# Patient Record
Sex: Female | Born: 1940 | Race: White | Hispanic: No | Smoking: Former smoker
Health system: Southern US, Community
[De-identification: ages and names within clinical notes are randomized; demographics above are authoritative.]

## PROBLEM LIST (undated history)

## (undated) DIAGNOSIS — S82841A Displaced bimalleolar fracture of right lower leg, initial encounter for closed fracture: Secondary | ICD-10-CM

## (undated) DIAGNOSIS — K5792 Diverticulitis of intestine, part unspecified, without perforation or abscess without bleeding: Secondary | ICD-10-CM

## (undated) DIAGNOSIS — G459 Transient cerebral ischemic attack, unspecified: Secondary | ICD-10-CM

## (undated) DIAGNOSIS — A0472 Enterocolitis due to Clostridium difficile, not specified as recurrent: Secondary | ICD-10-CM

## (undated) DIAGNOSIS — M1990 Unspecified osteoarthritis, unspecified site: Secondary | ICD-10-CM

## (undated) DIAGNOSIS — Z86010 Personal history of colonic polyps: Secondary | ICD-10-CM

## (undated) DIAGNOSIS — C50919 Malignant neoplasm of unspecified site of unspecified female breast: Secondary | ICD-10-CM

## (undated) DIAGNOSIS — M8580 Other specified disorders of bone density and structure, unspecified site: Secondary | ICD-10-CM

## (undated) DIAGNOSIS — T7840XA Allergy, unspecified, initial encounter: Secondary | ICD-10-CM

## (undated) DIAGNOSIS — M19019 Primary osteoarthritis, unspecified shoulder: Secondary | ICD-10-CM

## (undated) DIAGNOSIS — K759 Inflammatory liver disease, unspecified: Secondary | ICD-10-CM

## (undated) DIAGNOSIS — F514 Sleep terrors [night terrors]: Secondary | ICD-10-CM

## (undated) DIAGNOSIS — I2510 Atherosclerotic heart disease of native coronary artery without angina pectoris: Secondary | ICD-10-CM

## (undated) DIAGNOSIS — J449 Chronic obstructive pulmonary disease, unspecified: Secondary | ICD-10-CM

## (undated) DIAGNOSIS — H8110 Benign paroxysmal vertigo, unspecified ear: Secondary | ICD-10-CM

## (undated) DIAGNOSIS — Z9981 Dependence on supplemental oxygen: Secondary | ICD-10-CM

## (undated) DIAGNOSIS — I8390 Asymptomatic varicose veins of unspecified lower extremity: Secondary | ICD-10-CM

## (undated) DIAGNOSIS — J438 Other emphysema: Secondary | ICD-10-CM

## (undated) DIAGNOSIS — I255 Ischemic cardiomyopathy: Secondary | ICD-10-CM

## (undated) DIAGNOSIS — I10 Essential (primary) hypertension: Secondary | ICD-10-CM

## (undated) DIAGNOSIS — R0902 Hypoxemia: Secondary | ICD-10-CM

## (undated) DIAGNOSIS — I639 Cerebral infarction, unspecified: Secondary | ICD-10-CM

## (undated) DIAGNOSIS — C50912 Malignant neoplasm of unspecified site of left female breast: Secondary | ICD-10-CM

## (undated) DIAGNOSIS — J45909 Unspecified asthma, uncomplicated: Secondary | ICD-10-CM

## (undated) DIAGNOSIS — K259 Gastric ulcer, unspecified as acute or chronic, without hemorrhage or perforation: Secondary | ICD-10-CM

## (undated) DIAGNOSIS — K219 Gastro-esophageal reflux disease without esophagitis: Secondary | ICD-10-CM

## (undated) DIAGNOSIS — E785 Hyperlipidemia, unspecified: Secondary | ICD-10-CM

## (undated) HISTORY — DX: Sleep terrors (night terrors): F51.4

## (undated) HISTORY — DX: Hypoxemia: R09.02

## (undated) HISTORY — DX: Chronic obstructive pulmonary disease, unspecified: J44.9

## (undated) HISTORY — DX: Unspecified osteoarthritis, unspecified site: M19.90

## (undated) HISTORY — DX: Benign paroxysmal vertigo, unspecified ear: H81.10

## (undated) HISTORY — DX: Gastric ulcer, unspecified as acute or chronic, without hemorrhage or perforation: K25.9

## (undated) HISTORY — DX: Malignant neoplasm of unspecified site of left female breast: C50.912

## (undated) HISTORY — DX: Enterocolitis due to Clostridium difficile, not specified as recurrent: A04.72

## (undated) HISTORY — DX: Cerebral infarction, unspecified: I63.9

## (undated) HISTORY — DX: Displaced bimalleolar fracture of right lower leg, initial encounter for closed fracture: S82.841A

## (undated) HISTORY — DX: Personal history of colonic polyps: Z86.010

## (undated) HISTORY — DX: Other specified disorders of bone density and structure, unspecified site: M85.80

## (undated) HISTORY — DX: Primary osteoarthritis, unspecified shoulder: M19.019

## (undated) HISTORY — DX: Diverticulitis of intestine, part unspecified, without perforation or abscess without bleeding: K57.92

## (undated) HISTORY — DX: Essential (primary) hypertension: I10

## (undated) HISTORY — DX: Atherosclerotic heart disease of native coronary artery without angina pectoris: I25.10

## (undated) HISTORY — DX: Malignant neoplasm of unspecified site of unspecified female breast: C50.919

## (undated) HISTORY — PX: ABDOMINAL HYSTERECTOMY: SHX81

## (undated) HISTORY — DX: Other emphysema: J43.8

## (undated) HISTORY — PX: NOSE SURGERY: SHX723

## (undated) HISTORY — PX: VARICOSE VEIN SURGERY: SHX832

## (undated) HISTORY — PX: OTHER SURGICAL HISTORY: SHX169

## (undated) HISTORY — DX: Hyperlipidemia, unspecified: E78.5

---
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
## 1973-10-20 HISTORY — PX: AUGMENTATION MAMMAPLASTY: SUR837

## 1979-06-21 HISTORY — PX: OTHER SURGICAL HISTORY: SHX169

## 1987-10-21 DIAGNOSIS — K759 Inflammatory liver disease, unspecified: Secondary | ICD-10-CM

## 1987-10-21 HISTORY — DX: Inflammatory liver disease, unspecified: K75.9

## 1988-10-20 HISTORY — PX: CORONARY ANGIOPLASTY: SHX604

## 1999-09-13 ENCOUNTER — Encounter: Payer: Self-pay | Admitting: Emergency Medicine

## 1999-09-14 ENCOUNTER — Encounter: Payer: Self-pay | Admitting: Cardiology

## 1999-09-14 ENCOUNTER — Inpatient Hospital Stay (HOSPITAL_COMMUNITY): Admission: EM | Admit: 1999-09-14 | Discharge: 1999-09-16 | Payer: Self-pay | Admitting: Emergency Medicine

## 1999-09-15 ENCOUNTER — Encounter: Payer: Self-pay | Admitting: Cardiology

## 2001-12-08 ENCOUNTER — Other Ambulatory Visit: Admission: RE | Admit: 2001-12-08 | Discharge: 2001-12-08 | Payer: Self-pay | Admitting: Obstetrics and Gynecology

## 2003-04-14 ENCOUNTER — Encounter: Admission: RE | Admit: 2003-04-14 | Discharge: 2003-04-14 | Payer: Self-pay | Admitting: Occupational Medicine

## 2003-04-14 ENCOUNTER — Encounter: Payer: Self-pay | Admitting: Occupational Medicine

## 2004-01-10 ENCOUNTER — Other Ambulatory Visit: Admission: RE | Admit: 2004-01-10 | Discharge: 2004-01-10 | Payer: Self-pay | Admitting: Obstetrics and Gynecology

## 2004-12-05 ENCOUNTER — Ambulatory Visit: Payer: Self-pay | Admitting: Internal Medicine

## 2005-01-09 ENCOUNTER — Encounter: Admission: RE | Admit: 2005-01-09 | Discharge: 2005-01-09 | Payer: Self-pay | Admitting: Obstetrics and Gynecology

## 2005-01-17 ENCOUNTER — Other Ambulatory Visit: Admission: RE | Admit: 2005-01-17 | Discharge: 2005-01-17 | Payer: Self-pay | Admitting: Obstetrics and Gynecology

## 2005-02-12 ENCOUNTER — Ambulatory Visit: Payer: Self-pay | Admitting: Cardiology

## 2005-02-12 ENCOUNTER — Inpatient Hospital Stay (HOSPITAL_COMMUNITY): Admission: EM | Admit: 2005-02-12 | Discharge: 2005-02-13 | Payer: Self-pay | Admitting: Emergency Medicine

## 2005-02-12 ENCOUNTER — Ambulatory Visit: Payer: Self-pay | Admitting: Internal Medicine

## 2005-02-17 ENCOUNTER — Ambulatory Visit: Payer: Self-pay

## 2005-02-26 ENCOUNTER — Ambulatory Visit: Payer: Self-pay | Admitting: Cardiology

## 2005-09-19 ENCOUNTER — Ambulatory Visit: Payer: Self-pay | Admitting: Internal Medicine

## 2005-10-20 DIAGNOSIS — Z860100 Personal history of colon polyps, unspecified: Secondary | ICD-10-CM

## 2005-10-20 HISTORY — DX: Personal history of colon polyps, unspecified: Z86.0100

## 2005-10-20 HISTORY — PX: ORIF ANKLE FRACTURE: SUR919

## 2005-10-20 HISTORY — DX: Personal history of colonic polyps: Z86.010

## 2006-01-02 ENCOUNTER — Inpatient Hospital Stay (HOSPITAL_COMMUNITY): Admission: EM | Admit: 2006-01-02 | Discharge: 2006-01-07 | Payer: Self-pay | Admitting: Emergency Medicine

## 2006-02-12 ENCOUNTER — Inpatient Hospital Stay (HOSPITAL_COMMUNITY): Admission: EM | Admit: 2006-02-12 | Discharge: 2006-02-16 | Payer: Self-pay | Admitting: Emergency Medicine

## 2006-03-13 ENCOUNTER — Ambulatory Visit (HOSPITAL_COMMUNITY): Admission: RE | Admit: 2006-03-13 | Discharge: 2006-03-13 | Payer: Self-pay | Admitting: Orthopedic Surgery

## 2006-04-03 ENCOUNTER — Ambulatory Visit: Payer: Self-pay | Admitting: Internal Medicine

## 2006-04-07 ENCOUNTER — Encounter: Admission: RE | Admit: 2006-04-07 | Discharge: 2006-04-07 | Payer: Self-pay | Admitting: Orthopedic Surgery

## 2006-05-27 ENCOUNTER — Ambulatory Visit: Payer: Self-pay | Admitting: Internal Medicine

## 2006-07-31 ENCOUNTER — Ambulatory Visit: Payer: Self-pay | Admitting: Internal Medicine

## 2006-08-04 ENCOUNTER — Ambulatory Visit: Payer: Self-pay | Admitting: Internal Medicine

## 2006-08-04 LAB — CONVERTED CEMR LAB
Basophils Absolute: 0 10*3/uL (ref 0.0–0.1)
Basophils Relative: 0.7 % (ref 0.0–1.0)
Eosinophil percent: 6 % — ABNORMAL HIGH (ref 0.0–5.0)
Glucose, Bld: 101 mg/dL — ABNORMAL HIGH (ref 70–99)
HCT: 43.1 % (ref 36.0–46.0)
Hemoglobin: 14.4 g/dL (ref 12.0–15.0)
LDL Cholesterol: 121 mg/dL — ABNORMAL HIGH (ref 0–99)
Monocytes Absolute: 0.4 10*3/uL (ref 0.2–0.7)
Neutro Abs: 2.9 10*3/uL (ref 1.4–7.7)
Neutrophils Relative %: 55 % (ref 43.0–77.0)
Platelets: 227 10*3/uL (ref 150–400)
RBC: 4.65 M/uL (ref 3.87–5.11)
Triglyceride fasting, serum: 89 mg/dL (ref 0–149)

## 2006-08-18 ENCOUNTER — Ambulatory Visit: Payer: Self-pay | Admitting: Gastroenterology

## 2006-09-23 ENCOUNTER — Encounter (INDEPENDENT_AMBULATORY_CARE_PROVIDER_SITE_OTHER): Payer: Self-pay | Admitting: Specialist

## 2006-09-23 ENCOUNTER — Ambulatory Visit: Payer: Self-pay | Admitting: Gastroenterology

## 2006-11-13 DIAGNOSIS — Z87891 Personal history of nicotine dependence: Secondary | ICD-10-CM | POA: Insufficient documentation

## 2006-11-13 DIAGNOSIS — S82899A Other fracture of unspecified lower leg, initial encounter for closed fracture: Secondary | ICD-10-CM | POA: Insufficient documentation

## 2006-11-13 DIAGNOSIS — M899 Disorder of bone, unspecified: Secondary | ICD-10-CM | POA: Insufficient documentation

## 2006-11-13 DIAGNOSIS — L089 Local infection of the skin and subcutaneous tissue, unspecified: Secondary | ICD-10-CM | POA: Insufficient documentation

## 2006-11-13 DIAGNOSIS — I2510 Atherosclerotic heart disease of native coronary artery without angina pectoris: Secondary | ICD-10-CM | POA: Insufficient documentation

## 2006-11-13 DIAGNOSIS — M949 Disorder of cartilage, unspecified: Secondary | ICD-10-CM

## 2006-11-13 DIAGNOSIS — I252 Old myocardial infarction: Secondary | ICD-10-CM | POA: Insufficient documentation

## 2006-12-16 ENCOUNTER — Encounter: Payer: Self-pay | Admitting: Internal Medicine

## 2007-02-23 ENCOUNTER — Ambulatory Visit: Payer: Self-pay | Admitting: Cardiology

## 2007-03-05 ENCOUNTER — Ambulatory Visit: Payer: Self-pay

## 2007-03-05 LAB — CONVERTED CEMR LAB
ALT: 27 units/L (ref 0–40)
AST: 24 units/L (ref 0–37)
Albumin: 3.7 g/dL (ref 3.5–5.2)
Alkaline Phosphatase: 84 units/L (ref 39–117)
CO2: 27 meq/L (ref 19–32)
Calcium: 8.9 mg/dL (ref 8.4–10.5)
GFR calc non Af Amer: 106 mL/min
LDL Cholesterol: 81 mg/dL (ref 0–99)
Potassium: 3.9 meq/L (ref 3.5–5.1)
Pro B Natriuretic peptide (BNP): 47 pg/mL (ref 0.0–100.0)
Sodium: 141 meq/L (ref 135–145)
TSH: 0.55 microintl units/mL (ref 0.35–5.50)
Total Protein: 6.1 g/dL (ref 6.0–8.3)
VLDL: 10 mg/dL (ref 0–40)

## 2007-06-08 ENCOUNTER — Telehealth (INDEPENDENT_AMBULATORY_CARE_PROVIDER_SITE_OTHER): Payer: Self-pay | Admitting: *Deleted

## 2007-06-11 ENCOUNTER — Ambulatory Visit: Payer: Self-pay | Admitting: Internal Medicine

## 2007-06-11 DIAGNOSIS — L0291 Cutaneous abscess, unspecified: Secondary | ICD-10-CM

## 2007-06-11 DIAGNOSIS — L0390 Cellulitis, unspecified: Secondary | ICD-10-CM

## 2007-06-11 DIAGNOSIS — B957 Other staphylococcus as the cause of diseases classified elsewhere: Secondary | ICD-10-CM | POA: Insufficient documentation

## 2007-06-12 ENCOUNTER — Inpatient Hospital Stay (HOSPITAL_COMMUNITY): Admission: RE | Admit: 2007-06-12 | Discharge: 2007-06-13 | Payer: Self-pay | Admitting: General Surgery

## 2007-07-07 ENCOUNTER — Encounter: Payer: Self-pay | Admitting: Internal Medicine

## 2007-07-07 ENCOUNTER — Ambulatory Visit: Payer: Self-pay | Admitting: Internal Medicine

## 2007-07-16 ENCOUNTER — Encounter (INDEPENDENT_AMBULATORY_CARE_PROVIDER_SITE_OTHER): Payer: Self-pay | Admitting: *Deleted

## 2007-10-11 ENCOUNTER — Ambulatory Visit: Payer: Self-pay | Admitting: Internal Medicine

## 2007-10-11 DIAGNOSIS — J449 Chronic obstructive pulmonary disease, unspecified: Secondary | ICD-10-CM

## 2007-10-12 ENCOUNTER — Telehealth (INDEPENDENT_AMBULATORY_CARE_PROVIDER_SITE_OTHER): Payer: Self-pay | Admitting: *Deleted

## 2007-12-03 ENCOUNTER — Ambulatory Visit: Payer: Self-pay | Admitting: Internal Medicine

## 2007-12-03 DIAGNOSIS — H60399 Other infective otitis externa, unspecified ear: Secondary | ICD-10-CM | POA: Insufficient documentation

## 2008-01-11 ENCOUNTER — Encounter: Admission: RE | Admit: 2008-01-11 | Discharge: 2008-01-11 | Payer: Self-pay | Admitting: Obstetrics & Gynecology

## 2008-01-24 ENCOUNTER — Telehealth (INDEPENDENT_AMBULATORY_CARE_PROVIDER_SITE_OTHER): Payer: Self-pay | Admitting: *Deleted

## 2008-01-31 ENCOUNTER — Telehealth (INDEPENDENT_AMBULATORY_CARE_PROVIDER_SITE_OTHER): Payer: Self-pay | Admitting: *Deleted

## 2008-02-04 ENCOUNTER — Ambulatory Visit: Payer: Self-pay | Admitting: Internal Medicine

## 2008-03-10 ENCOUNTER — Encounter: Payer: Self-pay | Admitting: Internal Medicine

## 2008-04-20 ENCOUNTER — Encounter: Payer: Self-pay | Admitting: Emergency Medicine

## 2008-04-21 ENCOUNTER — Inpatient Hospital Stay (HOSPITAL_COMMUNITY): Admission: EM | Admit: 2008-04-21 | Discharge: 2008-04-22 | Payer: Self-pay | Admitting: Neurology

## 2008-04-21 ENCOUNTER — Encounter (INDEPENDENT_AMBULATORY_CARE_PROVIDER_SITE_OTHER): Payer: Self-pay | Admitting: Neurology

## 2008-04-24 ENCOUNTER — Encounter (INDEPENDENT_AMBULATORY_CARE_PROVIDER_SITE_OTHER): Payer: Self-pay | Admitting: Neurology

## 2008-04-24 ENCOUNTER — Ambulatory Visit: Payer: Self-pay | Admitting: Cardiology

## 2008-04-24 ENCOUNTER — Ambulatory Visit (HOSPITAL_COMMUNITY): Admission: RE | Admit: 2008-04-24 | Discharge: 2008-04-24 | Payer: Self-pay | Admitting: Neurology

## 2008-06-05 ENCOUNTER — Ambulatory Visit: Payer: Self-pay

## 2008-06-07 ENCOUNTER — Ambulatory Visit: Payer: Self-pay | Admitting: Cardiovascular Disease

## 2008-06-07 ENCOUNTER — Ambulatory Visit (HOSPITAL_COMMUNITY): Admission: RE | Admit: 2008-06-07 | Discharge: 2008-06-07 | Payer: Self-pay | Admitting: Cardiovascular Disease

## 2008-06-08 ENCOUNTER — Ambulatory Visit (HOSPITAL_COMMUNITY): Admission: RE | Admit: 2008-06-08 | Discharge: 2008-06-08 | Payer: Self-pay | Admitting: Cardiovascular Disease

## 2008-06-13 ENCOUNTER — Ambulatory Visit (HOSPITAL_COMMUNITY): Admission: RE | Admit: 2008-06-13 | Discharge: 2008-06-13 | Payer: Self-pay | Admitting: Cardiovascular Disease

## 2008-06-13 ENCOUNTER — Ambulatory Visit: Payer: Self-pay | Admitting: Cardiovascular Disease

## 2008-06-14 ENCOUNTER — Ambulatory Visit: Payer: Self-pay | Admitting: Cardiology

## 2008-06-14 ENCOUNTER — Ambulatory Visit: Payer: Self-pay | Admitting: Cardiovascular Disease

## 2008-06-20 ENCOUNTER — Encounter: Payer: Self-pay | Admitting: Internal Medicine

## 2008-06-20 ENCOUNTER — Ambulatory Visit: Payer: Self-pay

## 2008-06-20 ENCOUNTER — Encounter: Payer: Self-pay | Admitting: Cardiovascular Disease

## 2008-08-11 ENCOUNTER — Telehealth: Payer: Self-pay | Admitting: Internal Medicine

## 2008-08-21 ENCOUNTER — Telehealth (INDEPENDENT_AMBULATORY_CARE_PROVIDER_SITE_OTHER): Payer: Self-pay | Admitting: *Deleted

## 2008-08-21 ENCOUNTER — Inpatient Hospital Stay (HOSPITAL_COMMUNITY): Admission: EM | Admit: 2008-08-21 | Discharge: 2008-08-25 | Payer: Self-pay | Admitting: Emergency Medicine

## 2008-08-21 ENCOUNTER — Ambulatory Visit: Payer: Self-pay | Admitting: Internal Medicine

## 2008-08-23 ENCOUNTER — Ambulatory Visit: Payer: Self-pay | Admitting: Internal Medicine

## 2008-08-23 ENCOUNTER — Encounter: Payer: Self-pay | Admitting: Internal Medicine

## 2008-08-24 ENCOUNTER — Encounter: Payer: Self-pay | Admitting: Internal Medicine

## 2008-08-25 ENCOUNTER — Encounter: Payer: Self-pay | Admitting: Internal Medicine

## 2008-09-01 ENCOUNTER — Telehealth (INDEPENDENT_AMBULATORY_CARE_PROVIDER_SITE_OTHER): Payer: Self-pay | Admitting: *Deleted

## 2008-09-01 ENCOUNTER — Ambulatory Visit: Payer: Self-pay | Admitting: Internal Medicine

## 2008-09-01 DIAGNOSIS — Z8719 Personal history of other diseases of the digestive system: Secondary | ICD-10-CM

## 2008-09-01 DIAGNOSIS — B379 Candidiasis, unspecified: Secondary | ICD-10-CM | POA: Insufficient documentation

## 2008-09-01 DIAGNOSIS — Z8679 Personal history of other diseases of the circulatory system: Secondary | ICD-10-CM | POA: Insufficient documentation

## 2008-09-01 DIAGNOSIS — M19019 Primary osteoarthritis, unspecified shoulder: Secondary | ICD-10-CM | POA: Insufficient documentation

## 2008-09-01 DIAGNOSIS — K259 Gastric ulcer, unspecified as acute or chronic, without hemorrhage or perforation: Secondary | ICD-10-CM | POA: Insufficient documentation

## 2008-09-06 ENCOUNTER — Encounter (INDEPENDENT_AMBULATORY_CARE_PROVIDER_SITE_OTHER): Payer: Self-pay | Admitting: *Deleted

## 2008-09-15 ENCOUNTER — Encounter (INDEPENDENT_AMBULATORY_CARE_PROVIDER_SITE_OTHER): Payer: Self-pay | Admitting: *Deleted

## 2008-09-15 DIAGNOSIS — Z860100 Personal history of colon polyps, unspecified: Secondary | ICD-10-CM | POA: Insufficient documentation

## 2008-09-15 DIAGNOSIS — K5730 Diverticulosis of large intestine without perforation or abscess without bleeding: Secondary | ICD-10-CM | POA: Insufficient documentation

## 2008-09-15 DIAGNOSIS — K251 Acute gastric ulcer with perforation: Secondary | ICD-10-CM | POA: Insufficient documentation

## 2008-09-19 ENCOUNTER — Ambulatory Visit: Payer: Self-pay | Admitting: Internal Medicine

## 2008-09-21 ENCOUNTER — Ambulatory Visit: Payer: Self-pay | Admitting: Internal Medicine

## 2008-09-21 DIAGNOSIS — J441 Chronic obstructive pulmonary disease with (acute) exacerbation: Secondary | ICD-10-CM | POA: Insufficient documentation

## 2008-09-22 ENCOUNTER — Ambulatory Visit: Payer: Self-pay | Admitting: Gastroenterology

## 2008-10-17 ENCOUNTER — Telehealth: Payer: Self-pay | Admitting: Gastroenterology

## 2008-10-20 HISTORY — PX: CORONARY ANGIOPLASTY WITH STENT PLACEMENT: SHX49

## 2008-10-31 ENCOUNTER — Telehealth: Payer: Self-pay | Admitting: Gastroenterology

## 2008-11-01 ENCOUNTER — Ambulatory Visit: Payer: Self-pay | Admitting: Gastroenterology

## 2008-11-01 DIAGNOSIS — A09 Infectious gastroenteritis and colitis, unspecified: Secondary | ICD-10-CM | POA: Insufficient documentation

## 2008-11-01 DIAGNOSIS — R197 Diarrhea, unspecified: Secondary | ICD-10-CM

## 2008-11-01 DIAGNOSIS — R1084 Generalized abdominal pain: Secondary | ICD-10-CM | POA: Insufficient documentation

## 2008-11-01 LAB — CONVERTED CEMR LAB
BUN: 36 mg/dL — ABNORMAL HIGH (ref 6–23)
Basophils Absolute: 0 10*3/uL (ref 0.0–0.1)
Basophils Relative: 0.6 % (ref 0.0–3.0)
CO2: 30 meq/L (ref 19–32)
Creatinine, Ser: 0.9 mg/dL (ref 0.4–1.2)
GFR calc non Af Amer: 66 mL/min
Glucose, Bld: 102 mg/dL — ABNORMAL HIGH (ref 70–99)
HCT: 42.1 % (ref 36.0–46.0)
Monocytes Relative: 7.9 % (ref 3.0–12.0)
Neutro Abs: 5.2 10*3/uL (ref 1.4–7.7)
Neutrophils Relative %: 67.4 % (ref 43.0–77.0)
Potassium: 5 meq/L (ref 3.5–5.1)
Sodium: 142 meq/L (ref 135–145)

## 2008-11-10 ENCOUNTER — Encounter: Payer: Self-pay | Admitting: Physician Assistant

## 2008-11-29 ENCOUNTER — Encounter (INDEPENDENT_AMBULATORY_CARE_PROVIDER_SITE_OTHER): Payer: Self-pay | Admitting: *Deleted

## 2009-04-24 ENCOUNTER — Telehealth: Payer: Self-pay | Admitting: Cardiology

## 2009-04-24 ENCOUNTER — Inpatient Hospital Stay (HOSPITAL_COMMUNITY): Admission: EM | Admit: 2009-04-24 | Discharge: 2009-04-28 | Payer: Self-pay | Admitting: Emergency Medicine

## 2009-04-24 ENCOUNTER — Ambulatory Visit: Payer: Self-pay | Admitting: Cardiovascular Disease

## 2009-04-25 ENCOUNTER — Encounter: Payer: Self-pay | Admitting: Cardiovascular Disease

## 2009-04-26 ENCOUNTER — Encounter: Payer: Self-pay | Admitting: Cardiology

## 2009-04-26 ENCOUNTER — Ambulatory Visit: Payer: Self-pay | Admitting: Cardiovascular Disease

## 2009-04-26 ENCOUNTER — Ambulatory Visit: Payer: Self-pay | Admitting: Surgery

## 2009-04-27 ENCOUNTER — Other Ambulatory Visit: Payer: Self-pay | Admitting: Cardiovascular Disease

## 2009-04-27 ENCOUNTER — Encounter: Payer: Self-pay | Admitting: Cardiology

## 2009-04-28 ENCOUNTER — Encounter: Payer: Self-pay | Admitting: Cardiology

## 2009-04-28 ENCOUNTER — Other Ambulatory Visit: Payer: Self-pay | Admitting: Cardiovascular Disease

## 2009-04-30 ENCOUNTER — Telehealth: Payer: Self-pay | Admitting: Cardiology

## 2009-05-03 ENCOUNTER — Encounter: Payer: Self-pay | Admitting: Cardiology

## 2009-05-03 ENCOUNTER — Encounter (HOSPITAL_COMMUNITY): Admission: RE | Admit: 2009-05-03 | Discharge: 2009-08-01 | Payer: Self-pay | Admitting: Cardiology

## 2009-05-07 ENCOUNTER — Telehealth (INDEPENDENT_AMBULATORY_CARE_PROVIDER_SITE_OTHER): Payer: Self-pay | Admitting: *Deleted

## 2009-05-07 ENCOUNTER — Ambulatory Visit: Payer: Self-pay | Admitting: Cardiology

## 2009-05-07 DIAGNOSIS — E785 Hyperlipidemia, unspecified: Secondary | ICD-10-CM

## 2009-05-08 ENCOUNTER — Encounter: Admission: RE | Admit: 2009-05-08 | Discharge: 2009-05-08 | Payer: Self-pay | Admitting: Internal Medicine

## 2009-05-09 ENCOUNTER — Encounter: Payer: Self-pay | Admitting: Cardiology

## 2009-05-09 ENCOUNTER — Telehealth: Payer: Self-pay | Admitting: Cardiology

## 2009-05-14 ENCOUNTER — Telehealth (INDEPENDENT_AMBULATORY_CARE_PROVIDER_SITE_OTHER): Payer: Self-pay | Admitting: *Deleted

## 2009-05-17 ENCOUNTER — Telehealth: Payer: Self-pay | Admitting: Cardiology

## 2009-05-21 ENCOUNTER — Telehealth (INDEPENDENT_AMBULATORY_CARE_PROVIDER_SITE_OTHER): Payer: Self-pay | Admitting: *Deleted

## 2009-05-23 ENCOUNTER — Encounter: Payer: Self-pay | Admitting: Internal Medicine

## 2009-06-07 ENCOUNTER — Encounter: Payer: Self-pay | Admitting: Cardiology

## 2009-06-18 ENCOUNTER — Telehealth (INDEPENDENT_AMBULATORY_CARE_PROVIDER_SITE_OTHER): Payer: Self-pay | Admitting: *Deleted

## 2009-06-28 ENCOUNTER — Telehealth: Payer: Self-pay | Admitting: Cardiology

## 2009-07-24 ENCOUNTER — Ambulatory Visit: Payer: Self-pay | Admitting: Cardiology

## 2009-07-24 DIAGNOSIS — I6350 Cerebral infarction due to unspecified occlusion or stenosis of unspecified cerebral artery: Secondary | ICD-10-CM | POA: Insufficient documentation

## 2009-07-25 ENCOUNTER — Ambulatory Visit: Payer: Self-pay | Admitting: Pulmonary Disease

## 2009-07-25 ENCOUNTER — Encounter: Payer: Self-pay | Admitting: Pulmonary Disease

## 2009-07-25 DIAGNOSIS — R0902 Hypoxemia: Secondary | ICD-10-CM | POA: Insufficient documentation

## 2009-08-08 ENCOUNTER — Encounter (HOSPITAL_COMMUNITY): Admission: RE | Admit: 2009-08-08 | Discharge: 2009-10-17 | Payer: Self-pay | Admitting: Cardiology

## 2009-08-10 ENCOUNTER — Telehealth (INDEPENDENT_AMBULATORY_CARE_PROVIDER_SITE_OTHER): Payer: Self-pay | Admitting: *Deleted

## 2009-08-21 ENCOUNTER — Encounter: Payer: Self-pay | Admitting: Pulmonary Disease

## 2009-08-27 ENCOUNTER — Telehealth (INDEPENDENT_AMBULATORY_CARE_PROVIDER_SITE_OTHER): Payer: Self-pay | Admitting: *Deleted

## 2009-08-27 ENCOUNTER — Ambulatory Visit: Payer: Self-pay | Admitting: Pulmonary Disease

## 2009-08-27 LAB — CONVERTED CEMR LAB
Albumin: 3.7 g/dL (ref 3.5–5.2)
Alkaline Phosphatase: 80 units/L (ref 39–117)
BUN: 16 mg/dL (ref 6–23)
Basophils Relative: 0.5 % (ref 0.0–3.0)
Calcium: 9.7 mg/dL (ref 8.4–10.5)
Chloride: 104 meq/L (ref 96–112)
Creatinine, Ser: 1 mg/dL (ref 0.4–1.2)
Eosinophils Absolute: 0.5 10*3/uL (ref 0.0–0.7)
Eosinophils Relative: 5.7 % — ABNORMAL HIGH (ref 0.0–5.0)
GFR calc non Af Amer: 58.51 mL/min (ref >60)
Hemoglobin: 13.8 g/dL (ref 12.0–15.0)
Lymphocytes Relative: 19.9 % (ref 12.0–46.0)
Lymphs Abs: 1.6 10*3/uL (ref 0.7–4.0)
MCV: 93.6 fL (ref 78.0–100.0)
Monocytes Absolute: 0.8 10*3/uL (ref 0.1–1.0)
Neutro Abs: 5.2 10*3/uL (ref 1.4–7.7)
Neutrophils Relative %: 64.3 % (ref 43.0–77.0)
Potassium: 4.3 meq/L (ref 3.5–5.1)
RBC: 4.29 M/uL (ref 3.87–5.11)
RDW: 11.6 % (ref 11.5–14.6)
Sodium: 142 meq/L (ref 135–145)
Total Bilirubin: 0.8 mg/dL (ref 0.3–1.2)

## 2009-08-29 ENCOUNTER — Telehealth (INDEPENDENT_AMBULATORY_CARE_PROVIDER_SITE_OTHER): Payer: Self-pay | Admitting: *Deleted

## 2009-08-30 ENCOUNTER — Encounter: Payer: Self-pay | Admitting: Pulmonary Disease

## 2009-09-05 ENCOUNTER — Encounter: Payer: Self-pay | Admitting: Cardiology

## 2009-09-20 ENCOUNTER — Encounter: Payer: Self-pay | Admitting: Internal Medicine

## 2009-09-27 ENCOUNTER — Telehealth: Payer: Self-pay | Admitting: Pulmonary Disease

## 2009-10-03 ENCOUNTER — Telehealth (INDEPENDENT_AMBULATORY_CARE_PROVIDER_SITE_OTHER): Payer: Self-pay | Admitting: *Deleted

## 2009-10-10 ENCOUNTER — Telehealth: Payer: Self-pay | Admitting: Pulmonary Disease

## 2009-10-24 ENCOUNTER — Ambulatory Visit: Payer: Self-pay | Admitting: Internal Medicine

## 2009-11-04 ENCOUNTER — Inpatient Hospital Stay (HOSPITAL_COMMUNITY): Admission: EM | Admit: 2009-11-04 | Discharge: 2009-11-08 | Payer: Self-pay | Admitting: Emergency Medicine

## 2009-11-05 ENCOUNTER — Encounter (INDEPENDENT_AMBULATORY_CARE_PROVIDER_SITE_OTHER): Payer: Self-pay | Admitting: *Deleted

## 2009-11-07 ENCOUNTER — Telehealth: Payer: Self-pay | Admitting: Gastroenterology

## 2009-11-09 ENCOUNTER — Ambulatory Visit: Payer: Self-pay | Admitting: Pulmonary Disease

## 2009-11-15 ENCOUNTER — Telehealth: Payer: Self-pay | Admitting: Pulmonary Disease

## 2009-11-15 ENCOUNTER — Ambulatory Visit: Payer: Self-pay | Admitting: Gastroenterology

## 2009-11-15 DIAGNOSIS — K5732 Diverticulitis of large intestine without perforation or abscess without bleeding: Secondary | ICD-10-CM

## 2009-11-26 ENCOUNTER — Encounter: Payer: Self-pay | Admitting: Pulmonary Disease

## 2009-11-28 ENCOUNTER — Encounter (INDEPENDENT_AMBULATORY_CARE_PROVIDER_SITE_OTHER): Payer: Self-pay | Admitting: *Deleted

## 2009-11-28 ENCOUNTER — Ambulatory Visit: Payer: Self-pay | Admitting: Cardiology

## 2009-11-28 ENCOUNTER — Encounter: Payer: Self-pay | Admitting: Internal Medicine

## 2009-11-28 DIAGNOSIS — R0602 Shortness of breath: Secondary | ICD-10-CM

## 2009-11-29 ENCOUNTER — Telehealth: Payer: Self-pay | Admitting: Cardiology

## 2009-11-29 ENCOUNTER — Ambulatory Visit: Payer: Self-pay | Admitting: Cardiology

## 2009-11-29 DIAGNOSIS — R079 Chest pain, unspecified: Secondary | ICD-10-CM

## 2009-11-30 LAB — CONVERTED CEMR LAB
BUN: 25 mg/dL — ABNORMAL HIGH (ref 6–23)
Basophils Absolute: 0 10*3/uL (ref 0.0–0.1)
Basophils Relative: 0 % (ref 0.0–3.0)
Glucose, Bld: 99 mg/dL (ref 70–99)
Hemoglobin: 13.8 g/dL (ref 12.0–15.0)
Monocytes Relative: 4.8 % (ref 3.0–12.0)
Neutro Abs: 5 10*3/uL (ref 1.4–7.7)
Neutrophils Relative %: 67.3 % (ref 43.0–77.0)
RBC: 4.45 M/uL (ref 3.87–5.11)
RDW: 12.2 % (ref 11.5–14.6)

## 2009-12-04 ENCOUNTER — Ambulatory Visit: Admission: RE | Admit: 2009-12-04 | Payer: Self-pay | Source: Home / Self Care | Admitting: Cardiology

## 2009-12-04 ENCOUNTER — Ambulatory Visit: Payer: Self-pay | Admitting: Cardiology

## 2009-12-12 ENCOUNTER — Ambulatory Visit: Payer: Self-pay | Admitting: Pulmonary Disease

## 2009-12-12 ENCOUNTER — Telehealth: Payer: Self-pay | Admitting: Pulmonary Disease

## 2009-12-13 ENCOUNTER — Telehealth: Payer: Self-pay | Admitting: Cardiology

## 2009-12-14 ENCOUNTER — Ambulatory Visit: Payer: Self-pay | Admitting: Gastroenterology

## 2009-12-27 ENCOUNTER — Ambulatory Visit: Payer: Self-pay | Admitting: Pulmonary Disease

## 2009-12-27 ENCOUNTER — Telehealth: Payer: Self-pay | Admitting: Pulmonary Disease

## 2009-12-29 ENCOUNTER — Telehealth: Payer: Self-pay | Admitting: Pulmonary Disease

## 2010-01-03 ENCOUNTER — Telehealth (INDEPENDENT_AMBULATORY_CARE_PROVIDER_SITE_OTHER): Payer: Self-pay | Admitting: *Deleted

## 2010-01-04 ENCOUNTER — Telehealth (INDEPENDENT_AMBULATORY_CARE_PROVIDER_SITE_OTHER): Payer: Self-pay | Admitting: *Deleted

## 2010-01-04 ENCOUNTER — Encounter: Payer: Self-pay | Admitting: Internal Medicine

## 2010-01-15 ENCOUNTER — Ambulatory Visit: Payer: Self-pay | Admitting: Cardiology

## 2010-01-15 DIAGNOSIS — I2510 Atherosclerotic heart disease of native coronary artery without angina pectoris: Secondary | ICD-10-CM

## 2010-01-16 ENCOUNTER — Ambulatory Visit: Payer: Self-pay | Admitting: Internal Medicine

## 2010-01-16 ENCOUNTER — Telehealth: Payer: Self-pay | Admitting: Cardiology

## 2010-01-16 ENCOUNTER — Ambulatory Visit: Payer: Self-pay | Admitting: Pulmonary Disease

## 2010-01-17 LAB — CONVERTED CEMR LAB: Vit D, 25-Hydroxy: 45 ng/mL (ref 30–89)

## 2010-01-21 ENCOUNTER — Telehealth (INDEPENDENT_AMBULATORY_CARE_PROVIDER_SITE_OTHER): Payer: Self-pay | Admitting: *Deleted

## 2010-01-28 ENCOUNTER — Telehealth: Payer: Self-pay | Admitting: Pulmonary Disease

## 2010-02-05 ENCOUNTER — Telehealth: Payer: Self-pay | Admitting: Pulmonary Disease

## 2010-03-25 ENCOUNTER — Ambulatory Visit: Payer: Self-pay | Admitting: Pulmonary Disease

## 2010-03-25 DIAGNOSIS — J209 Acute bronchitis, unspecified: Secondary | ICD-10-CM

## 2010-03-25 DIAGNOSIS — J438 Other emphysema: Secondary | ICD-10-CM | POA: Insufficient documentation

## 2010-04-01 ENCOUNTER — Telehealth (INDEPENDENT_AMBULATORY_CARE_PROVIDER_SITE_OTHER): Payer: Self-pay | Admitting: *Deleted

## 2010-04-03 ENCOUNTER — Ambulatory Visit: Payer: Self-pay | Admitting: Pulmonary Disease

## 2010-05-14 ENCOUNTER — Ambulatory Visit: Payer: Self-pay | Admitting: Cardiology

## 2010-05-16 ENCOUNTER — Telehealth: Payer: Self-pay | Admitting: Gastroenterology

## 2010-05-21 ENCOUNTER — Observation Stay (HOSPITAL_COMMUNITY): Admission: EM | Admit: 2010-05-21 | Discharge: 2010-05-23 | Payer: Self-pay | Admitting: Emergency Medicine

## 2010-05-21 ENCOUNTER — Ambulatory Visit: Payer: Self-pay | Admitting: Surgery

## 2010-05-21 ENCOUNTER — Encounter (INDEPENDENT_AMBULATORY_CARE_PROVIDER_SITE_OTHER): Payer: Self-pay | Admitting: Internal Medicine

## 2010-05-21 ENCOUNTER — Ambulatory Visit: Payer: Self-pay | Admitting: Cardiovascular Disease

## 2010-05-22 ENCOUNTER — Encounter (INDEPENDENT_AMBULATORY_CARE_PROVIDER_SITE_OTHER): Payer: Self-pay | Admitting: Internal Medicine

## 2010-05-23 ENCOUNTER — Encounter (INDEPENDENT_AMBULATORY_CARE_PROVIDER_SITE_OTHER): Payer: Self-pay | Admitting: *Deleted

## 2010-05-24 ENCOUNTER — Telehealth (INDEPENDENT_AMBULATORY_CARE_PROVIDER_SITE_OTHER): Payer: Self-pay | Admitting: *Deleted

## 2010-05-24 DIAGNOSIS — H8110 Benign paroxysmal vertigo, unspecified ear: Secondary | ICD-10-CM

## 2010-05-31 ENCOUNTER — Telehealth: Payer: Self-pay | Admitting: Cardiology

## 2010-06-04 ENCOUNTER — Telehealth: Payer: Self-pay | Admitting: Cardiology

## 2010-06-11 ENCOUNTER — Encounter: Admission: RE | Admit: 2010-06-11 | Discharge: 2010-06-11 | Payer: Self-pay | Admitting: Internal Medicine

## 2010-06-26 ENCOUNTER — Encounter: Payer: Self-pay | Admitting: Internal Medicine

## 2010-07-26 ENCOUNTER — Telehealth (INDEPENDENT_AMBULATORY_CARE_PROVIDER_SITE_OTHER): Payer: Self-pay | Admitting: *Deleted

## 2010-07-30 ENCOUNTER — Telehealth: Payer: Self-pay | Admitting: Pulmonary Disease

## 2010-08-01 ENCOUNTER — Encounter: Payer: Self-pay | Admitting: Internal Medicine

## 2010-08-16 ENCOUNTER — Encounter: Payer: Self-pay | Admitting: Internal Medicine

## 2010-08-23 ENCOUNTER — Telehealth: Payer: Self-pay | Admitting: Cardiology

## 2010-08-30 ENCOUNTER — Ambulatory Visit: Payer: Self-pay | Admitting: Pulmonary Disease

## 2010-09-16 ENCOUNTER — Telehealth: Payer: Self-pay | Admitting: Gastroenterology

## 2010-09-17 ENCOUNTER — Ambulatory Visit: Payer: Self-pay | Admitting: Gastroenterology

## 2010-09-17 ENCOUNTER — Ambulatory Visit: Payer: Self-pay | Admitting: Cardiology

## 2010-09-17 DIAGNOSIS — R1032 Left lower quadrant pain: Secondary | ICD-10-CM

## 2010-09-17 LAB — CONVERTED CEMR LAB
ALT: 20 units/L (ref 0–35)
AST: 19 units/L (ref 0–37)
Albumin: 3.7 g/dL (ref 3.5–5.2)
Basophils Relative: 0.4 % (ref 0.0–3.0)
Bilirubin, Direct: 0.2 mg/dL (ref 0.0–0.3)
CO2: 25 meq/L (ref 19–32)
CRP, High Sensitivity: 151.38 — ABNORMAL HIGH (ref 0.00–5.00)
Calcium: 9.1 mg/dL (ref 8.4–10.5)
Creatinine, Ser: 0.8 mg/dL (ref 0.4–1.2)
GFR calc non Af Amer: 81.3 mL/min (ref >60)
Glucose, Bld: 96 mg/dL (ref 70–99)
Hemoglobin, Urine: NEGATIVE
Hemoglobin: 13.7 g/dL (ref 12.0–15.0)
Iron: 27 ug/dL — ABNORMAL LOW (ref 42–145)
Leukocytes, UA: NEGATIVE
MCHC: 34.4 g/dL (ref 30.0–36.0)
MCV: 92.8 fL (ref 78.0–100.0)
Neutro Abs: 9.8 10*3/uL — ABNORMAL HIGH (ref 1.4–7.7)
Neutrophils Relative %: 79.9 % — ABNORMAL HIGH (ref 43.0–77.0)
Nitrite: NEGATIVE
Platelets: 208 10*3/uL (ref 150.0–400.0)
Potassium: 4.2 meq/L (ref 3.5–5.1)
RBC: 4.3 M/uL (ref 3.87–5.11)
RDW: 12.7 % (ref 11.5–14.6)
Sed Rate: 41 mm/hr — ABNORMAL HIGH (ref 0–22)
Specific Gravity, Urine: 1.025 (ref 1.000–1.030)
Total Bilirubin: 0.6 mg/dL (ref 0.3–1.2)
Total Protein: 6.3 g/dL (ref 6.0–8.3)
Transferrin: 213.7 mg/dL (ref 212.0–360.0)
Urine Glucose: NEGATIVE mg/dL
Urobilinogen, UA: 0.2 (ref 0.0–1.0)
Vitamin B-12: 949 pg/mL — ABNORMAL HIGH (ref 211–911)
WBC: 12.3 10*3/uL — ABNORMAL HIGH (ref 4.5–10.5)
pH: 5.5 (ref 5.0–8.0)

## 2010-09-18 ENCOUNTER — Encounter: Payer: Self-pay | Admitting: Gastroenterology

## 2010-09-20 ENCOUNTER — Telehealth (INDEPENDENT_AMBULATORY_CARE_PROVIDER_SITE_OTHER): Payer: Self-pay | Admitting: *Deleted

## 2010-09-24 ENCOUNTER — Ambulatory Visit: Payer: Self-pay | Admitting: Cardiology

## 2010-09-24 ENCOUNTER — Encounter: Payer: Self-pay | Admitting: Cardiology

## 2010-09-24 DIAGNOSIS — I2589 Other forms of chronic ischemic heart disease: Secondary | ICD-10-CM | POA: Insufficient documentation

## 2010-10-06 ENCOUNTER — Emergency Department (HOSPITAL_COMMUNITY)
Admission: EM | Admit: 2010-10-06 | Discharge: 2010-10-06 | Payer: Self-pay | Source: Home / Self Care | Admitting: Emergency Medicine

## 2010-10-20 HISTORY — PX: TOTAL SHOULDER ARTHROPLASTY: SHX126

## 2010-10-24 ENCOUNTER — Ambulatory Visit (HOSPITAL_COMMUNITY)
Admission: RE | Admit: 2010-10-24 | Discharge: 2010-10-24 | Payer: Self-pay | Source: Home / Self Care | Attending: Orthopedic Surgery | Admitting: Orthopedic Surgery

## 2010-10-24 ENCOUNTER — Telehealth (INDEPENDENT_AMBULATORY_CARE_PROVIDER_SITE_OTHER): Payer: Self-pay | Admitting: *Deleted

## 2010-10-24 ENCOUNTER — Encounter: Payer: Self-pay | Admitting: Internal Medicine

## 2010-10-24 LAB — URINALYSIS, ROUTINE W REFLEX MICROSCOPIC
Bilirubin Urine: NEGATIVE
Hemoglobin, Urine: NEGATIVE
Ketones, ur: NEGATIVE mg/dL
Nitrite: NEGATIVE
Protein, ur: NEGATIVE mg/dL
Specific Gravity, Urine: 1.023 (ref 1.005–1.030)
Urine Glucose, Fasting: NEGATIVE mg/dL
Urobilinogen, UA: 0.2 mg/dL (ref 0.0–1.0)
pH: 6 (ref 5.0–8.0)

## 2010-10-24 LAB — COMPREHENSIVE METABOLIC PANEL
ALT: 29 U/L (ref 0–35)
AST: 26 U/L (ref 0–37)
Albumin: 3.5 g/dL (ref 3.5–5.2)
Alkaline Phosphatase: 128 U/L — ABNORMAL HIGH (ref 39–117)
BUN: 18 mg/dL (ref 6–23)
CO2: 27 mEq/L (ref 19–32)
Calcium: 9.3 mg/dL (ref 8.4–10.5)
Chloride: 110 mEq/L (ref 96–112)
Creatinine, Ser: 0.76 mg/dL (ref 0.4–1.2)
GFR calc Af Amer: >60 mL/min (ref >60)
GFR calc non Af Amer: >60 mL/min (ref >60)
Glucose, Bld: 166 mg/dL — ABNORMAL HIGH (ref 70–99)
Potassium: 4.1 mEq/L (ref 3.5–5.1)
Sodium: 144 mEq/L (ref 135–145)
Total Bilirubin: 0.3 mg/dL (ref 0.3–1.2)
Total Protein: 5.9 g/dL — ABNORMAL LOW (ref 6.0–8.3)

## 2010-10-24 LAB — CBC
HCT: 41.6 % (ref 36.0–46.0)
Hemoglobin: 13.1 g/dL (ref 12.0–15.0)
MCH: 29.4 pg (ref 26.0–34.0)
MCHC: 31.5 g/dL (ref 30.0–36.0)
MCV: 93.3 fL (ref 78.0–100.0)
Platelets: 149 10*3/uL — ABNORMAL LOW (ref 150–400)
RBC: 4.46 MIL/uL (ref 3.87–5.11)
RDW: 13.4 % (ref 11.5–15.5)
WBC: 6.9 10*3/uL (ref 4.0–10.5)

## 2010-10-24 LAB — APTT: aPTT: 31 seconds (ref 24–37)

## 2010-10-24 LAB — PROTIME-INR
INR: 0.99 (ref 0.00–1.49)
Prothrombin Time: 13.3 seconds (ref 11.6–15.2)

## 2010-10-25 ENCOUNTER — Telehealth (INDEPENDENT_AMBULATORY_CARE_PROVIDER_SITE_OTHER): Payer: Self-pay | Admitting: *Deleted

## 2010-10-25 ENCOUNTER — Telehealth: Payer: Self-pay | Admitting: Pulmonary Disease

## 2010-10-28 ENCOUNTER — Encounter: Payer: Self-pay | Admitting: Pulmonary Disease

## 2010-10-30 ENCOUNTER — Ambulatory Visit: Admit: 2010-10-30 | Payer: Self-pay | Admitting: Internal Medicine

## 2010-10-31 ENCOUNTER — Ambulatory Visit: Admit: 2010-10-31 | Payer: Self-pay | Admitting: Internal Medicine

## 2010-11-10 ENCOUNTER — Encounter: Payer: Self-pay | Admitting: Internal Medicine

## 2010-11-10 ENCOUNTER — Encounter: Payer: Self-pay | Admitting: Orthopedic Surgery

## 2010-11-11 ENCOUNTER — Telehealth (INDEPENDENT_AMBULATORY_CARE_PROVIDER_SITE_OTHER): Payer: Self-pay | Admitting: *Deleted

## 2010-11-19 NOTE — Progress Notes (Signed)
Summary: Shoulder injection  ---- Converted from flag ---- ---- 12/08/2009 9:03 AM, Lenoria Farrier, MD, [facility] wrote: I'll need to call her when I get back next week.  She is less than one year since DES so there is risk so I'm hopeful we can find an alternative Rx. BB  ---- 12/05/2009 12:43 PM, Sherri Rad, RN, BSN wrote: Any decision made since cath on 2/15 about Ms. Schomburg holding plavix for 5 days for shoulder injection? Thanks, Heather. ------------------------------  Phone Note Outgoing Call   Call placed by: Sherri Rad, RN, BSN,  December 13, 2009 10:07 AM Call placed to: Patient Summary of Call: (late entry) Dr. Juanda Chance attempted to call the pt last night about 6:15pm to discuss the request from Dr. Amanda Pea to hold plavix prior to shoulder injection. We will call the pt back when Dr. Juanda Chance returns to the office. Sherri Rad, RN, BSN  December 13, 2009 10:09 AM   Dr Juanda Chance spoke with the pt today. He explained to her that Plavix should not be held for a least 1 year from her intervention. The pt will discuss other alternatives with Dr. Amanda Pea for her shoulder. Per Dr. Juanda Chance, the idea holding plavix for a shoulder injection may be entertained at one year out from her procedure. The pt is agreeable.  Initial call taken by: Sherri Rad, RN, BSN,  December 13, 2009 5:57 PM

## 2010-11-19 NOTE — Assessment & Plan Note (Signed)
Summary: rov for pft results///kp   Copy to:  Dr. Charlies Constable Primary Provider/Referring Provider:  [name], MD  CC:  Followup.  Pt states that her breathing is "somewhat better".  She denies any new complaints today.Marland Kitchen  History of Present Illness: 68/F, ex smoker, realtor with Gold stg III COPD FEV1 1.0 L (48%), on O2 since 10/10 for FU. She smoked for 40 years and quit in 1999. She has been on advair, and this helps.  She used symbicort before, but felt that advair worked better. She has been on multiple rescue inhalers, and these help some. Uses husband's nebuliser with xopenex nebs . She had a cardiac stent in 7/10, on plavix.Echo 7/10 >> septal & apical akinesis, EF 45-50% but subsequent cath, stent,  EF was lower. rpt cath 2/11 (Dr Juanda Chance) RVSP 38 Desaturates to 90% on ambulation 2/11  August 27, 2009  Acute visit for 'bronchitis >> improved with avelox & low dose steroids.  November 09, 2009 3:51 PM  Accompanied by daughter Gerri Spore ER RN) who wishes to optimise her 'quality of life'.. Adm [facility] for diverticulitis, now abdominal pain resolved. Episode of chest pain in the hospital without EKG changes & neg enzymes. Recently completed cardiac rehab without much benefit. Nocturnal O2 helps.  Pro- air expensive  January 16, 2010 12:23 PM  Discussed PFTs, Prednisone helped , gained 5-6 lbs, concerned about bone loss. Discussed exercise regimen. Compliant with O2 & helps. Cannot walk with groceries in hand.     Current Medications (verified): 1)  Spiriva Handihaler 18 Mcg Caps (Tiotropium Bromide Monohydrate) .... Two Puffs Once Daily 2)  Advair Diskus 250-50 Mcg/dose  Misc (Fluticasone-Salmeterol) .Marland Kitchen.. 1 Puff Q12 Hours 3)  Proair Hfa 108 (90 Base) Mcg/act Aers (Albuterol Sulfate) .... Inhale Two Puffs Every 4 Hours As Needed 4)  Metoprolol Succinate 25 Mg Xr24h-Tab (Metoprolol Succinate) .Marland Kitchen.. 1 By Mouth Once Daily 5)  Aspirin 325 Mg Tabs (Aspirin) .... Once Daily 6)  Plavix 75  Mg Tabs (Clopidogrel Bisulfate) .... Take 1 Tablet By Mouth Once A Day 7)  Simvastatin 40 Mg  Tabs (Simvastatin) .Marland Kitchen.. 1 By Mouth At Bedtime 8)  Fish Oil 1200 Mg Caps (Omega-3 Fatty Acids) .... Once Daily 9)  Tramadol Hcl 50 Mg Tabs (Tramadol Hcl) .... One Tab At Bedtime 10)  Alendronate Sodium 70 Mg  Tabs (Alendronate Sodium) .Marland Kitchen.. 1 By Mouth Qwk 11)  Calcium With Vitamin D, K, Magnesium and Zinc .... Take 1 Tablet By Mouth  Two Times A Day 12)  Vitamin D3 2000 Unit Caps (Cholecalciferol) .... Take 1 Tablet By Mouth Two Times A Day 13)  Multivitamins  Tabs (Multiple Vitamin) .... Take 1/2  Tablet By Mouth Two Times A Day 14)  Doqlace 100 Mg .... Take 1 Tablet By Mouth Once A Day 15)  Study Drug-For Stoke .... Take 1 Tablet By Mouth Once Daily 16)  Nitroglycerin 0.4 Mg Subl (Nitroglycerin) .... As Needed 17)  L-Lysine Hcl 500 Mg Tabs (Lysine Hcl) .... Once Daily or Every Other Day 18)  Odorless Garlic 1250 Mg Tabs (Garlic) .... Once Daily 19)  Allrest .... Otc 1 Tab Once Daily 20)  Oxygen .... 2 Liters At Bedtime 21)  Celebrex 200 Mg Caps (Celecoxib) .Marland Kitchen.. 1 By Mouth Every Other Day 22)  Phillips Colon Health  Caps (Probiotic Product) .Marland Kitchen.. 1 By Mouth Daily----Hold 23)  Non-Aspirin 325 Mg Tabs (Acetaminophen) .... 2 By Mouth At Bedtime 24)  Prednisone 20 Mg Tabs (Prednisone) .... 1/2 By Mouth  Daily 25)  Urinary Tract Health 365-50 Mg Caps (Cranberry-Olive Leaf) .Marland Kitchen.. 1 Once Daily  Allergies (verified): 1)  ! Sulfa 2)  ! 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 -stopped in 1999 Alcohol Use - yes-occasion Daily Caffeine Use Illicit Drug Use - no Patient does not get regular exercise.  retired Veterinary surgeon  Review of Systems       The patient complains of weight gain and dyspnea on exertion.  The patient denies anorexia, fever, weight loss, vision loss, decreased hearing, hoarseness, chest pain, syncope, peripheral edema, prolonged cough, headaches, hemoptysis, abdominal pain, melena, hematochezia, severe indigestion/heartburn, hematuria, muscle weakness, suspicious skin lesions, transient blindness, difficulty walking, depression, unusual weight change, and abnormal bleeding.    Vital Signs:  Patient profile:   70 year old female Weight:      174.13 pounds O2 Sat:      96 % on Room air Temp:     98.0 degrees F oral Pulse rate:   70 / minute BP sitting:   122 / 70  (left arm)  Vitals Entered By: Vernie Murders (January 16, 2010 12:10 PM)  O2 Flow:  Room air  Physical Exam  Additional Exam:  Gen. Pleasant, well-nourished, in no distress ENT - no lesions, no post nasal drip Neck: No JVD, no thyromegaly, no carotid bruits Lungs: no use of accessory muscles, no dullness to percussion, clear without rales, faint rt rhonchi  Cardiovascular: Rhythm regular, heart sounds  normal, no murmurs or gallops, no peripheral edema Musculoskeletal: No deformities, no cyanosis or clubbing      Impression & Recommendations:  Problem # 1:  CHRONIC OBSTRUCTIVE PULMONARY DISEASE, ACUTE EXACERBATION (ICD-491.21)  -resolved. dc steroids Does not want to start pulmonary rehab but wil start exercises on her own - discussed different kinds of exercises & gave her broad guidelines.  Orders: Est. Patient Level III  (29528)  Problem # 2:  HYPOXEMIA (ICD-799.02)  ct O2 during sleep & as needed daytime.   Orders: Est. Patient Level III (41324)  Medications Added to Medication List This Visit: 1)  Doqlace 100 Mg  .... Take 1 tablet by mouth once a day 2)  Urinary Tract Health 365-50 Mg Caps (Cranberry-olive leaf) .Marland Kitchen.. 1 once daily  Patient Instructions: 1)  Copy sent to:Hopper 2)  Please schedule a follow-up appointment in 4 months. 3)  Good luck with your exercise program

## 2010-11-19 NOTE — Letter (Signed)
Summary: Results Follow up Letter  [place] at [facility]  [address] [place], [place] [address]   Phone: [phone]  Fax: [phone]    11/05/2009 MRN: [id number]  [name] [address] [place], [place]  [address]  Dear [name],  The following are the results of your recent test(s):  Test         Result    Pap Smear:        Normal _____  Not Normal _____ Comments: ______________________________________________________ Cholesterol: LDL(Bad cholesterol):         Your goal is less than:         HDL (Good cholesterol):       Your goal is more than: Comments:  ______________________________________________________ Mammogram:        Normal _____  Not Normal _____ Comments:  ___________________________________________________________________ Hemoccult:        Normal _____  Not normal _______ Comments:    _____________________________________________________________________ Other Tests: Please see attached Bone Density done on 10/24/2009    We routinely do not discuss normal results over the telephone.  If you desire a copy of the results, or you have any questions about this information we can discuss them at your next office visit.   Sincerely,

## 2010-11-19 NOTE — Medication Information (Signed)
Summary: [facility]  [facility]   Imported By: Lester [place] 11/28/2009 10:21:08  _____________________________________________________________________  External Attachment:    Type:   Image     Comment:   External Document

## 2010-11-19 NOTE — Progress Notes (Signed)
Summary: ASA  ---- Converted from flag ---- ---- 06/02/2010 5:09 PM, Lenoria Farrier, MD, [facility] wrote: Herbert Seta, Yes, take 81 mg ASA. BB ------------------------------  Phone Note Outgoing Call   Call placed by: Sherri Rad, RN, BSN,  June 04, 2010 9:32 AM Call placed to: Patient Summary of Call: The pt. is aware she may resume ASA 81mg  once daily. She will keep her f/u appt. in December with Dr. Juanda Chance. Initial call taken by: Sherri Rad, RN, BSN,  June 04, 2010 9:33 AM

## 2010-11-19 NOTE — Progress Notes (Signed)
Summary: QUESTION ABOUT TAKING BABY ASPIRIN  Phone Note Call from Patient Call back at Work Phone [phone]   Caller: Patient Summary of Call: PT WANT DR.BRODIE TO REVIEW HER RECORDS AND PT WANT TO KNOW IF SHE NEED TO START BACK ON HER BABY ASPIRIN AND THE PT WANT TO KNOW IF SHE NEED TO BE SEEN BY DR.BRODIE Initial call taken by: Judie Grieve,  May 31, 2010 1:23 PM  Follow-up for Phone Call        PER PT WAS HOSPITALIZED AT [place] BEGINNING OF AUGUST  WANTS DR BRODIE TO REVIEW RECORDS AND TO INFORM PT IF MAY GO BACK TO BABY ASA IS CURRENTLY TAKING 325 MG . ALSO APPT MADE FOR 09/24/10 AT 4:15 DOES SHE NEED TO BE SEEN AT AN EARLIER DATE.?  Follow-up by: Scherrie Bateman, LPN,  May 31, 2010 1:49 PM

## 2010-11-19 NOTE — Progress Notes (Signed)
Summary: ?Surgical clearance  Phone Note From Other Clinic   Caller: Medical Records Call For: [facility] Summary of Call: Dr. Vassie Loll received fax requesting medical records on pt for surgery clearance. Per RA [facility] [phone] to advise pt last seen 06/11 and need to schedule OV if pre-op needed. Bambi advised pt is having a tummy tuck and gel implants to replace silicone implants from the 1970's. I faxed our last OV note for Dr. Etter Sjogren review and their office will be in contact with [place] if they need additional information. Will forward this phone note to RA for review. Initial call taken by: Zackery Barefoot CMA,  July 30, 2010 10:41 AM

## 2010-11-19 NOTE — Discharge Summary (Signed)
[name], [name] NO.:  [id number]      MEDICAL RECORD NO.:  [id number]          PATIENT TYPE:  OBV      LOCATION:  1417                         FACILITY:  [facility]      PHYSICIAN:  Richarda Overlie, MD       DATE OF BIRTH:  09-03-1941      DATE OF ADMISSION:  05/21/2010   DATE OF DISCHARGE:  05/23/2010                                  DISCHARGE SUMMARY      PRIMARY CARE PHYSICIAN:  Janace Hoard, M.D.      NEUROLOGIST:  Pramod P. Pearlean Brownie, M.D.      GI SPECIALIST:  Hedwig Morton. Juanda Chance, M.D.      ENT:  Dorna Leitz, M.D., F.A.C.S.      DISCHARGE DIAGNOSES:   1. Benign positional vertigo.   2. Nonsustained ventricular tachycardia.   3. History of systolic heart failure currently stable.   4. Hypertension.   5. Coronary artery disease.   6. Chronic obstructive pulmonary disease.   7. Asthma.   8. History of cerebrovascular accident.   9. History of coronary artery disease and myocardial infarction in the       past.      PROCEDURES:  MRI of the brain shows no acute abnormality, small   progression of posterior circulation small vessel infarct, ICA   tortuosity and cavernous segment atherosclerosis with saccular out-   bulging of the cavernous right ICA favor to represent an atherosclerotic   serial lesion rather than genuine cavernous segment.      SUBJECTIVE:  This is a 70 year old female with a history of above   medical problems who presents to the ED with a chief complaint of   dizziness described as room spinning associated with some nausea without   any specific precipitating factors.  The patient was evaluated for   possible TIA/CVA.  She did not have any gross focal neurologic deficits   in the ED.      HOSPITAL COURSE:  Dizziness.  The patient was started on meclizine with   symptomatic improvement.  She was placed on telemetry.  Cardiac enzymes   were cycled and were found to be negative.  She did have one episode of   nonsustained ventricular  tachycardia, but her dizziness spell did not   coincide with this episode.  The patient had an MRI/MRA to rule out   posterior circulation insufficiency.  There was some progression of the   posterior circulation small vessel infarcts since 2009.  The patient   also was found to be mildly hypotensive with systolic blood pressure   close to 100 systolic as the patient does take metoprolol.  The   patient's metoprolol was held initially, but restarted after the episode   of nonsustained VT, but there were no episodes of sinus bradycardia or   pauses on her telemetry.  The patient had physical/occupational therapy   evaluation and she might benefit from outpatient vestibular rehab.  I   have advised the patient not to drive until seen by ENT specialist.  I   did call Dr. Lacretia Leigh office at [phone], requested an appointment   in 1-2 weeks, provided with the patient's home address and home phone   number and they said that they will call her with an appointment.  The   patient will be set up for outpatient physical therapy.      DISCHARGE MEDICATIONS:   1. Aspirin 325 p.o. daily.   2. Meclizine 12.5 q.6 h.   3. All Clear over-the-counter every morning.   4. Advair 250/50 one puff twice daily.   5. Celebrex 200 p.o. daily.   6. Cranberry 1 tablet p.o. every other day.   7. Calcium with vitamin D 1 tablet p.o. twice daily.   8. Colace 100 mg p.o. every morning.   9. Fish oil 1200 mg 1 capsule p.o. daily.   10.Fosamax 70 mg 1 tablet p.o. weekly.   11.Lysine 500 mg p.o. every other day.   12.Milk of mag 2 tablespoons p.o. daily as needed.   13.Metoprolol XL 25 p.o. daily.   14.Multivitamin 1/2 tablet p.o. twice daily.   15.Nitroglycerin sublingual q.5 minutes p.r.n.   16.Odorless garlic 1250 mg 1 capsule p.o. every other day.   17.Albuterol 2 puffs inhaled q.4 fur p.r.n.   18.Pioglitazone or placebo 45 mg 1 tablet p.o. daily.   19.Spiriva 18 mcg 1 capsule inhaled daily.   20.Tramadol  50 mg 1 tablet p.o. every morning.   21.Hydrocodone 5/500 one-half tablet p.o. daily at bedtime.   22.Vitamin D3 2000 units 1 tablet p.o. twice daily.   23.Zocor 40 mg p.o. daily.      DICTATION ENDS HERE               Richarda Overlie, MD            NA/MEDQ  D:  05/23/2010  T:  05/23/2010  Job:  [id number]      cc:   Dorna Leitz, M.D., F.A.C.S.   Fax: [phone]      Peyton Najjar, MD      Electronically Signed by Richarda Overlie MD on 06/14/2010 01:34:41 PM

## 2010-11-19 NOTE — Progress Notes (Signed)
Summary: sob,referral  Phone Note Call from Patient   Caller: Patient Reason for Call: Referral Summary of Call: dr. [name] [phone] pt wants to go to a pulmonary doctor within the [facility] [place] [facility]. she said that her and dr. hopper have talked about this before. she is having a hard time breathing.pt is going out of town 9.4 with her children and wants to have help before then due to her having trouble breathing.pt is also short of breath needs to be seen by dr. hopper as well.he doesnt have an opening until friday. last ov was in 10.12.07.she is willing to have the apt on friday at 11:15. she says that she does have adviar but is still short of breath.  Initial call taken by: Charolette Child,  June 08, 2007 11:05 AM  Follow-up for Phone Call        spoke with pt says completely give out climbing stairs now using inhalers more often last seen 2007 says has appt on fri said will keep that aware will need ov before referral Follow-up by: Kandice Hams,  June 08, 2007 2:41 PM

## 2010-11-19 NOTE — Progress Notes (Signed)
Summary: rx refill forms  Phone Note Call from Patient   Caller: Patient Call For: Yuriko Portales Summary of Call: pt wants 90 days supply of pro air HFA faxed to [facility]. says that it's ok to cancel rx (for same) w/ [facility]. pt has dropped off forms and i have given forms to lori. pt# [phone] Initial call taken by: Tivis Ringer, CNA,  November 15, 2009 12:17 PM  Follow-up for Phone Call        Form filled out for refill on Proair HFA. Pt was on proventil for cost reasons, but wanted to switch back to proair becaus eit works better and cost is the same. Per 09/27/09 note ok pr RA to have either proventil or proair. I have placed paperwork for RA to sign. Pt aware.Carron Curie CMA  November 15, 2009 2:16 PM

## 2010-11-19 NOTE — Assessment & Plan Note (Signed)
Summary: acute sick visit for copd exacerbation.   Copy to:  Dr. Charlies Constable Primary Provider/Referring Provider:  [name], MD  CC:  Pt is here for a sick visit.  RA's pt.  Pt states symptoms started approx 3 days ago.  Pt c/o wheezing, increased sob with exertion, coughing up yellow sputum, tightness in chest, nasal congestion, yellow nasal drainage.  Pt denied fever, and sore throat or swelling in lower extremities.  Pt states she had some Xopenex Nebs at home that she is taking approx 3 to 4 times a day since having increased sob.  Pt also took some Doxycycline 100mg  bid for 4 days that she had leftover at home.  Pt is currently on 20mg  of Prednisone since Friday June 3.  .  History of Present Illness: the pt is a 70y/o female with know moderate to severe emphysema and cardiac disease who come in today for an acute sick visit.  She gives a 3 day h/o cough with purulent mucus, chest tightness, nasal congestion with discolored discharge, and worsening doe above her usual baseline.  She is taking her nebs 4 times a day, has self medicated with leftover doxy and on hand prednisone.  She denies any fever, chills, sweats.  Current Medications (verified): 1)  Spiriva Handihaler 18 Mcg Caps (Tiotropium Bromide Monohydrate) .... Two Puffs Once Daily 2)  Advair Diskus 250-50 Mcg/dose  Misc (Fluticasone-Salmeterol) .Marland Kitchen.. 1 Puff Q12 Hours 3)  Proair Hfa 108 (90 Base) Mcg/act Aers (Albuterol Sulfate) .... Inhale Two Puffs Every 4 Hours As Needed 4)  Metoprolol Succinate 25 Mg Xr24h-Tab (Metoprolol Succinate) .Marland Kitchen.. 1 By Mouth Once Daily 5)  Aspirin 325 Mg Tabs (Aspirin) .... Once Daily 6)  Plavix 75 Mg Tabs (Clopidogrel Bisulfate) .... Take 1 Tablet By Mouth Once A Day 7)  Simvastatin 40 Mg  Tabs (Simvastatin) .Marland Kitchen.. 1 By Mouth At Bedtime 8)  Fish Oil 1200 Mg Caps (Omega-3 Fatty Acids) .... Once Daily 9)  Tramadol Hcl 50 Mg Tabs (Tramadol Hcl) .... One Tab Each Morning 10)  Alendronate Sodium 70 Mg   Tabs (Alendronate Sodium) .Marland Kitchen.. 1 By Mouth Qwk 11)  Calcium With Vitamin D, K, Magnesium and Zinc .... Take 1 Tablet By Mouth  Two Times A Day 12)  Vitamin D3 2000 Unit Caps (Cholecalciferol) .... Take 1 Tablet By Mouth Two Times A Day 13)  Multivitamins  Tabs (Multiple Vitamin) .... Take 1/2  Tablet By Mouth Two Times A Day 14)  Doqlace 100 Mg .... Take 1 Tablet By Mouth Once A Day 15)  Study Drug-For Stoke .... Take 1 Tablet By Mouth Once Daily 16)  Nitroglycerin 0.4 Mg Subl (Nitroglycerin) .... As Needed 17)  L-Lysine Hcl 500 Mg Tabs (Lysine Hcl) .... Once Daily or Every Other Day 18)  Odorless Garlic 1250 Mg Tabs (Garlic) .... Once Daily 19)  Allrest .... Otc 1 Tab Once Daily 20)  Oxygen .... 2 Liters At Bedtime 21)  Celebrex 200 Mg Caps (Celecoxib) .Marland Kitchen.. 1 By Mouth Every Other Day 22)  Phillips Colon Health  Caps (Probiotic Product) .Marland Kitchen.. 1 By Mouth Daily----Hold 23)  Non-Aspirin 325 Mg Tabs (Acetaminophen) .... 2 By Mouth At Bedtime 24)  Prednisone 20 Mg Tabs (Prednisone) .... Take 1 Tablet By Mouth Once A Day 25)  Urinary Tract Health 365-50 Mg Caps (Cranberry-Olive Leaf) .Marland Kitchen.. 1 Once Daily 26)  Promethazine-Codeine 6.25-10 Mg/75ml Syrp (Promethazine-Codeine) .... 2.5 To 5ml By Mouth At Bedtime As Needed 27)  Pain Med  (Unsure of The Name  or Dosage) .... Take 1 Tab By Mouth At Bedtime 28)  Xopenex 1.25 Mg/71ml Nebu (Levalbuterol Hcl) .... Use 1 Vial in Nebulizer Three To Four Times Daily As Needed  Allergies (verified): 1)  ! Sulfa 2)  ! Codeine  Review of Systems       The patient complains of shortness of breath with activity, productive cough, chest pain, nasal congestion/difficulty breathing through nose, and change in color of mucus.  The patient denies shortness of breath at rest, non-productive cough, coughing up blood, irregular heartbeats, acid heartburn, indigestion, loss of appetite, weight change, abdominal pain, difficulty swallowing, sore throat, tooth/dental problems,  headaches, sneezing, itching, ear ache, anxiety, depression, hand/feet swelling, joint stiffness or pain, rash, and fever.    Vital Signs:  Patient profile:   70 year old female Height:      65 inches Weight:      168 pounds BMI:     28.06 O2 Sat:      94 % on Room air Temp:     97.5 degrees F oral Pulse rate:   95 / minute BP sitting:   146 / 80  (left arm) Cuff size:   regular  Vitals Entered By: Arman Filter LPN (March 25, 1609 3:32 PM)  O2 Flow:  Room air CC: Pt is here for a sick visit.  RA's pt.  Pt states symptoms started approx 3 days ago.  Pt c/o wheezing, increased sob with exertion, coughing up yellow sputum, tightness in chest, nasal congestion, yellow nasal drainage.  Pt denied fever, sore throat or swelling in lower extremities.  Pt states she had some Xopenex Nebs at home that she is taking approx 3 to 4 times a day since having increased sob.  Pt also took some Doxycycline 100mg  bid for 4 days that she had leftover at home.  Pt is currently on 20mg  of Prednisone since Friday June 3.   Comments Medications reviewed with patient Arman Filter LPN  March 26, 9603 3:32 PM    Physical Exam  General:  ow female in nad no increased wob. Nose:  no obvious discharge or purulence. Mouth:  clear Lungs:  decreased bs throughout, but no wheezing or rhonchi Heart:  rrr Extremities:  mild pedal edema, no cyanosis Neurologic:  alert and oriented, moves all 4.   Impression & Recommendations:  Problem # 1:  CHRONIC OBSTRUCTIVE PULMONARY DISEASE, ACUTE EXACERBATION (ICD-491.21) the pt gives a history that is most c/w copd exacerbation.  Cannot exclude the possibility of concomitant sinusitis.  She will need a course of abx, as well as a prednisone taper.  I have cautioned the pt about self medicating, and especially if the meds are expired.  She will need f/u reasonably soon with Dr. Vassie Loll to check on her progress.  Medications Added to Medication List This Visit: 1)  Tramadol  Hcl 50 Mg Tabs (Tramadol hcl) .... One tab each morning 2)  Prednisone 20 Mg Tabs (Prednisone) .... Take 1 tablet by mouth once a day 3)  Pain Med (unsure of The Name or Dosage)  .... Take 1 tab by mouth at bedtime 4)  Xopenex 1.25 Mg/32ml Nebu (Levalbuterol hcl) .... Use 1 vial in nebulizer three to four times daily as needed 5)  Cefdinir 300 Mg Caps (Cefdinir) .... 2 by mouth each am for 7 days. 6)  Prednisone 10 Mg Tabs (Prednisone) .... Take 4 each day for 2 days, then 3 each day for 2 days, then 2 each day  for 2 days, then 1 each day for 2 days, then stop 7)  Promethazine-codeine 6.25-10 Mg/15ml Syrp (Promethazine-codeine) .... 2.5 by mouth every 4-6 hours as needed  Other Orders: Est. Patient Level IV (14782)  Patient Instructions: 1)  will treat with omnicef as directed 2)  prednisone taper as directed 3)  followup with Dr Vassie Loll in one week.   Prescriptions: PROMETHAZINE-CODEINE 6.25-10 MG/5ML  SYRP (PROMETHAZINE-CODEINE) 2.5 by mouth every 4-6 hours as needed  #4 ounces x 0   Entered and Authorized by:   Barbaraann Share MD   Signed by:   Barbaraann Share MD on 03/25/2010   Method used:   Print then Give to Patient   RxID:   [id number] PREDNISONE 10 MG  TABS (PREDNISONE) take 4 each day for 2 days, then 3 each day for 2 days, then 2 each day for 2 days, then 1 each day for 2 days, then stop  #20 x 0   Entered and Authorized by:   Barbaraann Share MD   Signed by:   Barbaraann Share MD on 03/25/2010   Method used:   Print then Give to Patient   RxID:   [id number] CEFDINIR 300 MG CAPS (CEFDINIR) 2 by mouth each am for 7 days.  #14 x 0   Entered and Authorized by:   Barbaraann Share MD   Signed by:   Barbaraann Share MD on 03/25/2010   Method used:   Print then Give to Patient   RxID:   [id number]

## 2010-11-19 NOTE — Progress Notes (Signed)
  Phone Note Other Incoming   Request: Send information Summary of Call: Records request received from [facility], request forwarded to [employer].

## 2010-11-19 NOTE — Consult Note (Signed)
Summary: [facility]   Imported By: Lanelle Bal 12/12/2009 09:02:01  _____________________________________________________________________  External Attachment:    Type:   Image     Comment:   External Document

## 2010-11-19 NOTE — Progress Notes (Signed)
Summary: samples   Phone Note Call from Patient Call back at Home Phone [phone]   Caller: Patient Call For: young Summary of Call: Wants samples of advair diskus 250-53mcg, proair hfa 108, and spiriva handihaler . Initial call taken by: Darletta Moll,  July 26, 2010 10:30 AM  Follow-up for Phone Call        Pt was last seen by RA 6.15.11 and told to f/u with TP in 3 months.  No pending appts.  Sample of advair 250, spiriva, and ventolin left at front but pt needs to make f/u appt.    ATC pt x 2 -- each time call was ended at 5-6sec.  WCB  Spiriva: Lot # [id number] Exp Date 05/2011 Ventolin: Lot # [id number] Exp Date 07/2011 Advair 250: Lot # [id number] Exp Date 08/2011 Follow-up by: Gweneth Dimitri RN,  July 26, 2010 10:41 AM  Additional Follow-up for Phone Call Additional follow up Details #1::        ATC above number again -- phone rang but after a few rings call ended. WCB   Crystal Jones RN  July 26, 2010 11:44 AM  ATC above number again -- phone rang but after several rings call ended.  WCB  Crystal Jones RN  July 26, 2010 4:05 PM     Additional Follow-up for Phone Call Additional follow up Details #2::    Called, spoke with pt.  She was informed samples at front to pick up.  OV scheduled with TP for 11.7.11 at 4:15pm -- pt aware.   Follow-up by: Gweneth Dimitri RN,  July 29, 2010 5:23 PM

## 2010-11-19 NOTE — Progress Notes (Signed)
Summary: Florastor  Phone Note Call from Patient   Caller: Patient Summary of Call: Pt calling re florastor.  States a months of florastor is over $40.  Pt states she can not affort this.  wants to talk to nurse re another med that she can take that would be less expensive. Initial call taken by: Ashok Cordia RN,  January 04, 2010 1:43 PM  Follow-up for Phone Call        LM for pt to call.  Lupita Leash Theodosia Bahena RN  January 04, 2010 1:44 PM  Pt instructed to take phillips colon health as per last OV note.   Follow-up by: Ashok Cordia RN,  January 04, 2010 2:26 PM

## 2010-11-19 NOTE — Assessment & Plan Note (Signed)
Summary: f70m   Visit Type:  6 months follow up Referring Provider:  Dr. Charlies Constable Primary Provider:  [name], MD  CC:  Pt. would like to como off plavi.  History of Present Illness: The patient is 70 years old and return for management of CAD. She recently developed increasing symptoms of shortness of breath with exertion and were concerned this might be an anginal equivalent. She underwent catheterization and was found to have nonobstructive coronary disease. Her recent stay in the circulatory was widely patent. Her ejection fraction was 45% and a pulmonary wedge pressure was normal. We thought her symptoms were not likely related to her heart.  She initially had an anterior MI in 1992 treated with TPA followed by PTCA. In 2010 in July she had a drug eluding stent the certain what sorry for unstable angina.  She has had a previous stroke treated with TPA in 2009.  she also has severe obstructive pulmonary disease with an FEV1 of less than 1 L.  She has been doing well recently with no CP and less SOB.  She would like to get off Plavix because of expense and bruising.  Current Medications (verified): 1)  Spiriva Handihaler 18 Mcg Caps (Tiotropium Bromide Monohydrate) .... Two Puffs Once Daily 2)  Advair Diskus 250-50 Mcg/dose  Misc (Fluticasone-Salmeterol) .Marland Kitchen.. 1 Puff Q12 Hours 3)  Proair Hfa 108 (90 Base) Mcg/act Aers (Albuterol Sulfate) .... Inhale Two Puffs Every 4 Hours As Needed 4)  Metoprolol Succinate 25 Mg Xr24h-Tab (Metoprolol Succinate) .Marland Kitchen.. 1 By Mouth Once Daily 5)  Aspirin 325 Mg Tabs (Aspirin) .... Once Daily 6)  Plavix 75 Mg Tabs (Clopidogrel Bisulfate) .... Take 1 Tablet By Mouth Once A Day 7)  Simvastatin 40 Mg  Tabs (Simvastatin) .Marland Kitchen.. 1 By Mouth At Bedtime 8)  Fish Oil 1200 Mg Caps (Omega-3 Fatty Acids) .... Once Daily 9)  Tramadol Hcl 50 Mg Tabs (Tramadol Hcl) .... As Needed 10)  Alendronate Sodium 70 Mg  Tabs (Alendronate Sodium) .Marland Kitchen.. 1 By Mouth Qwk 11)   Calcium With Vitamin D, K, Magnesium and Zinc .... Take 1 Tablet By Mouth  Two Times A Day 12)  Vitamin D3 2000 Unit Caps (Cholecalciferol) .... Take 1 Tablet By Mouth Two Times A Day 13)  Multivitamins  Tabs (Multiple Vitamin) .... Take 1/2  Tablet By Mouth Two Times A Day 14)  Doqlace 100 Mg .... Take 1 Tablet By Mouth Once A Day 15)  Study Drug-For Stoke .... Take 1 Tablet By Mouth Once Daily 16)  Nitroglycerin 0.4 Mg Subl (Nitroglycerin) .... As Needed 17)  L-Lysine Hcl 500 Mg Tabs (Lysine Hcl) .... Once Daily or Every Other Day 18)  Odorless Garlic 1250 Mg Tabs (Garlic) .... Every Other Day 19)  Allclear .... Otc 1 Tab Once Daily 20)  Oxygen .... 2 Liters At Bedtime 21)  Celebrex 200 Mg Caps (Celecoxib) .Marland Kitchen.. 1 By Mouth Every Other Day 22)  Urinary Tract Health 365-50 Mg Caps (Cranberry-Olive Leaf) .... Every Other Day 23)  Vicodin 5-500 Mg Tabs (Hydrocodone-Acetaminophen) .... As Needed 24)  Prednisone 10 Mg Tabs (Prednisone) .... As Needed  Allergies: 1)  ! Sulfa 2)  ! Codeine  Past History:  Past Medical History: Reviewed history from 07/25/2009 and no changes required. 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   Vital Signs:  Patient profile:   70 year old female Height:      65 inches Weight:      165 pounds BMI:     27.56 Pulse rate:   86 / minute Pulse rhythm:   regular Resp:      18 per minute BP sitting:   128 / 80  (left arm) Cuff size:   large  Vitals Entered By: Burnett Kanaris, CNA (May 14, 2010 4:40 PM)  Physical Exam  Additional Exam:  Gen. Well-nourished, in no distress   Neck: No JVD, thyroid not enlarged, no carotid bruits Lungs: No tachypnea, clear without rales, rhonchi or wheezes Cardiovascular: Rhythm regular, PMI not displaced,  heart sounds  normal, no murmurs or gallops, no peripheral edema, pulses normal in all 4 extremities. Abdomen: BS normal, abdomen soft and non-tender without masses or organomegaly, no hepatosplenomegaly. MS: No deformities, no cyanosis or clubbing   Neuro:  No focal sns   Skin:  no lesions    Impression & Recommendations:  Problem # 1:  CAD, NATIVE VESSEL (ICD-414.01) She has had multiple PCI procedures as described above and patent stent and non-obstr CAD at recent cath.  I think we can stop plavix now that she is a year out from her stent because it is a second gen DES, put in the CFX and not in the setting of MI.  We can also reduce ASA to 81 mg. The following medications were removed from the medication list:    Plavix 75 Mg Tabs (Clopidogrel bisulfate) .Marland Kitchen... Take 1 tablet by mouth once a day Her updated medication list for this problem includes:    Metoprolol Succinate 25 Mg Xr24h-tab (Metoprolol succinate) .Marland Kitchen... 1 by mouth once daily    Aspirin 81 Mg Tbec (Aspirin) .Marland Kitchen... Take one tablet by mouth daily    Nitroglycerin 0.4 Mg Subl (Nitroglycerin) .Marland Kitchen... As needed  Orders: EKG w/ Interpretation (93000)  Problem # 2:  CHRONIC OBSTRUCTIVE PULMONARY DISEASE, ACUTE EXACERBATION (ICD-491.21) She has severe COPD followed by Dr Vassie Loll. The following medications were removed from the medication list:    Xopenex 1.25 Mg/77ml Nebu (Levalbuterol hcl) ..... Use 1 vial in nebulizer three to four times daily as needed Her updated medication list for this problem includes:    Spiriva Handihaler 18 Mcg Caps (Tiotropium bromide  monohydrate) .Marland Kitchen..Marland Kitchen Two puffs once daily    Advair Diskus 250-50 Mcg/dose Misc (Fluticasone-salmeterol) .Marland Kitchen... 1 puff q12 hours    Proair Hfa 108 (90 Base) Mcg/act Aers (Albuterol sulfate) ..... Inhale two puffs every 4 hours as needed    Prednisone 10 Mg Tabs (Prednisone) .Marland Kitchen... As needed  Problem # 3:  HYPERLIPIDEMIA-MIXED (ICD-272.4) Currently managed with simvistatin. Her updated medication list for this problem includes:    Simvastatin 40 Mg Tabs (Simvastatin) .Marland Kitchen... 1 by mouth at bedtime  Patient Instructions: 1)  Decrease Aspirin to 81mg  once daily. 2)  Stop Plavix. 3)  Your physician recommends that you schedule a follow-up appointment in: 4 months.

## 2010-11-19 NOTE — Progress Notes (Signed)
Summary: Inpt.  Phone Note Call from Patient Call back at [phone]   Caller: Patient Summary of Call: patient is inpt and would like to talk with a nurse.  Initial call taken by: Harlow Mares CMA Duncan Dull),  November 07, 2009 12:19 PM  Follow-up for Phone Call        Pt calling wanted Dr. Jarold Motto to know that she is an inpatient at [facility].  Admitted Saturday 11/04/09 with diverticulitis.  Being followed by a hospitalist,  Dr. Ashley Royalty.  Pt does not feel she is getting better like she should.  Pt requesting to make appt with Dr. Jarold Motto when she is discharged.  Pt instructed to call us back when discharged. Follow-up by: Ashok Cordia RN,  November 07, 2009 12:27 PM

## 2010-11-19 NOTE — Medication Information (Signed)
Summary: [facility] prescriptions  [facility] prescriptions   Imported By: Kassie Mends 12/24/2009 13:59:10  _____________________________________________________________________  External Attachment:    Type:   Image     Comment:   External Document

## 2010-11-19 NOTE — Assessment & Plan Note (Signed)
Summary: f27m   Visit Type:  4 months follow up Referring Solyana Nonaka:  na Primary Silvana Holecek:  [name], MD  CC:  cardiac clearance left shoulder surgery.  History of Present Illness: [name] is 70 years old and came in for a preoperative evaluation prior to shoulder surgery. She initially had an anterior MI in 1992 treated with TPA followed by PTCA. In 2010 in July she had a drug eluding stent to the circumflex artery for unstable angina.   She underwent catheterization last year for evaluation of SOB and was found to have nonobstructive coronary disease. Her recent stent in the circumflex artery was widely patent. Her ejection fraction was 45% and a pulmonary wedge pressure was normal. We thought her symptoms were not likely related to her heart.  We thought her symptoms were related to her severe chronic obstructive pulmonary disease.  She has had a previous stroke treated with TPA in 2009.  She also has severe obstructive pulmonary disease with an FEV1 of less than 1 L.  She has been doing well recently with no CP and less SOB.  She has been off Plavix since her last visit.  Current Medications (verified): 1)  Spiriva Handihaler 18 Mcg Caps (Tiotropium Bromide Monohydrate) .... Two Puffs Once Daily 2)  Advair Diskus 250-50 Mcg/dose  Misc (Fluticasone-Salmeterol) .Marland Kitchen.. 1 Puff Q12 Hours 3)  Proair Hfa 108 (90 Base) Mcg/act Aers (Albuterol Sulfate) .... Inhale Two Puffs Every 4 Hours As Needed 4)  Metoprolol Succinate 25 Mg Xr24h-Tab (Metoprolol Succinate) .Marland Kitchen.. 1 By Mouth Once Daily(Lost Medicine Has Not Taken in Two Days Will Get Replaced and Resume 5)  Aspirin 81 Mg Tbec (Aspirin) .... Take One Tablet By Mouth Daily 6)  Simvastatin 40 Mg  Tabs (Simvastatin) .Marland Kitchen.. 1 By Mouth At Bedtime 7)  Fish Oil 1200 Mg Caps (Omega-3 Fatty Acids) .... Once Daily 8)  Tramadol Hcl 50 Mg Tabs (Tramadol Hcl) .... Take 1 Tab By Mouth At Bedtime 9)  Alendronate Sodium 70 Mg  Tabs (Alendronate Sodium)  .Marland Kitchen.. 1 By Mouth Qwk 10)  Calcium-Vitamin D 500-200 Mg-Unit Tabs (Calcium-Vitamin D) .... Take 1 Tablet By Mouth Two Times A Day 11)  Vitamin D3 2000 Unit Caps (Cholecalciferol) .... Take 1 Tablet By Mouth Two Times A Day 12)  Multivitamins  Tabs (Multiple Vitamin) .... Take 1/2  Tablet By Mouth Two Times A Day 13)  Doqlace 100 Mg .... Take 1 Tablet By Mouth Once A Day 14)  Study Drug-For Stoke .... Take 1 Tablet By Mouth Once Daily 15)  Nitroglycerin 0.4 Mg Subl (Nitroglycerin) .... As Needed 16)  L-Lysine Hcl 500 Mg Tabs (Lysine Hcl) .... Once Daily or Every Other Day 17)  Odorless Garlic 1250 Mg Tabs (Garlic) .... Take 1 Tablet By Mouth Once A Day 18)  Allclear .... Otc 1 Tab Once Daily 19)  Oxygen .... 2 Liters At Bedtime 20)  Celebrex 200 Mg Caps (Celecoxib) .... Take 1 Tablet By Mouth Two Times A Day 21)  Urinary Tract Health 365-50 Mg Caps (Cranberry-Olive Leaf) .... Take 1 Tablet By Mouth Once A Day 22)  Prednisone 10 Mg Tabs (Prednisone) .... As Needed 23)  Zolpidem Tartrate 5 Mg Tabs (Zolpidem Tartrate) .... Take One Tab By Mouth At Bedtime As Needed For Sleep. 24)  Ciprofloxacin Hcl 500 Mg Tabs (Ciprofloxacin Hcl) .... Take One By Mouth Two Times A Day For 10 Days 25)  Metronidazole 500 Mg Tabs (Metronidazole) .... Take One By Mouth Two Times A Day For  10 Days 26)  Dexilant 60 Mg Cpdr (Dexlansoprazole) .... Take One By Mouth Once Daily 27)  Robaxin 500 Mg Tabs (Methocarbamol) .... Take 1 Tablet By Mouth Two Times A Day 28)  Oxycodone Dose? .... One Tablet Every 6 Hours  Allergies: 1)  ! Sulfa 2)  ! Codeine  Past History:  Past Medical History: Reviewed history from 09/17/2010 and no changes required. 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 Family History of Breast Cancer: Sister x 2 No FH of Colon Cancer: Family History of Pancreatic Cancer: 2nd cousin Family History of Colon Polyps: Sister Family History of Diabetes: Paternal Grandmother, Maternal Uncle Family History of Heart Disease: Mother, Father, Maternal Grandfather, Paternal Grandfather Family History of Uterine Cancer: Paternal Grandmother  Social History: Reviewed history from 09/17/2010 and no changes required. Retired--Realtor  Patient is a former smoker. -stopped in 1999 Alcohol Use - yes-occasion Daily Caffeine Use Illicit Drug Use - no Patient does not get regular exercise.   Review of Systems       ROS is negative except as outlined in HPI.   Vital Signs:  Patient profile:   70 year old female Height:      65 inches Weight:      167.50 pounds BMI:     27.97 Pulse rate:   84 / minute Pulse rhythm:   regular Resp:     18 per minute BP sitting:   114 / 70  (right arm) Cuff size:   large  Vitals Entered By: Vikki Ports (September 24, 2010 4:44 PM)  Physical Exam  Additional Exam:  Gen. Well-nourished, in no distress Head: Normocephalic    Eyes: PERRLA/EOM intact; conjunctiva and lids normal Neck: No JVD, thyroid not enlarged, no carotid bruits Lungs: No tachypnea, clear w/o rales, rhonchi or wheezes CV: Rhythm regular,  PMI not displaced,  sounds  nl, no murmurs or gallops, no edema, pulses nl UE and LE Abd: BS nl, abd soft and non-tender w/o masses or hepatosplenomegaly MS: No deformities Neuro: no focal sns Skin: no lesions Psych: nl affect    Impression & Recommendations:  Problem # 1:  PREOPERATIVE EXAMINATION (ICD-V72.84) She is scheduled to have a total shoulder replacement on the left side by Dr. supple in January. I think her cardiac condition is stable and I think her cardiac risk for surgery with general anesthesia and only be slightly increased. I think her cardiac risk can be minimized if she could be kept on aspirin through the surgery. If Dr. supple thinks the bleeding risk is too high it could be stopped. She has also had some increased risk because of her severe chronic obstructive pulmonary disease. I don't A. she needs any other preoperative testing since she had a recent  catheterization procedure done.  Problem # 2:  CAD, NATIVE VESSEL (ICD-414.01) She has CAD with multiple PCI procedures as described in history of present illness. She has had no chest pain and this problem appears stable. Her updated medication list for this problem includes:    Metoprolol Succinate 25 Mg Xr24h-tab (Metoprolol succinate) .Marland Kitchen... 1 by mouth once daily(lost medicine has not taken in two days will get replaced and resume    Aspirin 81 Mg Tbec (Aspirin) .Marland Kitchen... Take one tablet by mouth daily    Nitroglycerin 0.4 Mg Subl (Nitroglycerin) .Marland Kitchen... As needed  Orders: EKG w/ Interpretation (93000)  Problem # 3:  CARDIOMYOPATHY, ISCHEMIC (ICD-414.8) She has an ejection fraction of 45%. She is euvolemic today. I think his problem is stable. Her updated medication list for this problem includes:    Metoprolol Succinate 25 Mg Xr24h-tab (Metoprolol succinate) .Marland Kitchen... 1 by mouth once daily(lost medicine has not taken in two days will get replaced and resume    Aspirin 81 Mg Tbec (Aspirin) .Marland Kitchen... Take one tablet by mouth  daily    Nitroglycerin 0.4 Mg Subl (Nitroglycerin) .Marland Kitchen... As needed  Problem # 4:  EMPHYSEMA (ICD-492.8) She has severe COPD. This is her main limiting problem. This is stable.  Patient Instructions: 1)  Your physician recommends that you continue on your current medications as directed. Please refer to the Current Medication list given to you today. 2)  Your physician wants you to follow-up in: 6 months with Dr. Clifton James.  You will receive a reminder letter in the mail two months in advance. If you don't receive a letter, please call our office to schedule the follow-up appointment.

## 2010-11-19 NOTE — Letter (Signed)
Summary: [facility]   Imported By: Marylou Mccoy 01/07/2010 16:53:22  _____________________________________________________________________  External Attachment:    Type:   Image     Comment:   External Document

## 2010-11-19 NOTE — Progress Notes (Signed)
Summary: lab orders  Phone Note Call from Patient   Caller: Patient Call For: Savana Spina Summary of Call: pt have questions about having more lab work done . she had some done with dr Juanda Chance last week need to make sure same labs are not being repeated Initial call taken by: Rickard Patience,  December 12, 2009 10:38 AM  Follow-up for Phone Call        pt wanted to make sure the lab that RA ordered for her to have beore appt todya was not the same thing that Dr. Juanda Chance just tested for. I advised RA ordered an alpha 1 test and this is not the same as what Dr. Juanda Chance ordered so I advised the pt to have lab drawn today. Carron Curie CMA  December 12, 2009 10:42 AM

## 2010-11-19 NOTE — Cardiovascular Report (Signed)
Summary: Pre Cath Orders  Pre Cath Orders   Imported By: Roderic Ovens 12/05/2009 16:44:39  _____________________________________________________________________  External Attachment:    Type:   Image     Comment:   External Document

## 2010-11-19 NOTE — Progress Notes (Signed)
Summary: Rumbling in her stomach  Phone Note Call from Patient Call back at Work Phone [phone]   Call For: Dr Jarold Motto Summary of Call: Having "rumbling" in her stomach and has medicines is just unsure which to take for it.  Initial call taken by: Leanor Kail [facility],  May 16, 2010 10:34 AM  Follow-up for Phone Call        Pt feels that she is starting to have a flare up of diverticulitis.  She has flagyl and cipro left over from her last flare up in Jan. [facility] wanted to make sure that these were both antioboitics.  She has started on these.  Pt insurcted to call back if she dose not improve.  Follow-up by: Ashok Cordia RN,  May 16, 2010 12:14 PM

## 2010-11-19 NOTE — Miscellaneous (Signed)
Summary: BONE DENSITY  Clinical Lists Changes  Orders: Added new Test order of T-Bone Densitometry (77080) - Signed Added new Test order of T-Lumbar Vertebral Assessment (77082) - Signed 

## 2010-11-19 NOTE — Progress Notes (Signed)
Summary: rx  Phone Note Call from Patient Call back at Home Phone [phone]   Caller: Patient Call For: Reganne Messerschmidt Reason for Call: Talk to Nurse Summary of Call: Needs Advair 250/50 called in to [facility] 90 day supply w/refills. Also needs a sample to hold her until rx gets here. Initial call taken by: Eugene Gavia,  December 27, 2009 4:58 PM  Follow-up for Phone Call        rx sent. pt given sample. Carron Curie CMA  December 27, 2009 5:04 PM     Prescriptions: ADVAIR DISKUS 250-50 MCG/DOSE  MISC (FLUTICASONE-SALMETEROL) 1 puff q12 hours  #3 x 3   Entered by:   Carron Curie CMA   Authorized by:   Comer Locket. Vassie Loll MD   Signed by:   Carron Curie CMA on 12/27/2009   Method used:   Faxed to ...       [facility]* (mail-order)             ,          Ph: [phone]       Fax: [phone]   RxID:   [id number] ADVAIR DISKUS 250-50 MCG/DOSE  MISC (FLUTICASONE-SALMETEROL) 1 puff q12 hours  #3 x 3   Entered by:   Carron Curie CMA   Authorized by:   Comer Locket. Vassie Loll MD   Signed by:   Carron Curie CMA on 12/27/2009   Method used:   Electronically to        [facility]* (mail-order)             ,          Ph: [phone]       Fax: [phone]   RxID:   [id number]

## 2010-11-19 NOTE — Progress Notes (Signed)
Summary: bronchitis   [id number]  Phone Note Call from Patient   Caller: Patient Call For: alva Summary of Call: pt have bronchitis would like something called to pharmacy Initial call taken by: Rickard Patience,  January 21, 2010 4:36 PM  Follow-up for Phone Call        called and spoke with pt.  pt states she recently saw RA on 01-16-2010.  pt states she now c/o non-productive cough, sore and scratchy throat. Pt concerned this will turn into bronchitis.   pt denied tightness in chest and fever.  pt states RA had given her a sample of Avelox from previously and she had 1 tablet at home left and took this today.  Pt would like rx for this.  Also, states she had rx for Hydromet but this has been d/c'd and therefore would like a different rx for cough syrup.  (insurance won't cover hycodan, hydrocodone or Tussigon)  please advise.  thank you. Aundra Millet Reynolds LPN  January 21, 1609 4:55 PM   Additional Follow-up for Phone Call Additional follow up Details #1::        ok to take avelox  - only if sputum turns yellow or green OK for hydromet 2.5 - 5ml by mouth two times a day as needed cough Additional Follow-up by: Comer Locket. Vassie Loll MD,  January 21, 2010 6:02 PM    Additional Follow-up for Phone Call Additional follow up Details #2::    called, spoke with pt.  Pt informed of above recs per RA and stated her insurance would not cover hydromet or other meds listed above.  Requesting hydormet to be changed.  Aware avelox rx sent to [facility].  Pt informed RA not in office until this evening-she is ok with this.  Will forward to RA - pls advise on what you would like hydormet changed to.  Thanks! Gweneth Dimitri RN  January 22, 2010 9:17 AM   I thought she had said hydromet was covered. Try promethazine -codeine 2.5 - 5ml by mouth at bedtime as needed  Follow-up by: Comer Locket. Vassie Loll MD,  January 22, 2010 12:38 PM  Additional Follow-up for Phone Call Additional follow up Details #3:: Details for Additional  Follow-up Action Taken: [facility]. Arman Filter LPN  January 23, 9603 2:29 PM   called and spoke with pt.  pt aware cough syrup sent to pharmacy.  Megan Reynolds LPN  January 23, 5408 4:41 PM   New/Updated Medications: AVELOX 400 MG TABS (MOXIFLOXACIN HCL) once daily PROMETHAZINE-CODEINE 6.25-10 MG/5ML SYRP (PROMETHAZINE-CODEINE) 2.5 to 5ml by mouth at bedtime as needed Prescriptions: PROMETHAZINE-CODEINE 6.25-10 MG/5ML SYRP (PROMETHAZINE-CODEINE) 2.5 to 5ml by mouth at bedtime as needed  #100 x 0   Entered by:   Arman Filter LPN   Authorized by:   Comer Locket. Vassie Loll MD   Signed by:   Arman Filter LPN on 81/19/1478   Method used:   Telephoned to ...       [facility] [phone]* (retail)       [address] [place], [place]  [address]       Ph: [phone]       Fax: [phone]   RxID:   [id number] AVELOX 400 MG TABS (MOXIFLOXACIN HCL) once daily  #50 x 0   Entered and Authorized by:   Comer Locket. Vassie Loll MD   Signed by:  [name] MD on 01/21/2010   Method used:   Electronically to        [id number]* (retail)       [address] [place], [place]  [address]       Ph: [phone]       Fax: [phone]   RxID:   [id number]

## 2010-11-19 NOTE — Progress Notes (Signed)
Summary: shoulder injection  Phone Note Call from Patient Call back at Home Phone [phone]   Reason for Call: Talk to Nurse Summary of Call: Dr Amanda Pea says she needs injection for shoulder pain, must be off of plavix for 5 days Initial call taken by: Migdalia Dk,  November 29, 2009 1:19 PM  Follow-up for Phone Call        talked with patient--pt having severe shoulder pain--Dr Gramig recommended shoulder injection that would require holding Plavix for 5 days--pt scheduled for JV cath 12-04-09--pt will discuss having injection with Dr Juanda Chance at time of cath 12-04-09-I will forward to Dr Juanda Chance for review

## 2010-11-19 NOTE — Consult Note (Signed)
Summary: [facility]  [facility]   Imported By: Lanelle Bal 07/09/2010 10:13:12  _____________________________________________________________________  External Attachment:    Type:   Image     Comment:   External Document

## 2010-11-19 NOTE — Progress Notes (Signed)
Summary: Reclast Concerns  Phone Note Call from Patient Call back at Home Phone [phone]   Caller: Patient Summary of Call: Message left on Felicia's VM: Patient would like to consider Reclast and would like to know what steps she needs to go through to see if she is approved for this with her insurance. Patient states her friend has Medicare and they convered it for her so she would like to see if they will cover it for her.   I called patient and informed her that I will start the process for her and I will call her when I hear back from the verification department./[facility]  January 03, 2010 4:50 PM   Follow-up for Phone Call        I recieved report from Reclast and the patient did Not qualify, Dr.Hopper re-reviewed BMD and agreed patient doesnt qualify but it was worth a try.  I contacted the patient and she is aware and ok'd. Patient then requested that she have a Vit D level checked, schedule for [place] tomorrow. Follow-up by: Shonna Chock,  January 08, 2010 10:37 AM

## 2010-11-19 NOTE — Medication Information (Signed)
Summary: Confirmation Receipt & Denial for Reclast Reimbursement/[employer]  Confirmation Receipt & Denial for Reclast Reimbursement/[employer]   Imported By: Lanelle Bal 01/11/2010 14:14:54  _____________________________________________________________________  External Attachment:    Type:   Image     Comment:   External Document

## 2010-11-19 NOTE — Assessment & Plan Note (Signed)
Summary: S/P CATH   Referring Provider:  Dr. Charlies Constable Primary Provider:  [name], MD   History of Present Illness: The patient is 70 years old and return for management of CAD. She recently developed increasing symptoms of shortness of breath with exertion and were concerned this might be an anginal equivalent. She underwent catheterization and was found to have nonobstructive coronary disease. Her recent stay in the circulatory was widely patent. Her ejection fraction was 45% and a pulmonary wedge pressure was normal. We thought her symptoms were not likely related to her heart.  She initially had an anterior MI in 1992 treated with TPA followed by PTCA. In 2010 in July she had a drug eluding stent the certain what sorry for unstable angina.  She has had a previous stroke treated with TPA in 2009.  she also has severe obstructive pulmonary disease with an FEV1 of less than 1 L.  Current Medications (verified): 1)  Spiriva Handihaler 18 Mcg Caps (Tiotropium Bromide Monohydrate) .... Two Puffs Once Daily 2)  Advair Diskus 250-50 Mcg/dose  Misc (Fluticasone-Salmeterol) .Marland Kitchen.. 1 Puff Q12 Hours 3)  Proair Hfa 108 (90 Base) Mcg/act Aers (Albuterol Sulfate) .... Inhale Two Puffs Every 4 Hours As Needed 4)  Metoprolol Succinate 25 Mg Xr24h-Tab (Metoprolol Succinate) .Marland Kitchen.. 1 By Mouth Once Daily 5)  Aspirin 325 Mg Tabs (Aspirin) .... Once Daily 6)  Plavix 75 Mg Tabs (Clopidogrel Bisulfate) .... Take 1 Tablet By Mouth Once A Day 7)  Simvastatin 40 Mg  Tabs (Simvastatin) .Marland Kitchen.. 1 By Mouth At Bedtime 8)  Fish Oil 1200 Mg Caps (Omega-3 Fatty Acids) .... Once Daily 9)  Tramadol Hcl 50 Mg Tabs (Tramadol Hcl) .... One Tab At Bedtime 10)  Alendronate Sodium 70 Mg  Tabs (Alendronate Sodium) .Marland Kitchen.. 1 By Mouth Qwk 11)  Calcium With Vitamin D, K, Magnesium and Zinc .... Take 1 Tablet By Mouth  Two Times A Day 12)  Vitamin D3 2000 Unit Caps (Cholecalciferol) .... Take 1 Tablet By Mouth Two Times A Day 13)   Multivitamins  Tabs (Multiple Vitamin) .... Take 1/2  Tablet By Mouth Two Times A Day 14)  B-100 Complex  Tabs (Vitamins-Lipotropics) .... Take 1/2  Tablet By Mouth Once A Day 15)  Colace 100 Mg Caps (Docusate Sodium) .... Take 1 Tablet By Mouth Once A Day 16)  Study Drug-For Stoke .... Take 1 Tablet By Mouth Once Daily 17)  Nitroglycerin 0.4 Mg Subl (Nitroglycerin) .... As Needed 18)  L-Lysine Hcl 500 Mg Tabs (Lysine Hcl) .... Once Daily or Every Other Day 19)  Odorless Garlic 1250 Mg Tabs (Garlic) .... Once Daily 20)  Allrest .... Otc 1 Tab Once Daily 21)  Oxygen .... 2 Liters At Bedtime 22)  Celebrex 200 Mg Caps (Celecoxib) .Marland Kitchen.. 1 By Mouth Every Other Day 23)  Phillips Colon Health  Caps (Probiotic Product) .Marland Kitchen.. 1 By Mouth Daily----Hold 24)  Non-Aspirin 325 Mg Tabs (Acetaminophen) .... 2 By Mouth At Bedtime 25)  Prednisone 20 Mg Tabs (Prednisone) .... 1/2 By Mouth Daily  Allergies (verified): 1)  ! Sulfa 2)  ! Codeine  Past History:  Past Medical History: Reviewed history from 07/25/2009 and no changes required. Right bimallleolar ankle fracture--12/2005 Right lateral ankle wound infection, probable methicillin sensitive staph aureua-- 01/2006 Coronary artery disease S/P MI 1990, S/P PTCA LAD 1999, S/P DES CFX 04/2009 Hyperlipidemia Osteopenia Tobacco abuse, hx of (D/Ced 10/20/1997) COPD      - Spirometry 07/25/09 FEV1 0.85(41%), FVC 2.09(72%), FEV1% 41 Hypoxemia      -  Pulse ox 86% room air 07/25/09      - 2 liters with exertion and sleep  Cerebrovascular accident, hx of ,S/P TPA 04/20/2008, Dr Pearlean Brownie Diverticulitis, hx of, 11/09 Tubular Adenoma- high grade dysplasia 2007  Review of Systems       ROS is negative except as outlined in HPI.   Vital Signs:  Patient profile:   70 year old female Height:      65 inches Weight:      171 pounds BMI:     28.56 Pulse rate:   73 / minute Resp:     16 per minute BP sitting:   114 / 69  (left arm)  Vitals Entered By: Marrion Coy, CNA (January 15, 2010 4:11 PM)  Physical Exam  Additional Exam:  Gen. Well-nourished, in no distress   Neck: No JVD, thyroid not enlarged, no carotid bruits Lungs: No tachypnea, clear without rales, rhonchi or wheezes Cardiovascular: Rhythm regular, PMI not displaced,  heart sounds  decreased, no murmurs or gallops, no peripheral edema, pulses normal in all 4 extremities. Abdomen: BS normal, abdomen soft and non-tender without masses or organomegaly, no hepatosplenomegaly. MS: No deformities, no cyanosis or clubbing   Neuro:  No focal sns   Skin:  no lesions    Impression & Recommendations:  Problem # 1:  CAD, NATIVE VESSEL (ICD-414.01) She had nonobstructive disease at recent catheterization with an ejection fraction of 45%. Her last PCI was a drug-eluting stent to the circumflex with starting July of 2010. She is stable we will continue her current therapy. Her updated medication list for this problem includes:    Metoprolol Succinate 25 Mg Xr24h-tab (Metoprolol succinate) .Marland Kitchen... 1 by mouth once daily    Aspirin 325 Mg Tabs (Aspirin) ..... Once daily    Plavix 75 Mg Tabs (Clopidogrel bisulfate) .Marland Kitchen... Take 1 tablet by mouth once a day    Nitroglycerin 0.4 Mg Subl (Nitroglycerin) .Marland Kitchen... As needed  Problem # 2:  HYPERLIPIDEMIA-MIXED (ICD-272.4) This is currently being managed with simvastatin. Her updated medication list for this problem includes:    Simvastatin 40 Mg Tabs (Simvastatin) .Marland Kitchen... 1 by mouth at bedtime  Problem # 3:  CHRONIC OBSTRUCTIVE PULMONARY DISEASE, ACUTE EXACERBATION (ICD-491.21)  Her updated medication list for this problem includes:    Spiriva Handihaler 18 Mcg Caps (Tiotropium bromide monohydrate) .Marland Kitchen..Marland Kitchen Two puffs once daily    Advair Diskus 250-50 Mcg/dose Misc (Fluticasone-salmeterol) .Marland Kitchen... 1 puff q12 hours    Proair Hfa 108 (90 Base) Mcg/act Aers (Albuterol sulfate) ..... Inhale two puffs every 4 hours as needed    Prednisone 20 Mg Tabs (Prednisone) .Marland Kitchen...  1/2 by mouth daily  Problem # 4:  COPD (ICD-496) She has severe COPD and this is her main limiting symptom. This is managed by Dr. Vassie Loll Her updated medication list for this problem includes:    Spiriva Handihaler 18 Mcg Caps (Tiotropium bromide monohydrate) .Marland Kitchen..Marland Kitchen Two puffs once daily    Advair Diskus 250-50 Mcg/dose Misc (Fluticasone-salmeterol) .Marland Kitchen... 1 puff q12 hours    Proair Hfa 108 (90 Base) Mcg/act Aers (Albuterol sulfate) ..... Inhale two puffs every 4 hours as needed  Patient Instructions: 1)  Your physician recommends that you continue on your current medications as directed. Please refer to the Current Medication list given to you today. 2)  Your physician wants you to follow-up in: 6 months.  You will receive a reminder letter in the mail two months in advance. If you don't receive a letter, please call  our office to schedule the follow-up appointment. Prescriptions: PLAVIX 75 MG TABS (CLOPIDOGREL BISULFATE) Take 1 tablet by mouth once a day  #8 x 0   Entered by:   Sherri Rad, RN, BSN   Authorized by:   Lenoria Farrier, MD, [facility]   Signed by:   Lenoria Farrier, MD, [facility] on 01/15/2010   Method used:   Samples Given   RxID:   [id number]

## 2010-11-19 NOTE — Assessment & Plan Note (Signed)
Summary: abdominal pain/sheri   History of Present Illness Visit Type: Follow-up Visit Primary GI MD: Jamie Bison MD FACP FAGA Primary Provider: [name], MD Requesting Provider: na Chief Complaint: Lower abd pain, constipation, and bloating History of Present Illness:   Extremely complex 70 year old Caucasian female who has a large record,multiple medical problems, labs, consultation report, all reviewed today.  She has a history of recurrent diverticulitis since January he now presents with a one-week history of lower abdominal discomfort with worsening constipation. She apparently was seen in an emergency room in [name] and was told she had a UTI, has been treated with Keflex q.i.d. for the last week. She continues to have constant lower abdominal pain and constipation. It is of note that the patient has been using large amounts of ibuprofen and oxycodone for left shoulder pain. Apparently she has been scheduled for shoulder replacement by orthopedics. She denies rectal bleeding, melena, upper gastrointestinal, or hepatobiliary complaints.  She had colonoscopy in December 2007 and had removal of a dysplastic sigmoid colon polyp. She did not return for followup as requested.  The patient has multiple cardiovascular and pulmonary problems. Her Coronary Artery Disease is associated with previous MI in 1990, she also has asthmatic bronchitis and COPD, and is chronically on a aspirin therapy and multiple inhalants.Review of her records shows previous NSAID-induced gastric ulcer several years ago. Currently she denies dyspepsia, upper abdominal pain, or hepatobiliary complaints.   GI Review of Systems    Reports abdominal pain and  bloating.     Location of  Abdominal pain: lower abdomen.    Denies acid reflux, belching, chest pain, dysphagia with liquids, dysphagia with solids, heartburn, loss of appetite, nausea, vomiting, vomiting blood, weight loss, and  weight gain.     Reports diverticulosis.     Denies anal fissure, black tarry stools, change in bowel habit, constipation, diarrhea, fecal incontinence, heme positive stool, hemorrhoids, irritable bowel syndrome, jaundice, light color stool, liver problems, rectal bleeding, and  rectal pain.    Current Medications (verified): 1)  Spiriva Handihaler 18 Mcg Caps (Tiotropium Bromide Monohydrate) .... Two Puffs Once Daily 2)  Advair Diskus 250-50 Mcg/dose  Misc (Fluticasone-Salmeterol) .Marland Kitchen.. 1 Puff Q12 Hours 3)  Proair Hfa 108 (90 Base) Mcg/act Aers (Albuterol Sulfate) .... Inhale Two Puffs Every 4 Hours As Needed 4)  Metoprolol Succinate 25 Mg Xr24h-Tab (Metoprolol Succinate) .Marland Kitchen.. 1 By Mouth Once Daily(Lost Medicine Has Not Taken in Two Days Will Get Replaced and Resume 5)  Aspirin 81 Mg Tbec (Aspirin) .... Take One Tablet By Mouth Daily 6)  Simvastatin 40 Mg  Tabs (Simvastatin) .Marland Kitchen.. 1 By Mouth At Bedtime 7)  Fish Oil 1200 Mg Caps (Omega-3 Fatty Acids) .... Once Daily 8)  Tramadol Hcl 50 Mg Tabs (Tramadol Hcl) .... Take 1 Tab By Mouth At Bedtime 9)  Alendronate Sodium 70 Mg  Tabs (Alendronate Sodium) .Marland Kitchen.. 1 By Mouth Qwk 10)  Calcium-Vitamin D 500-200 Mg-Unit Tabs (Calcium-Vitamin D) .... Take 1 Tablet By Mouth Two Times A Day 11)  Vitamin D3 2000 Unit Caps (Cholecalciferol) .... Take 1 Tablet By Mouth Two Times A Day 12)  Multivitamins  Tabs (Multiple Vitamin) .... Take 1/2  Tablet By Mouth Two Times A Day 13)  Doqlace 100 Mg .... Take 1 Tablet By Mouth Once A Day 14)  Study Drug-For Stoke .... Take 1 Tablet By Mouth Once Daily 15)  Nitroglycerin 0.4 Mg Subl (Nitroglycerin) .... As Needed 16)  L-Lysine Hcl 500 Mg Tabs (Lysine Hcl) .Marland KitchenMarland KitchenMarland Kitchen  Once Daily or Every Other Day 17)  Odorless Garlic 1250 Mg Tabs (Garlic) .... Take 1 Tablet By Mouth Once A Day 18)  Allclear .... Otc 1 Tab Once Daily 19)  Oxygen .... 2 Liters At Bedtime 20)  Celebrex 200 Mg Caps (Celecoxib) .... Take 1 Tablet By Mouth Once A Day 21)  Urinary  Tract Health 365-50 Mg Caps (Cranberry-Olive Leaf) .... Take 1 Tablet By Mouth Once A Day 22)  Vicodin 5-500 Mg Tabs (Hydrocodone-Acetaminophen) .... Take 1/2 Tab By Mouth At Bedtime 23)  Prednisone 10 Mg Tabs (Prednisone) .... As Needed 24)  Zolpidem Tartrate 5 Mg Tabs (Zolpidem Tartrate) .... Take One Tab By Mouth At Bedtime As Needed For Sleep. 25)  Keflex(Unknown Dosage) .... One By Mouth Every Six Hours  Allergies (verified): 1)  ! Sulfa 2)  ! Codeine  Past History:  Past medical, surgical, family and social histories (including risk factors) reviewed for relevance to current acute and chronic problems.  Past Medical History: Right bimallleolar ankle fracture--12/2005 Right lateral ankle wound infection, probable methicillin sensitive staph aureua-- 01/2006 Coronary artery disease S/P MI 1990, S/P PTCA LAD 1999, S/P DES CFX 04/2009 Hyperlipidemia Osteopenia Tobacco abuse, hx of (D/Ced 10/20/1997) COPD      - Spirometry 07/25/09 FEV1 0.85(41%), FVC 2.09(72%), FEV1% 41 Hypoxemia      - Pulse ox 86% room air 07/25/09      - 2 liters with exertion and sleep  Cerebrovascular accident, hx of ,S/P TPA 04/20/2008, Dr Pearlean Brownie Diverticulitis, hx of, 11/09 Tubular Adenoma- high grade dysplasia 2007 BENIGN POSITIONAL VERTIGO (ICD-386.11) EMPHYSEMA (ICD-492.8) ACUTE GASTR ULCER W/PERF W/O MENTION OBSTRUCTION (ICD-531.10) ARTHRITIS, LEFT SHOULDER (ICD-716.91) GASTRIC ULCER (ICD-531.90) MYOCARDIAL INFARCTION, HX OF (ICD-412) Hypertension  Past Surgical History: Reviewed history from 09/22/2008 and no changes required. ORIF--Right ankle--01/2006 Hysterectomy--TAH/BSO Percutaneous transluminal coronary angioplasty Bilateral Breast Augmentation Endo 08/24/08 : duodenal polyp, Candida esophagitis,4 mm non bleeding antral ulcer, Dr Marina Goodell appendectomy vein stripping x 2 legs "[name]" (bladder)  Family History: Reviewed history from 09/22/2008 and no changes required. Family History  of Breast Cancer: Sister x 2 No FH of Colon Cancer: Family History of Pancreatic Cancer: 2nd cousin Family History of Colon Polyps: Sister Family History of Diabetes: Paternal Grandmother, Maternal Uncle Family History of Heart Disease: Mother, Father, Maternal Grandfather, Paternal Grandfather Family History of Uterine Cancer: Paternal Grandmother  Social History: Reviewed history from 08/27/2009 and no changes required. Retired--Realtor  Patient is a former smoker. -stopped in 1999 Alcohol Use - yes-occasion Daily Caffeine Use Illicit Drug Use - no Patient does not get regular exercise.   Review of Systems General:  Complains of fatigue, weakness, and malaise. CV:  Complains of dyspnea on exertion. Resp:  Denies dyspnea at rest, sputum, wheezing, coughing up blood, and pleurisy. GI:  Complains of nausea, jaundice, and constipation; denies difficulty swallowing, pain on swallowing, indigestion/heartburn, vomiting, vomiting blood, abdominal pain, gas/bloating, diarrhea, change in bowel habits, bloody BM's, black BMs, and fecal incontinence. GU:  Denies urinary burning, blood in urine, nocturnal urination, urinary frequency, urinary incontinence, abnormal vaginal bleeding, amenorrhea, menorrhagia, vaginal discharge, pelvic pain, genital sores, painful intercourse, and decreased libido. MS:  Complains of joint swelling, joint stiffness, and joint deformity; denies joint pain / LOM, low back pain, muscle weakness, muscle cramps, muscle atrophy, leg pain at night, leg pain with exertion, and shoulder pain / LOM hand / wrist pain (CTS); Chronic left shoulder pain with planned surgery by orthopedics.. Neuro:  Denies weakness, paralysis, abnormal sensation, seizures, syncope,  tremors, vertigo, transient blindness, frequent falls, frequent headaches, difficulty walking, headache, sciatica, radiculopathy other:, restless legs, memory loss, and confusion. Endo:  Denies cold intolerance, heat  intolerance, polydipsia, polyphagia, polyuria, unusual weight change, and hirsutism. Heme:  Complains of bruising.  Vital Signs:  Patient profile:   70 year old female Height:      65 inches Weight:      171 pounds BMI:     28.56 BSA:     1.85 Temp:     98.2 degrees F oral Pulse rate:   96 / minute Pulse rhythm:   regular BP sitting:   136 / 84  (left arm) Cuff size:   regular  Vitals Entered By: Ok Anis CMA (September 17, 2010 9:00 AM)  Physical Exam  General:  Well developed, well nourished, no acute distress.obese.  obese.   Head:  Normocephalic and atraumatic. Eyes:  PERRLA, no icterus.exam deferred to patient's ophthalmologist.  exam deferred to patient's ophthalmologist.   Neck:  Supple; no masses or thyromegaly. Lungs:  Clear throughout to auscultation.decreased BS on L, decreased BS on R, and rhonchi bilateral.  decreased BS on L and decreased BS on R.   Heart:  Regular rate and rhythm; no murmurs, rubs,  or bruits. Abdomen:  Soft, nontender and nondistended. No masses, hepatosplenomegaly or hernias noted. Normal bowel sounds.Tenderness noted lower abdominal areas without rebound tenderness. Bowel sounds are normal. Rectal:  Normal exam.Digital exam shows hard almost impacted stool in the rectal vault which is guaiac-negative. I cannot appreciate any abscesses or focal areas of tenderness. Msk:  Symmetrical with no gross deformities. Normal posture.decreased ROM and arthritic changes.   Pulses:  Normal pulses noted. Extremities:  No clubbing, cyanosis, edema or deformities noted. Neurologic:  Alert and  oriented x4;  grossly normal neurologically. Axillary Nodes:  No significant axillary adenopathy. Psych:  Alert and cooperative. Normal mood and affect.anxious.     Impression & Recommendations:  Problem # 1:  ABDOMINAL PAIN, LEFT LOWER QUADRANT (ICD-789.04) Assessment Deteriorated Probable recurrent diverticulitis which has not responded to Keflex therapy. Labs and  CT scan and repeat urine culture have been ordered. I have changed her to Cipro 500 mg twice a day, Metronidazole 500 mg twice a day for 10 days. We also have prescribed Nucynta 100 mg every 8 hours as needed for pain. I have asked the patient to give her self a fleets enema per impaction. She apparently is already on stools ulcers. Her constipation has been exacerbated by her use of oxycodone for her left shoulder pain. She also may have NSAID-induced damage to her GI tract. Review of her previous colonoscopy shows a dysplastic polyp which Will require followup colonoscopy. She is to see me back in 2 weeks' time pending her CT scan results. Because of her NSAID use and history of peptic ulcer disease, I have also placed her on Dexilant 60 mg a day. TLB-CBC Platelet - w/Differential (85025-CBCD) TLB-BMP (Basic Metabolic Panel-BMET) (80048-METABOL) TLB-Hepatic/Liver Function Pnl (80076-HEPATIC) TLB-TSH (Thyroid Stimulating Hormone) (84443-TSH) TLB-B12, Serum-Total ONLY (14782-N56) TLB-Ferritin (82728-FER) TLB-Folic Acid (Folate) (82746-FOL) TLB-IBC Pnl (Iron/FE;Transferrin) (83550-IBC) TLB-CRP-High Sensitivity (C-Reactive Protein) (86140-FCRP) TLB-Sedimentation Rate (ESR) (85652-ESR) T-Culture, Urine (21308-65784) TLB-Udip w/ Micro (81001-URINE) CT Abdomen/Pelvis with Contrast (CT Abd/Pelvis w/con)  Problem # 2:  EMPHYSEMA (ICD-492.8) Assessment: Unchanged Continue multiple inhalers and other medications per Dr. [name] and pulmonary medicine.  Problem # 3:  CAD, NATIVE VESSEL (ICD-414.01) Assessment: Unchanged She appears stable from a cardiovascular standpoint and is followed closely by cardiology.  Problem #  4:  CVA (ICD-434.91) Assessment: Unchanged continue aspirin 81 mg a day with PPI coverage.  Problem # 5:  PERSONAL HX COLONIC POLYPS (ICD-V12.72) Assessment: Comment Only  Patient Instructions: 1)  Copy sent to : [place], MD and [name] in pulmonary medicine and Dr. Charlies Constable and cardiology. 2)  Your prescription(s) have been sent to you pharmacy.  3)  Please go to the basement today for your labs.  4)  Your CT Scan is today at 3pm, please follow the seperate instructions.  5)  The medication list was reviewed and reconciled.  All changed / newly prescribed medications were explained.  A complete medication list was provided to the patient / caregiver. 6)  Please schedule a follow-up appointment in 2 weeks.  7)  Fleets Enema to be administered by patient at home 8)  Colonoscopy followup in next several months. Prescriptions: METRONIDAZOLE 500 MG TABS (METRONIDAZOLE) take one by mouth two times a day for 10 days  #20 x 0   Entered by:   Harlow Mares CMA (AAMA)   Authorized by:   Mardella Layman MD [name]   Signed by:   Mardella Layman MD [name] on 09/17/2010   Method used:   Electronically to        [facility] [id number] (retail)       [address] [place], [place]  [address]       Ph: [phone]       Fax: [phone]   RxID:   [id number] CIPROFLOXACIN HCL 500 MG TABS (CIPROFLOXACIN HCL) take one by mouth two times a day for 10 days  #20 x 0   Entered by:   Harlow Mares CMA (AAMA)   Authorized by:   Mardella Layman MD [name]   Signed by:   Mardella Layman MD [name] on 09/17/2010   Method used:   Electronically to        [phone]* (retail)       [address] [place], [place]  [address]       Ph: [phone]       Fax: [phone]   RxID:   [id number] NUCYNTA 100 MG TABS (TAPENTADOL HCL) take one by mouth two times a day  #60 x 0   Entered by:   Harlow Mares CMA (AAMA)   Authorized by:   Mardella Layman MD [name]   Signed by:   Harlow Mares CMA (AAMA) on 09/17/2010   Method used:   Print then Give to Patient   RxID:   [id number]

## 2010-11-19 NOTE — Letter (Signed)
Summary: Screening/Insulin Resistance Intervention After Stroke Trial (IR  Screening/Insulin Resistance Intervention After Stroke Trial (IRIS)   Imported By: Lanelle Bal 09/03/2010 09:59:01  _____________________________________________________________________  External Attachment:    Type:   Image     Comment:   External Document

## 2010-11-19 NOTE — Progress Notes (Signed)
Summary: medication needed  Phone Note Call from Patient Call back at Home Phone [phone] Call back at [phone]   Caller: Patient Call For: Dr. Vassie Loll Reason for Call: Talk to Nurse Summary of Call: Patients states she is out of town and left her meds home. She would like a friend to pick up a sample of ProAir, Spiriva and Advair 250/50, this needs to be picked up today per pt because her friend is leaving in the Morning at 7am to bring it to her,   She would like like to know if we could deliver theese samples to Dr. [name] of[facility] so her friend does not have to go all the way to [place]. Initial call taken by: Denna Haggard, CMA,  February 05, 2010 1:02 PM  Follow-up for Phone Call        Patient called back and stated she needs meds delivered to Dr. Frederik Pear of[facility] B4 5:00pm today. Denna Haggard, CMA  February 05, 2010 2:14 PM  Follow-up by: Denna Haggard, CMA,  February 05, 2010 2:14 PM  Additional Follow-up for Phone Call Additional follow up Details #1::        Spoke with pt, she states she does nto want meds delivered to Dr. Caryl Never office, she wants [place] to call Dr. Caryl Never office to request samples because this is closer for her friend to get to. I advised that pt would need to call Dr. Caryl Never office to request this. She states they will not give her any because she has not seen him in a while. I said me calling would not change that. I advised I could leave her samples from our office. She states that would be fine, she will her her friend come by and pick them up. Carron Curie CMA  February 05, 2010 3:08 PM

## 2010-11-19 NOTE — Progress Notes (Signed)
Summary: abx  Phone Note Call from Patient Call back at Home Phone [phone]   Caller: Patient Call For: clance Summary of Call: pt received rx for CEFDINIR 300mg  when she saw kc. pt says she is "better" but wants to have another rx of this to take until she sees dr Vassie Loll wed so she doesn't get worse. [facility]. please call pt before this is called in.  Initial call taken by: Tivis Ringer, CNA,  April 01, 2010 12:21 PM  Follow-up for Phone Call        Pt finished Cefdinir yesterday and staes she has had some inprovement, but is not completely better. She still has productive cough with yellow phlegm, head congestion. Sob has improved some as well as wheezing, but she does not feel 1005. She has an appt with RA on Wed at 11am, but is requesting some more Cefdinir to last until appt with RA, because she fears she will get worse again by Wed. Please advise. Carron Curie CMA  April 01, 2010 12:49 PM   Additional Follow-up for Phone Call Additional follow up Details #1::        She is not going to feel 100% this fast.  let her know the abx keeps working in her system for longer than just the 7days.  I would prefer she continue with current plan and see Dr. Vassie Loll on wed since she is better from my visit. Additional Follow-up by: Barbaraann Share MD,  April 01, 2010 5:51 PM    Additional Follow-up for Phone Call Additional follow up Details #2::    called and spoke with pt.  pt aware of KC's response and recs.  pt verbalized understanding and denied any questions.  Arman Filter LPN  April 01, 2010 5:56 PM

## 2010-11-19 NOTE — Progress Notes (Signed)
Summary: diet requested  Phone Note Call from Patient Call back at Home Phone [phone]   Caller: Patient Reason for Call: Talk to Nurse Summary of Call: Patient did not receive copy of diet plan she requested at appt.  Can you email to [email or web address] or fax to [phone] --will need cover sheet.  Initial call taken by: Burnard Leigh,  January 16, 2010 1:26 PM  Follow-up for Phone Call        I called the pt. She would like to know what particular diet plan she can follow in order to lose weight. I explained to the pt that I would discuss with Dr. Juanda Chance what particular diet he thinks might be good for her and I will try to mail her some information. The pt is agreeable. Follow-up by: Sherri Rad, RN, BSN,  January 16, 2010 4:46 PM  Additional Follow-up for Phone Call Additional follow up Details #1::        I disussed this with Dr. Juanda Chance. He recommends the [employer] Diet or [employer]. I will call the pt with this information next week.  Additional Follow-up by: Sherri Rad, RN, BSN,  January 18, 2010 6:27 PM    Additional Follow-up for Phone Call Additional follow up Details #2::    I called and left a message on the pt's identified voice mail regarding both these diets and instructed her that there are books available or the internet.  Follow-up by: Sherri Rad, RN, BSN,  January 25, 2010 3:28 PM

## 2010-11-19 NOTE — Letter (Signed)
Summary: CMN for Oxygen/[facility]  CMN for Oxygen/[facility]   Imported By: Sherian Rein 09/04/2010 10:44:35  _____________________________________________________________________  External Attachment:    Type:   Image     Comment:   External Document

## 2010-11-19 NOTE — Miscellaneous (Signed)
Summary: Flu/[employer]  Flu/[employer]   Imported By: Lanelle Bal 08/28/2010 08:38:46  _____________________________________________________________________  External Attachment:    Type:   Image     Comment:   External Document  Appended Document: surgical clearance/mhh    Clinical Lists Changes  Orders: Added new Service order of Pneumococcal Vaccine (16109) - Signed Added new Service order of Admin 1st Vaccine (60454) - Signed Observations: Added new observation of PNEUMOVAXLOT: 1137AA (08/30/2010 17:10) Added new observation of PNEUMOVAXEXP: 02/13/2012 (08/30/2010 17:10) Added new observation of PNEUMOVAXBY: Michel Bickers CMA (08/30/2010 17:10) Added new observation of PNEUMOVAXRTE: IM (08/30/2010 17:10) Added new observation of PNEUMOVAXDOS: 0.5 ml (08/30/2010 17:10) Added new observation of PNEUMOVAXMFR: Merck (08/30/2010 17:10) Added new observation of PNEUMOVAXSIT: left deltoid (08/30/2010 17:10) Added new observation of PNEUMOVAX: Pneumovax (Medicare) (08/30/2010 17:10)       Immunizations Administered:  Pneumonia Vaccine:    Vaccine Type: Pneumovax (Medicare)    Site: left deltoid    Mfr: Merck    Dose: 0.5 ml    Route: IM    Given by: Michel Bickers CMA    Exp. Date: 02/13/2012    Lot #: [id number]

## 2010-11-19 NOTE — Miscellaneous (Signed)
Summary: Orders Update pft charges  Clinical Lists Changes  Orders: Added new Service order of Carbon Monoxide diffusing w/capacity (94720) - Signed Added new Service order of Lung Volumes (94240) - Signed Added new Service order of Spirometry (Pre & Post) (94060) - Signed 

## 2010-11-19 NOTE — Progress Notes (Signed)
Summary: question on meds   Phone Note Call from Patient Call back at Home Phone [phone]   Caller: Patient Reason for Call: Talk to Nurse Summary of Call: pt question question on meds. Initial call taken by: Roe Coombs,  August 23, 2010 1:32 PM  Follow-up for Phone Call        [facility]. Sherri Rad, RN, BSN  August 23, 2010 4:33 PM   I spoke with the pt. She was prescribed the generic of ambien back in 2008 by Dr. Juanda Chance. She is requesting a refill. I will send this in to [facility] for her.  I cannot send this in electronically. I will have to try to call monday or have another MD sign for this since Dr. Juanda Chance will be out of the office all week. Follow-up by: Sherri Rad, RN, BSN,  August 23, 2010 6:35 PM  Additional Follow-up for Phone Call Additional follow up Details #1::        I called and spoke with the pharmacist at [facility] and called in the pt's RX. Additional Follow-up by: Sherri Rad, RN, BSN,  August 26, 2010 2:50 PM    New/Updated Medications: ZOLPIDEM TARTRATE 5 MG TABS (ZOLPIDEM TARTRATE) take one tab by mouth at bedtime as needed for sleep. Prescriptions: ZOLPIDEM TARTRATE 5 MG TABS (ZOLPIDEM TARTRATE) take one tab by mouth at bedtime as needed for sleep.  #30 x 0   Entered by:   Sherri Rad, RN, BSN   Authorized by:   Lenoria Farrier, MD, [facility]   Signed by:   Sherri Rad, RN, BSN on 08/26/2010   Method used:   Telephoned to ...       [facility] (mail-order)             , [place]         Ph: [phone]       Fax: [phone]   RxID:   [id number]

## 2010-11-19 NOTE — Assessment & Plan Note (Signed)
Summary: Recurrent diverticulitis/dfs   History of Present Illness Visit Type: Follow-up Visit Primary GI MD: Sheryn Bison MD FACP FAGA Primary Provider: [name], MD Chief Complaint: F/u for diverticulitis flare up. Post hospital for diverticulitis. Pt states since last Friday she has had water stools. Pt states yesterday she started having more formed stools but still mushy. Pt denies any abd pain since the hospital. Pt is still on ATBs.  History of Present Illness:   70 year old Caucasian female hospitalized from January 16 to January 20 and [facility] for acute diverticulitis confirmed by CT scan. She is now on Cipro and metronidazole 500 mg twice a day with rather marked symptomatic improvement. She has some loose stools but this is improving and her abdominal pain is almost completely resolved.  Her past medical history is remarkable for COPD, emphysema, atherosclerosis and previous coronary artery stenting in July with subsequent Plavix and aspirin use. She also has a history of a mild CVA and chronic diastolic dysfunction followed by Dr. Charlies Constable. She currently denies cardiovascular problems but does have dyspnea on exertion. Last colonoscopy exam several years ago and she did have extensive diverticulosis and recurrent colon polyps. Family history is noncontributory. She denies fever, chills, nausea vomiting, upper gastrointestinal or hepatobiliary complaints at this time. She is back to her regular diet and denies any specific food intolerances.   GI Review of Systems      Denies acid reflux, belching, bloating, chest pain, dysphagia with liquids, dysphagia with solids, heartburn, loss of appetite, nausea, vomiting, vomiting blood, weight loss, and  weight gain.      Reports diarrhea and  diverticulosis.     Denies anal fissure, black tarry stools, change in bowel habit, constipation, fecal incontinence, heme positive stool, hemorrhoids, irritable bowel syndrome,  jaundice, light color stool, liver problems, rectal bleeding, and  rectal pain.    Current Medications (verified): 1)  Spiriva Handihaler 18 Mcg Caps (Tiotropium Bromide Monohydrate) .... One Puff Once Daily 2)  Advair Diskus 250-50 Mcg/dose  Misc (Fluticasone-Salmeterol) .Marland Kitchen.. 1 Puff Q12 Hours 3)  Proventil Hfa 108 (90 Base) Mcg/act Aers (Albuterol Sulfate) .... Inhale 2 Puffs Every Four Hours As Needed 4)  Xopenex 0.63 Mg/49ml Nebu (Levalbuterol Hcl) .... As Needed 5)  Metoprolol Succinate 25 Mg Xr24h-Tab (Metoprolol Succinate) .Marland Kitchen.. 1 By Mouth Once Daily 6)  Aspirin 325 Mg Tabs (Aspirin) .... Once Daily 7)  Plavix 75 Mg Tabs (Clopidogrel Bisulfate) .... Take 1 Tablet By Mouth Once A Day 8)  Simvastatin 40 Mg  Tabs (Simvastatin) .Marland Kitchen.. 1 By Mouth At Bedtime 9)  Fish Oil 1200 Mg Caps (Omega-3 Fatty Acids) .... Once Daily 10)  Tramadol Hcl 50 Mg Tabs (Tramadol Hcl) .Marland Kitchen.. 1 Q 6-8 Hrs As Needed 11)  Alendronate Sodium 70 Mg  Tabs (Alendronate Sodium) .Marland Kitchen.. 1 By Mouth Qwk 12)  Calcium With Vitamin D, K, Magnesium and Zinc .... Take 1 Tablet By Mouth  Two Times A Day 13)  Vitamin D3 1000 Unit Caps (Cholecalciferol) .Marland Kitchen.. 1 Tab Two Times A Day 14)  Multivitamins  Tabs (Multiple Vitamin) .... Take 1 Tablet By Mouth Once A Day 15)  B-100 Complex  Tabs (Vitamins-Lipotropics) .... Take 1 Tablet By Mouth Once A Day 16)  Colace 100 Mg Caps (Docusate Sodium) .... Take 1 Tablet By Mouth Once A Day 17)  Study Drug-For Stoke .... Take 1 Tablet By Mouth Once Daily 18)  Nitroglycerin 0.4 Mg Subl (Nitroglycerin) .... As Needed 19)  L-Lysine Hcl 500 Mg Tabs (  Lysine Hcl) .... Once Daily or Every Other Day 20)  Odorless Garlic 1250 Mg Tabs (Garlic) .... Once Daily 21)  Allrest .... Otc 1 Tab Once Daily 22)  Cipro 500 Mg Tabs (Ciprofloxacin Hcl) .... Take 1 Tablet By Mouth Two Times A Day 23)  Metronidazole 500 Mg Tabs (Metronidazole) .... Take 1 Tablet By Mouth Every 8 Hours  Allergies (verified): 1)  ! Sulfa 2)  !  Codeine  Past History:  Past medical, surgical, family and social histories (including risk factors) reviewed for relevance to current acute and chronic problems.  Past Medical History: Reviewed history from 07/25/2009 and no changes required. Right bimallleolar ankle fracture--12/2005 Right lateral ankle wound infection, probable methicillin sensitive staph aureua-- 01/2006 Coronary artery disease S/P MI 1990, S/P PTCA LAD 1999, S/P DES CFX 04/2009 Hyperlipidemia Osteopenia Tobacco abuse, hx of (D/Ced 10/20/1997) COPD      - Spirometry 07/25/09 FEV1 0.85(41%), FVC 2.09(72%), FEV1% 41 Hypoxemia      - Pulse ox 86% room air 07/25/09      - 2 liters with exertion and sleep  Cerebrovascular accident, hx of ,S/P TPA 04/20/2008, Dr Pearlean Brownie Diverticulitis, hx of, 11/09 Tubular Adenoma- high grade dysplasia 2007  Past Surgical History: Reviewed history from 09/22/2008 and no changes required. ORIF--Right ankle--01/2006 Hysterectomy--TAH/BSO Percutaneous transluminal coronary angioplasty Bilateral Breast Augmentation Endo 08/24/08 : duodenal polyp, Candida esophagitis,4 mm non bleeding antral ulcer, Dr Marina Goodell appendectomy vein stripping x 2 legs "martial megeti" (bladder)  Family History: Reviewed history from 09/22/2008 and no changes required. Family History of Breast Cancer: Sister x 2 No FH of Colon Cancer: Family History of Pancreatic Cancer: 2nd cousin Family History of Colon Polyps: Sister Family History of Diabetes: Paternal Grandmother, Maternal Uncle Family History of Heart Disease: Mother, Father, Maternal Grandfather, Paternal Grandfather Family History of Uterine Cancer: Paternal Grandmother  Social History: Reviewed history from 08/27/2009 and no changes required. Patient is a former smoker. -stopped in 1999 Alcohol Use - yes-occasion Daily Caffeine Use Illicit Drug Use - no Patient does not get regular exercise.  retired Veterinary surgeon  Review of Systems       The patient  complains of fatigue, headaches-new, and shortness of breath.  The patient denies allergy/sinus, anemia, anxiety-new, arthritis/joint pain, back pain, blood in urine, breast changes/lumps, change in vision, confusion, cough, coughing up blood, depression-new, fainting, fever, hearing problems, heart murmur, heart rhythm changes, itching, menstrual pain, muscle pains/cramps, night sweats, nosebleeds, pregnancy symptoms, skin rash, sleeping problems, sore throat, swelling of feet/legs, swollen lymph glands, thirst - excessive , urination - excessive , urination changes/pain, urine leakage, vision changes, and voice change.    Vital Signs:  Patient profile:   70 year old female Height:      64 inches Weight:      166.38 pounds BMI:     28.66 Pulse rate:   80 / minute Pulse rhythm:   regular BP sitting:   110 / 68  (left arm) Cuff size:   regular  Vitals Entered By: Christie Nottingham CMA Duncan Dull) (November 15, 2009 11:30 AM)  Physical Exam  General:  Well developed, well nourished, no acute distress.healthy appearing.   Head:  Normocephalic and atraumatic. Eyes:  PERRLA, no icterus.exam deferred to patient's ophthalmologist.   Lungs:  Clear throughout to auscultation.decreased BS on L and decreased BS on R.   Heart:  Regular rate and rhythm; no murmurs, rubs,  or bruits. Abdomen:  Soft, nontender and nondistended. No masses, hepatosplenomegaly or hernias noted. Normal bowel sounds.There  no masses or tenderness in the lower abdomen at this time. Bowel sounds are normal. I cannot appreciate an abdominal bruit. Extremities:  No clubbing, cyanosis, edema or deformities noted. Neurologic:  Alert and  oriented x4;  grossly normal neurologically. Inguinal Nodes:  No significant inguinal adenopathy. Psych:  Alert and cooperative. Normal mood and affect.   Impression & Recommendations:  Problem # 1:  DIVERTICULITIS-COLON (ICD-562.11) Assessment Improved She is to finish her Cipro and metronidazole and  I have added Florstar daily to her regime. I will see her back in 2 weeks' time and discuss colonoscopy followup. I see no need for repeat laboratory testing at this time. I have given her a prescription for metronidazole and Cipro to keep on hand in the future should she have problems and medical care is unavailable. This was given to her at her request.  Problem # 2:  PERSONAL HX COLONIC POLYPS (ICD-V12.72) Assessment: Unchanged Colonoscopy followup once diverticulitis has completely resolved. She is to advance her diet to high-fiber foods as tolerated  Problem # 3:  ABDOMINAL PAIN-GENERALIZED (ICD-789.07) Assessment: Improved  Problem # 4:  CORONARY ARTERY DISEASE (ICD-414.00) Assessment: Improved Continue Plavix, aspirin, and pulmonary inhalers as per Dr. [name] and Dr. Charlies Constable in cardiology.  Patient Instructions: 1)  Please continue current medications.  2)  Daily Florstar probiotic therapy 3)  Please schedule a follow-up appointment in 2 weeks.  4)  Diet should be high in fiber ( fruits, vegetables, whole grains) but low in residue. Drink at least eight (8) glasses of water a day.  5)  Diverticular Disease brochure given.  6)  Copy sent to : Dr. [name] and Dr. Charlies Constable 7)  The medication list was reviewed and reconciled.  All changed / newly prescribed medications were explained.  A complete medication list was provided to the patient / caregiver.  Appended Document: Recurrent diverticulitis/dfs    Clinical Lists Changes  Medications: Added new medication of METRONIDAZOLE 500 MG  TABS (METRONIDAZOLE) Take 1 twice a day times 10 days - Signed Added new medication of CIPROFLOXACIN HCL 500 MG  TABS (CIPROFLOXACIN HCL) Take 1 twice a day times 10 days - Signed Added new medication of FLORASTOR 250 MG CAPS (SACCHAROMYCES BOULARDII) 1 by mouth qd Rx of METRONIDAZOLE 500 MG  TABS (METRONIDAZOLE) Take 1 twice a day times 10 days;  #20 x 0;  Signed;  Entered by: Ashok Cordia RN;  Authorized by: Mardella Layman MD [facility];  Method used: Print then Give to Patient Rx of CIPROFLOXACIN HCL 500 MG  TABS (CIPROFLOXACIN HCL) Take 1 twice a day times 10 days;  #20 x 0;  Signed;  Entered by: Ashok Cordia RN;  Authorized by: Mardella Layman MD [facility];  Method used: Print then Give to Patient    Prescriptions: CIPROFLOXACIN HCL 500 MG  TABS (CIPROFLOXACIN HCL) Take 1 twice a day times 10 days  #20 x 0   Entered by:   Ashok Cordia RN   Authorized by:   Mardella Layman MD [facility]   Signed by:   Ashok Cordia RN on 11/15/2009   Method used:   Print then Give to Patient   RxID:   [id number] METRONIDAZOLE 500 MG  TABS (METRONIDAZOLE) Take 1 twice a day times 10 days  #20 x 0   Entered by:   Ashok Cordia RN   Authorized by:   Mardella Layman MD [facility]   Signed by:   Ashok Cordia RN on 11/15/2009  Method used:   Print then Give to Patient   RxID:   [id number]

## 2010-11-19 NOTE — Progress Notes (Signed)
Summary: questions  Phone Note Outgoing Call   Call placed by: Comer Locket. Vassie Loll MD,  December 29, 2009 9:22 AM Details for Reason: informed about PFTS Summary of Call: Pt informed. Pt states she has never had a breathing test done before and would like to know what it was compared to. Please advise. jwr  Let her know she had a spirometry test during a visit with Dr Craige Cotta  on 07/25/09 Initial call taken by: Comer Locket. Vassie Loll MD,  December 29, 2009 9:22 AM  Follow-up for Phone Call        [facility] x 1 Zackery Barefoot [facility]  December 31, 2009 9:15 AM   pt returned call to [place].  Please return call to [phone]. Follow-up by: Eugene Gavia,  December 31, 2009 9:47 AM  Additional Follow-up for Phone Call Additional follow up Details #1::        Pt informed. Pt has a follow up appointment scheduled at the end of this month, however, wants to know what can she do in the meantime about "choking" and not being able to breathe when she is getting in and out of the car. Or if she needs to get a second opinion.... Pt states she is taking all inhaled medications as directed. Please advise. Zackery Barefoot CMA  December 31, 2009 9:54 AM     Additional Follow-up for Phone Call Additional follow up Details #2::    Happy to refer her for a second opinion to MD of choice. Would she prefer to go to [facility] or [facility]? Follow-up by: [name] MD,  January 01, 2010 6:21 PM  Additional Follow-up for Phone Call Additional follow up Details #3:: Details for Additional Follow-up Action Taken: Pt informed of above and stated "okay thank you so much for calling me" and hung up. Zackery Barefoot CMA  January 07, 2010 12:06 PM   Does not desire second opinion now. Will jojn gym. Discussed adverse effects of prednsione. Additional Follow-up by: Comer Locket. Vassie Loll MD,  January 11, 2010 3:09 PM

## 2010-11-19 NOTE — Progress Notes (Signed)
Summary: Ok for avelox x 5 or doxy x 10 - FYI  Phone Note Call from Patient   Caller: Patient Call For: Ayaana Biondo Summary of Call: NEED SOMETHING FOR BRONCHITIS PHARMACY [facility] [place]  [phone] Initial call taken by: Rickard Patience,  January 28, 2010 10:13 AM  Follow-up for Phone Call        Avelox was called in on 01/22/10 to [facility] # 50 x 0 by RA.  Pt states this was too expensive so she did not pick up the avelox but she did pick up cough med.  Requesting avelox to be changed to something cheaper ( should #50 have been sent?).  Informed pt RA out of office today but will talk with doc of the day - will forward to MW - pls advise.  Thanks!  Pt states she is not in [place]-requesting abx to be called into [facility] in [place]. Follow-up by: Gweneth Dimitri RN,  January 28, 2010 11:39 AM  Additional Follow-up for Phone Call Additional follow up Details #1::        either avelox x 5 days or doxy x 10 (one two times a day) Additional Follow-up by: Nyoka Cowden MD,  January 28, 2010 1:18 PM    Additional Follow-up for Phone Call Additional follow up Details #2::    called, spoke with pt.  Pt informed of above recs per MW.  Per pt, she would like to try doxy bc avelox was over $7 a piece for 10 tablets.  Doxy Rx called into [facility] in [place] to [place], Pharmacist.  Pt aware ex has been called in.  Will forward message to RA as FYI.  Gweneth Dimitri RN  January 28, 2010 2:31 PM   FYI  --#50 tabs of avelox was an error - should have been 5 Doxy ok  Follow-up by: Comer Locket. Vassie Loll MD,  January 30, 2010 9:17 PM  New/Updated Medications: DOXYCYCLINE HYCLATE 100 MG CAPS (DOXYCYCLINE HYCLATE) take 1 by mouth two times a day x 10 days Prescriptions: DOXYCYCLINE HYCLATE 100 MG CAPS (DOXYCYCLINE HYCLATE) take 1 by mouth two times a day x 10 days  #20 x 0   Entered by:   [name] MD   Authorized by:   Nyoka Cowden MD   Signed by:   [name] MD on 01/30/2010   Method used:   Telephoned to ...      [facility] [phone]* (retail)       [address] [place], [place]  [address]       Ph: [phone]       Fax: [phone]   RxID:   [id number]

## 2010-11-19 NOTE — Assessment & Plan Note (Signed)
Summary: copd/switching to RA from S[facility]/jwr   Visit Type:  Follow-up Copy to:  Dr. Charlies Constable Primary Provider/Referring Provider:  [name], MD  CC:  Transfer pt from Dr. Craige Cotta to Dr. Vassie Loll.  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 She smoked for 40 years and quit in 1999. She has been on advair, and this helps.  She used symbicort before, but felt that advair worked better. She has been on multiple rescue inhalers, and these help some. Uses husband's nebuliser with xopenex nebs . She had a cardiac stent in 7/10, but did not notice significant improvement in her breathing after this. Echo 7/10 >> septal & apical akinesis, EF 45-50% but subsequent cath, stent,  EF was lower.  August 27, 2009  Acute visit for 'bronchitis, started as a sore throat' -took 3 tabs of avelox that Dr [name] had given her a year ago , needs 'hydromet' cough syrup - this is the only one that works!  She denies animal exposures, or recent sick exposures.Yellow phlegm has subsided >> improved with avelox & low dose steroids.  November 09, 2009 3:51 PM  Accompanied by daughter Gerri Spore ER RN) who wishes to optimise her 'quality of life'.. Adm [facility] for diverticulitis, now abdominal pain resolved. Episode of chest pain in the hospital without EKG changes & neg enzymes. Recently completed cardiac rehab without much benefit. Will not use portable O2 at work >> but did not desaturate on exertion today. Nocturnal O2 helps.  Pro- air expensive    Current Medications (verified): 1)  Spiriva Handihaler 18 Mcg Caps (Tiotropium Bromide Monohydrate) .... One Puff Once Daily 2)  Advair Diskus 250-50 Mcg/dose  Misc (Fluticasone-Salmeterol) .Marland Kitchen.. 1 Puff Q12 Hours 3)  Proventil Hfa 108 (90 Base) Mcg/act Aers (Albuterol Sulfate) .... Inhale 2 Puffs Every Four Hours As Needed 4)  Xopenex 0.63 Mg/8ml Nebu (Levalbuterol Hcl) .... As Needed 5)  Metoprolol  Succinate 25 Mg Xr24h-Tab (Metoprolol Succinate) .Marland Kitchen.. 1 By Mouth Once Daily 6)  Aspirin 325 Mg Tabs (Aspirin) .... Once Daily 7)  Plavix 75 Mg Tabs (Clopidogrel Bisulfate) .... Take 1 Tablet By Mouth Once A Day 8)  Simvastatin 40 Mg  Tabs (Simvastatin) .Marland Kitchen.. 1 By Mouth At Bedtime 9)  Fish Oil 1200 Mg Caps (Omega-3 Fatty Acids) .... Once Daily 10)  Tramadol Hcl 50 Mg Tabs (Tramadol Hcl) .Marland Kitchen.. 1 Q 6-8 Hrs As Needed 11)  Zolpidem Tartrate 5 Mg Tabs (Zolpidem Tartrate) .... One Tab By Mouth At Bedtime As Needed Sleep 12)  Alendronate Sodium 70 Mg  Tabs (Alendronate Sodium) .Marland Kitchen.. 1 By Mouth Qwk 13)  Calcium With Vitamin D, K, Magnesium and Zinc .... Take 1 Tablet By Mouth  Two Times A Day 14)  Vitamin D3 1000 Unit Caps (Cholecalciferol) .Marland Kitchen.. 1 Tab Two Times A Day 15)  Multivitamins  Tabs (Multiple Vitamin) .... Take 1 Tablet By Mouth Once A Day 16)  B-100 Complex  Tabs (Vitamins-Lipotropics) .... Take 1 Tablet By Mouth Once A Day 17)  Dual Action Colon Cleanse and Body Purifier .... Take 1 Tab Two Times A Day 18)  Study Drug-For Stoke .... Take 1 Tablet By Mouth Once Daily 19)  Nitroglycerin 0.4 Mg Subl (Nitroglycerin) .... As Needed 20)  L-Lysine Hcl 500 Mg Tabs (Lysine Hcl) .... Once Daily or Every Other Day 21)  Odorless Garlic 1250 Mg Tabs (Garlic) .... Once Daily 22)  Allrest .... Otc 1 Tab  Once Daily  Allergies (verified): 1)  ! Sulfa 2)  ! 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 -stopped in 1999 Alcohol Use - yes-occasion Daily Caffeine Use Illicit Drug Use - no Patient does not get regular exercise.  retired Veterinary surgeon  Review of Systems       The patient complains of dyspnea on exertion.  The patient denies anorexia, fever, weight loss, weight gain, vision loss, decreased hearing, hoarseness, chest pain, syncope, peripheral edema, prolonged cough, headaches, hemoptysis, abdominal pain, melena, hematochezia, severe indigestion/heartburn, hematuria, muscle weakness, suspicious skin lesions, difficulty walking, depression, unusual weight change, and abnormal bleeding.    Vital Signs:  Patient profile:   70 year old female Height:      64 inches Weight:      168 pounds O2 Sat:      91 % on Room air Temp:     97.4 degrees F oral Pulse rate:   112 / minute BP sitting:   110 / 72  (left arm) Cuff size:   regular  Vitals Entered By: Zackery Barefoot CMA (November 09, 2009 3:40 PM)  O2 Flow:  Room air  Serial Vital Signs/Assessments:  Comments: 4:35 PM Ambulatory Pulse Oximetry  Resting; HR_95____    02 Sat__94___  Lap1 (185 feet)   HR_103____   02 Sat__95___ Lap2 (185 feet)   HR_103____   02 Sat__93___    Lap3 (185 feet)   HR__103___   02 Sat__91___  _x__Test Completed without Difficulty ___Test Stopped due to:  By: Michel Bickers CMA   CC: Transfer pt from Dr. Craige Cotta to Dr. Vassie Loll Comments Medications reviewed with patient Verified pt's contact number Zackery Barefoot CMA  November 09, 2009 3:41 PM    Physical Exam  Additional Exam:  Gen. Pleasant, well-nourished, in no distress ENT - no lesions, no post nasal drip Neck: No JVD, no thyromegaly, no carotid bruits Lungs: no use of accessory muscles, no dullness to percussion, clear without rales, faint rt rhonchi  Cardiovascular: Rhythm  regular, heart sounds  normal, no murmurs or gallops, no peripheral edema Musculoskeletal: No deformities, no cyanosis or clubbing      Impression & Recommendations:  Problem # 1:  COPD (ICD-496) Unclear why dyspne ais worse - no reason for deterioration of lung function Likely related to other systemic problems. Have asked her to get appt with Dr Juanda Chance given recent episode of chest pain ct advair/ spiriva/ albuterol MDI - ok to lay off nebs Doubt pulm rehab will have added benefit.  Problem # 2:  HYPOXEMIA (ICD-799.02)  OK to not use portable O2. ct nocturnal O2  Orders: Est. Patient Level IV (16109)  Medications Added to Medication List This Visit: 1)  Proventil Hfa 108 (90 Base) Mcg/act Aers (Albuterol sulfate) .... Inhale 2 puffs every four hours as needed 2)  Colace 100 Mg Caps (  Docusate sodium) .... Take 1 tablet by mouth once a day 3)  Apap 325 Mg Tabs (Acetaminophen) .... Take 1 tablet by mouth every 4 hours as needed 4)  Actos 45 Mg Tabs (Pioglitazone hcl) .... Take 1 tablet by mouth once a day 5)  Cipro 500 Mg Tabs (Ciprofloxacin hcl) .... Take 1 tablet by mouth two times a day 6)  Metronidazole 500 Mg Tabs (Metronidazole) .... Take 1 tablet by mouth every 8 hours 7)  Ondansetron Hcl 4 Mg Tabs (Ondansetron hcl) .... Take 1 tablet by mouth every 6 hours as needed 8)  Miralax Powd (Polyethylene glycol 3350) .Marland KitchenMarland KitchenMarland Kitchen 17 grams daily  Other Orders: T- * Misc. Laboratory test [phone])  Patient Instructions: 1)  Copy sent to:Dr Brodie 2)  Please schedule a follow-up appointment in 3 months. 3)  Ambulatory satn 4)  Breathing test next visit 5)  Stay on advair & spiriva 6)  use rescue inhaler three times a day as needed  7)  Use nebs (albuterol) only as needed  Prescriptions: PROVENTIL HFA 108 (90 BASE) MCG/ACT AERS (ALBUTEROL SULFATE) Inhale 2 puffs every four hours as needed  #1 x 5   Entered and Authorized by:   [name] MD   Signed by:   [name] MD on  11/09/2009   Method used:   Electronically to        [id number] (retail)       [address] [place], [place]  [address]       Ph: [phone]       Fax: [phone]   RxID:   [id number]

## 2010-11-19 NOTE — Assessment & Plan Note (Signed)
Summary: 4 MONTH   Referring Provider:  Dr. Charlies Constable Primary Provider:  [name], MD  CC:  SOB with minimal exertion. The pt has seen Dr. Vassie Loll in pulmonary and was told her lungs are ok. She was hospitalized recently with a flare- up of diverticulitis. She denies edema. She has minimal dizziness. She had an episode of CP in the hospital and was put in ICU for 24 hours until she ruled out for an MI. Since d/c she has had one episode of CP that required two NTG with relief..  History of Present Illness: The patient is 70 years old and return for followup management of CAD. In 1992 she had an anterior MI treated with TPA and subsequent PTCA of the LAD. In 2009 she had a stroke treated with TPA. In July of 2002 was admitted with unstable angina and had stenting the circumflex artery. Her ejection fraction at that time was 35-40%. She was recently admitted on November 04, 2009 with abdominal pain felt to be diverticulitis. While in the hospital she had chest pain and was observed in the intensive care unit for one night.  She recently saw Dr. Vassie Loll for management of her COPD. She has been complaining of increased shortness of breath with rather minimal exertion. She also has had some occasional chest pain and she took 2 nitroglycerin on one occasion which was quite unusual for her.    Her past history is significant for hyperlipidemia and COPD.  her last permanent function tests were in October and she had a FEV1 of 0.85 which was 41% of predicted. She has been using oxygen at night.  Current Medications (verified): 1)  Spiriva Handihaler 18 Mcg Caps (Tiotropium Bromide Monohydrate) .... Two Puffs Once Daily 2)  Advair Diskus 250-50 Mcg/dose  Misc (Fluticasone-Salmeterol) .Marland Kitchen.. 1 Puff Q12 Hours 3)  Proair Hfa 108 (90 Base) Mcg/act Aers (Albuterol Sulfate) .... Inhale Two Puffs Every 4 Hours As Needed 4)  Metoprolol Succinate 25 Mg Xr24h-Tab (Metoprolol Succinate) .Marland Kitchen.. 1 By Mouth Once Daily 5)   Aspirin 325 Mg Tabs (Aspirin) .... Once Daily 6)  Plavix 75 Mg Tabs (Clopidogrel Bisulfate) .... Take 1 Tablet By Mouth Once A Day 7)  Simvastatin 40 Mg  Tabs (Simvastatin) .Marland Kitchen.. 1 By Mouth At Bedtime 8)  Fish Oil 1200 Mg Caps (Omega-3 Fatty Acids) .... Once Daily 9)  Tramadol Hcl 50 Mg Tabs (Tramadol Hcl) .... One Tab At Bedtime 10)  Alendronate Sodium 70 Mg  Tabs (Alendronate Sodium) .Marland Kitchen.. 1 By Mouth Qwk 11)  Calcium With Vitamin D, K, Magnesium and Zinc .... Take 1 Tablet By Mouth  Two Times A Day 12)  Vitamin D3 1000 Unit Caps (Cholecalciferol) .Marland Kitchen.. 1 Tab Two Times A Day 13)  Multivitamins  Tabs (Multiple Vitamin) .... Take 1/2  Tablet By Mouth Two Times A Day 14)  B-100 Complex  Tabs (Vitamins-Lipotropics) .... Take 1/2  Tablet By Mouth Once A Day 15)  Colace 100 Mg Caps (Docusate Sodium) .... Take 1 Tablet By Mouth Once A Day 16)  Study Drug-For Stoke .... Take 1 Tablet By Mouth Once Daily 17)  Nitroglycerin 0.4 Mg Subl (Nitroglycerin) .... As Needed 18)  L-Lysine Hcl 500 Mg Tabs (Lysine Hcl) .... Once Daily or Every Other Day 19)  Odorless Garlic 1250 Mg Tabs (Garlic) .... Once Daily 20)  Allrest .... Otc 1 Tab Once Daily 21)  Florastor 250 Mg Caps (Saccharomyces Boulardii) .Marland Kitchen.. 1 By Mouth Qd  Allergies (verified): 1)  ! Sulfa  2)  ! Codeine  Past History:  Past Medical History: Reviewed history from 07/25/2009 and no changes required. Right bimallleolar ankle fracture--12/2005 Right lateral ankle wound infection, probable methicillin sensitive staph aureua-- 01/2006 Coronary artery disease S/P MI 1990, S/P PTCA LAD 1999, S/P DES CFX 04/2009 Hyperlipidemia Osteopenia Tobacco abuse, hx of (D/Ced 10/20/1997) COPD      - Spirometry 07/25/09 FEV1 0.85(41%), FVC 2.09(72%), FEV1% 41 Hypoxemia      - Pulse ox 86% room air 07/25/09      - 2 liters with exertion and sleep  Cerebrovascular accident, hx of ,S/P TPA 04/20/2008, Dr Pearlean Brownie Diverticulitis, hx of, 11/09 Tubular Adenoma- high  grade dysplasia 2007  Review of Systems       ROS is negative except as outlined in HPI.   Vital Signs:  Patient profile:   70 year old female Height:      65 inches Weight:      164 pounds Pulse rate:   78 / minute Pulse rhythm:   regular BP sitting:   100 / 68  (left arm)  Vitals Entered By: Sherri Rad, RN, BSN (November 28, 2009 2:21 PM)  Physical Exam  Additional Exam:  Gen. Well-nourished, in no distress   Neck: No JVD, thyroid not enlarged, no carotid bruits Lungs: No tachypnea, clear without rales, rhonchi or wheezes,generalized decreased breath sounds Cardiovascular: Rhythm regular, PMI not displaced,  heart sounds  normal, no murmurs or gallops, no peripheral edema, pulses normal in all 4 extremities. Abdomen: BS normal, abdomen soft and non-tender without masses or organomegaly, no hepatosplenomegaly. MS: No deformities, no cyanosis or clubbing   Neuro:  No focal sns   Skin:  no lesions    Impression & Recommendations:  Problem # 1:  DYSPNEA (ICD-786.05)  She has had increased dyspnea with exertion over the past number of weeks. Her pulmonary physician thinks this is out of proportion to her pulmonary disease and is concerned about ischemia. She also has had an episode of chest pain requiring nitroglycerin and a few others that for which he did not take nitroglycerin.  I'm concerned her symptoms could be related to myocardial ischemia and we will arrange for her to have evaluation. I discussed both nuclear testing in a cardiac catheterization and we decided catheterization be the best approach.   Her updated medication list for this problem includes:    Metoprolol Succinate 25 Mg Xr24h-tab (Metoprolol succinate) .Marland Kitchen... 1 by mouth once daily    Aspirin 325 Mg Tabs (Aspirin) ..... Once daily  Orders: EKG w/ Interpretation (93000) Cardiac Catheterization (Cardiac Cath)  Problem # 2:  CORONARY ARTERY DISEASE (ICD-414.00)  She has documented coronary disease as  described above. Her last intervention was in July of 2010 with a drug-eluting stent to the circumflex artery. We plan further evaluation as outlined above. Her updated medication list for this problem includes:    Metoprolol Succinate 25 Mg Xr24h-tab (Metoprolol succinate) .Marland Kitchen... 1 by mouth once daily    Aspirin 325 Mg Tabs (Aspirin) ..... Once daily    Plavix 75 Mg Tabs (Clopidogrel bisulfate) .Marland Kitchen... Take 1 tablet by mouth once a day    Nitroglycerin 0.4 Mg Subl (Nitroglycerin) .Marland Kitchen... As needed  Orders: EKG w/ Interpretation (93000) Cardiac Catheterization (Cardiac Cath)  Problem # 3:  HYPERLIPIDEMIA-MIXED (ICD-272.4) This is controlled on current medications. Her updated medication list for this problem includes:    Simvastatin 40 Mg Tabs (Simvastatin) .Marland Kitchen... 1 by mouth at bedtime  Problem # 4:  COPD (ICD-496) She has significant COPD with reduced FEV1 and she requires O2 at night. We will plan to about her pulmonary pressures at catheterization. This is still an alternative explanation for her symptoms. The following medications were removed from the medication list:    Xopenex 0.63 Mg/65ml Nebu (Levalbuterol hcl) .Marland Kitchen... As needed Her updated medication list for this problem includes:    Spiriva Handihaler 18 Mcg Caps (Tiotropium bromide monohydrate) .Marland Kitchen..Marland Kitchen Two puffs once daily    Advair Diskus 250-50 Mcg/dose Misc (Fluticasone-salmeterol) .Marland Kitchen... 1 puff q12 hours    Proair Hfa 108 (90 Base) Mcg/act Aers (Albuterol sulfate) ..... Inhale two puffs every 4 hours as needed  Patient Instructions: 1)  Your physician recommends that you schedule a follow-up appointment in: 2- 3 weeks. 2)  Your physician recommends that you have lab work on 11/29/09: bmet/cbc/pt/ptt 3)  Your physician has requested that you have a cardiac catheterization.  Cardiac catheterization is used to diagnose and/or treat various heart conditions. Doctors may recommend this procedure for a number of different reasons. The most  common reason is to evaluate chest pain. Chest pain can be a symptom of coronary artery disease (CAD), and cardiac catheterization can show whether plaque is narrowing or blocking your heart's arteries. This procedure is also used to evaluate the valves, as well as measure the blood flow and oxygen levels in different parts of your heart.  For further information please visit [email or web address]  Please follow instruction sheet, as given.

## 2010-11-19 NOTE — Assessment & Plan Note (Signed)
Summary: acute follow-up//jrc   Visit Type:  Follow-up Copy to:  Dr. Charlies Constable Primary Provider/Referring Provider:  [name], MD  CC:  Pt here for sick follow up. Pt states finished antibiotic and prednisone. Pt c/o productive cough with pale yellow mucus.  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 She smoked for 40 years and quit in 1999.  Advair worked better than symbicort. Desaturates to 90% on ambulation 2/11  August 27, 2009  Acute visit for 'bronchitis >> improved with avelox & low dose steroids.  November 09, 2009 3:51 PM  Accompanied by daughter Gerri Spore ER RN) who wishes to optimise her 'quality of life'.. Adm [facility] for diverticulitis, now abdominal pain resolved. Episode of chest pain in the hospital without EKG changes & neg enzymes. Recently completed cardiac rehab without much benefit. Nocturnal O2 helps.  Pro- air expensive  January 16, 2010 12:23 PM  Discussed PFTs, Prednisone helped , gained 5-6 lbs, concerned about bone loss. Discussed exercise regimen. Compliant with O2 & helps  April 03, 2010 11:29 AM  SIck visit 6/11 chest cold - cefdinir helped ! (avelox too expensive) Completed course of steroids. Enquiring about pneumovax - she took hers when <65 (about 7 yrs ago) c/o minimal yellow phlegm  Current Medications (verified): 1)  Spiriva Handihaler 18 Mcg Caps (Tiotropium Bromide Monohydrate) .... Two Puffs Once Daily 2)  Advair Diskus 250-50 Mcg/dose  Misc (Fluticasone-Salmeterol) .Marland Kitchen.. 1 Puff Q12 Hours 3)  Proair Hfa 108 (90 Base) Mcg/act Aers (Albuterol Sulfate) .... Inhale Two Puffs Every 4 Hours As Needed 4)  Metoprolol Succinate 25 Mg Xr24h-Tab (Metoprolol Succinate) .Marland Kitchen.. 1 By Mouth Once Daily 5)  Aspirin 325 Mg Tabs (Aspirin) .... Once Daily 6)  Plavix 75 Mg Tabs (Clopidogrel Bisulfate) .... Take 1 Tablet By Mouth Once A Day 7)  Simvastatin 40 Mg  Tabs (Simvastatin) .Marland Kitchen.. 1 By  Mouth At Bedtime 8)  Fish Oil 1200 Mg Caps (Omega-3 Fatty Acids) .... Once Daily 9)  Tramadol Hcl 50 Mg Tabs (Tramadol Hcl) .... One Tab Each Morning 10)  Alendronate Sodium 70 Mg  Tabs (Alendronate Sodium) .Marland Kitchen.. 1 By Mouth Qwk 11)  Calcium With Vitamin D, K, Magnesium and Zinc .... Take 1 Tablet By Mouth  Two Times A Day 12)  Vitamin D3 2000 Unit Caps (Cholecalciferol) .... Take 1 Tablet By Mouth Two Times A Day 13)  Multivitamins  Tabs (Multiple Vitamin) .... Take 1/2  Tablet By Mouth Two Times A Day 14)  Doqlace 100 Mg .... Take 1 Tablet By Mouth Once A Day 15)  Study Drug-For Stoke .... Take 1 Tablet By Mouth Once Daily 16)  Nitroglycerin 0.4 Mg Subl (Nitroglycerin) .... As Needed 17)  L-Lysine Hcl 500 Mg Tabs (Lysine Hcl) .... Once Daily or Every Other Day 18)  Odorless Garlic 1250 Mg Tabs (Garlic) .... Once Daily 19)  Allrest .... Otc 1 Tab Once Daily 20)  Oxygen .... 2 Liters At Bedtime 21)  Celebrex 200 Mg Caps (Celecoxib) .Marland Kitchen.. 1 By Mouth Every Other Day 22)  Phillips Colon Health  Caps (Probiotic Product) .Marland Kitchen.. 1 By Mouth Daily----Hold 23)  Non-Aspirin 325 Mg Tabs (Acetaminophen) .... 2 By Mouth At Bedtime As Needed 24)  Urinary Tract Health 365-50 Mg Caps (Cranberry-Olive Leaf) .Marland Kitchen.. 1 Once Daily 25)  Promethazine-Codeine 6.25-10 Mg/50ml Syrp (Promethazine-Codeine) .... 2.5 To 5ml By Mouth At Bedtime As Needed 26)  Vicodin 5-500 Mg  Tabs (Hydrocodone-Acetaminophen) .... As Needed 27)  Xopenex 1.25 Mg/56ml Nebu (Levalbuterol Hcl) .... Use 1 Vial in Nebulizer Three To Four Times Daily As Needed 28)  Promethazine-Codeine 6.25-10 Mg/65ml  Syrp (Promethazine-Codeine) .... 2.5 By Mouth Every 4-6 Hours As Needed  Allergies (verified): 1)  ! Sulfa 2)  ! Codeine  Past History:  Past Medical History: Last updated: 07/25/2009 Right bimallleolar ankle fracture--12/2005 Right lateral ankle wound infection, probable methicillin sensitive staph aureua-- 01/2006 Coronary artery disease S/P MI 1990,  S/P PTCA LAD 1999, S/P DES CFX 04/2009 Hyperlipidemia Osteopenia Tobacco abuse, hx of (D/Ced 10/20/1997) COPD      - Spirometry 07/25/09 FEV1 0.85(41%), FVC 2.09(72%), FEV1% 41 Hypoxemia      - Pulse ox 86% room air 07/25/09      - 2 liters with exertion and sleep  Cerebrovascular accident, hx of ,S/P TPA 04/20/2008, Dr Pearlean Brownie Diverticulitis, hx of, 11/09 Tubular Adenoma- high grade dysplasia 2007  Social History: Last updated: 08/27/2009 Patient is a former smoker. -stopped in 1999 Alcohol Use - yes-occasion Daily Caffeine Use Illicit Drug Use - no Patient does not get regular exercise.  retired Veterinary surgeon  Review of Systems       The patient complains of dyspnea on exertion.  The patient denies anorexia, fever, weight loss, weight gain, vision loss, decreased hearing, hoarseness, chest pain, syncope, peripheral edema, prolonged cough, headaches, hemoptysis, abdominal pain, melena, hematochezia, severe indigestion/heartburn, hematuria, muscle weakness, suspicious skin lesions, difficulty walking, depression, unusual weight change, and abnormal bleeding.    Vital Signs:  Patient profile:   70 year old female Height:      65 inches Weight:      165 pounds BMI:     27.56 O2 Sat:      97 % on Room air Temp:     97.5 degrees F oral Pulse rate:   83 / minute BP sitting:   112 / 60  (right arm) Cuff size:   regular  Vitals Entered By: Zackery Barefoot CMA (April 03, 2010 11:16 AM)  O2 Flow:  Room air CC: Pt here for sick follow up. Pt states finished antibiotic and prednisone. Pt c/o productive cough with pale yellow mucus Comments Medications reviewed with patient Verified contact number and pharmacy with patient Zackery Barefoot CMA  April 03, 2010 11:16 AM    Physical Exam  Additional Exam:  Gen. Pleasant, well-nourished, in no distress ENT - no lesions, no post nasal drip Neck: No JVD, no thyromegaly, no carotid bruits Lungs: no use of accessory muscles, no dullness to  percussion, clear without rales, no rhonchi  Cardiovascular: Rhythm regular, heart sounds  normal, no murmurs or gallops, no peripheral edema Musculoskeletal: No deformities, no cyanosis or clubbing      Impression & Recommendations:  Problem # 1:  ACUTE BRONCHITIS (ICD-466.0)  -resolving More abx only if phlegm changes color or  increases in quantity.  No more steroids required since bspasm resolved. we discussed plan for future exacerbations use mucinex for now - OK to use codeine syrupp once non productive for relief. The following medications were removed from the medication list:    Cefdinir 300 Mg Caps (Cefdinir) .Marland Kitchen... 2 by mouth each am for 7 days. Her updated medication list for this problem includes:    Spiriva Handihaler 18 Mcg Caps (Tiotropium bromide monohydrate) .Marland Kitchen..Marland Kitchen Two puffs once daily    Advair Diskus 250-50 Mcg/dose Misc (Fluticasone-salmeterol) .Marland Kitchen... 1 puff q12 hours    Proair Hfa 108 (90 Base) Mcg/act Aers (  Albuterol sulfate) ..... Inhale two puffs every 4 hours as needed    Promethazine-codeine 6.25-10 Mg/39ml Syrp (Promethazine-codeine) .Marland Kitchen... 2.5 to 5ml by mouth at bedtime as needed    Xopenex 1.25 Mg/72ml Nebu (Levalbuterol hcl) ..... Use 1 vial in nebulizer three to four times daily as needed    Promethazine-codeine 6.25-10 Mg/9ml Syrp (Promethazine-codeine) .Marland Kitchen... 2.5 by mouth every 4-6 hours as needed  Orders: Est. Patient Level III (04540)  Problem # 2:  EMPHYSEMA (ICD-492.8)  ct current meds Can redose pneumovax in 1 month -once fully recovered or on next visit.  Orders: Est. Patient Level III (98119)  Medications Added to Medication List This Visit: 1)  Non-aspirin 325 Mg Tabs (Acetaminophen) .... 2 by mouth at bedtime as needed 2)  Vicodin 5-500 Mg Tabs (Hydrocodone-acetaminophen) .... As needed  Patient Instructions: 1)  Please schedule a follow-up appointment in 3 months with TP. 2)  We discussed rescue medications 3)  We discussed use of cough  syrup & plan for future exacerbations. 4)  Call us for pneumonia shot whenyou have recovered

## 2010-11-19 NOTE — Letter (Signed)
Summary: Cardiac Catheterization Instructions- [facility]  [facility], Main Office  [address]   [place], [place] [address]   Phone: [phone]  Fax: [phone]     11/28/2009 MRN: [id number]  [name] [address] [place], [place]  [address]  Dear Ms. Fukuda,   You are scheduled for a Cardiac Catheterization on Tuesday 12/04/09 with Dr.Brodie  Please arrive to the 1st floor of the [facility] at [facility] at 6:30 am (your procedure is scheduled to start @ 7:30am) on the day of your procedure. Please do not arrive before 6:30 a.m. Call the Heart and Vascular Center at [phone] if you are unable to make your appointmnet. The Code to get into the parking garage under the building is 0900. Take the elevators to the 1st floor. You must have someone to drive you home. Someone must be with you for the first 24 hours after you arrive home. Please wear clothes that are easy to get on and off and wear slip-on shoes. Do not eat or drink after midnight except water with your medications that morning. Bring all your medications and current insurance cards with you.  ___ DO NOT take these medications before your procedure: ________________________________________________________________  _x__ Make sure you take your aspirin.  _x_ You may take ALL of your medications with water that morning. ________________________________________________________________________________________________________________________________  ___ DO NOT take ANY medications before your procedure.  ___ Pre-med instructions:  ________________________________________________________________________________________________________________________________  The usual length of stay after your procedure is 2 to 3 hours. This can vary.  If you have any questions, please call the office at the number listed above.   Sherri Rad, RN, BSN

## 2010-11-19 NOTE — Assessment & Plan Note (Signed)
Summary: rov//mbw   Visit Type:  Follow-up Copy to:  Dr. Charlies Constable Primary Provider/Referring Provider:  [name], MD  CC:  Pt here for follow up. Pt states had heart cath 12-05-09 and cleared by Dr. Juanda Chance. Pt states breathing is "awful". Pt c/o increased S.O.B x 3 months. Pt concerned she may have lung cancer . Pt states she is having to use ProAir more frequently.  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 She smoked for 40 years and quit in 1999. She has been on advair, and this helps.  She used symbicort before, but felt that advair worked better. She has been on multiple rescue inhalers, and these help some. Uses husband's nebuliser with xopenex nebs . She had a cardiac stent in 7/10, but did not notice significant improvement in her breathing after this. Echo 7/10 >> septal & apical akinesis, EF 45-50% but subsequent cath, stent,  EF was lower. rpt cath 2/11 (Dr Juanda Chance) RVSP 38  August 27, 2009  Acute visit for 'bronchitis >> improved with avelox & low dose steroids.  November 09, 2009 3:51 PM  Accompanied by daughter Gerri Spore ER RN) who wishes to optimise her 'quality of life'.. Adm [facility] for diverticulitis, now abdominal pain resolved. Episode of chest pain in the hospital without EKG changes & neg enzymes. Recently completed cardiac rehab without much benefit. Will not use portable O2 at work >> but did not desaturate on exertion today. Nocturnal O2 helps.  Pro- air expensive  December 12, 2009 4:22 PM   had heart cath 12-05-09 and cleared by Dr. Juanda Chance. Pt states breathing is "awful". Pt c/o increased S.O.B x 3 months. She is concerned she may have lung cancer . Pt states she is having to use ProAir more frequently. Did not desaturate on exertion. CXR wnl    Current Medications (verified): 1)  Spiriva Handihaler 18 Mcg Caps (Tiotropium Bromide Monohydrate) .... Two Puffs Once Daily 2)  Advair Diskus  250-50 Mcg/dose  Misc (Fluticasone-Salmeterol) .Marland Kitchen.. 1 Puff Q12 Hours 3)  Proair Hfa 108 (90 Base) Mcg/act Aers (Albuterol Sulfate) .... Inhale Two Puffs Every 4 Hours As Needed 4)  Metoprolol Succinate 25 Mg Xr24h-Tab (Metoprolol Succinate) .Marland Kitchen.. 1 By Mouth Once Daily 5)  Aspirin 325 Mg Tabs (Aspirin) .... Once Daily 6)  Plavix 75 Mg Tabs (Clopidogrel Bisulfate) .... Take 1 Tablet By Mouth Once A Day 7)  Simvastatin 40 Mg  Tabs (Simvastatin) .Marland Kitchen.. 1 By Mouth At Bedtime 8)  Fish Oil 1200 Mg Caps (Omega-3 Fatty Acids) .... Once Daily 9)  Tramadol Hcl 50 Mg Tabs (Tramadol Hcl) .... One Tab At Bedtime 10)  Alendronate Sodium 70 Mg  Tabs (Alendronate Sodium) .Marland Kitchen.. 1 By Mouth Qwk 11)  Calcium With Vitamin D, K, Magnesium and Zinc .... Take 1 Tablet By Mouth  Two Times A Day 12)  Vitamin D3 2000 Unit Caps (Cholecalciferol) .... Take 1 Tablet By Mouth Two Times A Day 13)  Multivitamins  Tabs (Multiple Vitamin) .... Take 1/2  Tablet By Mouth Two Times A Day 14)  B-100 Complex  Tabs (Vitamins-Lipotropics) .... Take 1/2  Tablet By Mouth Once A Day 15)  Colace 100 Mg Caps (Docusate Sodium) .... Take 1 Tablet By Mouth Once A Day 16)  Study Drug-For Stoke .... Take 1 Tablet By Mouth Once Daily 17)  Nitroglycerin 0.4 Mg Subl (Nitroglycerin) .... As Needed 18)  L-Lysine Hcl 500 Mg Tabs (Lysine  Hcl) .... Once Daily or Every Other Day 19)  Odorless Garlic 1250 Mg Tabs (Garlic) .... Once Daily 20)  Allrest .... Otc 1 Tab Once Daily 21)  Florastor 250 Mg Caps (Saccharomyces Boulardii) .Marland Kitchen.. 1 By Mouth Qd 22)  Vicodin .... Take 1 Tab By Mouth At Bedtime 23)  Oxygen .... 2 Liters At Bedtime 24)  Celebrex 200 Mg Caps (Celecoxib) .... Take 1 Tablet By Mouth Once A Day  Allergies (verified): 1)  ! Sulfa 2)  ! Codeine  Past History:  Past Medical History: Last updated: 07/25/2009 Right bimallleolar ankle fracture--12/2005 Right lateral ankle wound infection, probable methicillin sensitive staph aureua--  01/2006 Coronary artery disease S/P MI 1990, S/P PTCA LAD 1999, S/P DES CFX 04/2009 Hyperlipidemia Osteopenia Tobacco abuse, hx of (D/Ced 10/20/1997) COPD      - Spirometry 07/25/09 FEV1 0.85(41%), FVC 2.09(72%), FEV1% 41 Hypoxemia      - Pulse ox 86% room air 07/25/09      - 2 liters with exertion and sleep  Cerebrovascular accident, hx of ,S/P TPA 04/20/2008, Dr Pearlean Brownie Diverticulitis, hx of, 11/09 Tubular Adenoma- high grade dysplasia 2007  Social History: Last updated: 08/27/2009 Patient is a former smoker. -stopped in 1999 Alcohol Use - yes-occasion Daily Caffeine Use Illicit Drug Use - no Patient does not get regular exercise.  retired Veterinary surgeon  Review of Systems       The patient complains of dyspnea on exertion.  The patient denies anorexia, fever, weight loss, weight gain, vision loss, decreased hearing, hoarseness, chest pain, syncope, peripheral edema, prolonged cough, headaches, hemoptysis, abdominal pain, melena, hematochezia, severe indigestion/heartburn, hematuria, muscle weakness, difficulty walking, depression, unusual weight change, and abnormal bleeding.    Vital Signs:  Patient profile:   70 year old female Height:      65 inches Weight:      169 pounds O2 Sat:      96 % on Room air Temp:     97.5 degrees F oral Pulse rate:   71 / minute BP sitting:   110 / 60  (right arm) Cuff size:   regular  Vitals Entered By: Zackery Barefoot CMA (December 12, 2009 4:01 PM)  O2 Flow:  Room air  Serial Vital Signs/Assessments:  Comments: Ambulatory Pulse Oximetry  Resting; HR__76___    02 Sat_93%RA____  Lap1 (185 feet)   HR__87___   02 Sat__94%RA___ Lap2 (185 feet)   HR__90___   02 Sat__91%RA___    Lap3 (185 feet)   HR__93___   02 Sat__91%RA___  _x__Test Completed without Difficulty ___Test Stopped due to:   Zackery Barefoot CMA  December 12, 2009 5:24 PM  By: Zackery Barefoot CMA   CC: Pt here for follow up. Pt states had heart cath 12-05-09 and cleared by  Dr. Juanda Chance. Pt states breathing is "awful". Pt c/o increased S.O.B x 3 months. Pt concerned she may have lung cancer . Pt states she is having to use ProAir more frequently Comments Medications reviewed with patient Verified contact number and pharmacy with patient Zackery Barefoot CMA  December 12, 2009 4:02 PM    Physical Exam  Additional Exam:  Gen. Pleasant, well-nourished, in no distress ENT - no lesions, no post nasal drip Neck: No JVD, no thyromegaly, no carotid bruits Lungs: no use of accessory muscles, no dullness to percussion, clear without rales, faint rt rhonchi  Cardiovascular: Rhythm regular, heart sounds  normal, no murmurs or gallops, no peripheral edema Musculoskeletal: No deformities, no cyanosis or clubbing  CXR  Procedure date:  12/12/2009  Findings:      Comparison: 04/24/2009   Findings: Heart size is normal.  There is calcification of the thoracic aorta.  The lungs are clear.  No effusions.  No bony abnormalities.   IMPRESSION: No active disease.  Impression & Recommendations:  Problem # 1:  DYSPNEA (ICD-786.05) -combination of cardiopulmonary disease & deconditioning. No risk factors for PE . Orders: Pulmonary Referral (Pulmonary) T-2 View CXR (71020TC) Est. Patient Level IV (91478) Prescription Created Electronically [phone])  Problem # 2:  CHRONIC OBSTRUCTIVE PULMONARY DISEASE, ACUTE EXACERBATION (ICD-491.21) Short course of prednisone Spiriva in am Nebuliser - albuterol x 1 in am take rescue inhaler as needed  OK to stay off O2 daytime. Orders: Pulmonary Referral (Pulmonary) T-2 View CXR (71020TC) Est. Patient Level IV (13086) Prescription Created Electronically [id number])  Medications Added to Medication List This Visit: 1)  Vitamin D3 2000 Unit Caps (Cholecalciferol) .... Take 1 tablet by mouth two times a day 2)  Vicodin  .... Take 1 tab by mouth at bedtime 3)  Oxygen  .... 2 liters at bedtime 4)  Celebrex 200 Mg Caps (Celecoxib)  .... Take 1 tablet by mouth once a day 5)  Prednisone 10 Mg Tabs (Prednisone) .... 2 tabs once daily x  1week then 1 tab once daily x 1 week, then stop  Patient Instructions: 1)  Copy sent to: Dr Juanda Chance 2)  Please schedule a follow-up appointment in 2 weeks. 3)  Short course of prednisone 4)  Spiriva in am 5)  Nebuliser - albuterol x 1 in am 6)  take rescue inhaler as needed  7)  You have been asked to make an appointment for a Pulmonary Function Test (Breathing Test) prior to or at the time of your next visit. Use medications as usual unless otherwise instructed. 8)  A chest x-ray has been recommended.  Your imaging study may require preauthorization.  Prescriptions: PREDNISONE 10 MG TABS (PREDNISONE) 2 tabs once daily x  1week then 1 tab once daily x 1 week, then stop  #21 x 0   Entered and Authorized by:   Comer Locket. Vassie Loll MD   Signed by:   [name] MD on 12/12/2009   Method used:   Electronically to        [id number] (retail)       [address] [place], [place]  [address]       Ph: [phone]       Fax: [phone]   RxID:   [id number]    Orders Added: 1)  Pulmonary Referral [Pulmonary] 2)  T-2 View CXR [71020TC] 3)  Est. Patient Level IV [33295] 4)  Prescription Created Electronically [phone]      Appended Document: rov//mbw let her know PFTs reviewed >> FEV1 48%  - unchanged compared to prior  Appended Document: rov//mbw Pt informed. Pt states she has never had a breathing test done before and would like to know what it was compared to. Please advise. jwr

## 2010-11-19 NOTE — Progress Notes (Signed)
Summary: Referral Request  Phone Note Call from Patient Call back at Home Phone [phone]   Caller: Patient Summary of Call: Message left on Triage VM: patient was in the hospital and was told she needs to see an Ear doctor. Patient seen Dr.Jacobs in the past but he is no longer in pratice. Patient would like to see Dr.Shoemaker, tried to schedule herself but was told to contact her primary   Shonna Chock CMA  May 24, 2010 9:34 AM   Follow-up for Phone Call        BPV , see hosptal records Follow-up by: [name] MD,  May 24, 2010 10:39 AM  Additional Follow-up for Phone Call Additional follow up Details #1::        Faxed information to [facility]. Additional Follow-up by: Harold Barban,  May 24, 2010 11:06 AM  New Problems: BENIGN POSITIONAL VERTIGO (ICD-386.11)   New Problems: BENIGN POSITIONAL VERTIGO (ICD-386.11)

## 2010-11-19 NOTE — Progress Notes (Signed)
Summary: Triage  Phone Note Call from Patient Call back at Home Phone [phone]   Caller: Patient Call For: Dr Jarold Motto Reason for Call: Talk to Nurse Details for Reason: Triage Summary of Call: Pt thinks she is having a diverticulitis flare. States severe abd pain x7 days, no fever, no blood in stool, no diarrhea. Initial call taken by: Dwan Bolt,  September 16, 2010 12:45 PM  Follow-up for Phone Call        Patient c/o cramping abdominal pain for the last several days and wants an appointment with Dr Jarold Motto.  Patient will come in tomorrow at 8:45 Follow-up by: Darcey Nora RN, CGRN,  September 16, 2010 1:38 PM

## 2010-11-19 NOTE — Assessment & Plan Note (Signed)
Summary: 1 MO F/U..AM.   History of Present Illness Visit Type: Follow-up Visit Primary GI MD: Sheryn Bison MD FACP FAGA Primary Provider: [name], MD Chief Complaint: follow up from divertic flare, pt states she feels a rumbling-"like something may be beginning".  Pt states she has some constipation.  She has a bowel movement daily or every other day.  Stools are hard.  She is using a stool softener.  Pt states she is having a hard time getting back to normal after flare up and still does not have any energy. History of Present Illness:   She is much improved but continues to complain of chronic fatigue. Since she was last seen she has had followup coronary artery catheterization and has a stent in place and is on Plavix and aspirin. She continues with mild abdominal gas, bloating and bowel irregularity without rectal bleeding. Review of her extensive records today again was accomplished and shows that she had a colonoscopy with diverticulosis and removal of a adenomatous polyp in December 2007. She denies rectal bleeding at this time. Previous endoscopy was unremarkable. She is on multiple medications that are listed and reviewed her record today.   GI Review of Systems    Reports abdominal pain.      Denies acid reflux, belching, bloating, chest pain, dysphagia with liquids, dysphagia with solids, heartburn, loss of appetite, nausea, vomiting, vomiting blood, weight loss, and  weight gain.      Reports constipation.     Denies anal fissure, black tarry stools, change in bowel habit, diarrhea, diverticulosis, fecal incontinence, heme positive stool, hemorrhoids, irritable bowel syndrome, jaundice, light color stool, liver problems, rectal bleeding, and  rectal pain.    Current Medications (verified): 1)  Spiriva Handihaler 18 Mcg Caps (Tiotropium Bromide Monohydrate) .... Two Puffs Once Daily 2)  Advair Diskus 250-50 Mcg/dose  Misc (Fluticasone-Salmeterol) .Marland Kitchen.. 1 Puff Q12 Hours 3)   Proair Hfa 108 (90 Base) Mcg/act Aers (Albuterol Sulfate) .... Inhale Two Puffs Every 4 Hours As Needed 4)  Metoprolol Succinate 25 Mg Xr24h-Tab (Metoprolol Succinate) .Marland Kitchen.. 1 By Mouth Once Daily 5)  Aspirin 325 Mg Tabs (Aspirin) .... Once Daily 6)  Plavix 75 Mg Tabs (Clopidogrel Bisulfate) .... Take 1 Tablet By Mouth Once A Day 7)  Simvastatin 40 Mg  Tabs (Simvastatin) .Marland Kitchen.. 1 By Mouth At Bedtime 8)  Fish Oil 1200 Mg Caps (Omega-3 Fatty Acids) .... Once Daily 9)  Tramadol Hcl 50 Mg Tabs (Tramadol Hcl) .... One Tab At Bedtime 10)  Alendronate Sodium 70 Mg  Tabs (Alendronate Sodium) .Marland Kitchen.. 1 By Mouth Qwk 11)  Calcium With Vitamin D, K, Magnesium and Zinc .... Take 1 Tablet By Mouth  Two Times A Day 12)  Vitamin D3 2000 Unit Caps (Cholecalciferol) .... Take 1 Tablet By Mouth Two Times A Day 13)  Multivitamins  Tabs (Multiple Vitamin) .... Take 1/2  Tablet By Mouth Two Times A Day 14)  B-100 Complex  Tabs (Vitamins-Lipotropics) .... Take 1/2  Tablet By Mouth Once A Day 15)  Colace 100 Mg Caps (Docusate Sodium) .... Take 1 Tablet By Mouth Once A Day 16)  Study Drug-For Stoke .... Take 1 Tablet By Mouth Once Daily 17)  Nitroglycerin 0.4 Mg Subl (Nitroglycerin) .... As Needed 18)  L-Lysine Hcl 500 Mg Tabs (Lysine Hcl) .... Once Daily or Every Other Day 19)  Odorless Garlic 1250 Mg Tabs (Garlic) .... Once Daily 20)  Allrest .... Otc 1 Tab Once Daily 21)  Florastor 250 Mg Caps (Saccharomyces  Boulardii) .Marland Kitchen.. 1 By Mouth Qd 22)  Hydrocodone-Acetaminophen 5-500 Mg Tabs (Hydrocodone-Acetaminophen) .... Take 1 Tab By Mouth At Bedtime 23)  Oxygen .... 2 Liters At Bedtime 24)  Celebrex 200 Mg Caps (Celecoxib) .... Take 1 Tablet By Mouth Once A Day 25)  Prednisone 10 Mg Tabs (Prednisone) .... 2 Tabs Once Daily X  1week Then 1 Tab Once Daily X 1 Week, Then Stop 26)  Voltaren 1 % Gel (Diclofenac Sodium) .... Apply To Shoulder Daily  Allergies (verified): 1)  ! Sulfa 2)  ! Codeine  Past History:  Past  medical, surgical, family and social histories (including risk factors) reviewed for relevance to current acute and chronic problems.  Past Medical History: Reviewed history from 07/25/2009 and no changes required. Right bimallleolar ankle fracture--12/2005 Right lateral ankle wound infection, probable methicillin sensitive staph aureua-- 01/2006 Coronary artery disease S/P MI 1990, S/P PTCA LAD 1999, S/P DES CFX 04/2009 Hyperlipidemia Osteopenia Tobacco abuse, hx of (D/Ced 10/20/1997) COPD      - Spirometry 07/25/09 FEV1 0.85(41%), FVC 2.09(72%), FEV1% 41 Hypoxemia      - Pulse ox 86% room air 07/25/09      - 2 liters with exertion and sleep  Cerebrovascular accident, hx of ,S/P TPA 04/20/2008, Dr Pearlean Brownie Diverticulitis, hx of, 11/09 Tubular Adenoma- high grade dysplasia 2007  Past Surgical History: Reviewed history from 09/22/2008 and no changes required. ORIF--Right ankle--01/2006 Hysterectomy--TAH/BSO Percutaneous transluminal coronary angioplasty Bilateral Breast Augmentation Endo 08/24/08 : duodenal polyp, Candida esophagitis,4 mm non bleeding antral ulcer, Dr Marina Goodell appendectomy vein stripping x 2 legs "martial megeti" (bladder)  Family History: Reviewed history from 09/22/2008 and no changes required. Family History of Breast Cancer: Sister x 2 No FH of Colon Cancer: Family History of Pancreatic Cancer: 2nd cousin Family History of Colon Polyps: Sister Family History of Diabetes: Paternal Grandmother, Maternal Uncle Family History of Heart Disease: Mother, Father, Maternal Grandfather, Paternal Grandfather Family History of Uterine Cancer: Paternal Grandmother  Social History: Reviewed history from 08/27/2009 and no changes required. Patient is a former smoker. -stopped in 1999 Alcohol Use - yes-occasion Daily Caffeine Use Illicit Drug Use - no Patient does not get regular exercise.  retired Veterinary surgeon  Review of Systems       The patient complains of arthritis/joint  pain.  The patient denies allergy/sinus, anemia, anxiety-new, back pain, blood in urine, breast changes/lumps, confusion, cough, coughing up blood, depression-new, fainting, fatigue, fever, headaches-new, hearing problems, heart murmur, heart rhythm changes, itching, menstrual pain, muscle pains/cramps, night sweats, nosebleeds, pregnancy symptoms, shortness of breath, skin rash, sleeping problems, sore throat, swelling of feet/legs, swollen lymph glands, thirst - excessive, urination - excessive, urination changes/pain, urine leakage, vision changes, and voice change.    Vital Signs:  Patient profile:   70 year old female Height:      65 inches Weight:      166 pounds BMI:     27.72 Pulse rate:   76 / minute Pulse rhythm:   regular BP sitting:   100 / 60  (right arm) Cuff size:   regular  Vitals Entered By: Francee Piccolo CMA Duncan Dull) (December 14, 2009 11:54 AM)  Physical Exam  General:  Well developed, well nourished, no acute distress.healthy appearing.   Head:  Normocephalic and atraumatic. Eyes:  PERRLA, no icterus.exam deferred to patient's ophthalmologist.   Abdomen:  Soft, nontender and nondistended. No masses, hepatosplenomegaly or hernias noted. Normal bowel sounds. Psych:  Alert and cooperative. Normal mood and affect.   Impression & Recommendations:  Problem # 1:  DIVERTICULITIS-COLON (ICD-562.11) Assessment Improved Add [facility] preparation twice a day with high fiber diet liberal p.o. fluids.  Problem # 2:  PERSONAL HX COLONIC POLYPS (ICD-V12.72) Assessment: Unchanged Followup colonoscopy this summer with patient all Plavix for 5 days. This will need to be approved by cardiology.  Problem # 3:  ABDOMINAL PAIN-GENERALIZED (ICD-789.07) Assessment: Improved  Problem # 4:  CHRONIC OBSTRUCTIVE PULMONARY DISEASE, ACUTE EXACERBATION (ICD-491.21) Assessment: Improved pulmonary evaluation per Dr. Neomia Dear in process.  Patient Instructions: 1)  Copy sent to :  Dr. [name] and Dr.Avaa in pulmonary medicine and Dr. Charlies Constable in cardiology. 2)  Please continue current medications.  3)  Diet should be high in fiber ( fruits, vegetables, whole grains) but low in residue. Drink at least eight (8) glasses of water a day.  4)  [facility] preparation twice a day 5)  Colonoscopy this summer off Plavix for 5 days. 6)  The medication list was reviewed and reconciled.  All changed / newly prescribed medications were explained.  A complete medication list was provided to the patient / caregiver.

## 2010-11-21 NOTE — Progress Notes (Signed)
Summary: samples left upfront for pick up  Phone Note Call from Patient Call back at Home Phone [phone]   Caller: Patient Summary of Call: Patient phoned she needs enough samples of the PROAIR, SPRIVIA, AND ADVIR 250to last until her 11/26/2010 appointment. patient can be reached at [phone] please call to advise patient if we have these samples for pick up.  Initial call taken by: Vedia Coffer,  November 11, 2010 1:18 PM  Follow-up for Phone Call        Called and spoke with pt and informed her that we do not have any proair in or any other type of rescue inhaler in but we did leave her other samples she requested up front for her to pick up. Pt verbalized understanding Carver Fila  November 11, 2010 4:28 PM

## 2010-11-21 NOTE — Progress Notes (Signed)
Summary: Revonda Standard with Anesthesia-[id number]  Phone Note From Other Clinic   Caller: Allison/with [facility] Call For: dr Vassie Loll Summary of Call: Pt is scheduled for shoulder replacement on monday note in chart said that he wanted to see her prior to surgery but the patient stated that this note was regarding a different surgery for Feb or [place]. Revonda Standard states that by her exam the patient was doing well and patient stated that she was doing well. Revonda Standard can be reached [phone] and would like a return call to advise if the patient needs to be seen prior to this surgery.  Initial call taken by: Vedia Coffer,  October 24, 2010 4:44 PM  Follow-up for Phone Call        Dr. Vassie Loll, did you need to see this pt prior to her shoulder surgery? Last note from Nov looks like you said it was okay for surgery.  Just wanted to make sure, pls advise, thanks! Follow-up by: Vernie Murders,  October 24, 2010 5:34 PM  Additional Follow-up for Phone Call Additional follow up Details #1::        Last OV from 08/30/10 faxed to [facility] @ [phone]. Also spoke with Revonda Standard and advised her that we received a fax from [facility] and per RA to fax a copy of above. I advised RA did not advise pt needed to come in. Dr. Vassie Loll please advise otherwise. Zackery Barefoot CMA  October 25, 2010 10:46 AM     Additional Follow-up for Phone Call Additional follow up Details #2::    If general anesthesia, she will need OV with first available MD - since last visit was > 2 months ago. If local or regional anesthesia, Ok to proceed. the request did not make this clear. Follow-up by: [name] MD,  October 25, 2010 1:36 PM  Additional Follow-up for Phone Call Additional follow up Details #3:: Details for Additional Follow-up Action Taken: LMTCBx1 with allison to ask the above. Carron Curie CMA  October 25, 2010 2:00 PM  Spoke with Revonda Standard and made aware of the above recs per RA.  She states that pt  needs general, so she will let the pt know that she needs to call for appt.  Nothing further needed. Additional Follow-up by: Vernie Murders,  October 25, 2010 3:07 PM

## 2010-11-21 NOTE — Letter (Signed)
Summary: Surgical clearance/[facility]  Surgical clearance/[facility]   Imported By: Lester Pastos 11/01/2010 07:46:52  _____________________________________________________________________  External Attachment:    Type:   Image     Comment:   External Document

## 2010-11-21 NOTE — Assessment & Plan Note (Signed)
Summary: surgical clearance/mhh   Visit Type:  Follow-up Copy to:  Dr. Charlies Constable Primary Provider/Referring Provider:  [name], MD  CC:  Surgical Clearance.  History of Present Illness: 69/F, ex smoker, realtor with Gold stg III COPD FEV1 1.0 L (48%), on O2 since 10/10 for FU. She smoked for 40 years and quit in 1999.  Advair worked better than symbicort. Desaturates to 90% on ambulation 2/11  August 27, 2009  -Acute visit for 'bronchitis >> improved with avelox & low dose steroids.  November 09, 2009 3:51 PM  -Adm [facility] for diverticulitis  January 16, 2010 12:23 PM   Discussed PFTs, Prednisone helped , gained 5-6 lbs, concerned about bone loss. Discussed exercise regimen. Compliant with O2 & helps  April 03, 2010 11:29 AM  SIck visit 6/11 chest cold - cefdinir helped ! (avelox too expensive) Completed course of steroids.  c/o minimal yellow phlegm  August 30, 2010 4:10 PM  - Pre-op clearance Planning 'tummy tuck under [place] by Dr Odis Luster early next yr after she loses 25 lbs. No flares in last 4 months, compliant with meds, use O2 at night, resistant to joining pulm rehab but is enthusiastic about joining an exercise regimen Enquiring about pneumovax - she took hers when <65 (about 7 yrs ago)   Current Medications (verified): 1)  Spiriva Handihaler 18 Mcg Caps (Tiotropium Bromide Monohydrate) .... Two Puffs Once Daily 2)  Advair Diskus 250-50 Mcg/dose  Misc (Fluticasone-Salmeterol) .Marland Kitchen.. 1 Puff Q12 Hours 3)  Proair Hfa 108 (90 Base) Mcg/act Aers (Albuterol Sulfate) .... Inhale Two Puffs Every 4 Hours As Needed 4)  Metoprolol Succinate 25 Mg Xr24h-Tab (Metoprolol Succinate) .Marland Kitchen.. 1 By Mouth Once Daily 5)  Aspirin 81 Mg Tbec (Aspirin) .... Take One Tablet By Mouth Daily 6)  Simvastatin 40 Mg  Tabs (Simvastatin) .Marland Kitchen.. 1 By Mouth At Bedtime 7)  Fish Oil 1200 Mg Caps (Omega-3 Fatty Acids) .... Once Daily 8)  Tramadol Hcl 50 Mg Tabs (Tramadol Hcl) .... Take 1 Tab By Mouth At  Bedtime 9)  Alendronate Sodium 70 Mg  Tabs (Alendronate Sodium) .Marland Kitchen.. 1 By Mouth Qwk 10)  Calcium-Vitamin D 500-200 Mg-Unit Tabs (Calcium-Vitamin D) .... Take 1 Tablet By Mouth Two Times A Day 11)  Vitamin D3 2000 Unit Caps (Cholecalciferol) .... Take 1 Tablet By Mouth Two Times A Day 12)  Multivitamins  Tabs (Multiple Vitamin) .... Take 1/2  Tablet By Mouth Two Times A Day 13)  Doqlace 100 Mg .... Take 1 Tablet By Mouth Once A Day 14)  Study Drug-For Stoke .... Take 1 Tablet By Mouth Once Daily 15)  Nitroglycerin 0.4 Mg Subl (Nitroglycerin) .... As Needed 16)  L-Lysine Hcl 500 Mg Tabs (Lysine Hcl) .... Once Daily or Every Other Day 17)  Odorless Garlic 1250 Mg Tabs (Garlic) .... Take 1 Tablet By Mouth Once A Day 18)  Allclear .... Otc 1 Tab Once Daily 19)  Oxygen .... 2 Liters At Bedtime 20)  Celebrex 200 Mg Caps (Celecoxib) .... Take 1 Tablet By Mouth Once A Day 21)  Urinary Tract Health 365-50 Mg Caps (Cranberry-Olive Leaf) .... Take 1 Tablet By Mouth Once A Day 22)  Vicodin 5-500 Mg Tabs (Hydrocodone-Acetaminophen) .... Take 1/2 Tab By Mouth At Bedtime 23)  Prednisone 10 Mg Tabs (Prednisone) .... As Needed 24)  Zolpidem Tartrate 5 Mg Tabs (Zolpidem Tartrate) .... Take One Tab By Mouth At Bedtime As Needed For Sleep.  Allergies (verified): 1)  ! Sulfa 2)  ! Codeine  Past History:  Past Medical History: Last updated: 07/25/2009 Right bimallleolar ankle fracture--12/2005 Right lateral ankle wound infection, probable methicillin sensitive staph aureua-- 01/2006 Coronary artery disease S/P MI 1990, S/P PTCA LAD 1999, S/P DES CFX 04/2009 Hyperlipidemia Osteopenia Tobacco abuse, hx of (D/Ced 10/20/1997) COPD      - Spirometry 07/25/09 FEV1 0.85(41%), FVC 2.09(72%), FEV1% 41 Hypoxemia      - Pulse ox 86% room air 07/25/09      - 2 liters with exertion and sleep  Cerebrovascular accident, hx of ,S/P TPA 04/20/2008, Dr Pearlean Brownie Diverticulitis, hx of, 11/09 Tubular Adenoma- high grade  dysplasia 2007  Social History: Last updated: 08/27/2009 Patient is a former smoker. -stopped in 1999 Alcohol Use - yes-occasion Daily Caffeine Use Illicit Drug Use - no Patient does not get regular exercise.  retired Veterinary surgeon  Review of Systems       The patient complains of dyspnea on exertion.  The patient denies anorexia, fever, weight loss, weight gain, vision loss, decreased hearing, hoarseness, chest pain, syncope, peripheral edema, prolonged cough, headaches, hemoptysis, abdominal pain, melena, hematochezia, severe indigestion/heartburn, hematuria, muscle weakness, suspicious skin lesions, difficulty walking, depression, unusual weight change, abnormal bleeding, enlarged lymph nodes, and angioedema.    Vital Signs:  Patient profile:   70 year old female Height:      65 inches Weight:      174 pounds O2 Sat:      93 % on Room air Temp:     97.9 degrees F oral Pulse rate:   85 / minute BP sitting:   118 / 70  (right arm) Cuff size:   large  Vitals Entered By: Zackery Barefoot CMA (August 30, 2010 4:04 PM)  O2 Flow:  Room air  Physical Exam  Additional Exam:  wt 174 August 30, 2010  Gen. Pleasant, well-nourished, in no distress ENT - no lesions, no post nasal drip Neck: No JVD, no thyromegaly, no carotid bruits Lungs: no use of accessory muscles, no dullness to percussion, clear without rales, no rhonchi  Cardiovascular: Rhythm regular, heart sounds  normal, no murmurs or gallops, no peripheral edema Musculoskeletal: No deformities, no cyanosis or clubbing      Impression & Recommendations:  Problem # 1:  COPD (ICD-496) pre-op clearance At mild - moderate risk for post op pulmonary comlications ct advair two times a day & spiriva once daily  use O2 during sleep Incentive spirometry & DVT prophylaxis post-op Pneumovax See me week prior to surgery   Medications Added to Medication List This Visit: 1)  Tramadol Hcl 50 Mg Tabs (Tramadol hcl) .... Take 1 tab  by mouth at bedtime 2)  Calcium-vitamin D 500-200 Mg-unit Tabs (Calcium-vitamin d) .... Take 1 tablet by mouth two times a day 3)  Odorless Garlic 1250 Mg Tabs (Garlic) .... Take 1 tablet by mouth once a day 4)  Celebrex 200 Mg Caps (Celecoxib) .... Take 1 tablet by mouth once a day 5)  Urinary Tract Health 365-50 Mg Caps (Cranberry-olive leaf) .... Take 1 tablet by mouth once a day 6)  Vicodin 5-500 Mg Tabs (Hydrocodone-acetaminophen) .... Take 1/2 tab by mouth at bedtime  Other Orders: Est. Patient Level IV (02725)  Patient Instructions: 1)  Copy sent to: Dr Etter Sjogren 2)  Please schedule a follow-up appointment in 4 months. 3)  OK to undergo surgery 4)  use incentive spirometer right after surgery 5)  Try to see me a few days prior to surgery 6)  Pneumovax today  Immunization History:  Influenza Immunization History:    Influenza:  historical (08/16/2010)   Appended Document: surgical clearance/mhh    Clinical Lists Changes  Orders: Added new Service order of Pneumococcal Vaccine (29562) - Signed Added new Service order of Admin 1st Vaccine (13086) - Signed Observations: Added new observation of PNEUMOVAXLOT: 1137AA (08/30/2010 17:10) Added new observation of PNEUMOVAXEXP: 02/13/2012 (08/30/2010 17:10) Added new observation of PNEUMOVAXBY: Michel Bickers CMA (08/30/2010 17:10) Added new observation of PNEUMOVAXRTE: IM (08/30/2010 17:10) Added new observation of PNEUMOVAXDOS: 0.5 ml (08/30/2010 17:10) Added new observation of PNEUMOVAXMFR: Merck (08/30/2010 17:10) Added new observation of PNEUMOVAXSIT: left deltoid (08/30/2010 17:10) Added new observation of PNEUMOVAX: Pneumovax (Medicare) (08/30/2010 17:10)       Immunizations Administered:  Pneumonia Vaccine:    Vaccine Type: Pneumovax (Medicare)    Site: left deltoid    Mfr: Merck    Dose: 0.5 ml    Route: IM    Given by: Michel Bickers CMA    Exp. Date: 02/13/2012    Lot #: [id number]  Appended Document:  surgical clearance/mhh pl fax this note to [facility], Imelda Pillow  [phone]  Appended Document: surgical clearance/mhh faxed

## 2010-11-21 NOTE — Progress Notes (Signed)
Summary: speak to nurse or Anntoinette Haefele TODAY  before closing  Phone Note From Other Clinic   Caller: traci w/ dr supple Call For: [name] Summary of Call: requests to speak to nurse or dr Vassie Loll asap today since pt's shoulder surgery has been "put on hold". this is causing a lot of problems. pt in pain. caller wants to make sure that this was cancelled per dr Matti Killingsworth's advise. call traci's cell # [phone]  Initial call taken by: Tivis Ringer, CNA,  October 25, 2010 5:10 PM  Follow-up for Phone Call        I spoke with Traci, [place] for Dr. Rennis Chris and she wanted to verify that the pt needed an OV before surgical clearance could be given. I advised per phone note that if pt was having general anesthesia then she would need an ov for clearance. Traci said it is going to be general anesthesia. She is aware of pt appt to see MW next week. Carron Curie CMA  October 25, 2010 5:21 PM

## 2010-11-21 NOTE — Progress Notes (Signed)
Summary: appointment  Phone Note Call from Patient   Caller: Patient Call For: Dr. Vassie Loll Summary of Call: Patient called she is very upset her surgery was canceled for Monday because of Dr. Vassie Loll she states that she doesnt care where she has to go to see him. She needs her surgery back on the books because she is in horrible pain. She does not want to see the doctor that she is scheduled with on Wednesday she wants to see Dr. Vassie Loll. She stated that now she will have to redo all of her labs and everything. She can be reached at [phone]. She is very upset and wants a call back today. Initial call taken by: Vedia Coffer,  October 25, 2010 5:04 PM  Follow-up for Phone Call        Spoke with pt and she is refusing to see MR and wished to resched.  I sched her with MW for 10/31/10 at 11:45 am and advised needs to bring all meds with her in hand to appt. Pt states "you already have all my meds"- I advised that she needs to bring all of her meds so that we can verify what she is taking.  She verbalized understanding. Follow-up by: Vernie Murders,  October 25, 2010 5:19 PM

## 2010-11-26 ENCOUNTER — Ambulatory Visit: Payer: Self-pay | Admitting: Pulmonary Disease

## 2010-12-02 ENCOUNTER — Encounter: Payer: Self-pay | Admitting: Cardiovascular Disease

## 2010-12-02 ENCOUNTER — Encounter: Payer: Self-pay | Admitting: Pulmonary Disease

## 2010-12-02 ENCOUNTER — Encounter (HOSPITAL_COMMUNITY)
Admission: RE | Admit: 2010-12-02 | Discharge: 2010-12-02 | Disposition: A | Discharge status: Home / Self Care | Payer: Medicare Other | Source: Ambulatory Visit | Attending: Orthopedic Surgery | Admitting: Orthopedic Surgery

## 2010-12-02 ENCOUNTER — Ambulatory Visit (INDEPENDENT_AMBULATORY_CARE_PROVIDER_SITE_OTHER): Payer: Medicare Other | Admitting: Pulmonary Disease

## 2010-12-02 ENCOUNTER — Encounter (INDEPENDENT_AMBULATORY_CARE_PROVIDER_SITE_OTHER): Payer: Medicare Other | Admitting: Cardiovascular Disease

## 2010-12-02 DIAGNOSIS — J449 Chronic obstructive pulmonary disease, unspecified: Secondary | ICD-10-CM

## 2010-12-02 DIAGNOSIS — Z01818 Encounter for other preprocedural examination: Secondary | ICD-10-CM

## 2010-12-02 DIAGNOSIS — I10 Essential (primary) hypertension: Secondary | ICD-10-CM

## 2010-12-02 DIAGNOSIS — I2510 Atherosclerotic heart disease of native coronary artery without angina pectoris: Secondary | ICD-10-CM

## 2010-12-02 DIAGNOSIS — I2589 Other forms of chronic ischemic heart disease: Secondary | ICD-10-CM

## 2010-12-02 DIAGNOSIS — Z01812 Encounter for preprocedural laboratory examination: Secondary | ICD-10-CM | POA: Insufficient documentation

## 2010-12-02 LAB — CBC
MCH: 31.2 pg (ref 26.0–34.0)
MCHC: 34 g/dL (ref 30.0–36.0)
Platelets: 188 10*3/uL (ref 150–400)
RBC: 4.49 MIL/uL (ref 3.87–5.11)

## 2010-12-02 LAB — URINALYSIS, ROUTINE W REFLEX MICROSCOPIC
Hgb urine dipstick: NEGATIVE
Protein, ur: NEGATIVE mg/dL
Urine Glucose, Fasting: NEGATIVE mg/dL

## 2010-12-02 LAB — COMPREHENSIVE METABOLIC PANEL
CO2: 28 mEq/L (ref 19–32)
Calcium: 9.1 mg/dL (ref 8.4–10.5)
Creatinine, Ser: 0.81 mg/dL (ref 0.4–1.2)
GFR calc non Af Amer: >60 mL/min (ref >60)
Glucose, Bld: 123 mg/dL — ABNORMAL HIGH (ref 70–99)

## 2010-12-02 LAB — URINE MICROSCOPIC-ADD ON

## 2010-12-02 LAB — PROTIME-INR: Prothrombin Time: 13 seconds (ref 11.6–15.2)

## 2010-12-05 ENCOUNTER — Inpatient Hospital Stay (HOSPITAL_COMMUNITY)
Admission: RE | Admit: 2010-12-05 | Discharge: 2010-12-08 | DRG: 484 | Disposition: A | Discharge status: Home / Self Care | Payer: Medicare Other | Source: Ambulatory Visit | Attending: Orthopedic Surgery | Admitting: Orthopedic Surgery

## 2010-12-05 DIAGNOSIS — I2510 Atherosclerotic heart disease of native coronary artery without angina pectoris: Secondary | ICD-10-CM | POA: Diagnosis present

## 2010-12-05 DIAGNOSIS — J438 Other emphysema: Secondary | ICD-10-CM

## 2010-12-05 DIAGNOSIS — Z01812 Encounter for preprocedural laboratory examination: Secondary | ICD-10-CM

## 2010-12-05 DIAGNOSIS — I10 Essential (primary) hypertension: Secondary | ICD-10-CM | POA: Diagnosis present

## 2010-12-05 DIAGNOSIS — J95821 Acute postprocedural respiratory failure: Secondary | ICD-10-CM

## 2010-12-05 DIAGNOSIS — J449 Chronic obstructive pulmonary disease, unspecified: Secondary | ICD-10-CM | POA: Diagnosis present

## 2010-12-05 DIAGNOSIS — I252 Old myocardial infarction: Secondary | ICD-10-CM

## 2010-12-05 DIAGNOSIS — J4489 Other specified chronic obstructive pulmonary disease: Secondary | ICD-10-CM | POA: Diagnosis present

## 2010-12-05 DIAGNOSIS — Z8673 Personal history of transient ischemic attack (TIA), and cerebral infarction without residual deficits: Secondary | ICD-10-CM

## 2010-12-05 DIAGNOSIS — M19019 Primary osteoarthritis, unspecified shoulder: Principal | ICD-10-CM | POA: Diagnosis present

## 2010-12-05 HISTORY — PX: TOTAL SHOULDER REPLACEMENT: SUR1217

## 2010-12-06 DIAGNOSIS — R52 Pain, unspecified: Secondary | ICD-10-CM

## 2010-12-06 DIAGNOSIS — J449 Chronic obstructive pulmonary disease, unspecified: Secondary | ICD-10-CM

## 2010-12-11 NOTE — Assessment & Plan Note (Signed)
Summary: ROV   Copy to:  na Primary Provider/Referring Provider:  [name], MD  CC:  Needs refills on Advair and ProAir sent to [facility].  History of Present Illness: 69/F, ex smoker, realtor with Gold stg III COPD FEV1 1.0 L (48%), on O2 since 10/10 for FU. She smoked for 40 years and quit in 1999.  Advair worked better than symbicort. Desaturates to 90% on ambulation 2/11  August 27, 2009  -Acute visit for 'bronchitis >> improved with avelox & low dose steroids.  November 09, 2009  -[facility] [place] for diverticulitis  April 03, 2010  SIck visit 6/11 chest cold - cefdinir helped ! (avelox too expensive) Completed course of steroids.   December 02, 2010 4:49 PM  - Pre -op visit for lt shoulder surgery, hospitalisn & [facility] planned for rehab , compliant with meds, uses O2 at night, ER visit in dec '11 - received steroids x 4 ds No nocturnal wheezing, edema, chest pain, cough   Current Medications (verified): 1)  Spiriva Handihaler 18 Mcg Caps (Tiotropium Bromide Monohydrate) .... Two Puffs Once Daily 2)  Advair Diskus 250-50 Mcg/dose  Misc (Fluticasone-Salmeterol) .Marland Kitchen.. 1 Puff Q12 Hours 3)  Proair Hfa 108 (90 Base) Mcg/act Aers (Albuterol Sulfate) .... Inhale Two Puffs Every 4 Hours As Needed 4)  Metoprolol Succinate 25 Mg Xr24h-Tab (Metoprolol Succinate) .Marland Kitchen.. 1 By Mouth Once Daily 5)  Aspirin 81 Mg Tbec (Aspirin) .... Take One Tablet By Mouth Daily 6)  Lipitor 40 Mg Tabs (Atorvastatin Calcium) .... Take One Tablet By Mouth Daily 7)  Fish Oil 1200 Mg Caps (Omega-3 Fatty Acids) .... Once Daily- On Hold Prior To Surgery 8)  Tramadol Hcl 50 Mg Tabs (Tramadol Hcl) .... Take 1 Tablet By Mouth Two Times A Day 9)  Alendronate Sodium 70 Mg  Tabs (Alendronate Sodium) .Marland Kitchen.. 1 By Mouth Qwk 10)  Calcium-Vitamin D 500-200 Mg-Unit Tabs (Calcium-Vitamin D) .... Take 1 Tablet By Mouth Two Times A Day 11)  Vitamin D3 2000 Unit Caps (Cholecalciferol) .... Take 1 Tablet By Mouth Two Times A  Day 12)  Multivitamins  Tabs (Multiple Vitamin) .... Take 1/2  Tablet By Mouth Two Times A Day 13)  Stool Softener 100 Mg Caps (Docusate Sodium) .... As Needed 14)  Study Drug-For Stoke .... Take 1 Tablet By Mouth Once Daily 15)  Nitroglycerin 0.4 Mg Subl (Nitroglycerin) .... As Needed 16)  L-Lysine Hcl 500 Mg Tabs (Lysine Hcl) .... Once Daily or Every Other Day 17)  Odorless Garlic 1250 Mg Tabs (Garlic) .... Take 1 Tablet By Mouth Once A Day 18)  Allclear .... Otc 1 Tab Once Daily 19)  Oxygen .... 2 Liters At Bedtime 20)  Urinary Tract Health 365-50 Mg Caps (Cranberry-Olive Leaf) .... Take 1 Tablet By Mouth Once A Day 21)  Prednisone 10 Mg Tabs (Prednisone) .... As Needed 22)  Zolpidem Tartrate 5 Mg Tabs (Zolpidem Tartrate) .... Take One Tab By Mouth At Bedtime As Needed For Sleep. 23)  Dexilant 60 Mg Cpdr (Dexlansoprazole) .... Take One By Mouth Once Daily 24)  Celebrex 200 Mg Caps (Celecoxib) .... Take 1 Tablet By Mouth Once A Day 25)  Robaxin 500 Mg Tabs (Methocarbamol) .... Take 1 Tablet By Mouth Two Times A Day As Needed 26)  Oxycodone .... As Needed  Allergies (verified): 1)  ! Sulfa 2)  ! Codeine  Past History:  Past Medical History: Last updated: 12/02/2010 Right bimallleolar ankle fracture--12/2005 Right lateral ankle wound infection, probable methicillin sensitive staph aureua--  01/2006 Coronary artery disease S/P MI 1990, S/P DES CFX 04/2009 Osteopenia Tobacco abuse, hx of (D/Ced 10/20/1997) COPD      - Spirometry 07/25/09 FEV1 0.85(41%), FVC 2.09(72%), FEV1% 41 Hypoxemia      - Pulse ox 86% room air 07/25/09      - 2 liters with exertion and sleep  Cerebrovascular accident, hx of ,S/P TPA 04/20/2008, Dr Pearlean Brownie Diverticulitis, hx of, 11/09 Tubular Adenoma- high grade dysplasia 2007 BENIGN POSITIONAL VERTIGO (ICD-386.11) EMPHYSEMA (ICD-492.8) ACUTE GASTR ULCER W/PERF W/O MENTION OBSTRUCTION (ICD-531.10) ARTHRITIS, LEFT SHOULDER (ICD-716.91) GASTRIC ULCER  (ICD-531.90) MYOCARDIAL INFARCTION, HX OF (ICD-412) Hypertension  Social History: Last updated: 12/02/2010 [employer] Tate Patient is a former smoker. -stopped in 1999 Alcohol Use - yes-occasion Daily Caffeine Use-almost every day Illicit Drug Use - no Patient does not get regular exercise.   Review of Systems       The patient complains of dyspnea on exertion.  The patient denies anorexia, fever, weight loss, weight gain, vision loss, decreased hearing, hoarseness, chest pain, syncope, peripheral edema, prolonged cough, headaches, hemoptysis, abdominal pain, melena, hematochezia, severe indigestion/heartburn, hematuria, muscle weakness, suspicious skin lesions, difficulty walking, depression, unusual weight change, abnormal bleeding, enlarged lymph nodes, and angioedema.    Vital Signs:  Patient profile:   70 year old female Height:      65 inches Weight:      171.6 pounds BMI:     28.66 O2 Sat:      93 % on Room air Temp:     97.4 degrees F oral Pulse rate:   78 / minute BP sitting:   112 / 64  (left arm) Cuff size:   large  Vitals Entered By: Zackery Barefoot CMA (December 02, 2010 4:30 PM)  O2 Flow:  Room air   Physical Exam  Additional Exam:  wt 174 August 30, 2010 >> 172 December 02, 2010  Gen. Pleasant, well-nourished, in no distress ENT - no lesions, no post nasal drip Neck: No JVD, no thyromegaly, no carotid bruits Lungs: no use of accessory muscles, no dullness to percussion, clear without rales, no rhonchi  Cardiovascular: Rhythm regular, heart sounds  normal, no murmurs or gallops, no peripheral edema Musculoskeletal: No deformities, no cyanosis or clubbing      Impression & Recommendations:  Problem # 1:  COPD (ICD-496) Take advair & spiriva on day of surgery  Use albuterol nebs q 6h  during hospital stay Stress dose steroids on morning of surgery - 100 mg IV hydrocortisone (since she last received steroids in dec'11)  Problem # 2:  PREOPERATIVE  EXAMINATION (ICD-V72.84)  Cleared for shoulder surgery At mild- moderate risk for post op pulmonary complications  If she develops bronchospasm, pl call us  Orders: Est. Patient Level III (16109)  Medications Added to Medication List This Visit: 1)  Tramadol Hcl 50 Mg Tabs (Tramadol hcl) .... Take 1 tablet by mouth two times a day 2)  Stool Softener 100 Mg Caps (Docusate sodium) .... As needed 3)  Celebrex 200 Mg Caps (Celecoxib) .... Take 1 tablet by mouth once a day 4)  Robaxin 500 Mg Tabs (Methocarbamol) .... Take 1 tablet by mouth two times a day as needed 5)  Oxycodone  .... As needed  Patient Instructions: 1)  Copy sent to: D Supple 2)  Please schedule a follow-up appointment in 3 months. 3)  Cleared for shoulder surgery 4)  Take advair & spiriva on day of surgery  5)  Use albuterol nebs during hospital stay  6)  Shot of steroids on morning of surgery  Appended Document: ROV faxed to [phone] attn Georgeanna Lea

## 2010-12-11 NOTE — Letter (Signed)
Summary: Reclast Reminder-Patient Declined due to cost  Reclast Reminder-Patient Declined due to cost   Imported By: Maryln Gottron 12/02/2010 14:51:41  _____________________________________________________________________  External Attachment:    Type:   Image     Comment:   External Document

## 2010-12-11 NOTE — Assessment & Plan Note (Signed)
Summary: EC6/SUR/SHLDR SURG/BCBS/MEDICARE,MJ/PR CHECKOUT 09/24/10 FRMR ...   Visit Type:  Surgical clearance Referring Provider:  na Primary Provider:  [name], MD  CC:  Left shoulder pain.  History of Present Illness: 70 yo female with history of CAD s/p previous stenting procedures, CVA, ischemic cardiomyopathy, COPD who is here today for cardiac follow up. She has been followed in the past by Dr. Juanda Chance and was most recently seen in December 2011 for surgical risk assessment prior to planned  She initially had an anterior MI in 1990  treated with TPA followed by PTCA. In 2010 in July she had a drug eluding stent to the circumflex artery for unstable angina.   She underwent catheterization 2010 for evaluation of SOB and was found to have nonobstructive coronary disease. Her stent in the circumflex artery was widely patent. Her ejection fraction was 45% and a pulmonary wedge pressure was normal. Her symptoms were felt to be secondary to her COPD. She has had a previous stroke treated with TPA in 2009. She also has severe obstructive pulmonary disease with an FEV1 of less than 1 L.  No chest pain or change in SOB. No palpitations, near syncope, syncope. She is actively working without limitations. She is seeing Dr. Vassie Loll later today for pulmonary clearance. Overall doing well except for left shoulder pain.     Problems Prior to Update: 1)  Cardiomyopathy, Ischemic  (ICD-414.8) 2)  Preoperative Examination  (ICD-V72.84) 3)  Abdominal Pain, Left Lower Quadrant  (ICD-789.04) 4)  Benign Positional Vertigo  (ICD-386.11) 5)  Emphysema  (ICD-492.8) 6)  Acute Bronchitis  (ICD-466.0) 7)  Cad, Native Vessel  (ICD-414.01) 8)  Chest Pain  (ICD-786.50) 9)  Dyspnea  (ICD-786.05) 10)  Diverticulitis-colon  (ICD-562.11) 11)  Hypoxemia  (ICD-799.02) 12)  Cva  (ICD-434.91) 13)  Hyperlipidemia-mixed  (ICD-272.4) 14)  Ulcer-gastric  (ICD-531.9) 15)  Personal Hx Colonic Polyps  (ICD-V12.72) 16)   Diarrhea-presumed Infectious  (ICD-009.3) 17)  Diverticulosis-colon  (ICD-562.10) 18)  Abdominal Pain-generalized  (ICD-789.07) 19)  Abdominal Pain, Generalized, Chronic  (ICD-789.07) 20)  Infectious Diarrhea  (ICD-009.2) 21)  Chronic Obstructive Pulmonary Disease, Acute Exacerbation  (ICD-491.21) 22)  Acut Gastr Ulcer W/perf w/o Mention Obstruction  (ICD-531.10) 23)  Colonic Polyps, Hx of  (ICD-V12.72) 24)  Diverticulosis, Colon  (ICD-562.10) 25)  Arthritis, Left Shoulder  (ICD-716.91) 26)  Gastric Ulcer  (ICD-531.90) 27)  Candidiasis  (ICD-112.9) 28)  Diverticulitis, Hx of  (ICD-V12.79) 29)  Cerebrovascular Accident, Hx of  (ICD-V12.50) 30)  External Otitis  (ICD-380.10) 31)  COPD  (ICD-496) 32)  Infection, Staphylococcus Nec  (ICD-041.19) 33)  Cellulitis/abscess Nos  (ICD-682.9) 34)  Fracture, Ankle, Right  (ICD-824.8) 35)  Wound Infection  (ICD-686.9) 36)  Coronary Artery Disease  (ICD-414.00) 37)  Tobacco Abuse, Hx of  (ICD-V15.82) 38)  Osteopenia  (ICD-733.90) 39)  Myocardial Infarction, Hx of  (ICD-412)  Current Medications (verified): 1)  Spiriva Handihaler 18 Mcg Caps (Tiotropium Bromide Monohydrate) .... Two Puffs Once Daily 2)  Advair Diskus 250-50 Mcg/dose  Misc (Fluticasone-Salmeterol) .Marland Kitchen.. 1 Puff Q12 Hours 3)  Proair Hfa 108 (90 Base) Mcg/act Aers (Albuterol Sulfate) .... Inhale Two Puffs Every 4 Hours As Needed 4)  Metoprolol Succinate 25 Mg Xr24h-Tab (Metoprolol Succinate) .Marland Kitchen.. 1 By Mouth Once Daily(Lost Medicine Has Not Taken in Two Days Will Get Replaced and Resume 5)  Aspirin 81 Mg Tbec (Aspirin) .... Take One Tablet By Mouth Daily 6)  Simvastatin 40 Mg  Tabs (Simvastatin) .Marland Kitchen.. 1 By Mouth At Bedtime 7)  Fish Oil 1200 Mg Caps (Omega-3 Fatty Acids) .... Once Daily- On Hold Prior To Surgery 8)  Tramadol Hcl 50 Mg Tabs (Tramadol Hcl) .... Take 1 Tab By Mouth At Bedtime 9)  Alendronate Sodium 70 Mg  Tabs (Alendronate Sodium) .Marland Kitchen.. 1 By Mouth Qwk 10)  Calcium-Vitamin D  500-200 Mg-Unit Tabs (Calcium-Vitamin D) .... Take 1 Tablet By Mouth Two Times A Day 11)  Vitamin D3 2000 Unit Caps (Cholecalciferol) .... Take 1 Tablet By Mouth Two Times A Day 12)  Multivitamins  Tabs (Multiple Vitamin) .... Take 1/2  Tablet By Mouth Two Times A Day 13)  Doqlace 100 Mg .... Take 1 Tablet By Mouth Once A Day 14)  Study Drug-For Stoke .... Take 1 Tablet By Mouth Once Daily 15)  Nitroglycerin 0.4 Mg Subl (Nitroglycerin) .... As Needed 16)  L-Lysine Hcl 500 Mg Tabs (Lysine Hcl) .... Once Daily or Every Other Day 17)  Odorless Garlic 1250 Mg Tabs (Garlic) .... Take 1 Tablet By Mouth Once A Day 18)  Allclear .... Otc 1 Tab Once Daily 19)  Oxygen .... 2 Liters At Bedtime 20)  Urinary Tract Health 365-50 Mg Caps (Cranberry-Olive Leaf) .... Take 1 Tablet By Mouth Once A Day 21)  Prednisone 10 Mg Tabs (Prednisone) .... As Needed 22)  Zolpidem Tartrate 5 Mg Tabs (Zolpidem Tartrate) .... Take One Tab By Mouth At Bedtime As Needed For Sleep. 23)  Dexilant 60 Mg Cpdr (Dexlansoprazole) .... Take One By Mouth Once Daily  Allergies (verified): 1)  ! Sulfa 2)  ! Codeine  Past History:  Past Medical History: Right bimallleolar ankle fracture--12/2005 Right lateral ankle wound infection, probable methicillin sensitive staph aureua-- 01/2006 Coronary artery disease S/P MI 1990, S/P DES CFX 04/2009 Osteopenia Tobacco abuse, hx of (D/Ced 10/20/1997) COPD      - Spirometry 07/25/09 FEV1 0.85(41%), FVC 2.09(72%), FEV1% 41 Hypoxemia      - Pulse ox 86% room air 07/25/09      - 2 liters with exertion and sleep  Cerebrovascular accident, hx of ,S/P TPA 04/20/2008, Dr Pearlean Brownie Diverticulitis, hx of, 11/09 Tubular Adenoma- high grade dysplasia 2007 BENIGN POSITIONAL VERTIGO (ICD-386.11) EMPHYSEMA (ICD-492.8) ACUTE GASTR ULCER W/PERF W/O MENTION OBSTRUCTION (ICD-531.10) ARTHRITIS, LEFT SHOULDER (ICD-716.91) GASTRIC ULCER (ICD-531.90) MYOCARDIAL INFARCTION, HX OF (ICD-412) Hypertension  Past  Surgical History: Reviewed history from 09/22/2008 and no changes required. ORIF--Right ankle--01/2006 Hysterectomy--TAH/BSO Percutaneous transluminal coronary angioplasty Bilateral Breast Augmentation Endo 08/24/08 : duodenal polyp, Candida esophagitis,4 mm non bleeding antral ulcer, Dr Marina Goodell appendectomy vein stripping x 2 legs "martial megeti" (bladder)  Family History: Reviewed history from 09/22/2008 and no changes required. Family History of Breast Cancer: Sister x 2 No FH of Colon Cancer: Family History of Pancreatic Cancer: 2nd cousin Family History of Colon Polyps: Sister Family History of Diabetes: Paternal Grandmother, Maternal Uncle Family History of Heart Disease: Mother, Father, Maternal Grandfather, Paternal Grandfather Family History of Uterine Cancer: Paternal Grandmother  Social History: Reviewed history from 09/17/2010 and no changes required. 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 -stopped in 1999 Alcohol Use - yes-occasion Daily Caffeine Use-almost every day Illicit Drug Use - no Patient does not get regular exercise.   Review of Systems       The patient complains of shortness of breath and joint pain.  The patient denies fatigue, malaise, fever, weight gain/loss, vision loss, decreased hearing, hoarseness, chest pain, palpitations, prolonged cough, wheezing, sleep apnea, coughing up blood, abdominal pain, blood in stool, nausea, vomiting, diarrhea, heartburn, incontinence, blood in  urine, muscle weakness, leg swelling, rash, skin lesions, headache, fainting, dizziness, depression, anxiety, enlarged lymph nodes, easy bruising or bleeding, and environmental allergies.    Vital Signs:  Patient profile:   70 year old female Height:      65 inches Weight:      169 pounds Pulse rate:   75 / minute Resp:     14 per minute BP sitting:   132 / 74  (left arm)  Vitals Entered By: Ellender Hose RN (December 02, 2010 11:42 AM)  Physical Exam  General:   General: Well developed, well nourished, NAD HEENT: OP clear, mucus membranes moist SKIN: warm, dry Neuro: No focal deficits Musculoskeletal: Muscle strength 5/5 all ext Psychiatric: Mood and affect normal Neck: No JVD, no carotid bruits, no thyromegaly, no lymphadenopathy. Lungs:Clear bilaterally, no wheezes, rhonci, crackles CV: RRR no murmurs, gallops rubs Abdomen: soft, NT, ND, BS present Extremities: No edema, pulses 2+.    Impression & Recommendations:  Problem # 1:  CAD, NATIVE VESSEL (ICD-414.01) Stable. Will change to Lipitor from Zocor per pt request.   Her updated medication list for this problem includes:    Metoprolol Succinate 25 Mg Xr24h-tab (Metoprolol succinate) .Marland Kitchen... 1 by mouth once daily(lost medicine has not taken in two days will get replaced and resume    Aspirin 81 Mg Tbec (Aspirin) .Marland Kitchen... Take one tablet by mouth daily    Nitroglycerin 0.4 Mg Subl (Nitroglycerin) .Marland Kitchen... As needed  Problem # 2:  CARDIOMYOPATHY, ISCHEMIC (ICD-414.8) Stable. Volume status ok.   Her updated medication list for this problem includes:    Metoprolol Succinate 25 Mg Xr24h-tab (Metoprolol succinate) .Marland Kitchen... 1 by mouth once daily(lost medicine has not taken in two days will get replaced and resume    Aspirin 81 Mg Tbec (Aspirin) .Marland Kitchen... Take one tablet by mouth daily    Nitroglycerin 0.4 Mg Subl (Nitroglycerin) .Marland Kitchen... As needed  Problem # 3:  PREOPERATIVE EXAMINATION (ICD-V72.84) No active chest pain to suggest angina. Her SOB is at baseline. She had revascularization with placement of a drug eluting stent in the Circumflex artery in July 2010. No further ischemic testing or cardiac workup prior to planned shoulder surgery later this week.   Problem # 4:  HYPERTENSION, BENIGN (ICD-401.1) Stable. No changes.   Her updated medication list for this problem includes:    Metoprolol Succinate 25 Mg Xr24h-tab (Metoprolol succinate) .Marland Kitchen... 1 by mouth once daily(lost medicine has not taken in two  days will get replaced and resume    Aspirin 81 Mg Tbec (Aspirin) .Marland Kitchen... Take one tablet by mouth daily  Patient Instructions: 1)  Your physician recommends that you schedule a follow-up appointment in: 6 months 2)  Your physician has recommended you make the following change in your medication: STOP ZOCOR and START LIPITOR 40mg  by mouth daily.

## 2010-12-23 NOTE — Op Note (Signed)
NAME:  [name], [name] NO.:  [id number]  MEDICAL RECORD NO.:  [id number]           PATIENT TYPE:  I  LOCATION:  5041                         FACILITY:  [facility]  PHYSICIAN:  Vania Rea. Uilani Sanville, M.D.  DATE OF BIRTH:  1940/11/21  DATE OF PROCEDURE:  12/05/2010 DATE OF DISCHARGE:                              OPERATIVE REPORT   PREOPERATIVE DIAGNOSIS:  End-stage left shoulder osteoarthrosis.  POSTOPERATIVE DIAGNOSIS:  End-stage left shoulder osteoarthrosis.  PROCEDURE:  Left total shoulder arthroplasty utilizing a DePuy global size 12 Press-Fit stem with a 44 x 15 head and a cemented pegged 44 glenoid.  SURGEON:  Vania Rea. Rece Zechman, MD  ASSISTANT:  Lucita Lora. Shuford PA-C  ANESTHESIA:  General endotracheal as well as an interscalene block.  ESTIMATED BLOOD LOSS:  300 mL.  DRAINS:  None.  HISTORY:  [name] is a 70 year old female who has had chronic and progressive increasing left shoulder pain with functional limitations secondary to end-stage arthrosis.  She is brought to the operating room at this time for planned left total shoulder arthroplasty.  Preoperatively, we counseled [name] on treatment options as well as risks versus benefits thereof.  Possible surgical complications were reviewed including potential for bleeding, infection, neurovascular injury, persistent pain, loss of motion, failure of the implant, anesthetic complication and possible need for additional surgery.  She understands and accepts and agrees with our planned procedure.  PROCEDURE IN DETAIL:  After undergoing routine preop evaluation, the patient received prophylactic antibiotics and an interscalene block was established in the holding area by the Anesthesia Department.  Placed supine on the operating table, underwent smooth induction of general endotracheal anesthesia.  Placed into beach-chair position and appropriately padded and protected.  The left shoulder girdle region  was sterilely prepped and draped in standard fashion.  Time-out was called. An anterior approach of the left shoulder was made through a 10-cm incision along the deltopectoral interval.  Skin flaps were developed and electrocautery was used for hemostasis.  The cephalic vein was identified and was retracted laterally with the deltoid and pectoralis was retracted medially in the upper centimeter of the pec major tendon was tenotomized.  Self-retaining retractors were placed and adhesions in the subacromial/subdeltoid bursal area were divided and Brau retractor was placed.  Conjoined tendon was retracted medially.  The bicipital groove was then unroofed.  Biceps tendon was tenotomized proximally and then, we divided the subscapularis away from the lesser tuberosity leaving a 1.5-cm cuff tissue for later repair of the subscap.  The free margin of the subscap was intact with a series of #2 FiberWire sutures. Rotator interval was then split to the base of the coracoid.  At this point, the humeral head was then delivered through the wound and therotator cuff was carefully protected superiorly and posteriorly.  Using an extramedullary guide, we then marked the proposed humeral head osteotomy and this was then performed with an oscillating saw maintaining approximately 30 degrees of retroversion and certainly reproducing her native retroversion.  We then removed a large osteophyte that had developed on the anterior and inferior margins of the humeral head and neck region.  Hand reaming was then performed with the humeral canal up to size 12 with excellent fit.  We then used a cookie cutter osteotome and then broached up to size 12 with excellent fit and purchase.  Trial was then removed.  Metal cap placed in the cut surface of the proximal humerus and then, we exposed the glenoid utilizing a combination of Fukuda pitchfork and snake tongue retractors.  We performed a circumferential labral  resection and capsular release and also mobilized the subscapularis anteriorly.  This allowed excellent peripheral view of the entire glenoid.  It was sized with a 44 showing best coverage and fit.  A central guidepin was placed.  The 44 reamer was used to create a smooth subchondral bone surface.  Several cystic areas were identified.  The central drill hole was then placed and then using the proper guide, the peripheral peg holes were all drilled. Trial 44 showed excellent fit.  At this point, the glenoid was irrigated.  Cement was mixed and cement was then introduced into the peripheral peg holes and the final glenoid was then impacted its position and held until the cement hardened showing excellent fit.  At this point, we then turned our attention to the proximal humerus where the canal was irrigated, size 12 stem was initially inserted part way. Bone graft was then placed then proximally at the metaphyseal portion of the stem and the stem was then terminally implanted at approximately 30 degrees of retroversion obtaining excellent bony fit and purchase.  We then performed trial reductions and the 44 x 15 showed the best soft tissue balance with approximately 50% posterior translation of the humeral head across the glenoid.  At this point, the final size 44 x 15 head was impacted after the [facility] taper was meticulously cleaned and dried.  Final reduction was performed.  The soft tissue balance and mobility much to our satisfaction.  At this point, the subscapularis was then repaired back to the cuff of tissue on the lesser tuberosity utilizing the #2 FiberWires.  Once this was completed, we easily achieved 30 degrees of external rotation.  The rotator interval was then closed anterosuperiorly and biceps tendon tenodesed into the bicipital groove region.  Overall construct was much to our satisfaction.  The wound was copiously irrigated.  The hemostasis was obtained.  The deltopectoral  interval was then reapproximated with a series of 0 Vicryl sutures.  A tag of #2 FiberWire was placed at the midpoint.  A 2-0 Vicryl was used for the subcu layer and intracuticular 3-0 Monocryl for the skin followed by Steri-Strips.  Dry dressing was placed over the left shoulder, left arm was placed in sling immobilizer.  The patient was then awakened, extubated and taken to the recovery room in stable condition.     Vania Rea. Cyra Spader, M.D.     KMS/MEDQ  D:  12/05/2010  T:  12/06/2010  Job:  [id number]  Electronically Signed by Francena Hanly M.D. on 12/23/2010 05:03:19 PM

## 2010-12-30 LAB — DIFFERENTIAL
Basophils Absolute: 0 10*3/uL (ref 0.0–0.1)
Basophils Relative: 1 % (ref 0–1)
Eosinophils Absolute: 0.4 10*3/uL (ref 0.0–0.7)
Eosinophils Relative: 5 % (ref 0–5)
Lymphocytes Relative: 21 % (ref 12–46)
Lymphs Abs: 1.6 10*3/uL (ref 0.7–4.0)
Monocytes Absolute: 0.7 10*3/uL (ref 0.1–1.0)
Monocytes Relative: 9 % (ref 3–12)
Neutro Abs: 5.1 10*3/uL (ref 1.7–7.7)
Neutrophils Relative %: 65 % (ref 43–77)

## 2010-12-30 LAB — BASIC METABOLIC PANEL
Calcium: 9.2 mg/dL (ref 8.4–10.5)
Chloride: 111 mEq/L (ref 96–112)
Creatinine, Ser: 0.85 mg/dL (ref 0.4–1.2)
GFR calc Af Amer: >60 mL/min (ref >60)

## 2010-12-30 LAB — CBC
MCV: 93.7 fL (ref 78.0–100.0)
Platelets: 217 10*3/uL (ref 150–400)
RBC: 4.27 MIL/uL (ref 3.87–5.11)
WBC: 7.8 10*3/uL (ref 4.0–10.5)

## 2010-12-30 LAB — POCT CARDIAC MARKERS
CKMB, poc: 2.6 ng/mL (ref 1.0–8.0)
Troponin i, poc: <0.05 ng/mL (ref 0.00–0.09)

## 2010-12-30 LAB — APTT: aPTT: 35 seconds (ref 24–37)

## 2011-01-03 LAB — DIFFERENTIAL
Basophils Relative: 0 % (ref 0–1)
Eosinophils Absolute: 0.3 10*3/uL (ref 0.0–0.7)
Eosinophils Absolute: 0.3 10*3/uL (ref 0.0–0.7)
Eosinophils Relative: 3 % (ref 0–5)
Eosinophils Relative: 4 % (ref 0–5)
Lymphs Abs: 2.2 10*3/uL (ref 0.7–4.0)
Lymphs Abs: 2.3 10*3/uL (ref 0.7–4.0)
Monocytes Absolute: 0.7 10*3/uL (ref 0.1–1.0)
Monocytes Relative: 10 % (ref 3–12)
Monocytes Relative: 8 % (ref 3–12)

## 2011-01-03 LAB — MAGNESIUM: Magnesium: 2.4 mg/dL (ref 1.5–2.5)

## 2011-01-03 LAB — CARDIAC PANEL(CRET KIN+CKTOT+MB+TROPI)
CK, MB: 1.5 ng/mL (ref 0.3–4.0)
CK, MB: 1.7 ng/mL (ref 0.3–4.0)
Relative Index: INVALID (ref 0.0–2.5)
Total CK: 47 U/L (ref 7–177)
Total CK: 59 U/L (ref 7–177)
Troponin I: 0.01 ng/mL (ref 0.00–0.06)
Troponin I: <0.01 ng/mL (ref 0.00–0.06)

## 2011-01-03 LAB — CBC
HCT: 42.7 % (ref 36.0–46.0)
Hemoglobin: 13.8 g/dL (ref 12.0–15.0)
Hemoglobin: 14.8 g/dL (ref 12.0–15.0)
MCHC: 34.3 g/dL (ref 30.0–36.0)
MCHC: 34.6 g/dL (ref 30.0–36.0)
RBC: 4.58 MIL/uL (ref 3.87–5.11)
WBC: 8.7 10*3/uL (ref 4.0–10.5)

## 2011-01-03 LAB — POCT CARDIAC MARKERS
CKMB, poc: 1.2 ng/mL (ref 1.0–8.0)
Troponin i, poc: <0.05 ng/mL (ref 0.00–0.09)

## 2011-01-03 LAB — COMPREHENSIVE METABOLIC PANEL
ALT: 19 U/L (ref 0–35)
ALT: 23 U/L (ref 0–35)
AST: 18 U/L (ref 0–37)
AST: 22 U/L (ref 0–37)
Alkaline Phosphatase: 96 U/L (ref 39–117)
CO2: 25 mEq/L (ref 19–32)
CO2: 27 mEq/L (ref 19–32)
Calcium: 9.3 mg/dL (ref 8.4–10.5)
Calcium: 9.6 mg/dL (ref 8.4–10.5)
GFR calc Af Amer: >60 mL/min (ref >60)
GFR calc Af Amer: >60 mL/min (ref >60)
GFR calc non Af Amer: >60 mL/min (ref >60)
GFR calc non Af Amer: >60 mL/min (ref >60)
Potassium: 4 mEq/L (ref 3.5–5.1)
Sodium: 141 mEq/L (ref 135–145)
Sodium: 142 mEq/L (ref 135–145)
Total Protein: 5.7 g/dL — ABNORMAL LOW (ref 6.0–8.3)

## 2011-01-03 LAB — POCT I-STAT, CHEM 8
Calcium, Ion: 1.23 mmol/L (ref 1.12–1.32)
Chloride: 108 mEq/L (ref 96–112)
Creatinine, Ser: 0.9 mg/dL (ref 0.4–1.2)
Glucose, Bld: 122 mg/dL — ABNORMAL HIGH (ref 70–99)
Potassium: 3.9 mEq/L (ref 3.5–5.1)

## 2011-01-03 LAB — PROTIME-INR: Prothrombin Time: 13.3 seconds (ref 11.6–15.2)

## 2011-01-03 LAB — URINE MICROSCOPIC-ADD ON

## 2011-01-03 LAB — URINALYSIS, ROUTINE W REFLEX MICROSCOPIC
Nitrite: NEGATIVE
Specific Gravity, Urine: 1.024 (ref 1.005–1.030)
Urobilinogen, UA: 0.2 mg/dL (ref 0.0–1.0)

## 2011-01-03 LAB — LIPID PANEL
Triglycerides: 77 mg/dL (ref <150)
VLDL: 15 mg/dL (ref 0–40)

## 2011-01-05 LAB — BASIC METABOLIC PANEL
CO2: 26 mEq/L (ref 19–32)
Calcium: 8.4 mg/dL (ref 8.4–10.5)
GFR calc Af Amer: >60 mL/min (ref >60)
GFR calc non Af Amer: 57 mL/min — ABNORMAL LOW (ref >60)
Sodium: 143 mEq/L (ref 135–145)

## 2011-01-05 LAB — DIFFERENTIAL
Basophils Absolute: 0 10*3/uL (ref 0.0–0.1)
Basophils Relative: 0 % (ref 0–1)
Eosinophils Absolute: 0.3 10*3/uL (ref 0.0–0.7)
Eosinophils Relative: 2 % (ref 0–5)
Eosinophils Relative: 6 % — ABNORMAL HIGH (ref 0–5)
Lymphocytes Relative: 17 % (ref 12–46)
Lymphs Abs: 1.8 10*3/uL (ref 0.7–4.0)
Lymphs Abs: 2 10*3/uL (ref 0.7–4.0)
Neutro Abs: 4.2 10*3/uL (ref 1.7–7.7)
Neutro Abs: 4.2 10*3/uL (ref 1.7–7.7)
Neutrophils Relative %: 59 % (ref 43–77)
Neutrophils Relative %: 65 % (ref 43–77)

## 2011-01-05 LAB — CARDIAC PANEL(CRET KIN+CKTOT+MB+TROPI)
CK, MB: 1.6 ng/mL (ref 0.3–4.0)
Relative Index: INVALID (ref 0.0–2.5)
Relative Index: INVALID (ref 0.0–2.5)
Relative Index: INVALID (ref 0.0–2.5)
Total CK: 63 U/L (ref 7–177)
Total CK: 67 U/L (ref 7–177)
Total CK: 69 U/L (ref 7–177)
Troponin I: <0.01 ng/mL (ref 0.00–0.06)

## 2011-01-05 LAB — POCT I-STAT, CHEM 8
BUN: 21 mg/dL (ref 6–23)
Chloride: 104 mEq/L (ref 96–112)
Creatinine, Ser: 0.9 mg/dL (ref 0.4–1.2)
Sodium: 139 mEq/L (ref 135–145)
TCO2: 28 mmol/L (ref 0–100)

## 2011-01-05 LAB — CBC
HCT: 43.3 % (ref 36.0–46.0)
Hemoglobin: 14.4 g/dL (ref 12.0–15.0)
MCHC: 33.7 g/dL (ref 30.0–36.0)
MCHC: 33.8 g/dL (ref 30.0–36.0)
MCV: 93.1 fL (ref 78.0–100.0)
MCV: 93.4 fL (ref 78.0–100.0)
Platelets: 151 10*3/uL (ref 150–400)
Platelets: 185 10*3/uL (ref 150–400)
RDW: 12.9 % (ref 11.5–15.5)
RDW: 13 % (ref 11.5–15.5)
WBC: 11.9 10*3/uL — ABNORMAL HIGH (ref 4.0–10.5)

## 2011-01-08 LAB — POCT I-STAT 3, VENOUS BLOOD GAS (G3P V)
Bicarbonate: 24.8 mEq/L — ABNORMAL HIGH (ref 20.0–24.0)
TCO2: 26 mmol/L (ref 0–100)
pCO2, Ven: 46.9 mmHg (ref 45.0–50.0)
pH, Ven: 7.33 — ABNORMAL HIGH (ref 7.250–7.300)

## 2011-01-08 LAB — POCT I-STAT 3, ART BLOOD GAS (G3+)
Acid-base deficit: 1 mmol/L (ref 0.0–2.0)
Bicarbonate: 23.7 mEq/L (ref 20.0–24.0)
O2 Saturation: 95 %
pO2, Arterial: 73 mmHg — ABNORMAL LOW (ref 80.0–100.0)

## 2011-01-15 ENCOUNTER — Encounter: Payer: Self-pay | Admitting: Pulmonary Disease

## 2011-01-20 ENCOUNTER — Ambulatory Visit (INDEPENDENT_AMBULATORY_CARE_PROVIDER_SITE_OTHER): Payer: Medicare Other | Admitting: Pulmonary Disease

## 2011-01-20 ENCOUNTER — Encounter: Payer: Self-pay | Admitting: Pulmonary Disease

## 2011-01-20 DIAGNOSIS — J449 Chronic obstructive pulmonary disease, unspecified: Secondary | ICD-10-CM

## 2011-01-20 MED ORDER — OMEPRAZOLE 20 MG PO CPDR: 20.0000 mg | DELAYED_RELEASE_CAPSULE | Freq: Every day | ORAL | Status: DC

## 2011-01-20 NOTE — Progress Notes (Signed)
  Subjective:    Patient ID: [name], female    DOB: 1941/06/12, 70 y.o.   MRN: [id number]  HPI 69/F, ex smoker, realtor with Gold stg III COPD FEV1 1.0 L (48%), on O2 since 10/10 for FU.  She smoked for 40 years and quit in 1999. Advair worked better than symbicort.  Desaturates to 90% on ambulation 2/11   August 27, 2009 -Acute visit for 'bronchitis >> improved with avelox & low dose steroids.  November 09, 2009 -Adm [place] for diverticulitis   April 03, 2010  SIck visit 6/11 chest cold - cefdinir helped ! (avelox too expensive) Completed course of steroids.   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       Review of SystemsNo nocturnal wheezing, edema, chest pain, cough Pt denies any significant  nasal congestion or excess secretions, fever, chills, sweats, unintended wt loss, pleuritic or exertional cp, orthopnea pnd or leg swelling.  Pt also denies any obvious fluctuation in symptoms with weather or environmental change or other alleviating or aggravating factors.    Pt denies any increase in rescue therapy over baseline, denies waking up needing it or having early am exacerbations or coughing/wheezing/ or dyspnea      Objective:   Physical Exam Gen. Pleasant, well-nourished, in no distress ENT - no lesions, no post nasal drip Neck: No JVD, no thyromegaly, no carotid bruits Lungs: no use of accessory muscles, no dullness to percussion, decreased without rales or rhonchi  Cardiovascular: Rhythm regular, heart sounds  normal, no murmurs or gallops, no peripheral edema Musculoskeletal: No  deformities, no cyanosis or clubbing          Assessment & Plan:

## 2011-01-20 NOTE — Discharge Summary (Signed)
  NAMEKEYLEN, UZELAC              ACCOUNT NO.:  [id number]  MEDICAL RECORD NO.:  [id number]           PATIENT TYPE:  O  LOCATION:  SDS                          FACILITY:  [facility]  PHYSICIAN:  Vania Rea. Keats Kingry, M.D.  DATE OF BIRTH:  07-19-1941  DATE OF ADMISSION:  12/02/2010 DATE OF DISCHARGE:  12/02/2010                        DISCHARGE SUMMARY - REFERRING   ADDENDUM: Ms. Pacey had laryngitis.  Ms. Deremer was kept through the weekend till today's date 12/08/2010 where she remained medically and orthopedically stable.  Her pain was under much better control.  She was having some constipation problems and this was treated with mag citrate.  Neurovascularly, she is intact.  She was afebrile and vitals signs were stable.  At this time, she was stable for discharge to [facility] for skilled care.  Please see previous orders, nothing has been changed.     Tracy A. Shuford, P.A.-C.   ______________________________ Vania Rea. Carly Applegate, M.D.    TAS/MEDQ  D:  12/08/2010  T:  12/08/2010  Job:  [id number]  Electronically Signed by Ralene Bathe P.A.-C. on 01/06/2011 08:41:33 AM Electronically Signed by Francena Hanly M.D. on 01/20/2011 12:50:31 PM

## 2011-01-20 NOTE — Patient Instructions (Addendum)
Ask research program if they have any studies ongoing for Gold stg 3 COPD Reflux medication sent to pharmacy

## 2011-01-20 NOTE — Discharge Summary (Signed)
[name], [name]              ACCOUNT NO.:  [id number]  MEDICAL RECORD NO.:  [id number]           PATIENT TYPE:  O  LOCATION:  SDS                          FACILITY:  [facility]  PHYSICIAN:  Vania Rea. Lenzie Montesano, M.D.  DATE OF BIRTH:  07/24/1941  DATE OF ADMISSION:  12/02/2010 DATE OF DISCHARGE:  12/08/2010                        DISCHARGE SUMMARY - REFERRING   ADMISSION DIAGNOSES: 1. End-stage osteoarthrosis, left shoulder. 2. Chronic obstructive pulmonary disease. 3. Coronary artery disease. 4. History of prior methicillin-resistant Staph infections.  DISCHARGE DIAGNOSES: 1. End-stage osteoarthrosis, left shoulder. 2. Chronic obstructive pulmonary disease. 3. Coronary artery disease. 4. History of prior methicillin-resistant Staph infections. 5. Status post left total shoulder arthroplasty.  OPERATIONS:  Left total shoulder arthroplasty.  SURGEON:  Vania Rea. Maraya Gwilliam, MD.  Threasa HeadsLucita Lora. Shuford, P.A.-C. under general anesthetic with interscalene block.  BRIEF HISTORY:  [name] is a 70 year old female who has been followed by [place] outpatient for ongoing complaints of left shoulder pain due to her end-stage osteoarthrosis.  She has failed outpatient conservative measures and wished to proceed with total shoulder arthroplasty as indicated.  Postoperatively, she has limited help and so would need to be transferred to skilled care.  This was arranged with [facility] prior to her admission to the hospital.  HOSPITAL COURSE:  The patient was admitted and underwent above-named procedure and tolerated this well.  All appropriate IV antibiotics and analgesics were utilized.  Vancomycin was used due to her history of MRSA even though she did have a negative Staph swab for MRSA preoperatively.  Postoperatively, the patient had a quite difficult time with pain control in the initial first 24 hours and actual PCA was ordered.  We did call Pulmonology to see and monitor her  because of her advanced pulmonary disease.  They ordered her bronchodilators and nebulizer treatments.  The first day, she had difficult time controlling pain while not become an apneic.  Postoperative morning 1, she was afebrile.  Her vital signs were all stable.  She complained of severe pain.  Her pain medicines were being modified as I speak to ensure better pain control.  Our plan is for hospitalization through Sunday with discharge to [facility] an outpatient facility.  This is being dictated in order to get her orders and better served.  An addendum have to be performed on Sunday.  Laboratory data section is on the chart available for review.  Preop labs were within normal limits.  See for the details.  CONDITION ON DISCHARGE:  Stable.  DISCHARGE MEDS AND PLANS:  The patient being discharged to [facility].  In regards to her left shoulder, she may shower on postoperative day #5.  Dry dressing changes daily as needed until then.  Occupational therapy for range of motion exercises 30 degrees of external rotation, 60 degrees of abduction, 9 degrees of port elevation passively only.  She may allow arm to dangle for pendulums and she may move her elbow, wrist and hand.  She should follow up in our office in 2 weeks.  Call for in time.  She is on a regular diet.  She is on the following medications: 1. Oxycodone CR 20 mg b.i.d. 2. OxyIR 5-10 mg every 3 hours p.r.n. breakthrough pain. 3. Valium 5-10 mg p.o. q. 6 p.r.n. pain and spasm. 4. Celebrex 200 mg daily. 5. Claritin 10 mg daily. 6. Aspirin 81 mg daily. 7. Vitamin D 1000 units b.i.d. 8. Crestor 20 mg daily. 9. Os-Cal calcium supplement along with vitamin D b.i.d. 500 mg. 10.Multivitamin a day. 11.Toprol-XL 25 mg at bedtime. 12.Albuterol 2.5 nebulize situation every 3-4 hours p.r.n. 13.Ventolin inhaler 1-2 puffs q.i.d. p.r.n. 14.At discharge, we will resume her Spiriva and Advair.  Spiriva is 1      cap inhaled every morning and Advair is 1 puff twice daily.  Again condition on discharge stable, improved.     Tracy A. Shuford, P.A.-C.   ______________________________ Vania Rea. Ahmoni Edge, M.D.    TAS/MEDQ  D:  12/06/2010  T:  12/06/2010  Job:  [id number]  Electronically Signed by Ralene Bathe P.A.-C. on 01/06/2011 08:41:16 AM Electronically Signed by Francena Hanly M.D. on 01/20/2011 12:50:29 PM

## 2011-01-21 NOTE — Assessment & Plan Note (Signed)
Ct advair/ spiriva We will involve her in research studies if any for gold stg 3 copd Discussed roflimulast to decrease frequency of exacerbations - will continue discussion depending on course this year

## 2011-01-26 LAB — CARDIAC PANEL(CRET KIN+CKTOT+MB+TROPI)
CK, MB: 2.6 ng/mL (ref 0.3–4.0)
CK, MB: 2.9 ng/mL (ref 0.3–4.0)
Relative Index: INVALID (ref 0.0–2.5)
Relative Index: INVALID (ref 0.0–2.5)
Relative Index: INVALID (ref 0.0–2.5)
Troponin I: 0.05 ng/mL (ref 0.00–0.06)
Troponin I: 0.07 ng/mL — ABNORMAL HIGH (ref 0.00–0.06)

## 2011-01-26 LAB — CBC
HCT: 35.8 % — ABNORMAL LOW (ref 36.0–46.0)
HCT: 40.3 % (ref 36.0–46.0)
Hemoglobin: 12.4 g/dL (ref 12.0–15.0)
MCV: 93.2 fL (ref 78.0–100.0)
Platelets: 186 10*3/uL (ref 150–400)
RDW: 12.7 % (ref 11.5–15.5)
RDW: 12.9 % (ref 11.5–15.5)

## 2011-01-26 LAB — LIPID PANEL
Triglycerides: 64 mg/dL (ref <150)
VLDL: 13 mg/dL (ref 0–40)

## 2011-01-26 LAB — BASIC METABOLIC PANEL
BUN: 18 mg/dL (ref 6–23)
Chloride: 103 mEq/L (ref 96–112)
Chloride: 104 mEq/L (ref 96–112)
GFR calc non Af Amer: 59 mL/min — ABNORMAL LOW (ref >60)
GFR calc non Af Amer: >60 mL/min (ref >60)
Glucose, Bld: 112 mg/dL — ABNORMAL HIGH (ref 70–99)
Glucose, Bld: 128 mg/dL — ABNORMAL HIGH (ref 70–99)
Potassium: 3.9 mEq/L (ref 3.5–5.1)
Potassium: 4.4 mEq/L (ref 3.5–5.1)
Sodium: 136 mEq/L (ref 135–145)

## 2011-01-26 LAB — DIFFERENTIAL
Basophils Absolute: 0 10*3/uL (ref 0.0–0.1)
Basophils Relative: 0 % (ref 0–1)
Eosinophils Absolute: 0.4 10*3/uL (ref 0.0–0.7)
Monocytes Relative: 7 % (ref 3–12)
Neutro Abs: 5.3 10*3/uL (ref 1.7–7.7)
Neutrophils Relative %: 70 % (ref 43–77)

## 2011-01-26 LAB — COMPREHENSIVE METABOLIC PANEL
BUN: 14 mg/dL (ref 6–23)
CO2: 26 mEq/L (ref 19–32)
Chloride: 109 mEq/L (ref 96–112)
Creatinine, Ser: 0.97 mg/dL (ref 0.4–1.2)
GFR calc non Af Amer: 57 mL/min — ABNORMAL LOW (ref >60)
Total Bilirubin: 0.6 mg/dL (ref 0.3–1.2)

## 2011-01-26 LAB — MAGNESIUM: Magnesium: 2.2 mg/dL (ref 1.5–2.5)

## 2011-01-26 LAB — PROTIME-INR
INR: 1 (ref 0.00–1.49)
Prothrombin Time: 13.5 seconds (ref 11.6–15.2)

## 2011-02-06 ENCOUNTER — Telehealth: Payer: Self-pay | Admitting: Pulmonary Disease

## 2011-02-06 NOTE — Telephone Encounter (Signed)
ATC pt to let her know we dont have any smaples at this time. Unable to reach pt at given number and no way to leave a voicemail.

## 2011-02-06 NOTE — Telephone Encounter (Signed)
atc x 1. msg says the [employer] customer is unavailable at this time. Will try to callback in the morning.

## 2011-02-07 NOTE — Telephone Encounter (Signed)
ATC x3 and [employer] message states customer is unavailable at this time and unable to leave a message. Will sign off on note and await return call.

## 2011-03-04 NOTE — H&P (Signed)
[name], [name] NO.:  [id number]   MEDICAL RECORD NO.:  [id number]          PATIENT TYPE:  EMS   LOCATION:  ED                           FACILITY:  [facility]   PHYSICIAN:  Veverly Fells. Excell Seltzer, MD  DATE OF BIRTH:  15-Dec-1940   DATE OF ADMISSION:  04/24/2009  DATE OF DISCHARGE:                              HISTORY & PHYSICAL   PRIMARY CARDIOLOGIST:  Everardo Beals. Juanda Chance, MD, [facility].   PRIMARY CARE PHYSICIAN:  Peyton Najjar, MD.   REASON FOR ADMISSION:  Chest pain.   HISTORY OF PRESENT ILLNESS:  A 70 year old Caucasian female with known  history of CAD, PTCA to the LAD in 1990 x3 in February, June and  December.  At that time also had an MI in 23 and a CVA in 2009, who  recently had a trip to the [place] and began to have  substernal chest pain throbbing on the plane ride to [place].  The patient took multiple nitroglycerin which did not help with the  pain.  Upon arrival to the [place] she spent a few days in  bed and then finally saw a physician there who prescribed a muscle  relaxant and anti-Inflammatory stating that the pain that she is  experiencing he felt was inflammatory.  She did not fill any  prescriptions that he gave her as she had some Flexeril and some  Tramadol with her which she took.  The pain was relived by use of these  medications but when medications wore off had recurrence of pain and  chest discomfort.  She also complained of some shortness of breath, some  lightheadedness, some nausea and some diaphoresis with the pain.  The  patient also used the rescue inhaler which did help some with her  breathing status.  The patient states that the pain was very similar to  the pain she had when she had shingles pain and not similar to the pain  she had related to her coronary artery disease with her pain radiating  to the back and neck at that time.  The patient is currently pain free  in the ER.  After she arrived home  from [place] she called  our office and was not able to get through to see Dr. Juanda Chance and  therefore came here as her daughter is an Charity fundraiser in the emergency room.   REVIEW OF SYSTEMS:  Positive for dull, throbbing pain midsternal with  associated shortness of breath, diaphoresis, chest pain and fatigue.   PAST MEDICAL HISTORY:  1. CVA in 2009 status post TPA.  2. Coronary artery disease.  Myocardial infarction 1990.  PTCA to the      LAD in February, June and December of 1999 per Dr. Juanda Chance.  3. Asthmatic bronchitis.  4. History of gastric ulcer.  5. History of GERD.   PAST SURGICAL HISTORY:  1. Hysterectomy.  2. Bilateral breast implants.  3. Oophorectomy.  4. Appendectomy.  5. ORIF of the right ankle.  6. I and D of abscess to the buttocks.   FAMILY HISTORY:  Diabetes, CAD.  SOCIAL HISTORY:  She is divorced.  She does not smoke.  She works as a  Veterinary surgeon for [employer].   MEDICATIONS:  1. Plavix 75 mg daily.  2. Tramadol 50 mg every 8 hours p.r.n.  3. Toprol XL 25 mg daily.  4. Simvastatin 20 mg daily.  5. Alendronate 70 mg weekly.  6. Xanax 0.25 mg every 8 hours p.r.n.  7. Multivitamin daily.  8. Fish oil daily.  9. Protonix 40 mg daily.   ALLERGIES:  SULFA, CODEINE.   CURRENT LABORATORIES:  1. Troponin 0.06, CK at 111, MB 2.5.  Sodium 142, potassium 3.7,      chloride 109, CO2 was 26, glucose 128, BUN 14, creatinine 0.97.      Bilirubin 0.6, alkaline phosphatase 99.  SGOT 32, SGPT 33.  Total      protein 5.8.  Calcium 8.7.  D-dimer is 0.43.  PT 13.5, INR 1.0.      Hemoglobin 13.5, hematocrit 40.3.  White blood cells 7.6, platelets      186.  2. EKG revealing normal sinus rhythm with nonspecific ST-T wave      changes.  No significant change from previous EKG.  3. Chest x-ray revealing no acute disease.   PHYSICAL EXAMINATION:  Blood pressure 113/36, heart rate 84,  respirations 16.  HEENT:  Head is normocephalic, atraumatic.  Eyes:  PERRLA.   Mucous  membranes pink and moist.  Trachea is midline.  NECK:  Supple without JVD or carotid bruits appreciated.  CARDIOVASCULAR:  Regular rate and rhythm without murmurs, rubs or  gallops.  Breast implants are noted.  There is no reproducible pain on  palpation to the chest or with inspiration.  LUNGS:  Clear to auscultation.  ABDOMEN:  Soft, nontender with 2+ bowel sounds.  EXTREMITIES:  Without clubbing, cyanosis or edema.  Dorsalis pedis 1+  bilaterally.  SKIN:  Warm and dry.  NEURO:  Intact.   IMPRESSION:  1. Atypical chest pain, constant, relieved with muscle relaxants and      Tramadol and returns when medications wear off.  The patient's pain      is atypical for cardiac etiology.  2. Chronic shortness of breath with history of asthma.  3. History of coronary artery disease status post PTCA of the LAD x3      in 1990.  4. History of cerebrovascular accident embolic in 2009 status post      TPA.   PLAN:  This is a 70 year old Caucasian female with known history of  coronary artery disease, myocardial infarction, percutaneous coronary  angioplasty to the left anterior descending x3 and cerebrovascular  accident who has had a recent trip the [place] with  substernal chest pain, described as throbbing ache.  The patient states  that the pain has been constant for 11 days which is relieved with  muscle relaxants and Tramadol.  The pain is also relieved by sitting  upright.  Cardiac markers are negative x1.  Electrocardiogram is  nonspecific.  The patient has suggestive symptoms of pericarditis but  electrocardiogram is non-suggestive.  Therefore, we will admit the  patient for observation overnight.  Rule out myocardial infarction with  serial markers.  Do a two-dimensional echocardiogram to evaluate for  pericardial effusion.      Bettey Mare. Lyman Bishop, NP      Veverly Fells. Excell Seltzer, MD  Electronically Signed    KML/MEDQ  D:  04/24/2009  T:  04/24/2009  Job:   [id number]   cc:  Peyton Najjar, MD

## 2011-03-04 NOTE — Cardiovascular Report (Signed)
NAMEARABELA, [name]              ACCOUNT NO.:  [id number]   MEDICAL RECORD NO.:  [id number]          PATIENT TYPE:  AMB   LOCATION:  ENDO                         FACILITY:  [facility]   PHYSICIAN:  Peter C. [name], MD, FACCDATE OF BIRTH:  04-04-1941   DATE OF PROCEDURE:  DATE OF DISCHARGE:                            CARDIAC CATHETERIZATION   Ms. [name] is a 70 year old patient who was recently hospitalized from  April 21, 2008 to April 22, 2008, for a TIA.  She subsequently had a  followup echocardiogram transthoracically on April 24, 2008.  Dr. Myrtis Ser  read this study.  There was anterior apical akinesis with a probable  apical clot.  The patient had been discharged from the hospital on  aspirin and Plavix.  TEE was scheduled by Dr. Pearlean Brownie.   The patient was sedated with 5 mg of Versed and 50 mcg of fentanyl.   Using digital technique, an OmniPlane probe was advanced in distal  esophagus without incident.   Transgastric imaging showed an EF of 45% with distal septal and apical  akinesis.  The septum and apex were not as well visualized as on  transthoracic imaging due to a far-field nature of the imaging.  The  patient definitely had a left ventricular apical band.  There was a  possibility of mural apical thrombus, but this was difficult to tell.  The transthoracic images actually visualized the area better.  The  mitral valve was mildly thickened with mild-to-moderate MR.  Aortic  valve was trileaflet and sclerotic.  There was trivial AR.  There was no  atrial septal defect.  There was mild left atrial enlargement.  Right-  sided cardiac chambers were normal.  There was no left atrial appendage  clot.  There was no PFO or ASD.  Bubble study was negative for right-to-  left shunt.  There was no significant aortic debris.   FINAL IMPRESSION:  1. Poorly visualized apex by transesophageal echocardiogram.  I did      review the patient's transthoracic echo which is suspicious for      mural  apical clot.  The patient will be started on Coumadin 5 mg a      day.  She will be seen in our Coumadin Clinic today and have a      followup INR on Monday.  She already has a followup appointment      with Dr. Juanda Chance.  We will tentatively schedule a cardiac MRI to      definitively diagnose clot in the apex.  Clinically, this would      appear to be reasonable source of embolus given her wall motion      abnormality and recent transient ischemic attack.  We will stop her      Plavix and put her on a baby aspirin given her coronary artery      disease.  2. Ejection fraction 45%.  Regional wall motion abnormality, known      coronary artery disease.  3. Mild-to-moderate mitral regurgitation.  4. Negative bubble study.  5. No patent foramen ovale.  6. No left atrial appendage  clot.  7. No aortic debris.   Considerable time was spent with the patient reviewing her past history,  reviewing her transthoracic echocardiogram, performing her TEE, and  arranging follow-up in the Coumadin Clinic and with Dr. Juanda Chance and for  cardiac MRI.      [name]. [name], MD, [facility]  Electronically Signed     PCN/MEDQ  D:  06/07/2008  T:  06/07/2008  Job:  [id number]   cc:   Pramod P. Pearlean Brownie, MD

## 2011-03-04 NOTE — Discharge Summary (Signed)
[name], [name] NO.:  [id number]   MEDICAL RECORD NO.:  [id number]          PATIENT TYPE:  INP   LOCATION:  2029                         FACILITY:  [facility]   PHYSICIAN:  Jesse Sans. Wall, MD, FACCDATE OF BIRTH:  [birth year]   DATE OF ADMISSION:  04/26/2009  DATE OF DISCHARGE:  04/28/2009                               DISCHARGE SUMMARY   PRIMARY CARDIOLOGIST:  Everardo Beals. Juanda Chance, MD, [facility]   DISCHARGE DIAGNOSES:  1. Coronary artery disease progression.      a.     Status post drug-eluting stenting of mid circumflex artery       stenosis.      b.     Ejection fraction 35-40%; multiple wall motion       abnormalities.  2. Relative hypotension.  3. Exertional dyspnea.      a.     Documented desaturation.   SECONDARY DIAGNOSES:  1. Coronary artery disease.      a.     Status post myocardial function in 1990.      b.     Status post multiple percutaneous interventions of left       anterior descending in 1999.  2. Status post stroke in 2009.      a.     Treated with tPA.  3. Dyslipidemia.  4. Gastroesophageal reflux disease.   REASON FOR ADMISSION:  [name] is a 70 year old female, with known  coronary artery disease, who presented with chest pain, felt to be  atypical for ischemia.  She also presented with chronic shortness of  breath, and was admitted for further evaluation and rule out myocardial  infarction.   HOSPITAL COURSE:  Serial cardiac markers were drawn and notable for  nondiagnostic troponins with a peak elevation of 0.07, with normal MBs.  A D-dimer was also drawn, and normal.  Initial concern was for possible  pericarditis, but 2-D echocardiography was negative for evidence of an  effusion.  Venous duplex imaging of both legs was also done, and also  negative for evidence of DVT.   Given the patient's persistent complaint of chest pain, however, Dr.  Riley Kill suggested proceeding with a cardiac catheterization.  This was  thus performed  later that same day, by Dr. Clifton James, and which yielded  severe single-vessel CAD with a 99% stenosis in the mid circumflex  artery.  Residual anatomy was nonobstructive.  LV function was  moderately depressed (EF of 35-40%), with  anteroapical/apical/inferoapical hypokinesis.  Dr. Clifton James proceeded  with successful percutaneous intervention, placing a Xience drug-eluting  stent to the mid circumflex artery, with no noted complications.  The  patient is to remain on concomitant aspirin/Plavix for at least 1 year.   Postop course notable for development of exertional dyspnea.  Ambulatory  sats were obtained, dropping as low as 88%.  Of note, the patient does  have history of asthma and was treated with nebulizer.   The patient had also been started on lisinopril 2.5 mg.  Given the  development of her relative hypotension, however, with systolics in the  90-95 range, we stopped her lisinopril  in the morning of discharge, and  advised that she not resume this.  We also held her Toprol that same  day, and I suggested that she resume it the day following discharge.  Her blood pressure at the time of discharge had improved to 111/63.  Her  pulse was in the 90s.   Dr. Daleen Squibb also recommended that the patient be referred to one of our  pulmonologist, and recommended Dr. Cyril Mourning.  Arrangements will be  made through our office, following discharge.   DISPOSITION:  At the time of discharge:  Stable.   DISCHARGE LABORATORY DATA:  None.   OUTSTANDING LABORATORY DATA:  Normal total CPKs with a peak troponin I  of 0.07.  Total cholesterol 113, triglyceride 64, HDL 43, and LDL 57.  Electrolytes and renal function remained stable.  CBC normal on  admission.  D-dimer is 0.43.   DISCHARGE MEDICATIONS:  1. Plavix 150 mg daily (x1 week), then 75 mg daily.  2. Coated aspirin 325 mg daily.  3. Simvastatin 20 mg nightly.  4. Toprol-XL 25 mg daily.  5. Nitrostat 0.4 mg p.r.n.  6. Nebulizer, as  previously directed.   INSTRUCTIONS:  1. The patient is to follow up with Dr. Charlies Constable in 2 weeks.      Arrangements to be made through our office.  2. Recommendations for the patient to be evaluated by Pulmonology.  At      the time of discharge, she expressed the desire to confirm with Dr.      Juanda Chance as to a particular pulmonologist.  3. The patient is advised to contact our office in the event of any      swelling/bleeding of her groin incision site.   Discharge encounter, including physician time, greater than 30 minutes.      Gene Serpe, PA-C      Thomas C. Daleen Squibb, MD, [facility]  Electronically Signed    GS/MEDQ  D:  04/28/2009  T:  04/29/2009  Job:  [id number]   cc:   Peyton Najjar, MD

## 2011-03-04 NOTE — Cardiovascular Report (Signed)
NAMEMAYSON, STERBENZ NO.:  [id number]   MEDICAL RECORD NO.:  [id number]          PATIENT TYPE:  INP   LOCATION:  2502                         FACILITY:  [facility]   PHYSICIAN:  [name], MDDATE OF BIRTH:  [birth year]   DATE OF PROCEDURE:  04/26/2009  DATE OF DISCHARGE:                            CARDIAC CATHETERIZATION   PRIMARY CARDIOLOGIST:  Everardo Beals. Juanda Chance, MD, [facility]   PROCEDURES PERFORMED:  1. Left heart catheterization.  2. Selective coronary angiography.  3. Left ventricular angiogram.  4. Percutaneous coronary intervention with placement of a XIENCE drug-      eluting stent in the mid circumflex coronary artery.   OPERATOR:  [name], MD   INDICATION:  Chest pain and segmental left ventricular dysfunction.   PROCEDURE IN DETAIL:  The patient was brought to the Main  Catheterization Laboratory at [facility] for planned diagnostic  left heart catheterization.  The right groin was prepped and draped in  the sterile fashion.  Lidocaine 1% was used for local anesthesia.  A 5-  French sheath was inserted into the right femoral artery without  difficulty.  Standard diagnostic catheters were used to perform  selective coronary angiography.  A pigtail catheter was used to perform  a left ventricular angiogram.   At this point of the case, we elected to proceed intervention of the mid  circumflex coronary artery.  The patient had a 99% stenosis in the mid  circumflex artery.  The patient was given a bolus of Angiomax and a drip  was started.  The 5-French sheath was upsized to a 6-French sheath.  Once the ACT was greater than 200, a Cougar intracoronary wire was  passed down the circumflex artery into the first obtuse marginal branch.  A 2.5 x 12-mm balloon was used for predilatation.  A 2.5 x 15-mm XIENCE  drug-eluting stent was then deployed in the mid circumflex artery.  A  2.75 x 12-mm noncompliant balloon was inflated to 18  atmospheres inside  the stent.  This gave [place] a diameter of 2.84 mm.  There was TIMI-III flow  both before and after the intervention.  There was an excellent  angiographic result.  The stenosis was 99% prior to the intervention of  0% following the intervention.  The patient tolerated the procedure well  and was taken to the holding area in stable condition.   HEMODYNAMIC FINDINGS:  Central aortic pressure 110/63.  Left ventricular  pressure 140/12, left ventricular end-diastolic pressure 21.   ANGIOGRAPHIC FINDINGS:  1. The left main coronary artery had no evidence of obstructive      disease.  2. The left anterior descending is a large vessel that courses to the      apex and gives off a moderate-sized diagonal branch.  There was      mild nonobstructive plaque in the midportion of this vessel.  There      were no severely obstructive lesions noted in this vessel.  The      diagonal branch is a moderate-sized vessel that has a 30% stenosis.  3. The circumflex artery is  a moderate-to-large size vessel that gives      off a moderate-sized obtuse marginal branch.  There was a 99%      stenosis in the midportion of the circumflex just prior to the take      off of the obtuse marginal branch.  There was no other obstructive      disease noted in this system.  4. The right coronary artery is a large dominant vessel that has      serial 30% lesions throughout the proximal and midportion.  There      was some calcification noted in the proximal vessel.  5. Left ventricular angiogram was performed in the RAO projection that      shows anteroapical, apical, and inferoapical hypokinesis.  Ejection      fraction is estimated at 35-40%.  There was no significant gradient      noted across the aortic valve with catheter pullback.   IMPRESSION:  1. Single-vessel coronary artery disease.  2. Segmental left ventricular dysfunction.  3. Successful percutaneous coronary intervention with placement  of a      XIENCE drug-eluting stent in the mid circumflex coronary artery.   RECOMMENDATIONS:  The patient should be continued on aspirin and Plavix  for at least 1 year.  I will continue her beta-blocker and statin as  currently written.  I will discontinue her Protonix because of the  potential interaction with Plavix.      [name], MD  Electronically Signed     CM/MEDQ  D:  04/26/2009  T:  04/27/2009  Job:  [id number]   cc:   Everardo Beals. Juanda Chance, MD, [facility]

## 2011-03-04 NOTE — Discharge Summary (Signed)
[name], [name] NO.:  [id number]   MEDICAL RECORD NO.:  [id number]          PATIENT TYPE:  INP   LOCATION:  2029                         FACILITY:  [facility]   PHYSICIAN:  Jesse Sans. Wall, MD, FACCDATE OF BIRTH:  03/29/1941   DATE OF ADMISSION:  04/26/2009  DATE OF DISCHARGE:  04/28/2009                               DISCHARGE SUMMARY   ADDENDUM   INSTRUCTIONS:  The patient has been advised to not resume her Toprol  until the following morning.      Gene Serpe, PA-C      Thomas C. Daleen Squibb, MD, [facility]  Electronically Signed    GS/MEDQ  D:  04/28/2009  T:  04/29/2009  Job:  [id number]

## 2011-03-04 NOTE — Op Note (Signed)
[name], [name]              ACCOUNT NO.:  [id number]   MEDICAL RECORD NO.:  [id number]          PATIENT TYPE:  AMB   LOCATION:  DAY                          FACILITY:  [facility]   PHYSICIAN:  Sharlet Salina T. Hoxworth, M.D.DATE OF BIRTH:  1941/07/14   DATE OF PROCEDURE:  06/11/2007  DATE OF DISCHARGE:                               OPERATIVE REPORT   PRE AND POSTOPERATIVE DIAGNOSIS:  Abscess left buttock.   SURGICAL PROCEDURES:  Incision and drainage and debridement, skin and  subcutaneous tissue, abscess left buttock.   SURGEON:  Lorne Skeens. Hoxworth, M.D.   ANESTHESIA:  General   BRIEF HISTORY:  Ms. Kisling is a 70 year old female with a history of  MRSA infection, apparently following orthopedic procedure.  She now  presents with a several-day history of increasing redness and pain and  now drainage to the left buttock.  Examination has revealed about a 10  cm area of erythema with a central ulcerated draining skin lesion 2 cm  in diameter in the center of this consistent with deep subcutaneous  abscess, possibly MRSA.  We recommended proceeding with excision  incision and drainage and debridement in the operating room under  general anesthesia.  The nature of the procedure, indications, risks of  bleeding, infection were discussed understood.  She is now brought to  the operating room for this procedure.   DESCRIPTION OF OPERATION:  The patient brought to the operating room and  placed in supine position on the table and general anesthesia was  induced.  She is carefully positioned in lithotomy position.  The  buttocks sterilely prepped and draped.  There was a 2 cm area of skin  necrosis and central drainage and this was excised back to healthy skin  and necrotic skin and subcu sharply debrided back to healthy tissue.  This communicated a little deeper to another pocket total of about 2 cm  deep into the subcu.  This loculation was all broken up all unhealthy  tissue excised.   Hemostasis obtained with cautery.  The soft tissue was  infiltrated with Marcaine.  Hemostasis assured.  Moist saline gauze  packing was placed.  The patient taken to recovery room in good  condition.      Lorne Skeens. Hoxworth, M.D.  Electronically Signed     BTH/MEDQ  D:  06/11/2007  T:  06/13/2007  Job:  [id number]

## 2011-03-04 NOTE — Discharge Summary (Signed)
[name], [name]              ACCOUNT NO.:  [id number]   MEDICAL RECORD NO.:  [id number]          PATIENT TYPE:  INP   LOCATION:  1442                         FACILITY:  [facility]   PHYSICIAN:  Rosalyn Gess. Norins, MD  DATE OF BIRTH:  19-Feb-1941   DATE OF ADMISSION:  08/21/2008  DATE OF DISCHARGE:  08/25/2008                               DISCHARGE SUMMARY   ADMISSION DIAGNOSES:  1. Sigmoid diverticulitis.  2. Constipation   DISCHARGE DIAGNOSES:  1. Sigmoid diverticulitis.  2. Constipation   CONSULTANTS:  Dr. Yancey Flemings for GI.   PROCEDURES:  1. Acute abdomen series, which revealed no evidence of bowel      obstruction or free peritoneal air.  2. CT of the abdomen with contrast read out as no acute abdominal      findings. Stool throughout the colon suggests constipation.  CT of      the pelvis was included, which revealed probable sigmoid      diverticulitis with no evidence of pelvic abscess.  3. Two views of the chest November 3 with no active cardiopulmonary      disease.  4. Upper endoscopy performed August 24, 2008 with submucosal duodenal      polyp and Candida esophagitis.   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  She has history  of CAD with MI in 35.  The patient presented to the Emergency  Department with a 4-day history of worsening diffuse abdominal pain, non-  radiating in nature, which she thought were similar to labor pains.  The  patient had 4 days of constipation prior to her admission.  States she  has not had a good bowel movement and 10 days.  She also had generalized  weakness..  She had no fever, sweats or chills.  She had no  hematochezia.  She had no focal symptoms.  The patient reports her  abdominal pain became progressively worse and she presented to the  Emergency Department.  Laboratory revealed a white count 12,900, normal  hematocrit and hemoglobin.   Electrolytes were stable, renal function was  normal with a creatinine 0.78.  Liver functions were normal, lipase was  normal at 21.  CT of the abdomen, as above, revealed possible  diverticulitis.  The patient was subsequently admitted to hospital.   HOSPITAL COURSE:  1. The patient was treated with IV ciprofloxacin.  She was given      MiraLax to help with bowel movements.  She was seen in consultation      by the GI Service.  Because of heme-positive stools, she had upper      endoscopy which did reveal candida esophagitis.  She had 4 mm non-      bleeding ulcer in the antrum and she had a polyp noted in the      duodenum.  It was recommended the patient be treated with Diflucan      and continue on proton pump inhibitor.  The patient did improve in  regards to abdominal pain and discomfort.  She remained afebrile.      With the patient being fully evaluated, endoscopy being completed      with no signs of active bleeding, with temperature control, she was      thought to be stable and ready for discharge home to complete an      antibiotic course.  2. Cardiovascular: The patient was monitored and had no change on      telemetry.  EKG was unremarkable.  Cardiac panel November 3 was      negative with a CK-MB of 2.9, troponin less than 0.01, second      cardiac panel also was negative with troponin less than 0.01.      Initial laboratory summary included a lipase that was normal at 21.      Urinalysis, which was negative.  Amylase, which was mildly elevated      at 154.  Final laboratory November 5 with a hemoglobin 11.7 grams,      white count 7200, platelet count 183,000, anemia panel, which      revealed normal reticulocyte count 1.5%, iron was 81 in normal      range.  Iron percent saturation was 40% normal range.  B12 was      normal and 909.   DISCHARGE EXAMINATION:  VITAL SIGNS:  Temperature of 976, blood pressure  120/55, heart rate 67, respirations 21, O2 sats 95% on  room air.  GENERAL APPEARANCE:  Pleasant woman lying in bed who is in no acute  distress.  CHEST:  Patient is moving air well.  No rales, wheezes or rhonchi.  CARDIOVASCULAR: Patient had 2+ radial pulse with a regular rate and  rhythm.  ABDOMEN: The patient had bowel sounds in all four quadrants.  She had no  guarding.  She did have tenderness to palpation in both lower quadrants.   DISPOSITION:  The patient is discharged home.   MEDICATIONS:  She will resume her home medications including:  1. Advair Diskus 250/50. a  2. Albuterol inhaler 2 puffs 4 times daily as needed.  3. Toprol XL 25 mg daily.  4. Plavix 75 mg daily.  5. Simvastatin 20 mg daily.  6. Alendronate 70 mg weekly.  7. Indomethacin 50 mg at bedtime, which should be held until seen in      follow-up by Dr. [name].  8. Multivitamin.  9. Omega 3 fish oil.  10.Calcium with vitamin D twice a day.  11.Vitamin B complex.  12.Glucosamine.  In addition she will take:  1. Protonix 40 mg daily until seen in follow-up.  2. Cipro 500 mg b.i.d. for 10 days.  3. Flagyl 500 mg t.i.d. for 10 days.  4. Diflucan 100 mg daily for an additional 4 days.  The patient will also take her over-the-counter supplements including  cranberry pill, garlic pill, Coenzyme Q10,  body purifier and colon  cleanser.   DISCHARGE INSTRUCTIONS:  The patient is instructed to call Dr. Frederik Pear  office for a follow-up appointment in 7-10 days.   DISCHARGE CONDITION:  The patient's condition at time of discharge  dictation is stable and improved.      Rosalyn Gess Norins, MD  Electronically Signed     MEN/MEDQ  D:  08/25/2008  T:  08/25/2008  Job:  [id number]   cc:   [name]. [name], MD,FACP,FCCP  [phone] [place]  [place] [address]   Wilhemina Bonito. Marina Goodell, MD  [address]  [place]  [place] [address]

## 2011-03-04 NOTE — H&P (Signed)
NAMEKYLEIGH, NANNINI              ACCOUNT NO.:  [id number]   MEDICAL RECORD NO.:  [id number]          PATIENT TYPE:  OBV   LOCATION:  1409                         FACILITY:  [facility]   PHYSICIAN:  Veverly Fells. Excell Seltzer, MD  DATE OF BIRTH:  [birth year]   DATE OF ADMISSION:  04/24/2009  DATE OF DISCHARGE:                              HISTORY & PHYSICAL   ADDENDUM:   JOB #:  [id number].   I neglected to place under review of systems - all systems had been  reviewed and were found to be negative with the exception of those  mentioned.      Bettey Mare. Lyman Bishop, NP      Veverly Fells. Excell Seltzer, MD  Electronically Signed    KML/MEDQ  D:  04/25/2009  T:  04/26/2009  Job:  [id number]

## 2011-03-04 NOTE — Assessment & Plan Note (Signed)
[facility]                            CARDIOLOGY OFFICE NOTE   [name], [name]                     MRN:          [id number]  DATE:06/14/2008                            DOB:          January 24, 1941    PRIMARY CARE PHYSICIAN:  [name]. [name], MD, FACP, FCCP   REFERRING PHYSICIAN:  Pramod P. Pearlean Brownie, MD   REASON FOR REFERRAL:  Evaluation of possible left ventricular thrombus  and symptoms of fatigue.   CLINICAL HISTORY:  [name] was referred for evaluation of a  possible left ventricular thrombus to help decide in management of her  anticoagulation and also cardiac evaluation for symptoms of fatigue.  [name] is well known to me.  She is a Customer service manager.  She had  an anterior wall myocardial infarction in 1992 with tPA and subsequent  PTCA of the LAD.  She last had an adenosine Myoview scan performed in  May 2008, which showed an ejection fraction of 57% and apical akinesis,  but no definite ischemia.   She recently was admitted to [facility] and then [facility] with  sudden onset of visual loss and headache and she underwent treatment  with tPA after having a CT scan, which showed no intracranial  hemorrhage.  She subsequently had an MR scan, which showed a right  occipital stroke and an MRA, which showed occlusion of the posterior  cerebral artery supplying that area.  She had a transthoracic echo,  which suggested LV thrombus and she subsequently had a transesophageal  echo by Dr. [name], which was not definitive.  Dr. [name] subsequently  did an MR of the heart, which showed an area of apical akinesis, but no  thrombus.  Ejection fraction by MR was 41%.   She has had symptoms of fatigue and giving out easily, and shortness of  breath for a number of months.  She had no associated chest pain.   [place]  was given extra blood pressure medicine, but these  symptoms have  resolved.   Her past medical history is significant for hyperlipidemia, asthmatic  bronchitis, bilateral breast implants, and previous hysterectomy and  oophorectomy.  Her recent lipid profile in the hospital was quite good.   Her current medications include Indocin, simvastatin 40 mg daily,  meloxicam, Ventolin p.r.n., Coumadin, Glucosamine, fish oil, calcium,  and vitamins.   SOCIAL HISTORY:  The patient is divorced.  She works as a Pharmacist, hospital.  Her daughter is an emergency room nurse at [employer] and was  with her today.  She is a previous smoker, but not recently.   FAMILY HISTORY:  Significant for heart disease with a father who died at  age 59 of an MI.   REVIEW OF SYSTEMS:  Positive for symptoms of fatigue and headache.   PHYSICAL EXAMINATION:  VITAL SIGNS:  Today, her blood pressure was  126/70, and the pulse 76 and regular.  NECK:  There was no venous distention.  The carotid pulses were full.  There were  no bruits.  CHEST:  Clear without rales or rhonchi.  CARDIAC:  Rhythm was regular.  I could hear no murmurs or gallops.  ABDOMEN:  Soft with normal bowel sounds.  There was no  hepatosplenomegaly.  EXTREMITIES:  Peripheral pulses were full.  There was some varicosities  and trace peripheral edema.  MUSCULOSKELETAL:  Showed no deformities.  SKIN:  Warm and dry.  NEUROLOGIC:  Showed resolution of hemianopsia.  No focal neurological  signs.   IMPRESSION:  1. Recent occipital stroke with hemianopsia treated with tissue      plasminogen activator with resolution of symptoms.  2. Initial question of mural thrombus by transthoracic      echocardiography, but no evidence of mural thrombus by MRI.  3. Coronary artery disease, status post remote anterior wall      myocardial infarction in 1992 with tissue plasminogen activator and      subsequent percutaneous transluminal coronary angioplasty.  4. Exertional fatigue, rule out ischemia.  5. Ejection  fraction of 41-45%.  6. Hyperlipidemia.  7. Asthmatic bronchitis.   RECOMMENDATIONS:  Based on the MR, we do not believe that [name]  has a left ventricular thrombus and I do not think she needs to continue  the Coumadin.  I discussed with Dr. Pearlean Brownie, the optimum antithrombotic  treatment for her recent stroke and he recommended Plavix alone and we  will plan to do that.  We think she has a cardiac indication to add  Aspirin, later we will do that.  Her exertional fatigue could be an  anginal equivalent.  We will plan to evaluate with an adenosine rest  stress Myoview scan.  The last scan that she had was in May 2008.  If  her scan is nonischemic, then we will continue her current therapy.  I  will plan to see her back in followup in a year.  We will plan to add  the Coreg to her current medications because of her LV dysfunction, but  we will wait until after her Myoview scan.      Bruce Elvera Lennox Juanda Chance, MD, [facility]  Electronically Signed    BRB/MedQ  DD: 06/14/2008  DT: 06/15/2008  Job #: [id number]

## 2011-03-04 NOTE — H&P (Signed)
NAME:  TAMIYAH, MOULIN NO.:  [id number]   MEDICAL RECORD NO.:  [id number]          PATIENT TYPE:  EMS   LOCATION:  ED                           FACILITY:  [facility]   PHYSICIAN:  Gustavus Messing. Orlin Hilding, M.D.DATE OF BIRTH:  04-Jan-1941   DATE OF ADMISSION:  04/20/2008  DATE OF DISCHARGE:  04/20/2008                              HISTORY & PHYSICAL   CHIEF COMPLAINT:  Vision loss to left.   HISTORY OF PRESENT ILLNESS:  Wynona Duhamel is a 70 year old right-  handed white woman with a previous history of coronary artery disease  and asthma.  She was working Quarry manager as a Veterinary surgeon when she had sudden  onset of loss of vision to the left, describing the left homonymous  hemianopsia.  She has had 3 days of some headache and some atypical  chest pain for her.  She has had coronary artery disease and an MRI and  this was not typical of that kind of pain for her.  She otherwise had no  symptoms of weakness or language disturbance.  She was taken to ER by a  private car.  The CT was negative.   REVIEW OF SYSTEMS:  Twelve-system reviewed.  She complains only of the  vision symptoms, headaches, and heavy burning sensation.  She does have  occasional stress incontinence and occasional shortness of breath  associated with her asthma.  Otherwise, complete review of systems is  negative.   PAST MEDICAL HISTORY:  Significant for coronary artery disease with a  remote MI, asthma, and ORIF in the past to the right ankle.  She has had  MRSA cellulitis at that site and also had a right buttock MRSA lesion.  She had a minor bladder procedure done in May 2009.   MEDICATIONS:  List is incomplete as are doses.  She takes;  1. Advair 250/50 mg twice a day.  2. Nitroglycerin p.r.n.  3. Lipitor 10 mg 1 a day.  4. Meloxicam 15 mg twice a day.  5. Multivitamin once a day.  6. Calcium and vitamin D supplements.  7. Aspirin 81 mg a day.  There may be others.   ALLERGIES:  SULFA and CODEINE.   SOCIAL HISTORY:  She is divorced and has 3 children.  She works as a  Veterinary surgeon.  She is a remote smoker.   FAMILY HISTORY:  Positive for diabetes and coronary artery disease.   OBJECTIVE:  VITAL SIGNS:  Pulse is 78, blood pressure 116/43,  respirations 18, and 95% sat on room air.  HEENT:  Head is normocephalic and atraumatic.  NECK:  Supple without bruits.  HEART:  Regular rate and rhythm.  LUNGS:  Clear to auscultation.  ABDOMEN:  Obese, but nontender and nondistended.  EXTREMITIES:  Without edema.  NEUROLOGIC:  She is alert.  She answers 2/2 questions correctly.  Follows 2/2 commands correctly.  She has normal horizontal gaze.  She  has a left homonymous upper quadrant hemianopsia.  No facial droop is  noted.  There is no drift in either upper or lower extremities.  There  is no ataxia.  She has  normal sensation.  There is no aphasia.  No  dysarthria.  No neglect.  She scores 1 on the stroke scale.   RADIOLOGY:  CT of the brain was negative for any acute stroke.  There is  a potential stroke that is small in hypodensity in the left cerebellar  hemisphere, which was questionable.   LABORATORY DATA:  She did have cardiac markers done.  Myoglobin was 61.9  and troponin was less than 0.05.  CK-MB was less than 1.0.  Stroke  panel, white blood cell count 8.6, hemoglobin 13.8, hematocrit 40.3, and  platelets 168.  Protime 13.1 and INR is 1.0, still pending the  chemistries on that.  She has no history of any blood glucose problems.   IMPRESSION:  Suspect right occipital stroke, although, the symptoms are  minimal.  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  Ordinarily, I would not do it for  such a low NIH score; however, I am going to give more weight to this  deficit since it will have a significant impact on her livelihood and  quality of life.   PLAN:  We discussed the pros and cons in detail including significant   increased risk of bleeding using the tPA versus not using the tPA. Also  the fact that tPA may not cause any improvement.  She understands,  however, that the long-term outcome may be better if we use a tPA than  if we do not.  She will need to be transferred to [facility] ICU.  Following the infusion of the tPA, she will need to have an extensive  workup to look for source of the stroke including MRI, MRA, carotid  Doppler, 2D echo, homocystine levels, fasting lipids, and hemoglobin  A1c.      Catherine A. Orlin Hilding, M.D.  Electronically Signed     CAW/MEDQ  D:  04/20/2008  T:  04/21/2008  Job:  [id number]

## 2011-03-04 NOTE — Discharge Summary (Signed)
NAME:  [name], [name] NO.:  [id number]   MEDICAL RECORD NO.:  [id number]          PATIENT TYPE:  INP   LOCATION:  3025                         FACILITY:  [facility]   PHYSICIAN:  Levert Feinstein, MD          DATE OF BIRTH:  12-19-1940   DATE OF ADMISSION:  04/21/2008  DATE OF DISCHARGE:  04/22/2008                               DISCHARGE SUMMARY   HISTORY OF PRESENT ILLNESS:  The patient is a 70 year old right-handed  female with previous history of asthma and coronary artery disease, work  as a Veterinary surgeon.  She has developed sudden onset of left hemianopia, total  black on her left visual field while working with her custom, on April 20, 2008, presenting to the ER within 3 hours window, and has received IV t-  PA, she reported after IV t-PA infusion,  the left hemianopia went from  total black to cloudy, and continued to improve in the next 24 hours.   Currently, she stated that her left visual field was a little bit  cloudy, color is less bright, but she can see fine otherwise. She denied  lateralized motor or sensory deficit.  She has a mild shortness of  breath due to her longstanding asthma.   An MRI of the brain has demonstrated acute stroke involving the right  occipital, optic radiation, and MRA of the vessel does not demonstrate  any significant stenosis, echocardiogram is still pending.   Lab evaluation has demonstrated LDL of 82, was otherwise normal.   1. She was discharged home.  I advised no driving for 1 week, till her      become more adapted to the visual field change.  2. She will return to [facility] on Monday, April 24, 2008 at      7:30 a.m. for echocardiogram to complete stroke workup.  3. She was taking aspirin 81 mg prior to admission.  We will switch      her to Plavix 75 mg every day, otherwise keep her home medications.  4. Follow up with Dr. Pearlean Brownie in 1 month.      Levert Feinstein, MD  Electronically Signed     YY/MEDQ  D:  04/22/2008  T:   04/22/2008  Job:  [id number]

## 2011-03-04 NOTE — Assessment & Plan Note (Signed)
[facility]                            CARDIOLOGY OFFICE NOTE   Jamie, LABRAKE                     MRN:          [id number]  DATE:02/23/2007                            DOB:          02-13-1941    PRIMARY CARE PHYSICIAN:  Dr. Marga [name].   CLINICAL HISTORY:  [name] returns for followup management of her  coronary heart disease and new symptoms of fatigue. She had an anterior  wall myocardial infarction in 1990 treated with TPA and subsequent PVC  of the LAD. She has done quite well since that time. However over the  last 3 months, she has had symptoms of increased fatigue. She said she  does not feel like doing anything and when she gets home from work she  just feels like going to bed. She is working in Research officer, political party. She had a  bad accident in March of last year where she broke some bones in her  legs and was disabled for a prolonged period of time but she feels like  she completely recovered from that and then she has had a new setback  over these last three months. She has not had any chest pain and only  occasional palpitations.   PAST MEDICAL HISTORY:  Significant for hyperlipidemia, asthmatic  bronchitis, bilateral breast implants and a previous hysterectomy and  oophorectomy.   CURRENT MEDICATIONS:  Lipitor, vitamins, fish oil, glucosamine, aspirin,  Fosamax, Celebrex, Advair.   PHYSICAL EXAMINATION:  VITAL SIGNS:  Her blood pressure is 102/64 and  the pulse 89 and regular.  NECK:  There was no vein distention. Carotid pulses were full without  bruits.  CHEST:  Clear.  HEART:  Rhythm was regular. I could hear no murmurs or gallops.  ABDOMEN:  Soft with normal bowel sounds. There was no  hepatosplenomegaly.  EXTREMITIES:  Peripheral pulses were full and there was no peripheral  edema.   An ECG showed sinus rhythm and Q waves in V1 and V2 consistent with an  old septal infarct.   IMPRESSION:  1. Coronary artery disease  status post anterior wall myocardial      infarction in 1990 treated with TPA and subsequent PTCA of the left      anterior descending now stable. Symptoms of fatigue of uncertain      etiology, rule out ischemia.  2. Hyperlipidemia.  3. Asthmatic bronchitis.   RECOMMENDATIONS:  I am not certain regarding the etiology of Ms.  [name]'s fatigue. We will plan to get some laboratory studies including  a CBC, BMP, BNP, TSH. She questioned rather this might be related to  hypothyroidism. Will also get a rest stress adenosine Myoview scan. We  will be in touch with her by phone after we have the results and decide  if we need any further  therapy. If all of her studies are negative and we decide she does not  need any further cardiac evaluation, we will plan to have her come back  in 2 years.     Bruce Elvera Lennox Juanda Chance, MD, [facility]  Electronically Signed    BRB/MedQ  DD:  02/23/2007  DT: 02/23/2007  Job #: [id number]

## 2011-03-04 NOTE — H&P (Signed)
[name] NO.:  [id number]   MEDICAL RECORD NO.:  [id number]          PATIENT TYPE:  INP   LOCATION:  1442                         FACILITY:  [facility] [place]   PHYSICIAN:  Ramiro Harvest, MD    DATE OF BIRTH:  13-Jul-1941   DATE OF ADMISSION:  08/21/2008  DATE OF DISCHARGE:                              HISTORY & PHYSICAL   PRIMARY CARE PHYSICIAN:  Dr. [name] of [facility].   CARDIOLOGIST:  Dr. Juanda Chance of [facility].   NEUROLOGIST:  Dr. Pearlean Brownie.   HISTORY OF PRESENT ILLNESS:  [name] is a 70 year old white  female with a history of a right occipital CVA status post TPA on April 20, 2008, history of coronary artery disease status post MI in 1990,  history of asthmatic bronchitis, diverticulosis who presents to the ED  with a 4-day history of worsening diffuse abdominal pain, nonradiating  in nature and described as labor pains.  The patient states that four  days prior to admission, developed some bloating, some diffuse abdominal  pain and some constipation.  The patient states has not had a bowel  movement in 1-1/2 weeks.  The patient stated that she tried some enemas  and stool softeners with no relief.  The patient's abdominal pain has  been worsening on a daily basis with some associated emesis x2 and  nausea on the day of admission, starting at her neurologist's office  when she went for follow-up visit.  The patient also endorses some  generalized weakness.  The patient denies any fever no chills, no chest  pain, no change in her chronic shortness of breath, no cough, no melena  or hematemesis.  No hematochezia.  No focal neurological symptoms.  The  patient called the PCP office on the day of admission and could not be  seen until August 22, 2008.  The patient states that her abdominal pain  was just worsened and thus presented to the ED.  In the ED, labs  obtained showed a CBC with a white count of 12.9, hemoglobin of 14.5,  platelets of 204, hematocrit of 43.8, ANC of 11.1, sodium 139, potassium  4.0, chloride 106, bicarb 26, BUN 24, creatinine 0.78, glucose of 115,  bilirubin of 0.9, alk phosphatase 98, AST 23, ALT 24, protein 6.5,  albumin 3.8, calcium of 9.5, lipase of 21.  UA was orange clear,  specific gravity 1.035, pH of 5, glucose negative, bilirubin negative,  ketones trace blood negative, protein negative urobilinogen 1.0, nitrite  negative, leukocytes small micro WBC 0-2, bacteria many.  CT of the  abdomen and pelvis showed no acute abdominal findings.  Stool throughout  the colon suggesting constipation, probable sigmoid diverticulitis.  We  were called to admit the patient for further evaluation and  recommendations.   ALLERGIES:  CODEINE AND SULFUR.  CODEINE CAUSES NAUSEA.  SULFA HAS LEAD  TO A DRUG INDUCED HEPATITIS.   PAST MEDICAL HISTORY:  1. Coronary artery disease status post MI in 1992, status post TPA and      PTCA times three in January, June and December 1990 to the  LAD.  2. History of fatigue and exertional fatigue.  3. History of a right occipital CVA status post TPA April 20, 2008.  4. History of asthmatic bronchitis.  5. History of bilateral breast implants.  6. Status post hysterectomy and oophorectomy.  7. Status post appendectomy.  8. Status post ORIF to the right ankle in the past.  9. MRSA cellulitis of the right ankle and the right buttocks.  10.Stress incontinence status post some minor bladder procedure done      in May 2009.  11.History of diverticulosis.   MEDICATIONS:  The patient is not quite sure exactly all of her home  medications and this will need to be addressed per PCP.   MEDICATIONS:  1. Advair discus 250/50 b.i.d.  2. Multivitamin daily.  3. Simvastatin 20 mg daily.  4. Plavix 75 mg daily.  5. Fish oil one tablet daily.  6. Calcium 300 mg b.i.d.  7. Fosamax 70 mg weekly.  8. Glucosamine one tablet daily.  9. Indocin, dose unknown.  10.Vitamin B  complex daily.  11.Cranberry pill daily.  12.Sinus drainage medication as over-the-counter.  13.Garlic pills daily.  14.Coenzyme 10 daily.   SOCIAL HISTORY:  The patient is divorced, remote tobacco history, quit  11 years ago.  Positive alcohol use, two glasses of wine per week.  Has  three children whom are healthy.  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   FAMILY HISTORY:  Significant for diabetes and coronary artery disease.  Father deceased age 37 from an MI.  Mother deceased age 63 from a COPD.  Also, had a history of CABG.  Has three sisters, two sisters with a  history of breast cancer.   REVIEW OF SYSTEMS:  As per HPI, otherwise negative.   PHYSICAL EXAMINATION:  VITAL SIGNS:  Temperature 97.8, blood pressure  117/57, pulse of 86, respiratory rate 18, sating 97% on room air.  GENERAL:  Patient no apparent distress.  HEENT: Normocephalic, atraumatic.  Pupils equal, round and reactive to  light and accommodation.  Extraocular movements intact.  Oropharynx is  clear.  No lesions, no exudates.  NECK:  Supple.  No lymphadenopathy.  RESPIRATORY:  Lungs are clear to auscultation bilaterally.  No wheezes,  no crackles.  No rhonchi.  CARDIOVASCULAR:  Regular rate and rhythm.  No murmurs, rubs or gallops.  ABDOMEN:  Soft, nondistended.  Positive bowel sounds.  Some tenderness  to palpation.  Left lower quadrant greater than the right lower  quadrant.  Some positive suprapubic tenderness.  EXTREMITIES:  No clubbing, cyanosis or edema.  NEUROLOGICAL:  The patient is alert and oriented x3.  Cranial nerves II-  XII are grossly intact.  No focal deficits.   LABORATORY DATA:  CBC white count 12.9, hemoglobin 14.5, platelets 204,  hematocrit 43.8, ANC of 11.1, sodium 139, potassium 4.0, chloride 106,  bicarb 26, BUN 24, creatinine 0.78, glucose of 115, bilirubin 0.9, alk  phosphatase 98, AST 23, ALT 24, calcium of 9.5, protein of 6.5, albumin  of 3.8, lipase of 21.  UA was orange,  clear, specific gravity 1.035, pH  of 5, glucose negative, bilirubin negative, ketones trace blood  negative, protein negative, urobilinogen 1.0, nitrite negative,  leukocytes small micro.  WBCs 0-2, bacteria many.  CT of abdomen and  pelvis shows no acute abdominal findings.  Stool throughout the colon  suggest constipation, probable sigmoid diverticulitis.  No evidence of  pelvic abscess.  Clinical correlation and follow up recommendations to  exclude  any underlying mucosal lesion.  Moderate amount of stool  throughout colon limits mucosal assessment on exam.  EKG with T-wave  inversions in leads V4-V6 which is new from prior EKG.   ASSESSMENT AND PLAN:  [name] is a 70 year old female history of  diverticulosis, coronary artery disease, history of CVA who presents  with 4-day history of worsening abdominal pain.   PROBLEMS:  1. Probable sigmoid diverticulitis.  The patient with a history of      diverticulosis.  Per CT of the abdomen and pelvis, the patient with      a probable sigmoid diverticulitis.  We will admit the patient,      check blood cultures x2, check amylase level, check a magnesium      level, will place on a clear liquid diet, pain management, IV Cipro      and Flagyl and follow.  2. Constipation.  Will place on MiraLax 17 grams b.i.d.  If no      improvement, consider lactulose.  3. EKG changes.  The patient is asymptomatic.  However, T-wave      inversions in leads V4-V6.  Will cycle cardiac enzymes q.8 h x3.      Repeat EKG in the morning.  4. History of right occipital CVA, stable.  Continue Plavix.  5. Coronary artery disease status post MI in the 1990s, status post PT      and PTCA stable.  Continue simvastatin and Plavix.  6. Status post ORIF of the right ankle, stable.  7. History of MRSA cellulitis of the right ankle.  8. History of stress incontinence status post bladder procedure      02/2008.  9. Status post appendectomy.  10.Diverticulosis,  see problem #1.  11.Status post hysterectomy.  12.Prophylaxis.  Protonix for GI prophylaxis.  SCDs for DVT      prophylaxis.   It has been a pleasure taking care of Ms. [name].      Ramiro Harvest, MD  Electronically Signed     DT/MEDQ  D:  08/21/2008  T:  08/22/2008  Job:  [id number]   cc:   [facility] Dr. Gl[facility] Dr. Juanda Chance   Neurology Dr. Pearlean Brownie

## 2011-03-07 NOTE — Consult Note (Signed)
NAMEVICKEE, [name] NO.:  [id number]   MEDICAL RECORD NO.:  [id number]          [name] TYPE:  INP   LOCATION:  2930                         FACILITY:  [facility]   PHYSICIAN:  [name], M.D. LHCDATE OF BIRTH:  1941-02-13   DATE OF CONSULTATION:  02/12/2005  DATE OF DISCHARGE:                                   CONSULTATION   REQUESTING PHYSICIAN:  [name], M.D.   CARDIOLOGIST:  [name], M.D.   REASON FOR CONSULTATION:  Left shoulder and arm discomfort.   HISTORY OF PRESENT ILLNESS:  [name] is a 69 year old woman with a  history of asthmatic bronchitis and coronary artery disease, status post  remote anterior wall myocardial infarction in 1999 treated with lytic  therapy and ultimately angioplasty of Jamie left anterior descending.  Last  ischemic testing was via an adenosine Cardiolite in November of 2003 which  showed an ejection fraction of 55% with anteroseptal wall motion abnormality  and no clear ischemia.  She was last seen by [name] in Jamie office back  in 2003 and has reportedly done fairly well since that time.   Today she was exercising at [employer], using both upper and lower extremity  machines when she noted a sharp discomfort in Jamie left side of her neck,  also involving her left shoulder and upper arm.  This was moderate in  intensity and was exacerbated by both upper and lower extremity exercises.  She eventually went out to her car and took two baby aspirin.  She returned  to Jamie facility to exercise and was noted by staff to be uncomfortable.  It  was urged that she contact her doctor.  She apparently then went to [facility] and  asked for nitroglycerin.  Jamie pharmacy staff contacted [name] office  and did give [name] a nitroglycerin, which apparently did help symptoms  somewhat.  She was ultimately seen by [name] and admitted to Jamie  hospital for further evaluation.  She had an electrocardiogram done in Jamie  office that showed nonspecific anterolateral T-wave changes compared to  previous tracings.  At present, [name] is comfortable with some mild  residual discomfort in Jamie left shoulder that is worse when she moves around  in bed.  Her initial troponin I level was 0.01.  We have been asked to  evaluate her further.   ALLERGIES:  1.  CODEINE.  2.  SULFA DRUGS.   MEDICATIONS AT HOME:  1.  Actonel 35 mg weekly.  2.  Advair 250/50 mg one b.i.d.  3.  Aspirin 81 mg p.o. daily.  4.  Multivitamins.   PAST MEDICAL HISTORY:  1.  Coronary artery disease, status post previous anterior wall myocardial      infarction back in 1990, treated with TPA and subsequently angioplasty      to Jamie left anterior descending.  Most recent ischemic testing was via      an adenosine Cardiolite in November of 2003 showing an ejection fraction      of 55% with marked hypokinesis of Jamie entire apex and anteroseptal  wall,      but no evidence of ischemia.  2.  Previous history of herpes zoster infection actually associated with      chest discomfort.  3.  Asthmatic bronchitis.  4.  Osteoporosis.  5.  Previous history of breast implants.  6.  Status post total abdominal hysterectomy and bilateral salpingo-      oophorectomy secondary to uterine fibroids.  7.  Uncertain lipid status.   SOCIAL HISTORY:  [name] is divorced.  [place]  Her daughter is an emergency room nurse at [employer].  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  She drinks alcohol occasionally, including a glass of wine twice a week.  She has a previous history of significant tobacco use, up to 40 years.  She  has recently gone back to exercising at [employer] and has done this twice over  Jamie last two weeks.  She reports a low-fat diet.   FAMILY HISTORY:  Significant for chronic obstructive pulmonary disease in  [name], who died at age 39.  Jamie  [name]'s father died at age  32 with a myocardial infarction.  She has two sisters with a history of  breast cancer.   REVIEW OF SYSTEMS:  As described in Jamie history of present illness.  She  wears contacts due to decreased vision.  She has had extensive dental work  done over Jamie years.  She does have problems with occasion wheezing and  reports snoring.  She has had problems with previous arthralgias involving  Jamie left shoulder.  She also has a history of hemorrhoids and apparently  hematochezia.  Review of systems otherwise negative.   PHYSICAL EXAMINATION:  VITAL SIGNS:  Jamie temperature is 97.7 degrees, heart  rate 69, respirations 20, blood pressure 125/60 and oxygen saturation 99% on  room air.  GENERAL APPEARANCE:  This is a well-nourished woman lying in bed in no acute  distress.  HEENT:  Jamie conjunctivae are normal.  Jamie oropharynx is clear.  NECK:  Supple without elevated jugular venous pressure.  No carotid bruits.  No thyromegaly is noted.  LUNGS:  Clear without labored breathing at rest.  CARDIAC:  Regular rate and rhythm without loud murmur, S3 gallop or  pericardial rub.  ABDOMEN:  Soft and nontender with normoactive bowel sounds.  No tenderness  or guarding noted to palpation.  No organomegaly is noted.  EXTREMITIES:  No cyanosis, clubbing or edema.  Peripheral pulses are 2+.  In  moving Jamie extremities, [name] reports and exacerbation of pain in Jamie  shoulder and neck area, although she is not entirely pain-free at rest.  No  kyphosis is noted.  NEUROPSYCHIATRIC:  [name] is alert and oriented x 3.   LABORATORY DATA:  CK-MB 2.2, troponin 0.01, CK 103.  Sodium 141, potassium  4.2, chloride 107, bicarbonate 28, glucose 98, BUN 14, creatinine 08.  WBC  7.7, hemoglobin 14.9, hematocrit 42.7, platelets 203.   Chest x-ray is pending.   IMPRESSION: 1.  Recent onset of left shoulder and neck discomfort while exercising at      [employer].  [name] does  report some improvement in symptoms with      nitroglycerin, although on Jamie other hand has exacerbation of symptoms      on movement of Jamie upper extremities.  Her initial point of care cardiac      markers are normal and  she has been noted to have nonspecific      electrocardiographic T-wave changes compared to previous tracings.  Jamie      last ischemic testing was in 2003.  2.  Known coronary artery disease, status post previous anterior wall      myocardial infarction in 1990 with subsequent angioplasty to Jamie left      anterior descending as discussed above.  3.  History of asthmatic bronchitis.   RECOMMENDATIONS:  1.  Would continue to cycle cardiac markers.  I agree with Lovenox, aspirin      and nitroglycerin for Jamie time being.  Will likely need to avoid beta      blockers given [name]'s asthmatic bronchitis.  2.  As symptoms seem somewhat atypical and [name] herself states that      they are different in quality from previous angina pain.  Nevertheless,      she has not had recent ischemic testing an will likely require such.      Jamie plan will be to proceed with serial cardiac markers and have Dr.      Juanda [name] evaluate [name] in Jamie morning.  One could consider repeat      diagnostic angiography for a clear diagnosis of her cardiac status.  3.  Further recommendations to follow to look for evolutionary changes and      have enzymes reviewed.      SGM/MEDQ  D:  02/12/2005  T:  02/12/2005  Job:  [id number]

## 2011-03-07 NOTE — Discharge Summary (Signed)
NAMEKADIJAH, SHAMOON NO.:  [id number]   MEDICAL RECORD NO.:  [id number]          PATIENT TYPE:  INP   LOCATION:  1508                         FACILITY:  [facility]   PHYSICIAN:  Georges Lynch. Gioffre, M.D.DATE OF BIRTH:  07/04/1941   DATE OF ADMISSION:  01/02/2006  DATE OF DISCHARGE:  01/07/2006                                 DISCHARGE SUMMARY   ADMISSION DIAGNOSES:  1.  Right ankle bimalleolar fracture closed.  2.  History of coronary artery disease.  3.  History of myocardial infarction.  4.  History of asthma.   DISCHARGE DIAGNOSES:  1.  Status post open reduction and internal fixation of right ankle      bimalleolar fracture.  2.  Coronary artery disease.  3.  History of myocardial infarction.  4.  History of asthma.   HISTORY OF PRESENT ILLNESS:  The patient is a 70 year old female. Her feet  gave out on her as she was going up the steps carrying packages in her  house. She went to the emergency room and x-rays were taken. She was shown  to have a right ankle bimalleolar fracture. The patient was admitted by Dr.  Ranell Patrick. The patient requested Dr. Darrelyn Hillock for surgical repairs. The patient  was bedded down for 24 hours until Dr. Darrelyn Hillock would be able to provide  services.   PROCEDURE:  On March 17, the patient was taken to the OR by Dr. Ranee Gosselin, assisted by Oneida Alar, PA-C under general anesthesia and underwent  an ORIF of her right ankle bimalleolar fracture. The patient tolerated the  procedure well. There were no complications. The patient was discharged from  to the recovery room to the orthopedic floor in good condition.   CONSULTATIONS:  Physical therapy.   HOSPITAL COURSE:  On March 16, the patient was admitted to [facility] through the ER to Dr. Dietrich Pates services for a right ankle  bimalleolar fracture. The patient requested Dr. Darrelyn Hillock for surgical repair.  He was going out the following day on calls so patient was bedded down  and  Dr. Darrelyn Hillock evaluated her on the morning of the 17th and the patient was  taken to the OR where an ORIF of the ankle was performed without any  complications. The patient then incurred several days postoperative care on  the orthopedic floor which the patient had multiple complaints and problems  related to her care provided on the floor by the staff but from the medical  standpoint the patient's wound had a moderate amount of swelling in a split  bivalve cast. This was improved and she had no neurologic or vascular  compromise of the lower extremity. The cast was changed on the date of her  discharge for new dressings. The wounds were without any signs of infection.  She had some small areas of pressure blisters. She had good pulse in the  foot, good sensation. A new cast was applied and then it was bivalved to  allow for continued swelling. The patient's vital signs were stable. The  patient had no other medical issues. The patient  was discharged home with  routine postop care instructions.   LABORATORY DATA:  CBC on March 18, WBC is 11.7 felt to just post surgical  stressing. Hemoglobin 11.9, hematocrit 35.6, platelets 189. INR on the 21st  was 1.9 on Coumadin therapy for DVT prophylaxis. Routine chemistries on the  17th showed sodium 139, potassium 3.8, glucose 125 felt to be post surgical  __________ stress. BUN 19, creatinine 0.9. EKG on admission was normal sinus  rhythm at 66 beats per minute.   DISCHARGE MEDICATIONS:  1.  Advair inhaler 1 puff b.i.d.  2.  Zocor 20 mg a day.  3.  Multivitamins 1 tablet a day.  4.  Combivent inhaler 1 puff p.r.n.  5.  Laxative of choice p.r.n.  6.  Robaxin 500 mg q.6 h p.r.n.  7.  Reglan 10 mg p.o. q.6 h p.r.n.  8.  Dilaudid 2 mg tablets 100 tablets every 4-6 h p.r.n.  9.  Coumadin 5 mg a day.   DISCHARGE INSTRUCTIONS:  1.  Diet no restrictions.  2.  Activity - the patient is to walk with the assistance of walker. She is      to be  nonweightbearing on her right lower extremity. She is otherwise to      keep her leg elevated.   WOUND CARE:  The patient is to keep cast clean and dry.   MEDICATIONS:  The patient is to resume routine home meds with the addition  of:  1.  Robaxin 500 mg 1 tablet 3 times a day for spasms if needed.  2.  Mepergan Fortis 1 tablet every 4-6 h for pain if needed.  3.  Coumadin 5 mg 1 tablet a day for 30 days.   FOLLOW UP:  1.  The patient is to call [phone] for a followup appointment on March 26.  2.  [facility] for INR checks and ambulation assistance      instructions.   CONDITION ON DISCHARGE:  Improved and good.      [name], P.A.    ______________________________  Georges Lynch Darrelyn Hillock, M.D.    RWK/MEDQ  D:  01/21/2006  T:  01/22/2006  Job:  [id number]

## 2011-03-07 NOTE — Discharge Summary (Signed)
[name], [name] NO.:  [id number]   MEDICAL RECORD NO.:  [id number]          PATIENT TYPE:  INP   LOCATION:  2930                         FACILITY:  [facility]   PHYSICIAN:  Rene Paci, M.D. LHCDATE OF BIRTH:  10/08/1941   DATE OF ADMISSION:  02/12/2005  DATE OF DISCHARGE:  02/13/2005                                 DISCHARGE SUMMARY   DISCHARGE DIAGNOSIS:  Atypical chest pain.   BRIEF ADMISSION HISTORY:  [name] is a 70 year old white female who was  working out when she developed weakness on the left side, both upper and  lower extremities. She developed sharp pain followed by aching in the left  neck and shoulder.  She rated this as a 6/10.  No nausea, vomiting,  diaphoresis.  She also had some shortness of breath and dyspnea on exertion.  That is not new.  She did go to see her primary care physician.   PAST MEDICAL HISTORY:  1.  Coronary artery disease, status post anterior myocardial infarction in      1990, status post TPA and PCTA to the LAD.  Last catheterization in      1996.  2.  Status post adenosine Cardiolite November 2003 that revealed EF of 55%      with anterior septal hypokinesis but no ischemia.  3.  Asthmatic bronchitis.  4.  Shingles in 2000.  5.  Osteopenia.  6.  Status post breast implants.  7.  Status post total abdominal hysterectomy secondary to fibroids, status      post bilateral salpingo-oophorectomy.   HOSPITAL COURSE:  Cardiovascular.  The patient was there with chest pain  with a known history of coronary disease.  Serial cardiac enzymes were  negative.  EKG was without acute ischemia.  The patient was seen in  consultation by Dr. Juanda Chance.  He recommended an outpatient Cardiolite.   LABORATORY DATA:  At discharge, CBC was normal.  BMET was normal.  Hepatic  function profile was normal except for an alk phos of 119.   DISCHARGE MEDICATIONS:  1.  Actonel 35 mg a week.  2.  Advair 250/50 b.i.d.  3.  Aspirin 81 mg  daily.   FOLLOW UP:  Followup for an adenosine Myoview Monday, May 1, at 12:30 and  follow up with Dr. Juanda Chance in two to three weeks.  His office will call to  schedule the appointment.      LC/MEDQ  D:  02/13/2005  T:  02/13/2005  Job:  [id number]   cc:   Charlies Constable, M.D. [facility]   [name]. [name], M.D. [facility]

## 2011-03-07 NOTE — Op Note (Signed)
[name], [name]              ACCOUNT NO.:  [id number]   MEDICAL RECORD NO.:  [id number]          PATIENT TYPE:  INP   LOCATION:  1416                         FACILITY:  [facility]   PHYSICIAN:  Georges Lynch. Gioffre, M.D.DATE OF BIRTH:  [birth year]   DATE OF PROCEDURE:  01/03/2006  DATE OF DISCHARGE:                                 OPERATIVE REPORT   SURGEON:  Georges Lynch. Darrelyn Hillock, M.D.   ASSISTANT:  [name], P.A.   PREOP DIAGNOSIS:  Bimalleolar ankle fracture on the right.   POSTOP DIAGNOSIS:  Bimalleolar ankle fracture on the right.   OPERATION:  Open reduction internal fixation of a bimalleolar ankle  fracture, right.   PROCEDURE:  Under general anesthesia, routine orthopedic prep of the right  lower extremity was carried out. Then I did a sterile prep of the right  lower extremity. The patient was given 1 gram of IV vancomycin preop as she  has a tendency to have staph infections. Following this the leg was  exsanguinated, and Esmarch tourniquet was elevated at 350 mmHg. An incision  was made over the distal fibula on the left; bleeders identified and  cauterized. Great care was taken not to injure the underlying sensory nerve.   At this time we identified the fracture site. We gently stripped the  periosteum with the lateral cortex with the Key elevator and then applied a  6-hole syntheses plate. We affixed the plate to the fibular shaft in the  usual fashion with 6 screws. I thoroughly irrigated out the area; and then  closed the wound in layers in usual fashion. Skin was closed with metal  staples. Note, we did utilize a C-arm to make sure that our screw lengths  were proper and the fracture was reduced.   We then made an incision over the medial malleolar region with great care  taken not to injure the underlying tendons and neurovascular structures. We  identified the fracture site. We removed the periosteum that was just  trapped down into the fracture site. Once  this was removed, we reduced the  fracture site. A guide pin was inserted across the fracture by use of the C-  arm; we had good control. We then made our appropriate drill hole for a 4.5  mm cortical screw.  The screw measured 46 mm in length. We had good  fixation. We observed the fracture site with the C-arm on the AP, lateral,  and oblique. We thoroughly irrigated out the area, closed the wound in  layers in usual fashion. Metal staples were used to close the skin. We then  applied a Neosporin dressing. We applied a nice bulky dressing, and then a  short leg cast. Note, the cast was applied after the tourniquet was  released.           ______________________________  Georges Lynch Darrelyn Hillock, M.D.     RAG/MEDQ  D:  01/03/2006  T:  01/05/2006  Job:  [id number]

## 2011-03-07 NOTE — Discharge Summary (Signed)
NAMETAFFY, DELCONTE NO.:  [id number]   MEDICAL RECORD NO.:  [id number]          PATIENT TYPE:  INP   LOCATION:  1612                         FACILITY:  [facility]   PHYSICIAN:  Georges Lynch. Gioffre, M.D.DATE OF BIRTH:  12-25-1940   DATE OF ADMISSION:  02/12/2006  DATE OF DISCHARGE:  02/16/2006                                 DISCHARGE SUMMARY   ADMISSION DIAGNOSES:  1.  Cellulitis right lower extremity.  2.  Asthma.   DISCHARGE DIAGNOSES:  1.  Cellulitis, resolved __________.  2.  Asthma.   MEDICATIONS ON ADMISSION:  1.  Advair inhaler.  2.  Multivitamin.  3.  Combivent inhaler.  4.  Robaxin.  5.  Mepergan Fortis.   ALLERGIES:  CODEINE and SULFA.   HISTORY OF PRESENT ILLNESS:  The patient is a 70 year old female who had an  ORIF done by Dr. Darrelyn Hillock several weeks previous. He was sent to the office  with cellulitic type reactions several days previous. The patient's trialed  oral antibiotics without any improvement and was being admitted for IV  antibiotic treatment. The patient has a history of staph infections  __________.   SURGICAL PROCEDURES:  None.   CONSULTATIONS:  1.  Physical therapy consult.  2.  IV nurse consult for PICC line placement.   HOSPITAL COURSE:  On February 11, 2006, the patient was admitted to [facility] for IV antibiotic treatment for her cellulitis of her right lower  extremity lateral incision of her ORIF of her ankle fracture. The patient  had multiple days of IV antibiotics on Vancomycin. He wound gradually  improved without any further interventions. The patient had intraop hospital  issues related to narcotics. She was being treated prehospital with  Mepergan. During her hospitalization, she was transitioned to OxyContin  which caused problems so we had to re-transition her back to Demerol and  Phenergan for better pain control and less side effects. It was felt on  April 30, the patient was orthopedically and  medically stable and ready for  transfer to home with outpatient IV antibiotic treatment and the patient was  discharged in good condition.   LABORATORY DATA:  CBC on April 27, WBC is 8.4, hemoglobin 12.9, hematocrit  38.4, platelets 2.8. Sed rate on April 27 was 15. INR on April 30 was 1.7.  Vancomycin trough on April 28 was 8.8.   DISCHARGE MEDICATIONS:  1.  Zocor 20 mg p.o. q.o.d.  2.  Robaxin 500 mg p.o. q.8 h.  3.  Multivitamin 1 tablet a day.  4.  Combivent 1 puff p.r.n.  5.  Colace 100 mg a day.  6.  Vancomycin 1250 mg IV daily.  7.  Advair 1 puff b.i.d.  8.  OxyIR discontinued.  9.  Hydromorphone 4 mg p.o. q.4 h p.r.n., discontinued.  10. Demerol 50 mg p.o. q.3 h p.r.n.  11. Phenergan 25 mg p.o. q.3 h p.r.n.  12. Aspirin 325 mg p.o. b.i.d.   DISCHARGE INSTRUCTIONS:  1.  Diet - no restrictions.  2.  Activity - patient may walk with assistance or the use of the  walker.  3.  Wound care - patient is to keep wound clean and dry.   MEDICATIONS:  Audelia Acton  patient is to resume routine home meds with the  addition of:  1.  Aspirin 325 mg twice a day.  2.  IV Vancomycin per home health agency.   FOLLOWUP:  With Dr. Darrelyn Hillock the following Tuesday. The patient is to arrange  an appointment.   IV antibiotics per critical care, phone [phone].   CONDITION ON DISCHARGE:  Listed as improved and good.      [name], P.A.    ______________________________  Georges Lynch Darrelyn Hillock, M.D.    RWK/MEDQ  D:  03/24/2006  T:  03/24/2006  Job:  [id number]

## 2011-03-07 NOTE — H&P (Signed)
[name], RINGLE NO.:  [id number]   MEDICAL RECORD NO.:  [id number]          PATIENT TYPE:  INP   LOCATION:  1612                         FACILITY:  [facility]   PHYSICIAN:  Georges Lynch. Gioffre, M.D.DATE OF BIRTH:  [birth year]   DATE OF ADMISSION:  02/12/2006  DATE OF DISCHARGE:  02/16/2006                                HISTORY & PHYSICAL   COMMENT:  This was performed in the office prior to admission.  The patient  was provided with a written copy, but failed to bring it from the office  directly to the hospital with her.   HISTORY OF PRESENT ILLNESS:  The patient is a 70 year old female with a  history of an ORIF of her right ankle of her bimalleolar fracture by Dr.  Darrelyn Hillock on January 13, 2006.  The patient was progressing slowly with wound-  healing, developed a cellulitic-type skin reaction over the lateral incision  several days prior to February 12, 2006, date of admission.  She was placed on  oral antibiotics; these did not progress fast enough for the patient, so she  was seen in the office today and requested admission for IV antibiotics.  The patient will be admitted for PICC line placement and IV vancomycin  treatment.  The patient does have a history of cellulitis in the past at  multiple surgical sites with Staph infection.  The patient's wound today did  look cellulitis in appearance with diffuse erythemic skin reaction across  the lateral aspect of her wound.  She had absolutely no gross purulent  discharge.   PAST SURGICAL HISTORY:  ORIF of the right ankle on January 03, 2006.   MEDICATIONS ON ADMISSION:  1.  Advair inhaler one puff b.i.d.  2.  Zocor 20 mg a day.  3.  Multivitamins.  4.  Combivent inhaler one puff p.r.n.  5.  Laxative of choice.  6.  Robaxin 500 mg p.o. q.6 h. p.r.n.  7.  Reglan 10 mg p.o. q.6 h. p.r.n.  8.  Demerol 50 mg p.o. q.4-6 h. p.r.n.   PAST MEDICAL HISTORY:  1.  Coronary artery disease.  2.  History of myocardial  infarction.  3.  History of asthma.   SOCIAL HISTORY:  She is a smoker.   PRIMARY CARE PHYSICIAN:  Dr. [name].   CARDIOLOGIST:  Dr. Juanda Chance.   DRUG ALLERGIES:  CODEINE and SULFA.   PHYSICAL EXAMINATION:  GENERAL:  She is a healthy-appearing elderly female  appearing approximately 70 years of age.  NECK:  She has no lymphadenopathy.  There is good range of motion.  CHEST:  Clear and equal.  No adventitious lung sounds.  HEART:  Regular rate and rhythm.  ABDOMEN:  Soft.  No discomfort with deep palpation.  The bowel sounds are  normoactive.  EXTREMITIES:  Extremities were normal-appearing with the exception of her  right lower extremity, with which she had difficulty with range of motion of  her ankle joint.  She does have a cellulitic reaction over the lateral  aspect of her ankle with a well-approximated incision.  She had good  sensation about  the toes.  NEUROLOGIC:  The patient is conscious, alert and appropriate.   IMPRESSION:  1.  Superficial cellulitis reaction over a surgical incision, right ankle,      lateral border.  2.  History of coronary artery disease with history of myocardial      infarction.  3.  History of asthma.   PLAN:  The patient will be admitted for IV antibiotic treatment of her  cellulitis due to the history of past Staph infections.  The patient will  have a PICC line placed and will start IV vancomycin.  The patient will also  be evaluated for possible surgical debridement.      [name], P.A.    ______________________________  Georges Lynch Darrelyn Hillock, M.D.    RWK/MEDQ  D:  03/24/2006  T:  03/24/2006  Job:  [id number]

## 2011-03-07 NOTE — Assessment & Plan Note (Signed)
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 She is referred today by Dr. [name] for evaluation  of screening colonoscopy.   [name] really denies any GI complaints except for occasional rectal  bleeding if she strains.  She generally has normal bowel movements and  denies melena or true hematochezia.  She denies abdominal pain, gas, blood,  or upper GI or hepatobiliary complaints.   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  Her appetite is good and she has gained 10 pounds of weight over the  last several months.  She denies eccentric food intolerances.  She never had  colonoscopy care, barium enemas, other imaging studies of her bowels.   PAST MEDICAL HISTORY:  __She has a rather involved cardiovascular history  with myocardial infarction in 1990.  She has had several angiographies and  angioplasties but has not had stents.  She additionally suffers from  asthmatic bronchitis. She has had multiple surgical procedures including  hysterectomy, tubal ligation, breast augmentation surgery, and appendectomy.  She is followed cardiac-wise by Dr. Juanda Chance and is followed by Dr. [name] for  asthmatic bronchitis.   In review of her chart she has a previous remote anterior wall infarction,  has had good residual, good left ventricular function.   FAMILY HISTORY:  Noncontributory.   PRESENT:  Gastrointestinal problems.   SOCIAL HISTORY:  She is divorced, lives alone, she has a Naval architect.  She does not smoke, uses ethanol socially.   REVIEW OF SYSTEMS:  Noncontributory.   MEDICATIONS:  1. Lipitor 10 mg daily.  2. Calcium 1500 mg  daily.  3. Fish oil daily.  4. Glucosamine daily.  5. Multivitamins with B12 and B6.  6. Aspirin 81 mg daily.  7. Fosamax weekly.  8. Celebrex daily.  9. Advair twice a day.  10.Garlic daily.   She in the past has had reactions to SULFA and CODEINE.   EXAMINATION:  She is an attractive healthy appearing white female, appearing  younger than her stated age.  She is 5 feet 2 inches tall and weighs 161 pounds.  Blood pressure is  119/72.  Pulse was 56 and regular.  I could not appreciate a stigmatic chronic liver disease.  Her chest was entirely clear to percussion and auscultation.  I could not appreciate murmurs, gallops or rubs. She appeared to be in  regular cardiac rhythm.  There is no organosplenomegaly, abdominal masses or tenderness.  Bowel  sounds are normal.  Both extremities were unremarkable and mental status was clear.  Rectal exam was deferred today.   ASSESSMENT:  1. Procede with screening colonoscopy because of her age and history of      intermittent rectal bleeding.  2. Atherosclerosis with cardiovascular disease and previous angioplasties.  3. Status post  multiple surgery procedures.  4. History of asthmatic bronchitis.  5. History of degenerative arthritis with right ankle fracture which      required open reduction and fixation by Dr. Darrelyn Hillock in March 2007.   RECOMMENDATIONS:  1. Outpatient colonoscopy at her convenience.  I will continue salicylates      per cardiovascular history.  I have explained to her a slight increased      risk of __________ polypectomy should this be indicated.  2. Continue all of the medications listed above per Dr. [name].     Vania Rea. Jarold Motto, MD, Caleen Essex, FAGA  Electronically Signed    DRP/MedQ  DD: 08/18/2006  DT: 08/19/2006  Job #: [id number]   cc:   [name]. [name], MD,FACP,FCCP  [facility]. Marcelle Overlie, M.D.  Ronald A. Darrelyn Hillock, M.D.

## 2011-03-27 ENCOUNTER — Other Ambulatory Visit (INDEPENDENT_AMBULATORY_CARE_PROVIDER_SITE_OTHER): Payer: Medicare Other

## 2011-03-27 ENCOUNTER — Ambulatory Visit (INDEPENDENT_AMBULATORY_CARE_PROVIDER_SITE_OTHER): Payer: Medicare Other | Admitting: Gastroenterology

## 2011-03-27 ENCOUNTER — Encounter: Payer: Self-pay | Admitting: Gastroenterology

## 2011-03-27 VITALS — BP 132/74 | HR 80 | Temp 97.6°F | Ht 66.0 in | Wt 166.0 lb

## 2011-03-27 DIAGNOSIS — K5900 Constipation, unspecified: Secondary | ICD-10-CM | POA: Insufficient documentation

## 2011-03-27 DIAGNOSIS — K5732 Diverticulitis of large intestine without perforation or abscess without bleeding: Secondary | ICD-10-CM

## 2011-03-27 DIAGNOSIS — R6889 Other general symptoms and signs: Secondary | ICD-10-CM

## 2011-03-27 DIAGNOSIS — D509 Iron deficiency anemia, unspecified: Secondary | ICD-10-CM

## 2011-03-27 DIAGNOSIS — I10 Essential (primary) hypertension: Secondary | ICD-10-CM

## 2011-03-27 DIAGNOSIS — E785 Hyperlipidemia, unspecified: Secondary | ICD-10-CM

## 2011-03-27 DIAGNOSIS — Z8711 Personal history of peptic ulcer disease: Secondary | ICD-10-CM

## 2011-03-27 LAB — CBC WITH DIFFERENTIAL/PLATELET
Basophils Absolute: 0 10*3/uL (ref 0.0–0.1)
Eosinophils Absolute: 0.3 10*3/uL (ref 0.0–0.7)
HCT: 41 % (ref 36.0–46.0)
Hemoglobin: 13.9 g/dL (ref 12.0–15.0)
Lymphs Abs: 1.7 10*3/uL (ref 0.7–4.0)
MCHC: 33.9 g/dL (ref 30.0–36.0)
MCV: 87.7 fl (ref 78.0–100.0)
Monocytes Absolute: 0.6 10*3/uL (ref 0.1–1.0)
Neutro Abs: 4.3 10*3/uL (ref 1.4–7.7)
Platelets: 184 10*3/uL (ref 150.0–400.0)
RDW: 15.1 % — ABNORMAL HIGH (ref 11.5–14.6)

## 2011-03-27 LAB — BASIC METABOLIC PANEL
CO2: 25 mEq/L (ref 19–32)
Calcium: 8.7 mg/dL (ref 8.4–10.5)
Creatinine, Ser: 1.1 mg/dL (ref 0.4–1.2)
Glucose, Bld: 93 mg/dL (ref 70–99)

## 2011-03-27 LAB — SEDIMENTATION RATE: Sed Rate: 10 mm/hr (ref 0–22)

## 2011-03-27 LAB — FERRITIN: Ferritin: 57.5 ng/mL (ref 10.0–291.0)

## 2011-03-27 LAB — VITAMIN B12: Vitamin B-12: >1500 pg/mL — ABNORMAL HIGH (ref 211–911)

## 2011-03-27 LAB — HEPATIC FUNCTION PANEL
ALT: 30 U/L (ref 0–35)
Albumin: 4 g/dL (ref 3.5–5.2)
Total Protein: 6.7 g/dL (ref 6.0–8.3)

## 2011-03-27 MED ORDER — METRONIDAZOLE 500 MG PO TABS: 500.0000 mg | ORAL_TABLET | Freq: Two times a day (BID) | ORAL | Status: AC

## 2011-03-27 MED ORDER — NA SULFATE-K SULFATE-MG SULF 17.5-3.13-1.6 GM/177ML PO SOLN: ORAL | Status: DC

## 2011-03-27 MED ORDER — SACCHAROMYCES BOULARDII 250 MG PO CAPS: 250.0000 mg | ORAL_CAPSULE | Freq: Every day | ORAL | Status: AC

## 2011-03-27 MED ORDER — POLYETHYLENE GLYCOL 3350 17 GM/SCOOP PO POWD: 17.0000 g | Freq: Every day | ORAL | Status: DC

## 2011-03-27 MED ORDER — POLYETHYLENE GLYCOL 3350 17 GM/SCOOP PO POWD: 17.0000 g | Freq: Every day | ORAL | Status: AC

## 2011-03-27 MED ORDER — CIPROFLOXACIN HCL 500 MG PO TABS: 500.0000 mg | ORAL_TABLET | Freq: Two times a day (BID) | ORAL | Status: AC

## 2011-03-27 NOTE — Progress Notes (Signed)
This is a 70 year old Caucasian female with recurrent relapsing diverticulitis. She had shoulder repair several months ago and was on hydrocodone, but now is only on tramadol for pain. She's had one week of left lower quadrant discomfort with rather severe constipation but no systemic complaints, melena or hematochezia. Apparently she has acute diverticulitis documented by CT scan every 6 months, last colonoscopy in 2007. She follows a high fiber diet and avoids seeds and nuts. She denies upper gastrointestinal or hepatobiliary complaints this time. Allegedly she has not had a bowel movement in 7 days. She denies nausea and vomiting, fever, chills, or other systemic complaints. She has had previous appendectomy and hysterectomy. Also noted is a previous history of a gastric ulceration, coronary artery disease, and several orthopedic procedures.  Current Medications, Allergies, Past Medical History, Past Surgical History, Family History and Social History were reviewed in [employer] record.  Pertinent Review of Systems Negative,, history of asthmatic bronchitis with periodic steroid use, but she currently denies to cardiovascular, pulmonary, genitourinary problems. Continues with left shoulder pain relieved by tramadol. He does have a history of peptic ulcer disease and is on omeprazole 20 mg a day. 4 osteoporosis she is on Fosamax and calcium with vitamin D. Patient is on Celebrex 200 mg daily, copper all XL 25 mg a day, simvastatin, and a variety of multivitamins and B12   Physical Exam: Awake and alert no acute distress appears stated age. Abdominal exam shows no distention, organomegaly, masses or tenderness. Bowel sounds are normal. Inspection the rectum is unremarkable as is rectal exam several hard formed stool in the rectal vault without a definite impaction. Stool is guaiac negative. Will set [place] is clear there no gross focal neurological deficits. She appear to be in a regular  rhythm at this time without murmurs gallops or rubs her chest is clear without wheezes or rhonchi.    Assessment and Plan: History of diarrhea predominant IBS , currently with constipation and resolving diverticulitis. She has hadl several days of Cipro 119 mg twice a day and metronidazole 500 mg twice a day. Have given her a Super Bowel Prep to relieve her impaction and then have asked her to take MiraLax 8 ounces on a regular basis as bedtime as needed. We will complete a two-week course of metronidazole and Cipro, with office followup in 2-3 weeks' time. Repeat laboratory evaluation ordered today. I am concerned this patient has frequent relapsing diverticulitis, and she will need followup colonoscopy and probable surgical referral. She does not appear toxic or systemically ill at this time. Have advised to continue all of her other medications as listed and reviewed in her chart. She apparently is not on Plavix at this time but she is on aspirin 81 mg a day. Encounter Diagnosis  Name Primary?  . Diverticulitis of colon (without mention of hemorrhage) Yes

## 2011-03-27 NOTE — Progress Notes (Signed)
Addended by: Harlow Mares D on: 03/27/2011 12:18 PM   Modules accepted: Orders

## 2011-03-27 NOTE — Patient Instructions (Addendum)
Please go to the basement today for your labs. ' We have Sent Flagy and Cipro to your pharmacy #[id number].  You need to buy florastor OTC and take once a day while on the antibiotics. You need to buy Miralax OTC and mix one scoop in 8 ounces of water and drink every night once you have completed the Suprep.  We gave you a suprep today you are to follow the inclosed instructions and complete the entire prep. You need to remain on liquids while doing this prep.  Make an appt to come back to see Dr Jarold Motto in 3 weeks.

## 2011-04-01 ENCOUNTER — Encounter (INDEPENDENT_AMBULATORY_CARE_PROVIDER_SITE_OTHER): Payer: Self-pay

## 2011-04-01 DIAGNOSIS — J449 Chronic obstructive pulmonary disease, unspecified: Secondary | ICD-10-CM

## 2011-04-07 ENCOUNTER — Telehealth: Payer: Self-pay | Admitting: Pulmonary Disease

## 2011-04-07 NOTE — Telephone Encounter (Signed)
[email or web address]  

## 2011-04-08 NOTE — Telephone Encounter (Signed)
[email or web address].Jennifer Castillo, CMA  

## 2011-04-08 NOTE — Telephone Encounter (Signed)
I am aware. Not at this time but we will keep her in mind if any other study comes up.

## 2011-04-08 NOTE — Telephone Encounter (Signed)
PATIENT RETURNED LESLIE'S CALL.  PLEASE CALL BACK AT [phone]

## 2011-04-08 NOTE — Telephone Encounter (Signed)
Spoke with the patient and she states that she did not qualify for the COPD study that Dr. Vassie Loll rec for her and she wants to know if there is any other study that he is aware of that she may qualify for. Please advise. Carron Curie, CMA

## 2011-04-09 NOTE — Telephone Encounter (Signed)
Pt returned call > advised of no other current COPD study per RA but that we will keep her in mind.  Pt okay with this and verbalized her understanding.

## 2011-04-28 ENCOUNTER — Ambulatory Visit: Payer: Medicare Other | Admitting: Gastroenterology

## 2011-05-05 ENCOUNTER — Ambulatory Visit (INDEPENDENT_AMBULATORY_CARE_PROVIDER_SITE_OTHER): Payer: Medicare Other | Admitting: Pulmonary Disease

## 2011-05-05 ENCOUNTER — Encounter: Payer: Self-pay | Admitting: Pulmonary Disease

## 2011-05-05 VITALS — BP 110/70 | HR 85 | Temp 98.0°F | Ht 69.0 in | Wt 171.4 lb

## 2011-05-05 DIAGNOSIS — J449 Chronic obstructive pulmonary disease, unspecified: Secondary | ICD-10-CM

## 2011-05-05 NOTE — Patient Instructions (Signed)
Stay on advair & spiriva We will keep you in mind for future studies

## 2011-05-05 NOTE — Progress Notes (Signed)
  Subjective:    Patient ID: [name], female    DOB: [birth year], 70 y.o.   MRN: [id number]  HPI 70/F, ex smoker, realtor with Gold stg 3 COPD FEV1 1.0 L (48%), on O2 since 10/10 for FU.  She smoked for 40 years and quit in 1999. Advair worked better than symbicort.  Desaturates to 90% on ambulation 2/11  November , 2010 -Acute visit for 'bronchitis >> improved with avelox & low dose steroids.  January , 2011 -Adm [place] for diverticulitis   April 03, 2010  SIck visit 6/11 chest cold - cefdinir helped ! (avelox too expensive) Completed course of steroids.   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     Review of Systems Pt denies any significant  nasal congestion or excess secretions, fever, chills, sweats, unintended wt loss, pleuritic or exertional cp, orthopnea pnd or leg swelling.  Pt also denies any obvious fluctuation in symptoms with weather or environmental change or other alleviating or aggravating factors.    Pt denies any increase in rescue therapy over baseline, denies waking up needing it or having early am exacerbations or coughing/wheezing/ or dyspnea      Objective:   Physical Exam Gen. Pleasant, well-nourished, in no distress ENT - no lesions, no post nasal drip Neck: No JVD, no thyromegaly, no carotid bruits Lungs: no use of accessory muscles, no dullness to percussion, clear without rales or rhonchi  Cardiovascular: Rhythm regular, heart sounds  normal, no murmurs or gallops, no peripheral edema Musculoskeletal: No deformities, no cyanosis or clubbing          Assessment & Plan:

## 2011-05-07 NOTE — Assessment & Plan Note (Signed)
Ct advair & spiriva She does not want to join pulm rehab Discussed plan for flare

## 2011-05-13 ENCOUNTER — Encounter: Payer: Self-pay | Admitting: Gastroenterology

## 2011-05-13 ENCOUNTER — Ambulatory Visit (INDEPENDENT_AMBULATORY_CARE_PROVIDER_SITE_OTHER): Payer: Medicare Other | Admitting: Gastroenterology

## 2011-05-13 ENCOUNTER — Inpatient Hospital Stay (HOSPITAL_COMMUNITY)
Admission: AD | Admit: 2011-05-13 | Discharge: 2011-05-17 | DRG: 372 | Disposition: A | Discharge status: Home / Self Care | Payer: Medicare Other | Source: Ambulatory Visit | Attending: Gastroenterology | Admitting: Gastroenterology

## 2011-05-13 VITALS — BP 80/40 | HR 100 | Temp 97.7°F

## 2011-05-13 DIAGNOSIS — J449 Chronic obstructive pulmonary disease, unspecified: Secondary | ICD-10-CM | POA: Diagnosis present

## 2011-05-13 DIAGNOSIS — I2510 Atherosclerotic heart disease of native coronary artery without angina pectoris: Secondary | ICD-10-CM | POA: Diagnosis present

## 2011-05-13 DIAGNOSIS — K5732 Diverticulitis of large intestine without perforation or abscess without bleeding: Secondary | ICD-10-CM

## 2011-05-13 DIAGNOSIS — Z8673 Personal history of transient ischemic attack (TIA), and cerebral infarction without residual deficits: Secondary | ICD-10-CM

## 2011-05-13 DIAGNOSIS — J4489 Other specified chronic obstructive pulmonary disease: Secondary | ICD-10-CM | POA: Diagnosis present

## 2011-05-13 DIAGNOSIS — E860 Dehydration: Secondary | ICD-10-CM | POA: Diagnosis present

## 2011-05-13 DIAGNOSIS — F411 Generalized anxiety disorder: Secondary | ICD-10-CM | POA: Diagnosis present

## 2011-05-13 DIAGNOSIS — I10 Essential (primary) hypertension: Secondary | ICD-10-CM | POA: Diagnosis present

## 2011-05-13 DIAGNOSIS — Z87891 Personal history of nicotine dependence: Secondary | ICD-10-CM

## 2011-05-13 DIAGNOSIS — M949 Disorder of cartilage, unspecified: Secondary | ICD-10-CM | POA: Diagnosis present

## 2011-05-13 DIAGNOSIS — M899 Disorder of bone, unspecified: Secondary | ICD-10-CM | POA: Diagnosis present

## 2011-05-13 DIAGNOSIS — Z79899 Other long term (current) drug therapy: Secondary | ICD-10-CM

## 2011-05-13 DIAGNOSIS — Z7982 Long term (current) use of aspirin: Secondary | ICD-10-CM

## 2011-05-13 DIAGNOSIS — E861 Hypovolemia: Secondary | ICD-10-CM

## 2011-05-13 DIAGNOSIS — R197 Diarrhea, unspecified: Secondary | ICD-10-CM

## 2011-05-13 DIAGNOSIS — E876 Hypokalemia: Secondary | ICD-10-CM | POA: Diagnosis present

## 2011-05-13 DIAGNOSIS — A0472 Enterocolitis due to Clostridium difficile, not specified as recurrent: Principal | ICD-10-CM | POA: Diagnosis present

## 2011-05-13 DIAGNOSIS — I252 Old myocardial infarction: Secondary | ICD-10-CM

## 2011-05-13 DIAGNOSIS — R1032 Left lower quadrant pain: Secondary | ICD-10-CM

## 2011-05-13 DIAGNOSIS — Z96619 Presence of unspecified artificial shoulder joint: Secondary | ICD-10-CM

## 2011-05-13 DIAGNOSIS — H8110 Benign paroxysmal vertigo, unspecified ear: Secondary | ICD-10-CM | POA: Diagnosis present

## 2011-05-13 LAB — CBC
Hemoglobin: 13.5 g/dL (ref 12.0–15.0)
MCH: 30.4 pg (ref 26.0–34.0)
MCHC: 35.2 g/dL (ref 30.0–36.0)
Platelets: 160 10*3/uL (ref 150–400)
RBC: 4.44 MIL/uL (ref 3.87–5.11)

## 2011-05-13 LAB — COMPREHENSIVE METABOLIC PANEL
ALT: 14 U/L (ref 0–35)
AST: 15 U/L (ref 0–37)
Albumin: 3 g/dL — ABNORMAL LOW (ref 3.5–5.2)
Alkaline Phosphatase: 125 U/L — ABNORMAL HIGH (ref 39–117)
Calcium: 8.6 mg/dL (ref 8.4–10.5)
Glucose, Bld: 221 mg/dL — ABNORMAL HIGH (ref 70–99)
Potassium: 3.1 mEq/L — ABNORMAL LOW (ref 3.5–5.1)
Sodium: 132 mEq/L — ABNORMAL LOW (ref 135–145)
Total Protein: 6.1 g/dL (ref 6.0–8.3)

## 2011-05-13 LAB — DIFFERENTIAL
Lymphocytes Relative: 7 % — ABNORMAL LOW (ref 12–46)
Lymphs Abs: 1 10*3/uL (ref 0.7–4.0)
Neutrophils Relative %: 84 % — ABNORMAL HIGH (ref 43–77)

## 2011-05-13 NOTE — Progress Notes (Signed)
This is a somewhat complicated 70 year old Caucasian female who presents with diarrhea, dehydration, and diffuse abdominal pain. She has had a history of recurrent diverticulitis with treatment of twice a day Cipro and Flagyl 4-6 weeks ago as per clinic note. At that time she was constipated, and was prescribed a MiraLax bowel prep. She was hospitalized with acute diverticulitis in November of 2011 with associated CT scan findings of acute diverticulitis. She underwent orthopedic surgery in May that seems to have exacerbated her GI problems. Currently she wishes to have partial colectomy because of her recurrent problems. There is no history of nausea and vomiting, upper gastrointestinal or hepatobiliary complaints. She does have essential hypertension and is on Toprol XL 25 mg a day  .Current Medications, Allergies, Past Medical History, Past Surgical History, Family History and Social History were reviewed in [employer] record.  Pertinent Review of Systems Negative... she denies current cardiovascular, pulmonary, genitourinary or neurological problems appear Past Medical History  Diagnosis Date  . CAD (coronary artery disease)   . CVA (cerebral vascular accident)   . Myocardial infarct   . Other emphysema   . Unspecified arthropathy, shoulder region   . Gastric ulcer, unspecified as acute or chronic, without mention of hemorrhage, perforation, or obstruction   . Benign paroxysmal positional vertigo   . COPD (chronic obstructive pulmonary disease)   . Hypertension   . Diverticula of colon   . Osteopenia   . Personal history of colonic polyps 2007    tubular adenoma high grade dysplasia  . Diverticulitis   . Hyperlipemia    Past Surgical History  Procedure Date  . Total abdominal hysterectomy w/ bilateral salpingoophorectomy   . Angioplasty   . Appendectomy   . Total shoulder arthroplasty 2012    left  . Orif ankle fracture 2007    right  . Breast enhancement  surgery     bilateral  . Martial megeti     bladder  . Varicose vein surgery     reports that she quit smoking about 13 years ago. She has never used smokeless tobacco. She reports that she drinks alcohol. She reports that she does not use illicit drugs. family history includes Breast cancer in her sisters; Colon polyps in her sister; Diabetes in her maternal uncle and paternal grandmother; Heart disease in her father, maternal grandfather, mother, and paternal grandfather; Pancreatic cancer in her other; and Uterine cancer in her paternal grandmother.  There is no history of Colon cancer. Allergies  Allergen Reactions  . Lexapro     N/v, hot flashes, SOB  . Codeine     REACTION: NAUSEA-HAPPENED IN 20'S-PATIENT NOT SURE REALLY ALLERGIC  . Sulfonamide Derivatives        Physical Exam: Awake and alert no acute distress. She does appear dehydrated with decreased skin turgor and dry mucous membranes. Blood pressure is 80/40 and pulse is 100 and regular. She is afebrile. Chest is generally clear, and she is in a regular rhythm without murmurs gallops or rubs. Her abdomen shows no significant distention, or organomegaly. There is diffuse abdominal tenderness mostly in the left lower quadrant without rebound. Rectal exam shows no masses or tenderness with soft stool which is guaiac negative. Peripheral extremities otherwise were unremarkable. Mental status is normal without focal neurological deficits.    Assessment and Plan: Rule out relapse of C. difficile colitis versus relapse of her diverticulitis, associated abscess, or fistulization. I will hospitalize her for IV fluid replacement, stool exam for C. difficile  by PCR, stool culture, labs, and repeat CT scan of the abdomen and pelvis. Also medications for pain, sleep, anxiety, and nausea ordered. This patient probably will need surgical consultation during this hospitalization. No diagnosis found.

## 2011-05-14 ENCOUNTER — Inpatient Hospital Stay (HOSPITAL_COMMUNITY): Payer: Medicare Other

## 2011-05-14 DIAGNOSIS — R109 Unspecified abdominal pain: Secondary | ICD-10-CM

## 2011-05-14 DIAGNOSIS — R933 Abnormal findings on diagnostic imaging of other parts of digestive tract: Secondary | ICD-10-CM

## 2011-05-14 DIAGNOSIS — A0472 Enterocolitis due to Clostridium difficile, not specified as recurrent: Secondary | ICD-10-CM

## 2011-05-14 LAB — CLOSTRIDIUM DIFFICILE BY PCR: Toxigenic C. Difficile by PCR: POSITIVE — AB

## 2011-05-14 LAB — URINALYSIS, ROUTINE W REFLEX MICROSCOPIC
Bilirubin Urine: NEGATIVE
Glucose, UA: NEGATIVE mg/dL
Hgb urine dipstick: NEGATIVE
Ketones, ur: 15 mg/dL — AB
Nitrite: NEGATIVE
Specific Gravity, Urine: 1.018 (ref 1.005–1.030)
pH: 6.5 (ref 5.0–8.0)

## 2011-05-14 LAB — URINE MICROSCOPIC-ADD ON

## 2011-05-15 DIAGNOSIS — R933 Abnormal findings on diagnostic imaging of other parts of digestive tract: Secondary | ICD-10-CM

## 2011-05-15 DIAGNOSIS — R109 Unspecified abdominal pain: Secondary | ICD-10-CM

## 2011-05-15 DIAGNOSIS — A0472 Enterocolitis due to Clostridium difficile, not specified as recurrent: Secondary | ICD-10-CM

## 2011-05-15 LAB — BASIC METABOLIC PANEL
BUN: 10 mg/dL (ref 6–23)
Creatinine, Ser: 0.69 mg/dL (ref 0.50–1.10)
GFR calc non Af Amer: >60 mL/min (ref >60)
Glucose, Bld: 132 mg/dL — ABNORMAL HIGH (ref 70–99)
Potassium: 4.9 mEq/L (ref 3.5–5.1)

## 2011-05-17 LAB — STOOL CULTURE

## 2011-05-18 ENCOUNTER — Emergency Department (HOSPITAL_COMMUNITY)
Admission: EM | Admit: 2011-05-18 | Discharge: 2011-05-19 | Disposition: A | Discharge status: Home / Self Care | Payer: Medicare Other | Attending: Emergency Medicine | Admitting: Emergency Medicine

## 2011-05-18 DIAGNOSIS — Z8673 Personal history of transient ischemic attack (TIA), and cerebral infarction without residual deficits: Secondary | ICD-10-CM | POA: Insufficient documentation

## 2011-05-18 DIAGNOSIS — N390 Urinary tract infection, site not specified: Secondary | ICD-10-CM | POA: Insufficient documentation

## 2011-05-18 DIAGNOSIS — Z8614 Personal history of Methicillin resistant Staphylococcus aureus infection: Secondary | ICD-10-CM | POA: Insufficient documentation

## 2011-05-18 DIAGNOSIS — Z8619 Personal history of other infectious and parasitic diseases: Secondary | ICD-10-CM | POA: Insufficient documentation

## 2011-05-18 DIAGNOSIS — R5381 Other malaise: Secondary | ICD-10-CM | POA: Insufficient documentation

## 2011-05-18 DIAGNOSIS — R197 Diarrhea, unspecified: Secondary | ICD-10-CM | POA: Insufficient documentation

## 2011-05-18 DIAGNOSIS — E876 Hypokalemia: Secondary | ICD-10-CM | POA: Insufficient documentation

## 2011-05-18 DIAGNOSIS — I252 Old myocardial infarction: Secondary | ICD-10-CM | POA: Insufficient documentation

## 2011-05-18 LAB — POCT I-STAT, CHEM 8
BUN: 13 mg/dL (ref 6–23)
Hemoglobin: 11.6 g/dL — ABNORMAL LOW (ref 12.0–15.0)
Potassium: 3.1 mEq/L — ABNORMAL LOW (ref 3.5–5.1)
Sodium: 139 mEq/L (ref 135–145)
TCO2: 25 mmol/L (ref 0–100)

## 2011-05-18 LAB — URINE MICROSCOPIC-ADD ON

## 2011-05-18 LAB — URINALYSIS, ROUTINE W REFLEX MICROSCOPIC
Glucose, UA: NEGATIVE mg/dL
Hgb urine dipstick: NEGATIVE
Protein, ur: 30 mg/dL — AB

## 2011-05-18 LAB — CBC
Platelets: 226 10*3/uL (ref 150–400)
RDW: 13.8 % (ref 11.5–15.5)
WBC: 7 10*3/uL (ref 4.0–10.5)

## 2011-05-19 LAB — DIFFERENTIAL
Basophils Absolute: 0 10*3/uL (ref 0.0–0.1)
Eosinophils Absolute: 0.1 10*3/uL (ref 0.0–0.7)
Lymphocytes Relative: 22 % (ref 12–46)
Neutrophils Relative %: 60 % (ref 43–77)

## 2011-05-20 LAB — URINE CULTURE: Culture  Setup Time: 201207300940

## 2011-05-23 ENCOUNTER — Telehealth: Payer: Self-pay | Admitting: Gastroenterology

## 2011-05-23 DIAGNOSIS — R197 Diarrhea, unspecified: Secondary | ICD-10-CM

## 2011-05-26 NOTE — Telephone Encounter (Signed)
lmom for pt to call back. Pt seen 05/13/11 and hospitalized for diarrhea, dehydration, diffuse abdominal pain- + CDIFF. Back to the ER 05/18/11 for UTI and given Macrobid.

## 2011-05-26 NOTE — Telephone Encounter (Signed)
Pt reports she got Vancomycin in the hospital, liquid that was squirted into a medicine cup, for approximately 3 days . When she was discharged, she wasn't given any scripts. Before she got home, pt stated she sprayed diarrhea everywhere in the car. Progress notes from 05/16/11 stated pt would go home on Vanc for a total of 14 days. She went back to the ER late on 05/18/11 and was dx with an UTI and given IV Rocephin and fluids. Pt was given a script for Macrobid, but it was too expensive and she called and they changed it to K flex ? She states she had diarrhea Friday, 05/23/11, when she called [place]. Saturday and Sunday she felt better and today she's ok. Pt stated she's afraid to eat, but ate poached eggs and toast today and she's drinking lots of Gatorade. Pt scheduled to come in tomorrow at 1:45pm. I asked her to get a specimen cup for another CDIFF; do you think she needs another one? Thanks.

## 2011-05-26 NOTE — Telephone Encounter (Signed)
lmom to call again- missed her call.

## 2011-05-27 ENCOUNTER — Ambulatory Visit: Payer: Medicare Other | Admitting: Gastroenterology

## 2011-05-27 ENCOUNTER — Telehealth: Payer: Self-pay | Admitting: *Deleted

## 2011-05-27 NOTE — Telephone Encounter (Signed)
i agree. 

## 2011-05-27 NOTE — Telephone Encounter (Signed)
Pt stated she had to r/s because she hasn't has any sleep and can't drive. She was given a 06/06/11 appt; informed her she has to be seen; will come tomorrow at 1:45pm.

## 2011-05-28 ENCOUNTER — Ambulatory Visit (INDEPENDENT_AMBULATORY_CARE_PROVIDER_SITE_OTHER): Payer: Medicare Other | Admitting: Gastroenterology

## 2011-05-28 ENCOUNTER — Other Ambulatory Visit: Payer: Medicare Other

## 2011-05-28 ENCOUNTER — Encounter: Payer: Self-pay | Admitting: Gastroenterology

## 2011-05-28 VITALS — BP 122/74 | HR 64 | Ht 64.0 in | Wt 156.0 lb

## 2011-05-28 DIAGNOSIS — R197 Diarrhea, unspecified: Secondary | ICD-10-CM

## 2011-05-28 DIAGNOSIS — K5730 Diverticulosis of large intestine without perforation or abscess without bleeding: Secondary | ICD-10-CM

## 2011-05-28 DIAGNOSIS — A0472 Enterocolitis due to Clostridium difficile, not specified as recurrent: Secondary | ICD-10-CM

## 2011-05-28 MED ORDER — SACCHAROMYCES BOULARDII 250 MG PO CAPS: 250.0000 mg | ORAL_CAPSULE | Freq: Two times a day (BID) | ORAL | Status: DC

## 2011-05-28 MED ORDER — SACCHAROMYCES BOULARDII 250 MG PO CAPS: 250.0000 mg | ORAL_CAPSULE | Freq: Two times a day (BID) | ORAL | Status: AC

## 2011-05-28 NOTE — Patient Instructions (Signed)
We have sent a rx for Florastor 250mg  take one twice a day.  We will call you when your Diff culture comes back about treatment

## 2011-05-28 NOTE — Telephone Encounter (Signed)
Error

## 2011-05-28 NOTE — Progress Notes (Signed)
This is a 70 year old Caucasian female admitted approximately one month ago with C. difficile colitis. She was treated with 5 days of vancomycin, but did not receive a prescription upon discharge. He is continuing with diarrhea, and recently was in the [place] emergency room and diagnosed with UTI treated with a cephalosporin which she has taken for the last week. She continues with general malaise, vague abdominal discomfort, and small amounts of nonbloody diarrhea. Has long history of diverticulosis, and has had 3 attacks of diverticulitis in the last year requiring antibiotic use which has led to C. difficile superinfection. She has repeat stool exam for C. difficile toxin by PCR pending. Review of her previous colonoscopy report does show extensive diverticulosis and removal of a dysplastic polyp. She is due for followup colonoscopy this year.  Current Medications, Allergies, Past Medical History, Past Surgical History, Family History and Social History were reviewed in [employer] record.  Pertinent Review of Systems Negative.. she denies current genitourinary complaints.   Physical Exam: Awake and alert no acute distress appears stated age. Abdominal exam shows no organomegaly, distention, masses or tenderness. Bowel sounds are normal.    Assessment and Plan: C. difficile colitis inadequately treated. I placed her of Florstar twice a day pending C. difficile toxin assay. If this is positive we will treat her with tapering doses of vancomycin over the next month with followup in 4-6 weeks' time. She will need followup colonoscopy once this problem has been addressed. The patient is very interested in laparoscopic sigmoid resection surgery. After reviewing her records, I would agree that this seems like an appropriate course of action. No diagnosis found.

## 2011-05-29 ENCOUNTER — Telehealth: Payer: Self-pay | Admitting: *Deleted

## 2011-05-29 ENCOUNTER — Other Ambulatory Visit: Payer: Self-pay | Admitting: *Deleted

## 2011-05-29 MED ORDER — FLUTICASONE-SALMETEROL 250-50 MCG/DOSE IN AEPB: 1.0000 | INHALATION_SPRAY | Freq: Two times a day (BID) | RESPIRATORY_TRACT | Status: DC

## 2011-05-29 NOTE — Telephone Encounter (Signed)
Pt called for results of her CDIFF and informed me she hasn't had a BM since Sunday. Informed pt the results are not back- I will call her when Dr Jarold Motto has reviewed them. Advised her to take OTC Miralax per bottle daily; pt stated understanding.

## 2011-05-30 ENCOUNTER — Ambulatory Visit: Payer: Medicare Other | Admitting: Gastroenterology

## 2011-05-30 ENCOUNTER — Telehealth: Payer: Self-pay | Admitting: *Deleted

## 2011-05-30 NOTE — Telephone Encounter (Signed)
Informed pt to take Benefiber daily and Miralax PRN. Pt asked what to do if she doesn't have a BM by tomorrow, I instructed her to call the MD on call; pt stated understanding.

## 2011-05-30 NOTE — Telephone Encounter (Signed)
lmom for pt to call back. Continue Florastor bid see Dr Jarold Motto in 1 month to discuss COLON.

## 2011-05-30 NOTE — Telephone Encounter (Signed)
Message copied by Florene Glen on Fri May 30, 2011 11:40 AM ------      Message from: Jarold Motto, DAVID R      Created: Fri May 30, 2011  8:40 AM       c diff negative.repeat. If diarrhea returns..The current medical regimen is effective;  continue present plan and medications. Florstar bid,,,see me 1 month to discuss colonoscopy

## 2011-05-30 NOTE — Telephone Encounter (Signed)
BENEFIBER DAILY AND PRN MIRALAX .Marland KitchenMarland Kitchen

## 2011-05-30 NOTE — Telephone Encounter (Signed)
Informed pt to continue the current medical regimen of Florastar bid see Dr Jarold Motto 06/26/11, 11:30am. Pt still hasn't had a BM since Sunday; she has taken Miralax QHS.  Dr Jarold Motto, what should she do for a BM w/o causing diarrhea- BID Miralax? Please advise.

## 2011-05-30 NOTE — Telephone Encounter (Signed)
Message copied by Natnael Biederman K on Fri May 30, 2011 11:40 AM ------      Message from: PATTERSON, DAVID R      Created: Fri May 30, 2011  8:40 AM       c diff negative.repeat. If diarrhea returns..The current medical regimen is effective;  continue present plan and medications. Florstar bid,,,see me 1 month to discuss colonoscopy 

## 2011-05-31 NOTE — Discharge Summary (Signed)
[name], RAUEN NO.:  [id number]  MEDICAL RECORD NO.:  [id number]  LOCATION:  4501                         FACILITY:  [facility]  PHYSICIAN:  Judie Petit T. Russella Dar, MD, FACGDATE OF BIRTH:  [birth year]  DATE OF ADMISSION:  05/13/2011 DATE OF DISCHARGE:  05/17/2011                              DISCHARGE SUMMARY   ADMITTING DIAGNOSES: 1. Diarrhea and abdominal pain, rule out Clostridium difficile, rule     out complications of diverticulitis. 2. History of recurrent diverticulitis, first episode in November     2009, second episode in January 2011.  Ultimately, will likely need     sigmoid resection, but has never been able to follow up with     surgeons. 3. Coronary artery disease. 4. History of cerebrovascular accident. 5. History of myocardial infarction. 6. Emphysema, chronic obstructive pulmonary disease. 7. Degenerative joint disease. 8. History of gastric ulcer. 9. Benign paroxysmal positional vertigo. 10.Hypertension. 11.Osteopenia. 12.History of colon polyps with a tubular adenoma, displaying high-     grade dysplasia in 2007, has yet to undergo repeat colonoscopy. 13.Hyperlipidemia. 14.History of candidal esophagitis, nonbleeding antral ulcer and     duodenal polyp on endoscopy in 2009. 15.Prior surgeries include:     a.     Total abdominal hysterectomy with bilateral salpingo-      oophorectomy.     b.     Cardiac angioplasty.     c.     Appendectomy.     d.     Left shoulder replacement on December 05, 2010.     e.     History of right ankle fracture repair.     f.     Status post bilateral breast enhancement.     g.     Status post bladder surgery, Marshall-Marchetti.     h.     Varicose vein surgery.  DISCHARGE DIAGNOSES: 1. Diarrhea.  Her Clostridium difficile PCR was positive, diarrhea is     resolving with oral vancomycin. 2. Chronic sigmoid diverticulitis.  Ultimately needs surgical referral     and likely sigmoid colectomy when she is  convalesced from current     colitis. 3. Anxiety. 4. Asthma and chronic obstructive pulmonary disease, stable during     admission, she uses home nocturnal oxygen supplementation. 5. Hyponatremia, corrected. 6. Hypokalemia, corrected. 7. Hyperglycemia.  The patient does not have a personal history of     glucose intolerance.  BRIEF HISTORY:  [name] is a 70 year old woman who has been followed by [facility] since she developed sigmoid diverticulitis in November 2011.  She received a course of antibiotics for that then.  She had ongoing pain in the right side which got worse.  In January 2012, she had a second CT scan which confirmed again the presence of proximal sigmoid diverticulitis versus acute colitis.  She was treated with 4-6 weeks of oral therapy with Cipro and Flagyl.  The patient presented to Dr. Norval Gable office with ongoing but worsening lower abdominal pain and several days' worth of watery, frequent, nonbloody bowel movements. Dr. Jarold Motto was concerned that she may have developed Clostridiumdifficile.  He had never got to the point where he had seen  her convalesced after treatment for diverticulitis, so she has not had followup colonoscopy.  She does have a history of high-grade dysplasia and a tubular adenoma based on colonoscopy in December 2007.  She reported 10 pounds of weight loss over the last week or so, owing to poor appetite, anorexia, as well as the diarrhea.  She did not have anything in the way of nausea or vomiting.  The patient was admitted to the hospital from Dr. Norval Gable office.  CONSULTATIONS:  None.  LABS:  Hemoglobin 13.5, hematocrit 38.3, MCV 86.3, white blood cell count 14.6, platelet count 160.  Differential revealed left shift. Sodium initially 132, corrected to 135.  Potassium initially 3.1, corrected to 4.9.  Chloride 109.  CO2 19.  Glucose ranged 132-221.  BUN initially 29, corrected to 10.  Creatinine ranged from 0.89 to  0.69. Total bilirubin 0.6, alkaline phosphatase 125, AST 15, ALT 14.  Albumin 3.0.  Urinalysis was contaminated by multiple epithelial cells, but only displayed 0-2 white blood cells per high-powered field, trace leukocyte esterase present and no urine nitrites present.  Stool culture:  No Salmonella, Shigella, Campylobacter, Yersinia isolated.  Clostridium difficile PCR test was positive on May 14, 2011.  IMAGING:  CT scan of the abdomen and pelvis without contrast material on May 14, 2011.  This showed fat stranding in the pericolonic region at the sigmoid colon.  The bowel wall thickening similar in comparison to the CT of November 2011, which suggests acute versus chronic diverticulitis.  No evidence for perforation or abscess.  HOSPITAL COURSE:  The patient was admitted to the hospital and Clostridium difficile assay returned positive.  She was immediately started on vancomycin 125 mg every 6 hours on May 14, 2011.  She was continued on IV fluids along with a clear liquid diet.  Electrolytes including low sodium and low potassium were corrected.  The patient turned around within 24 hours of starting the vancomycin.  Stool frequency was drastically improved.  Given the patient's complaints of ongoing chronic right-sided abdominal pain, there is probability that the patient has not only acute Clostridium difficile infection, but also chronic sigmoid diverticulitis.  The patient's pain was controlled with both OxyContin and p.r.n. oxycodone.  By May 15, 2011, she had not had any bowel movements over about 20 hours.  The patient was fearful that she was turning back to her usual constipated state which is more the chronic condition for her.  The patient's diet was advanced to a low-residue diet.  She was eating well, 100% of the trays she was offered.  Abdominal exam displayed reduced tenderness.  She did not ever have any guarding or rebound.  She was discharged home on May 17, 2011, in stable and improved condition with plans to complete 11 more days of oral vancomycin.  She was to follow a low-fiber diet.  She had a followup appointment with Dr. Sheryn Bison on May 30, 2011.  At that return office visit, it is possible she could be referred on to General Surgery for evaluation of her chronic sigmoid diverticulitis.  MEDICATIONS AT DISCHARGE: 1. Vancomycin 125 mg p.o. q.6 hours for 11 days. 2. Loratadine OTC 1 tablet p.o. q.a.m. 3. Aspirin 81 mg p.o. daily. 4. Advair Diskus inhaler 250/50 one puff inhaled twice daily. 5. Calcium 600 mg. 6. Gelcap over the counter 1 capsule daily. 7. Celebrex 200 mg 1 p.o. b.i.d. 8. Over-the-counter cranberry supplement 1 tablet p.o. at bedtime. 9. Dexilant 60 mg p.o. daily. 10.Colace 100 mg  1 capsule p.o. q.a.m., to be held if she had     diarrhea. 11.Fish oil 1200 mg over-the-counter preparation 1 capsule q.a.m. 12.Fosamax 70 mg 1 tablet weekly. 13.Gentamicin ophthalmic solution 0.3% 1 drop to left eye 4 times     daily. 14.L-lysine 500 mg over-the-counter preparation 1 tablet every other     day. 15.Metoprolol XL 25 mg 1 tablet p.o. at bedtime. 16.Multivitamins 1/2 tablet by mouth twice daily. 17.Sublingual nitroglycerin 0.4 mg 1 tablet under tongue every 5     minutes up to 3 times p.r.n. chest pain. 18.Odorless garlic supplement 1 capsule every other day. 19.Albuterol inhaler 2 puffs inhaled twice daily as needed. 20.Simvastatin 40 mg p.o. at bedtime. 21.Spiriva inhaler 18 mcg 1 capsule inhaled q.a.m. 22.Study medication (IRIS) 1 tablet by mouth daily at bedtime.  Please     note this was held during her hospital stay. 23.Tramadol 50 mg p.o. b.i.d. 24.Vitamin D 2000 units 1 tablet p.o. b.i.d.  CONDITION AT DISCHARGE:  Stable and improved.  DISCHARGE DIET:  Low fiber/low residue.     Jennye Moccasin, PA-C   ______________________________ Venita Lick. Russella Dar, MD, [facility]    SG/MEDQ  D:  05/26/2011   T:  05/27/2011  Job:  [id number]  cc:   Vania Rea. Jarold Motto, MD, [facility], FAGA  Electronically Signed by Jennye Moccasin PA-C on 05/27/2011 03:34:30 PM Electronically Signed by Claudette Head MD FACG on 05/31/2011 09:20:54 PM

## 2011-06-03 ENCOUNTER — Telehealth: Payer: Self-pay | Admitting: Gastroenterology

## 2011-06-03 ENCOUNTER — Ambulatory Visit: Payer: Medicare Other | Admitting: Gastroenterology

## 2011-06-03 NOTE — Telephone Encounter (Signed)
yes

## 2011-06-03 NOTE — Telephone Encounter (Signed)
Pt called to report she went 9 days w/o a BM. She took Miralax and Benefiber daily and only went after a bottle of Mag Citrate. She requested her OV be moved up- changed to 06/13/11 at 1045. Pt would like for me to go ahead and schedule the COLON and she would like a referral to Dr Johna Sheriff. She will be out of town 06/15/11-06/22/11 and would like everything including surgery done by October, 2012. Ok to schedule COLON and referral to [facility]? Thanks.

## 2011-06-04 NOTE — Telephone Encounter (Signed)
Dr Jarold Motto, I scheduled the pt to see you per your last note, then for her COLON and then to see Dr Johna Sheriff for possible COLECTOMY. Does pt need to see you or is a DIRECT ok with a Pre Visit? Thanks.

## 2011-06-04 NOTE — Telephone Encounter (Signed)
[facility] IS OK 

## 2011-06-06 ENCOUNTER — Ambulatory Visit: Payer: Medicare Other | Admitting: Gastroenterology

## 2011-06-11 ENCOUNTER — Telehealth: Payer: Self-pay | Admitting: Gastroenterology

## 2011-06-11 ENCOUNTER — Other Ambulatory Visit (INDEPENDENT_AMBULATORY_CARE_PROVIDER_SITE_OTHER): Payer: Medicare Other

## 2011-06-11 DIAGNOSIS — R300 Dysuria: Secondary | ICD-10-CM

## 2011-06-11 DIAGNOSIS — M549 Dorsalgia, unspecified: Secondary | ICD-10-CM

## 2011-06-11 LAB — URINALYSIS, ROUTINE W REFLEX MICROSCOPIC
Specific Gravity, Urine: >=1.03 (ref 1.000–1.030)
Urine Glucose: NEGATIVE
Urobilinogen, UA: 0.2 (ref 0.0–1.0)

## 2011-06-11 NOTE — Telephone Encounter (Signed)
Is it ok to order a UA and C&S? Thanks.

## 2011-06-11 NOTE — Telephone Encounter (Signed)
Notified pt I put in the lab and she can come in at her convenience for the urine test; pt stated understanding.

## 2011-06-11 NOTE — Telephone Encounter (Signed)
OK,CLEAN CATCH FOR CULTURE AND UA

## 2011-06-12 ENCOUNTER — Ambulatory Visit: Payer: Medicare Other | Admitting: Gastroenterology

## 2011-06-12 ENCOUNTER — Encounter: Payer: Self-pay | Admitting: Cardiovascular Disease

## 2011-06-12 ENCOUNTER — Ambulatory Visit: Payer: Medicare Other

## 2011-06-12 ENCOUNTER — Telehealth: Payer: Self-pay | Admitting: *Deleted

## 2011-06-12 DIAGNOSIS — M549 Dorsalgia, unspecified: Secondary | ICD-10-CM

## 2011-06-12 DIAGNOSIS — R300 Dysuria: Secondary | ICD-10-CM

## 2011-06-12 NOTE — Telephone Encounter (Signed)
Pt aware the lab will ad on culture to urine left today. She can discuss if she has a bladder infection with DRP when she comes in for her office visit.

## 2011-06-12 NOTE — Telephone Encounter (Signed)
Message copied by Leonette Monarch on Thu Jun 12, 2011  8:36 AM ------      Message from: Jarold Motto, DAVID R      Created: Thu Jun 12, 2011  8:29 AM       Need culture

## 2011-06-13 ENCOUNTER — Encounter: Payer: Self-pay | Admitting: Gastroenterology

## 2011-06-13 ENCOUNTER — Ambulatory Visit (INDEPENDENT_AMBULATORY_CARE_PROVIDER_SITE_OTHER): Payer: Medicare Other | Admitting: Gastroenterology

## 2011-06-13 VITALS — BP 118/66 | HR 64 | Ht 64.5 in | Wt 156.0 lb

## 2011-06-13 DIAGNOSIS — K5730 Diverticulosis of large intestine without perforation or abscess without bleeding: Secondary | ICD-10-CM

## 2011-06-13 DIAGNOSIS — K529 Noninfective gastroenteritis and colitis, unspecified: Secondary | ICD-10-CM

## 2011-06-13 DIAGNOSIS — K5289 Other specified noninfective gastroenteritis and colitis: Secondary | ICD-10-CM

## 2011-06-13 DIAGNOSIS — M549 Dorsalgia, unspecified: Secondary | ICD-10-CM

## 2011-06-13 MED ORDER — PEG-KCL-NACL-NASULF-NA ASC-C 100 G PO SOLR: 1.0000 | Freq: Once | ORAL | Status: DC

## 2011-06-13 NOTE — Progress Notes (Signed)
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 difficile. Complains of back pain which she feels is from a UTI. Urinalysis showed a few white cells but otherwise was fairly unremarkable. Urine culture is pending. She denies left lower quadrant pain, rectal bleeding, or other systemic complaints such as fever chills. She is allergic to sulfa medication.  Current Medications, Allergies, Past Medical History, Past Surgical History, Family History and Social History were reviewed in [employer] record.   Pertinent Review of Systems: Negative     Physical Exam: Awake alert no acute distress. Abdominal exam is unremarkable as is back exam. There is no significant CVA tenderness. She does not appear to be systemically ill. Mental status is normal.     Assessment and Plan: Await urine culture before antibiotic initiation because of her history of C. difficile colitis. I scheduled followup colonoscopy and surgical consultation for her recurrent diverticulitis. She may need preoperative urologic evaluation. Otherwise she is continuing medications as listed and reviewed her record. Encounter Diagnosis  Name Primary?  . Colitis Yes

## 2011-06-13 NOTE — Patient Instructions (Signed)
Your procedure has been scheduled for 06/27/2011, please follow the seperate instructions.  Your prescription(s) have been sent to you pharmacy.

## 2011-06-14 LAB — URINE CULTURE: Colony Count: 40000

## 2011-06-16 ENCOUNTER — Other Ambulatory Visit: Payer: Medicare Other | Admitting: Gastroenterology

## 2011-06-16 ENCOUNTER — Telehealth: Payer: Self-pay | Admitting: *Deleted

## 2011-06-16 NOTE — Telephone Encounter (Signed)
Left message on vm patient does not need antibiotic she can call back if any questions.

## 2011-06-16 NOTE — Telephone Encounter (Signed)
Message copied by Leonette Monarch on Mon Jun 16, 2011  9:09 AM ------      Message from: Jarold Motto, DAVID R      Created: Mon Jun 16, 2011  8:33 AM       DOES NOT NEED ANTIBIOTIC

## 2011-06-18 ENCOUNTER — Ambulatory Visit (INDEPENDENT_AMBULATORY_CARE_PROVIDER_SITE_OTHER): Payer: Self-pay | Admitting: General Surgery

## 2011-06-26 ENCOUNTER — Ambulatory Visit: Payer: Medicare Other | Admitting: Gastroenterology

## 2011-06-27 ENCOUNTER — Ambulatory Visit (AMBULATORY_SURGERY_CENTER): Payer: Medicare Other | Admitting: Gastroenterology

## 2011-06-27 ENCOUNTER — Encounter: Payer: Self-pay | Admitting: Gastroenterology

## 2011-06-27 ENCOUNTER — Other Ambulatory Visit: Payer: Self-pay | Admitting: Gastroenterology

## 2011-06-27 VITALS — BP 113/46 | HR 84 | Temp 99.3°F | Resp 17 | Ht 65.4 in | Wt 156.0 lb

## 2011-06-27 DIAGNOSIS — K5289 Other specified noninfective gastroenteritis and colitis: Secondary | ICD-10-CM

## 2011-06-27 DIAGNOSIS — R1084 Generalized abdominal pain: Secondary | ICD-10-CM

## 2011-06-27 DIAGNOSIS — C189 Malignant neoplasm of colon, unspecified: Secondary | ICD-10-CM | POA: Insufficient documentation

## 2011-06-27 DIAGNOSIS — K529 Noninfective gastroenteritis and colitis, unspecified: Secondary | ICD-10-CM

## 2011-06-27 MED ORDER — SODIUM CHLORIDE 0.9 % IV SOLN: 500.0000 mL | INTRAVENOUS | Status: DC

## 2011-06-27 MED ORDER — CLONAZEPAM 1 MG PO TABS: 1.0000 mg | ORAL_TABLET | Freq: Two times a day (BID) | ORAL | Status: DC | PRN

## 2011-06-27 NOTE — Patient Instructions (Signed)
RESUME ALL MEDICATIONS. D/C INFORMATION COMPLETED. 

## 2011-06-27 NOTE — Progress Notes (Signed)
1626-Per MD need smaller scope to be able to move through colon. Procedure stopped for tech to get smaller scope. New scope #[id number]

## 2011-06-30 ENCOUNTER — Telehealth: Payer: Self-pay | Admitting: Gastroenterology

## 2011-06-30 ENCOUNTER — Telehealth: Payer: Self-pay

## 2011-06-30 ENCOUNTER — Encounter: Payer: Self-pay | Admitting: Gastroenterology

## 2011-06-30 NOTE — Telephone Encounter (Signed)
done

## 2011-06-30 NOTE — Telephone Encounter (Signed)
Left message on answering machine. 

## 2011-06-30 NOTE — Telephone Encounter (Signed)
i called her daughter

## 2011-07-01 ENCOUNTER — Telehealth: Payer: Self-pay | Admitting: Pulmonary Disease

## 2011-07-01 ENCOUNTER — Telehealth: Payer: Self-pay | Admitting: Gastroenterology

## 2011-07-01 ENCOUNTER — Encounter: Payer: Self-pay | Admitting: Cardiovascular Disease

## 2011-07-01 ENCOUNTER — Ambulatory Visit (INDEPENDENT_AMBULATORY_CARE_PROVIDER_SITE_OTHER): Payer: Medicare Other | Admitting: Cardiovascular Disease

## 2011-07-01 ENCOUNTER — Encounter: Payer: Self-pay | Admitting: Gastroenterology

## 2011-07-01 VITALS — BP 93/59 | HR 101 | Ht 64.0 in | Wt 155.8 lb

## 2011-07-01 DIAGNOSIS — I2510 Atherosclerotic heart disease of native coronary artery without angina pectoris: Secondary | ICD-10-CM

## 2011-07-01 NOTE — Telephone Encounter (Signed)
If surgery is urgent/emergent  - Ok to proceed with due risk Stay on advair & spiriva Use albuterol nebs q 4h prn wheezing They can call us post op if problems Pl fax this with my last OV to Dr [name]

## 2011-07-01 NOTE — Telephone Encounter (Signed)
Spoke with pt. She states having to have emergency surgery to have part of her colon removed and needs [place] to fax Dr [name] at [facility] letter of pulmonary clearance to have this done. I advised RA out of the office until 07/10/11 and will forward msg to him and call her back with response asap.

## 2011-07-01 NOTE — Telephone Encounter (Signed)
called

## 2011-07-01 NOTE — Progress Notes (Signed)
History of Present Illness:70 yo female with history of CAD s/p previous stenting procedures, CVA, ischemic cardiomyopathy, COPD who is here today for cardiac follow up. She has been followed in the past by Dr. Juanda Chance and was seen in December 2011 for surgical risk assessment prior to planned  She initially had an anterior MI in 1990  treated with TPA followed by PTCA. In 2010 in July she had a drug eluting stent to the circumflex artery for unstable angina.   She underwent catheterization 2010 for evaluation of SOB and was found to have nonobstructive coronary disease. Her stent in the circumflex artery was widely patent. Her ejection fraction was 45% and a pulmonary wedge pressure was normal. Her symptoms were felt to be secondary to her COPD. She has had a previous stroke treated with TPA in 2009. She also has severe obstructive pulmonary disease with an FEV1 of less than 1 L. Her Plavix has been stopped in the past.   She tells me that she had a colonscopy last week. The scope could not be passed easily secondary to obstruction. She has plans to see [facility] on Thursday and surgery is possible this week. No chest pain or change in SOB. No palpitations, near syncope, syncope. She is actively working without limitations. She is not limited by any fatigue.    Past Medical History  Diagnosis Date  . CAD (coronary artery disease)   . CVA (cerebral vascular accident)   . Myocardial infarct   . Other emphysema   . Unspecified arthropathy, shoulder region   . Gastric ulcer, unspecified as acute or chronic, without mention of hemorrhage, perforation, or obstruction   . Benign paroxysmal positional vertigo   . COPD (chronic obstructive pulmonary disease)   . Hypertension   . Diverticula of colon   . Osteopenia   . Personal history of colonic polyps 2007    tubular adenoma high grade dysplasia  . Diverticulitis   . Hyperlipemia   . Fracture of ankle, bimalleolar, right, closed   .  Hypoxemia   . Tubular adenoma   . Benign positional vertigo   . Arthritis     left shoulder  . Hx of colonoscopy     Past Surgical History  Procedure Date  . Total abdominal hysterectomy w/ bilateral salpingoophorectomy   . Angioplasty   . Total shoulder arthroplasty 2012    left  . Orif ankle fracture 2007    right  . Breast enhancement surgery     bilateral  . Martial megeti     bladder  . Varicose vein surgery   . Ptca     Current Outpatient Prescriptions  Medication Sig Dispense Refill  . albuterol (VENTOLIN HFA) 108 (90 BASE) MCG/ACT inhaler Inhale 2 puffs into the lungs every 6 (six) hours as needed.        Marland Kitchen alendronate (FOSAMAX) 70 MG tablet Take 70 mg by mouth every 7 (seven) days. Take with a full glass of water on an empty stomach.       Marland Kitchen aspirin 81 MG tablet Take 81 mg by mouth daily.        . calcium-vitamin D (OSCAL WITH D) 500-200 MG-UNIT per tablet Take 1 tablet by mouth daily.        [name] Sodium 30-100 MG CAPS Take 1 tablet by mouth daily.       . Cholecalciferol (VITAMIN D3) 2000 UNITS TABS Take 1 tablet by mouth 2 (two) times daily.        Marland Kitchen  Cranberry-Olive Leaf (URINARY TRACT HEALTH) 365-50 MG CAPS Take 1 capsule by mouth daily.        Marland Kitchen dexlansoprazole (DEXILANT) 60 MG capsule Take 60 mg by mouth daily.        Marland Kitchen docusate sodium (COLACE) 100 MG capsule Take 100 mg by mouth as needed.        . Fluticasone-Salmeterol (ADVAIR DISKUS) 250-50 MCG/DOSE AEPB Inhale 1 puff into the lungs every 12 (twelve) hours.  3 each  1  . Garlic (ODORLESS GARLIC) 1250 MG TABS Take 1 tablet by mouth every other day.       Marland Kitchen L-Lysine 500 MG TABS Take 1 tablet by mouth every other day.       . meloxicam (MOBIC) 15 MG tablet Take 15 mg by mouth daily.        . methocarbamol (ROBAXIN) 500 MG tablet Take 500 mg by mouth 2 (two) times daily as needed.        . metoprolol succinate (TOPROL-XL) 25 MG 24 hr tablet Take 25 mg by mouth at bedtime.       . Multiple  Vitamin (MULTIVITAMIN) capsule Take 0.5 capsules by mouth 2 (two) times daily.       . nitroGLYCERIN (NITROSTAT) 0.4 MG SL tablet Place 0.4 mg under the tongue every 5 (five) minutes as needed.        . Omega-3 Fatty Acids (FISH OIL) 1200 MG CAPS Take 1 capsule by mouth daily.       Marland Kitchen oxyCODONE (OXYCONTIN) 10 MG 12 hr tablet Take 0.5 mg by mouth every 12 (twelve) hours. As needed      . predniSONE (DELTASONE) 5 MG tablet Take 5 mg by mouth daily. As directed       . Probiotic Product (PROBIOTIC PO) Take 1 tablet by mouth daily.        . simvastatin (ZOCOR) 40 MG tablet Take 40 mg by mouth at bedtime.        Marland Kitchen tiotropium (SPIRIVA) 18 MCG inhalation capsule Place 18 mcg into inhaler and inhale daily.        . traMADol (ULTRAM) 50 MG tablet Take 50 mg by mouth 2 (two) times daily as needed.        . vitamin B-12 (CYANOCOBALAMIN) 1000 MCG tablet Take 1,000 mcg by mouth daily.        Marland Kitchen zolpidem (AMBIEN) 5 MG tablet Take 5 mg by mouth at bedtime as needed.         Current Facility-Administered Medications  Medication Dose Route Frequency Provider Last Rate Last Dose  . 0.9 %  sodium chloride infusion  500 mL Intravenous Continuous Sheryn Bison, MD        Allergies  Allergen Reactions  . Lexapro     N/v, hot flashes, SOB  . Codeine     REACTION: NAUSEA-HAPPENED IN 20'S-PATIENT NOT SURE REALLY ALLERGIC  . Keflex     cdiff  . Sulfonamide Derivatives     History   Social History  . Marital Status: Single    Spouse Name: N/A    Number of Children: N/A  . Years of Education: N/A   Occupational History  . Realtor [employer]   Social History Main Topics  . Smoking status: Former Smoker    Quit date: 10/20/1997  . Smokeless tobacco: Never Used  . Alcohol Use: Yes     occasionally  . Drug Use: No  . Sexually Active: Not on file   Other Topics Concern  .  Not on file   Social History Narrative  . No narrative on file    Family History  Problem Relation Age of Onset  .  Breast cancer Sister   . Colon cancer Neg Hx   . Diabetes Paternal Grandmother   . Uterine cancer Paternal Grandmother   . Heart disease Mother   . Heart disease Father   . Breast cancer Sister   . Pancreatic cancer Other     2nd cousin  . Colon polyps Sister   . Diabetes Maternal Uncle   . Heart disease Maternal Grandfather   . Heart disease Paternal Grandfather     Review of Systems:  As stated in the HPI and otherwise negative.   BP 93/59  Pulse 101  Ht 5\' 4"  (1.626 m)  Wt 155 lb 12.8 oz (70.67 kg)  BMI 26.74 kg/m2  Physical Examination: General: Well developed, well nourished, NAD HEENT: OP clear, mucus membranes moist SKIN: warm, dry. No rashes. Neuro: No focal deficits Musculoskeletal: Muscle strength 5/5 all ext Psychiatric: Mood and affect normal Neck: No JVD, no carotid bruits, no thyromegaly, no lymphadenopathy. Lungs:Clear bilaterally, no wheezes, rhonci, crackles Cardiovascular: Regular rate and rhythm. No murmurs, gallops or rubs. Abdomen:Soft. Bowel sounds present. Non-tender.  Extremities: No lower extremity edema. Pulses are 2 + in the bilateral DP/PT.  EKG:NSR, rate 96 bpm. Old septal infarct.

## 2011-07-01 NOTE — Assessment & Plan Note (Signed)
Stable. No angina, SOB or signs of CHF. Per current ACC/AHA guideliens, she can proceed with planned surgical procedure without further cardiac workup. Continue current meds.

## 2011-07-01 NOTE — Patient Instructions (Signed)
Your physician wants you to follow-up in: January 2013 You will receive a reminder letter in the mail two months in advance. If you don't receive a letter, please call our office to schedule the follow-up appointment.  

## 2011-07-01 NOTE — Telephone Encounter (Signed)
[email or web address] I spoke with Shanda Bumps R and she states she is going to fax over last OV note and form for surgery clearance to Dr. [name]

## 2011-07-01 NOTE — Telephone Encounter (Addendum)
Patient states that no one spoke to her daughter and she would like result. She is asking for a call back to her. Per phone note on 06/30/2011 Dr Jarold Motto called daughter, per Dr Jarold Motto he spoke with daughter

## 2011-07-02 ENCOUNTER — Telehealth: Payer: Self-pay | Admitting: Pulmonary Disease

## 2011-07-02 MED ORDER — ALBUTEROL SULFATE HFA 108 (90 BASE) MCG/ACT IN AERS: 2.0000 | INHALATION_SPRAY | Freq: Four times a day (QID) | RESPIRATORY_TRACT | Status: DC | PRN

## 2011-07-02 NOTE — Telephone Encounter (Signed)
LMOMTCB to verify what medication is needed to be sent to pharmacy

## 2011-07-02 NOTE — Telephone Encounter (Signed)
Pt aware forms were faxed over to Dr. [name]

## 2011-07-02 NOTE — Telephone Encounter (Signed)
I spoke with pt and she states she needed her proair sent to [facility] for #90 day supply x 3 refills. I advise pt would send rx. Nothing further was needed

## 2011-07-03 ENCOUNTER — Telehealth: Payer: Self-pay | Admitting: *Deleted

## 2011-07-03 ENCOUNTER — Ambulatory Visit (INDEPENDENT_AMBULATORY_CARE_PROVIDER_SITE_OTHER): Payer: Medicare Other | Admitting: Surgery

## 2011-07-03 ENCOUNTER — Encounter (INDEPENDENT_AMBULATORY_CARE_PROVIDER_SITE_OTHER): Payer: Self-pay | Admitting: Surgery

## 2011-07-03 VITALS — BP 128/72 | Ht 64.5 in | Wt 157.4 lb

## 2011-07-03 DIAGNOSIS — K5732 Diverticulitis of large intestine without perforation or abscess without bleeding: Secondary | ICD-10-CM

## 2011-07-03 NOTE — Progress Notes (Signed)
Subjective:     Patient ID: [name], female   DOB: 05/02/1941, 70 y.o.   MRN: [id number]  HPI Patient Active Problem List  Diagnoses  . INFECTIOUS DIARRHEA  . DIARRHEA-PRESUMED INFECTIOUS  . INFECTION, STAPHYLOCOCCUS NEC  . CANDIDIASIS  . HYPERLIPIDEMIA-MIXED  . EXTERNAL OTITIS  . BENIGN POSITIONAL VERTIGO  . MYOCARDIAL INFARCTION, HX OF  . CORONARY ARTERY DISEASE  . CAD, NATIVE VESSEL  . CARDIOMYOPATHY, ISCHEMIC  . CVA  . ACUTE BRONCHITIS  . COPD  . ACUT GASTR ULCER W/PERF W/O MENTION OBSTRUCTION  . GASTRIC ULCER  . Diverticulosis of Colon (without Mention of Hemorrhage)  . DIVERTICULITIS-COLON  . CELLULITIS/ABSCESS NOS  . WOUND INFECTION  . ARTHRITIS, LEFT SHOULDER  . OSTEOPENIA  . ABDOMINAL PAIN, LEFT LOWER QUADRANT  . Abdominal pain, generalized  . HYPOXEMIA  . FRACTURE, ANKLE, RIGHT  . CEREBROVASCULAR ACCIDENT, HX OF  . Personal History of Colonic Polyps  . DIVERTICULITIS, HX OF  . HYPERTENSION, BENIGN  . Hypertension  . Hyperlipemia  . Other general symptoms   . Iron deficiency anemia, unspecified  . Constipation, acute  . History of peptic ulcer disease  . Colitis  . Atypical back pain  . Colon cancer   Past Medical History  Diagnosis Date  . CAD (coronary artery disease)   . CVA (cerebral vascular accident)   . Myocardial infarct   . Other emphysema   . Unspecified arthropathy, shoulder region   . Gastric ulcer, unspecified as acute or chronic, without mention of hemorrhage, perforation, or obstruction   . Benign paroxysmal positional vertigo   . COPD (chronic obstructive pulmonary disease)   . Hypertension   . Diverticula of colon   . Osteopenia   . Personal history of colonic polyps 2007    tubular adenoma high grade dysplasia  . Diverticulitis   . Hyperlipemia   . Fracture of ankle, bimalleolar, right, closed   . Hypoxemia   . Tubular adenoma   . Benign positional vertigo   . Arthritis     left shoulder  . Hx of colonoscopy     Past Surgical History  Procedure Date  . Total abdominal hysterectomy w/ bilateral salpingoophorectomy   . Angioplasty   . Total shoulder arthroplasty 2012    left  . Orif ankle fracture 2007    right  . Breast enhancement surgery     bilateral  . Martial megeti     bladder  . Varicose vein surgery   . Ptca    Current Outpatient Prescriptions  Medication Sig Dispense Refill  . albuterol (PROAIR HFA) 108 (90 BASE) MCG/ACT inhaler Inhale 2 puffs into the lungs every 6 (six) hours as needed for wheezing.  3 Inhaler  3  . albuterol (VENTOLIN HFA) 108 (90 BASE) MCG/ACT inhaler Inhale 2 puffs into the lungs every 6 (six) hours as needed.        Marland Kitchen alendronate (FOSAMAX) 70 [name] tablet Take 70 [name] by mouth every 7 (seven) days. Take with a full glass of water on an empty stomach.       Marland Kitchen aspirin 81 [name] tablet Take 81 [name] by mouth daily.        . calcium-vitamin D (OSCAL WITH D) 500-200 [name]-UNIT per tablet Take 1 tablet by mouth daily.        [name] CAPS Take 1 tablet by mouth daily.       . Cholecalciferol (VITAMIN D3) 2000 UNITS TABS Take 1 tablet by  mouth 2 (two) times daily.        Jamie Martes Leaf (URINARY TRACT HEALTH) 365-50 [name] CAPS Take 1 capsule by mouth daily.        Marland Kitchen dexlansoprazole (DEXILANT) 60 [name] capsule Take 60 [name] by mouth daily.        Marland Kitchen docusate sodium (COLACE) 100 [name] capsule Take 100 [name] by mouth as needed.        . Fluticasone-Salmeterol (ADVAIR DISKUS) 250-50 MCG/DOSE AEPB Inhale 1 puff into the lungs every 12 (twelve) hours.  3 each  1  . Garlic (ODORLESS GARLIC) 1250 [name] TABS Take 1 tablet by mouth every other day.       Marland Kitchen L-Lysine 500 [name] TABS Take 1 tablet by mouth every other day.       . meloxicam (MOBIC) 15 [name] tablet Take 15 [name] by mouth daily.        . methocarbamol (ROBAXIN) 500 [name] tablet Take 500 [name] by mouth 2 (two) times daily as needed.        . metoprolol succinate (TOPROL-XL) 25 [name] 24 hr tablet Take 25 [name] by mouth at bedtime.        . Multiple Vitamin (MULTIVITAMIN) capsule Take 0.5 capsules by mouth 2 (two) times daily.       . nitroGLYCERIN (NITROSTAT) 0.4 [name] SL tablet Place 0.4 [name] under the tongue every 5 (five) minutes as needed.        . Omega-3 Fatty Acids (FISH OIL) 1200 [name] CAPS Take 1 capsule by mouth daily.       Marland Kitchen oxyCODONE (OXYCONTIN) 10 [name] 12 hr tablet Take 0.5 [name] by mouth every 12 (twelve) hours. As needed      . predniSONE (DELTASONE) 5 [name] tablet Take 5 [name] by mouth daily. As directed       . Probiotic Product (PROBIOTIC PO) Take 1 tablet by mouth daily.        . simvastatin (ZOCOR) 40 [name] tablet Take 40 [name] by mouth at bedtime.        Marland Kitchen tiotropium (SPIRIVA) 18 MCG inhalation capsule Place 18 mcg into inhaler and inhale daily.        . traMADol (ULTRAM) 50 [name] tablet Take 50 [name] by mouth 2 (two) times daily as needed.        . vitamin B-12 (CYANOCOBALAMIN) 1000 MCG tablet Take 1,000 mcg by mouth daily.        Marland Kitchen zolpidem (AMBIEN) 5 [name] tablet Take 5 [name] by mouth at bedtime as needed.         Current Facility-Administered Medications  Medication Dose Route Frequency Provider Last Rate Last Dose  . 0.9 %  sodium chloride infusion  500 mL Intravenous Continuous Sheryn Bison, MD       Current Outpatient Prescriptions on File Prior to Visit  Medication Sig Dispense Refill  . albuterol (PROAIR HFA) 108 (90 BASE) MCG/ACT inhaler Inhale 2 puffs into the lungs every 6 (six) hours as needed for wheezing.  3 Inhaler  3  . albuterol (VENTOLIN HFA) 108 (90 BASE) MCG/ACT inhaler Inhale 2 puffs into the lungs every 6 (six) hours as needed.        Marland Kitchen alendronate (FOSAMAX) 70 [name] tablet Take 70 [name] by mouth every 7 (seven) days. Take with a full glass of water on an empty stomach.       Marland Kitchen aspirin 81 [name] tablet Take 81 [name] by mouth daily.        . calcium-vitamin D (  OSCAL WITH D) 500-200 [name]-UNIT per tablet Take 1 tablet by mouth daily.        [name] CAPS Take 1 tablet by mouth daily.       .  Cholecalciferol (VITAMIN D3) 2000 UNITS TABS Take 1 tablet by mouth 2 (two) times daily.        Jamie Martes Leaf (URINARY TRACT HEALTH) 365-50 [name] CAPS Take 1 capsule by mouth daily.        Marland Kitchen dexlansoprazole (DEXILANT) 60 [name] capsule Take 60 [name] by mouth daily.        Marland Kitchen docusate sodium (COLACE) 100 [name] capsule Take 100 [name] by mouth as needed.        . Fluticasone-Salmeterol (ADVAIR DISKUS) 250-50 MCG/DOSE AEPB Inhale 1 puff into the lungs every 12 (twelve) hours.  3 each  1  . Garlic (ODORLESS GARLIC) 1250 [name] TABS Take 1 tablet by mouth every other day.       Marland Kitchen L-Lysine 500 [name] TABS Take 1 tablet by mouth every other day.       . meloxicam (MOBIC) 15 [name] tablet Take 15 [name] by mouth daily.        . methocarbamol (ROBAXIN) 500 [name] tablet Take 500 [name] by mouth 2 (two) times daily as needed.        . metoprolol succinate (TOPROL-XL) 25 [name] 24 hr tablet Take 25 [name] by mouth at bedtime.       . Multiple Vitamin (MULTIVITAMIN) capsule Take 0.5 capsules by mouth 2 (two) times daily.       . nitroGLYCERIN (NITROSTAT) 0.4 [name] SL tablet Place 0.4 [name] under the tongue every 5 (five) minutes as needed.        . Omega-3 Fatty Acids (FISH OIL) 1200 [name] CAPS Take 1 capsule by mouth daily.       Marland Kitchen oxyCODONE (OXYCONTIN) 10 [name] 12 hr tablet Take 0.5 [name] by mouth every 12 (twelve) hours. As needed      . predniSONE (DELTASONE) 5 [name] tablet Take 5 [name] by mouth daily. As directed       . Probiotic Product (PROBIOTIC PO) Take 1 tablet by mouth daily.        . simvastatin (ZOCOR) 40 [name] tablet Take 40 [name] by mouth at bedtime.        Marland Kitchen tiotropium (SPIRIVA) 18 MCG inhalation capsule Place 18 mcg into inhaler and inhale daily.        . traMADol (ULTRAM) 50 [name] tablet Take 50 [name] by mouth 2 (two) times daily as needed.        . vitamin B-12 (CYANOCOBALAMIN) 1000 MCG tablet Take 1,000 mcg by mouth daily.        Marland Kitchen zolpidem (AMBIEN) 5 [name] tablet Take 5 [name] by mouth at bedtime as needed.         Current Facility-Administered Medications on File  Prior to Visit  Medication Dose Route Frequency Provider Last Rate Last Dose  . 0.9 %  sodium chloride infusion  500 mL Intravenous Continuous Sheryn Bison, MD       Allergies  Allergen Reactions  . Lexapro     N/v, hot flashes, SOB  . Codeine     REACTION: NAUSEA-HAPPENED IN 20'S-PATIENT NOT SURE REALLY ALLERGIC  . Keflex     cdiff  . Sulfonamide Derivatives       Review of Systems    Objective:   Physical Exam Filed Vitals:   07/03/11 1212  BP: 128/72  HEENT unremarkable Neck supple Chest breath sounds equal bilaterally Heart sinus rhythm no murmurs or gallops Abdomen no masses. Assessment:     History of chronic diverticulitis of the sigmoid colon. Now with near obstructing area worrisome for cancer but on biopsy not definitive.    Plan:     Laparoscopically assisted sigmoid colectomy.

## 2011-07-03 NOTE — Telephone Encounter (Signed)
Dr Luretha Murphy called to verify path report was correct- necrotic tissue only, not cancer. Harlow Mares, CMA spoke with Dr [name] and confirmed that Dr Jarold Motto felt she needed a colon resection. Dr [name] saw pt today and will do surgery tomorrow or Monday. FYI Dr Jarold Motto.

## 2011-07-04 NOTE — Telephone Encounter (Signed)
great

## 2011-07-07 ENCOUNTER — Other Ambulatory Visit (INDEPENDENT_AMBULATORY_CARE_PROVIDER_SITE_OTHER): Payer: Self-pay | Admitting: Surgery

## 2011-07-07 ENCOUNTER — Telehealth: Payer: Self-pay | Admitting: Cardiovascular Disease

## 2011-07-07 ENCOUNTER — Inpatient Hospital Stay (HOSPITAL_COMMUNITY)
Admission: RE | Admit: 2011-07-07 | Discharge: 2011-07-17 | DRG: 330 | Disposition: A | Discharge status: Home / Self Care | Payer: Medicare Other | Source: Ambulatory Visit | Attending: Surgery | Admitting: Surgery

## 2011-07-07 DIAGNOSIS — J449 Chronic obstructive pulmonary disease, unspecified: Secondary | ICD-10-CM | POA: Diagnosis present

## 2011-07-07 DIAGNOSIS — Z8673 Personal history of transient ischemic attack (TIA), and cerebral infarction without residual deficits: Secondary | ICD-10-CM

## 2011-07-07 DIAGNOSIS — I472 Ventricular tachycardia, unspecified: Secondary | ICD-10-CM | POA: Diagnosis not present

## 2011-07-07 DIAGNOSIS — J4489 Other specified chronic obstructive pulmonary disease: Secondary | ICD-10-CM | POA: Diagnosis present

## 2011-07-07 DIAGNOSIS — I252 Old myocardial infarction: Secondary | ICD-10-CM

## 2011-07-07 DIAGNOSIS — I10 Essential (primary) hypertension: Secondary | ICD-10-CM | POA: Diagnosis present

## 2011-07-07 DIAGNOSIS — K5732 Diverticulitis of large intestine without perforation or abscess without bleeding: Secondary | ICD-10-CM

## 2011-07-07 DIAGNOSIS — I2510 Atherosclerotic heart disease of native coronary artery without angina pectoris: Secondary | ICD-10-CM | POA: Diagnosis present

## 2011-07-07 DIAGNOSIS — Z9861 Coronary angioplasty status: Secondary | ICD-10-CM

## 2011-07-07 DIAGNOSIS — Z79899 Other long term (current) drug therapy: Secondary | ICD-10-CM

## 2011-07-07 DIAGNOSIS — I4729 Other ventricular tachycardia: Secondary | ICD-10-CM | POA: Diagnosis not present

## 2011-07-07 HISTORY — PX: COLON SURGERY: SHX602

## 2011-07-07 LAB — COMPREHENSIVE METABOLIC PANEL
AST: 23 U/L (ref 0–37)
Albumin: 3.8 g/dL (ref 3.5–5.2)
Chloride: 106 mEq/L (ref 96–112)
Creatinine, Ser: 0.68 mg/dL (ref 0.50–1.10)
Total Bilirubin: 0.5 mg/dL (ref 0.3–1.2)
Total Protein: 6.5 g/dL (ref 6.0–8.3)

## 2011-07-07 LAB — CBC
MCHC: 33.5 g/dL (ref 30.0–36.0)
MCV: 88.6 fL (ref 78.0–100.0)
Platelets: 214 10*3/uL (ref 150–400)
RDW: 13.1 % (ref 11.5–15.5)
WBC: 7.8 10*3/uL (ref 4.0–10.5)

## 2011-07-07 LAB — ABO/RH: ABO/RH(D): O POS

## 2011-07-07 LAB — TYPE AND SCREEN: Antibody Screen: NEGATIVE

## 2011-07-07 NOTE — Telephone Encounter (Signed)
Pt surgery at [facility]  today 415p , will be there 5-7 days

## 2011-07-07 NOTE — Telephone Encounter (Signed)
See note

## 2011-07-08 LAB — BASIC METABOLIC PANEL
BUN: 15 mg/dL (ref 6–23)
CO2: 28 mEq/L (ref 19–32)
Chloride: 104 mEq/L (ref 96–112)
GFR calc Af Amer: >60 mL/min (ref >60)
Glucose, Bld: 106 mg/dL — ABNORMAL HIGH (ref 70–99)
Potassium: 3.8 mEq/L (ref 3.5–5.1)

## 2011-07-08 LAB — CBC
HCT: 34.1 % — ABNORMAL LOW (ref 36.0–46.0)
Hemoglobin: 11.2 g/dL — ABNORMAL LOW (ref 12.0–15.0)
RBC: 3.8 MIL/uL — ABNORMAL LOW (ref 3.87–5.11)
RDW: 13.2 % (ref 11.5–15.5)
WBC: 10.7 10*3/uL — ABNORMAL HIGH (ref 4.0–10.5)

## 2011-07-08 LAB — DIFFERENTIAL
Basophils Absolute: 0 10*3/uL (ref 0.0–0.1)
Lymphocytes Relative: 15 % (ref 12–46)
Neutro Abs: 8.4 10*3/uL — ABNORMAL HIGH (ref 1.7–7.7)
Neutrophils Relative %: 78 % — ABNORMAL HIGH (ref 43–77)

## 2011-07-10 LAB — BASIC METABOLIC PANEL
BUN: 4 mg/dL — ABNORMAL LOW (ref 6–23)
Chloride: 108 mEq/L (ref 96–112)
GFR calc Af Amer: >60 mL/min (ref >60)
Potassium: 3.6 mEq/L (ref 3.5–5.1)

## 2011-07-11 LAB — DIFFERENTIAL
Eosinophils Relative: 6 % — ABNORMAL HIGH (ref 0–5)
Lymphocytes Relative: 28 % (ref 12–46)
Lymphs Abs: 1.8 10*3/uL (ref 0.7–4.0)

## 2011-07-11 LAB — CBC
HCT: 31.6 % — ABNORMAL LOW (ref 36.0–46.0)
MCV: 88.5 fL (ref 78.0–100.0)
RBC: 3.57 MIL/uL — ABNORMAL LOW (ref 3.87–5.11)
RDW: 13.3 % (ref 11.5–15.5)
WBC: 6.3 10*3/uL (ref 4.0–10.5)

## 2011-07-11 LAB — BASIC METABOLIC PANEL
BUN: 4 mg/dL — ABNORMAL LOW (ref 6–23)
CO2: 26 mEq/L (ref 19–32)
Chloride: 108 mEq/L (ref 96–112)
Creatinine, Ser: 0.65 mg/dL (ref 0.50–1.10)
Glucose, Bld: 111 mg/dL — ABNORMAL HIGH (ref 70–99)

## 2011-07-15 DIAGNOSIS — I472 Ventricular tachycardia: Secondary | ICD-10-CM

## 2011-07-15 LAB — CARDIAC PANEL(CRET KIN+CKTOT+MB+TROPI)
CK, MB: 1.4 ng/mL (ref 0.3–4.0)
CK, MB: 1.6 ng/mL (ref 0.3–4.0)
CK, MB: 1.6 ng/mL (ref 0.3–4.0)
Relative Index: INVALID (ref 0.0–2.5)
Total CK: 40 U/L (ref 7–177)
Troponin I: <0.3 ng/mL (ref <0.30)

## 2011-07-15 LAB — BASIC METABOLIC PANEL
BUN: 15 mg/dL (ref 6–23)
CO2: 30 mEq/L (ref 19–32)
Chloride: 100 mEq/L (ref 96–112)
Glucose, Bld: 115 mg/dL — ABNORMAL HIGH (ref 70–99)
Potassium: 4.3 mEq/L (ref 3.5–5.1)

## 2011-07-16 ENCOUNTER — Telehealth: Payer: Self-pay | Admitting: Cardiovascular Disease

## 2011-07-16 DIAGNOSIS — I059 Rheumatic mitral valve disease, unspecified: Secondary | ICD-10-CM

## 2011-07-16 NOTE — Telephone Encounter (Signed)
Spoke with pt and she is anxious to be discharged from hospital and states has been told she can not be released until test read. I asked pt to discuss this with her nurse in the hospital and if there are additional questions she should have nurse contact PA covering hospital.

## 2011-07-16 NOTE — Telephone Encounter (Signed)
Pt called she is in hospital and she says she is ready to leave and wants to talk to a nurse about sonogram so she can leave

## 2011-07-16 NOTE — Op Note (Signed)
NAMEADAIRA, CENTOLA NO.:  [id number]  MEDICAL RECORD NO.:  [id number]  LOCATION:  1227                         FACILITY:  [facility]  PHYSICIAN:  [name]. [name], MD  DATE OF BIRTH:  [birth year]  DATE OF PROCEDURE:  07/07/2011 DATE OF DISCHARGE:                              OPERATIVE REPORT   PREOPERATIVE DIAGNOSIS:  Sigmoid diverticulitis.  POSTOPERATIVE DIAGNOSIS:  Sigmoid diverticulitis.  PROCEDURE:  Laparoscopic-assisted sigmoid colectomy, sent for frozen section.  SURGEON:  [name]. [name], MD  ASSISTANT:  Sandria Bales. Ezzard Standing, M.D.  DESCRIPTION OF PROCEDURE:  Illa Enlow was taken to OR #1 on Monday evening, July 07, 2011, at a little after half past six.  The abdomen was prepped with PCMX and she was placed up in dorsolithotomy and under general endotracheal anesthesia.  Sterile drapes were applied and a time-out was performed.  Access to the abdomen was initially achieved through the right upper quadrant in a 5 mm 0-degree OptiView technique, insufflating the abdomen, and then placing another 5 lateral to the umbilicus and another 5 in the midline.  With these 2 operative ports, I introduced a harmonic scalpel and mobilized the left colon along the sidewall.  The mid sigmoid was found to be densely adherent to the lateral sidewall.  I used blunt dissection with a dissector and irrigator to initially free it.  Then, we inserted a hand port through a 9 cm incision in the lower abdomen.  With the hand port, I was able to feel and got this mobilized in the midline.  Once I mobilized, the colon itself was not as hard as initially felt when I took it off the sidewall, it was very stuck.  This area was brought up into the midline fairly easily and I got good looking tissue above and below this area. I went ahead and removed the GelPort and resected proximally and distally with the stapling device sealing off both ends.  I put a marking suture on the  distal portion of the resected segment of sigmoid and I sent it over for frozen section.  We called in pathology.  They replied responding this was a benign diverticulitis looking, no evidence of tumor.  I went ahead and then opened the ends of the bowel and did a two-layer hand-sewn anastomosis suturing 3-0 silks along the back wall and inner layer of 4-0 PDS running locking on the back wall carried anteriorly in a Connell-Mayo fashion.  An anterior layer of 3-0 silks was applied.  We then clamped off the bowel, inserted the scope from below, and Dr. Ezzard Standing inflated it with a bulb and I got good distention. Under water, there was no evidence of any bubbles and it certainly went easily above this new anastomosis.  Everything else felt good.  Sponge and needle counts were correct.  The midline incision was then closed with interrupted #1 Novafil sutures.  The wound was irrigated and closed with 4-0 Vicryl and staples.  The 2 lateral ports were closed with 4-0 Vicryl and staples. The patient was taken to the recovery room prior to the transfer to step- down for monitoring.  Prior to completion of the case, I did  inject the wounds with Exparel, approximately 10 cc.     [name] [name], MD     MBM/MEDQ  D:  07/07/2011  T:  07/08/2011  Job:  [id number]  cc:   [name]. [name], MD,FACP,FCCP [phone] [place] [address]  Vania Rea. Jarold Motto, MD, FACG, FACP, FAGA [address] [place] [address]  Electronically Signed by Luretha Murphy MD on 07/16/2011 07:34:45 AM

## 2011-07-17 LAB — COMPREHENSIVE METABOLIC PANEL
AST: 32
Albumin: 3.6
Chloride: 104
Creatinine, Ser: 1.05
GFR calc Af Amer: >60
Potassium: 4.4
Total Bilirubin: 0.8

## 2011-07-17 LAB — URINALYSIS, ROUTINE W REFLEX MICROSCOPIC
Glucose, UA: NEGATIVE
Nitrite: NEGATIVE
Protein, ur: NEGATIVE
pH: 5.5

## 2011-07-17 LAB — CBC
HCT: 40.3
Hemoglobin: 13.8
Platelets: 168
WBC: 8.6

## 2011-07-17 LAB — POCT CARDIAC MARKERS
CKMB, poc: <1 — ABNORMAL LOW
Myoglobin, poc: 61.9
Operator id: 294591
Troponin i, poc: <0.05

## 2011-07-17 LAB — C-REACTIVE PROTEIN: CRP: 0.4 — ABNORMAL LOW (ref <0.6)

## 2011-07-17 LAB — HEMOGLOBIN A1C: Hgb A1c MFr Bld: 5.5

## 2011-07-17 LAB — DIFFERENTIAL
Basophils Absolute: 0
Eosinophils Relative: 5
Lymphocytes Relative: 31
Monocytes Absolute: 0.7
Monocytes Relative: 8

## 2011-07-17 LAB — VITAMIN B12: Vitamin B-12: 1011 — ABNORMAL HIGH (ref 211–911)

## 2011-07-17 LAB — LIPID PANEL
Cholesterol: 142
Total CHOL/HDL Ratio: 3.2

## 2011-07-17 LAB — TROPONIN I: Troponin I: 0.01

## 2011-07-17 LAB — CK TOTAL AND CKMB (NOT AT ARMC): CK, MB: 2.2

## 2011-07-17 LAB — ANA: Anti Nuclear Antibody(ANA): NEGATIVE

## 2011-07-21 NOTE — Consult Note (Signed)
[name], RIEDLINGER NO.:  [id number]  MEDICAL RECORD NO.:  [id number]  LOCATION:  1510                         FACILITY:  [facility]  PHYSICIAN:  [name]. Jamie Emms, MD, FACCDATE OF BIRTH:  January 14, 1941  DATE OF CONSULTATION:  07/15/2011 DATE OF DISCHARGE:                                CONSULTATION   PRIMARY CARDIOLOGIST:  Jamie Carrow, MD  PRIMARY CARE PROVIDER:  Titus [name]. Jamie Ren, MD, FACP, [facility]  PULMONOLOGIST:  Jamie Locket Vassie Loll, MD  REQUESTING PHYSICIAN:  [name]. [name], MD.  PATIENT PROFILE:  A 70 year old female with prior history of CAD, ischemic cardiomyopathy who is status post sigmoid colectomy on September 17th whom we had been asked to evaluate secondary to asymptomatic nonsustained ventricular tachycardia.  PROBLEMS: 1. Asymptomatic nonsustained ventricular tachycardia.     a.     Also noted in August 2011. 2. Coronary artery disease.     a.     Status post anterior MI in 1990, treated with t-PA.     b.     The patient later had percutaneous transluminal coronary      angioplasty of left anterior descending in February 1990, June      1990, in December 1990.     c.     April 26, 2009, Percutaneous Coronary Intervention of the left      circumflex with placement of 2.5 x 15 mm Xience drug-eluting      stent.     d.     December 04, 2009, cardiac catheterization, left main      normal.  LAD small with minor irregularities.  Left circumflex      with patent stent.  Right carotid artery 40% proximal and 57% mid      stenosis.  Ejection fraction was 40% to 45%. 3. Ischemic cardiomyopathy.     a.     May 22, 2010, a 2-D echocardiogram, ejection fraction 45%      to 50% with distal septal apical akinesis.  Trivial mitral      regurgitation and mild tricuspid regurgitation. 4. Question of left ventricular thrombus in August 2009.     a.     Suggestion of atraumatic trauma by chest wall      echocardiogram, however, subsequent cardiac MRI in  late August      2009 showed no evidence of thrombus. 5. History of cerebrovascular accident in 2009, treated with t-PA, and     no residual deficits. 6. Asthmatic bronchitis/chronic obstructive pulmonary disease. 7. Remote tobacco abuse with a 40 plus pack-year history, quitting in     1999. 8. Peptic ulcer disease - gastric. 9. Gastroesophageal reflux disease. 10.Benign positional vertigo in August 2011. 11.Osteoarthritis, status post left shoulder arthroplasty in February     2012. 12.Osteopenia. 13.Colon polyps. 14.History of candidal esophagitis. 15.Status post total abdominal hysterectomy-bilateral salpingo-     oophorectomy. 16.History of recurrent diverticulitis and diarrhea, status post     sigmoid colectomy on July 07, 2011. 17.Status post appendectomy. 18.History of left ankle fracture, status post repair. 19.Status post bilateral breast augmentation. 20.Status post bladder surgery. 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  ALLERGIES: 1. SULFA. 2. CODEINE. 3.  LEXAPRO. 4. KEFLEX.HISTORY OF PRESENT ILLNESS:  A 70 year old female with the above complex problems.  The patient has had recurrent diverticulitis and diarrhea and on September 17th underwent elective sigmoid colectomy.  Postop course has been uneventful, but she has been noted on telemetry to have occasional 5-9 beat runs of non-sustained ventricular tachycardia which had been asymptomatic.  Last episode occurred last evening at 9:51 p.m. The patient has had no chest pain, dyspnea, palpitations, presyncope, or syncope and we have been asked to evaluate.  She is awaiting discharge pending our review and a bowel movement.  CURRENT MEDICATIONS: 1. Albuterol 2.5 mg inhaled q.6 h. 2. Artificial tears b.i.d. 3. Advair 250/50 one puff b.i.d. 4. Heparin 5000 units q.8 h. 5. Claritin 10 mg daily. 6. Toprol-XL 25 mg daily. 7. Spiriva 18 mcg daily.  FAMILY HISTORY:  Mother died at 44 with a  history of COPD, coronary disease, and CABG.  Father died at 47 of MI.  She has 3 sisters, 2 of which have breast cancer.  SOCIAL HISTORY:  The patient lives in [place].  She is divorced.  She has a 40 plus pack-year history of tobacco abuse, quitting in January 1999. She drinks a few glasses of wine per week.  Denies drug use.  She stopped routine exercising exercise.  REVIEW OF SYSTEMS:  Positive for lower abdominal discomfort and tenderness with flatus and gas pain since surgery.  She is yet to have a bowel movement.  Otherwise, all systems reviewed are negative.  She is a full code.  PHYSICAL EXAMINATION:  VITAL SIGNS:  Temperature 98.1, heart rate 78, respirations 18, blood pressure 112/64, pulse oximetry 92% on room air. GENERAL:  Pleasant, white female, in no acute distress, awake, oriented x3.  She has normal affect. HEENT:  Normal.  Nares grossly intact, nonfocal. SKIN:  Warm and dry.  No lesions or masses. NECK:  Supple without bruits, JVD. LUNGS:  Respirations are unlabored.  Clear to auscultation. CARDIAC:  Regular S1 and S1.  No S3, S4, murmurs. ABDOMEN:  Round, soft, and diffusely tender with bowel sounds present x4. EXTREMITIES:  Warm, dry.  No clubbing or cyanosis.  Trace bilateral lower extremity edema.  Distal pulses are 1+ bilaterally.  Chest x-ray is pending.  EKG shows sinus rhythm, rate 85, normal axis, no acute ST-T changes.  Hemoglobin 10.7, hematocrit 31.6, WBC 6.3, platelets 184.  Sodium 137, potassium 4.3, chloride 100, CO2 of 30, BUN 15, creatinine 0.90, glucose 115.  Total bilirubin 0.5, alkaline phosphatase 141, AST 23, ALT 15, total protein 6.5, albumin 3.8.  CK 40, MB 1.4, troponin-I less than 0.30.  Calcium 9.3, magnesium 2.2.  MRSA screen was negative.  ASSESSMENT: 1. Non-sustained ventricular tachycardia, asymptomatic.  The patient's     prior history of nonsustained VT in August 2011 and remains on beta-     blocker therapy.  EF is known  to be 45% to 50% by echo in August     2011.  We will repeat now.  Electrolytes and magnesium are okay.     Continue beta-blocker at current dose.  Unfortunately, her soft     blood pressures will limit further titration. 2. Coronary artery disease.  She denies chest pain.  Continue beta-     blocker.  Resume statin and aspirin when okay with Surgery. 3. Ischemic cardiomyopathy.  Euvolemic on exam.  Continue beta-     blocker.  Now on ACE inhibitor and ARB secondary to soft blood  pressures. 4. Chronic obstructive pulmonary disease.  No wheezing.  Continue     inhalers. 5. History of diverticulitis, status post sigmoid colectomy per     Surgery.     Nicolasa Ducking, ANP   ______________________________ [name]. Jamie Emms, MD, [facility]    CB/MEDQ  D:  07/15/2011  T:  07/15/2011  Job:  [id number]  Electronically Signed by Charlton Haws MD [facility] on 07/21/2011 06:23:50 PM

## 2011-07-22 ENCOUNTER — Telehealth (INDEPENDENT_AMBULATORY_CARE_PROVIDER_SITE_OTHER): Payer: Self-pay | Admitting: General Surgery

## 2011-07-22 LAB — IRON AND TIBC
Saturation Ratios: 40
TIBC: 202 — ABNORMAL LOW

## 2011-07-22 LAB — URINALYSIS, ROUTINE W REFLEX MICROSCOPIC
Hgb urine dipstick: NEGATIVE
Nitrite: NEGATIVE
Specific Gravity, Urine: 1.035 — ABNORMAL HIGH
Urobilinogen, UA: 1
pH: 5

## 2011-07-22 LAB — DIFFERENTIAL
Basophils Absolute: 0
Eosinophils Absolute: 0.1
Eosinophils Relative: 1
Eosinophils Relative: 1
Lymphocytes Relative: 8 — ABNORMAL LOW
Lymphs Abs: 1
Lymphs Abs: 1
Monocytes Absolute: 0.7
Monocytes Absolute: 1
Monocytes Relative: 9
Neutro Abs: 11.1 — ABNORMAL HIGH
Neutrophils Relative %: 80 — ABNORMAL HIGH

## 2011-07-22 LAB — CARDIAC PANEL(CRET KIN+CKTOT+MB+TROPI)
Relative Index: INVALID
Total CK: 100
Total CK: 95
Troponin I: <0.01

## 2011-07-22 LAB — COMPREHENSIVE METABOLIC PANEL
AST: 23
Albumin: 3.8
BUN: 24 — ABNORMAL HIGH
Calcium: 9.5
Chloride: 106
Creatinine, Ser: 0.78
GFR calc Af Amer: >60
Total Bilirubin: 0.9

## 2011-07-22 LAB — CBC
HCT: 36.3
HCT: 43.8
Hemoglobin: 12.4
MCHC: 33.1
MCHC: 33.8
MCV: 93.9
MCV: 93.9
Platelets: 183
Platelets: 204
RBC: 3.36 — ABNORMAL LOW
RBC: 3.87
RDW: 12.3
RDW: 12.5
RDW: 12.5
WBC: 10.7 — ABNORMAL HIGH
WBC: 12.9 — ABNORMAL HIGH

## 2011-07-22 LAB — TROPONIN I: Troponin I: <0.01

## 2011-07-22 LAB — AMYLASE: Amylase: 154 — ABNORMAL HIGH

## 2011-07-22 LAB — BASIC METABOLIC PANEL
CO2: 26
Calcium: 7.6 — ABNORMAL LOW
Chloride: 107
Creatinine, Ser: 0.7
GFR calc Af Amer: >60
GFR calc Af Amer: >60
Glucose, Bld: 89
Potassium: 4.3
Sodium: 136

## 2011-07-22 LAB — CK TOTAL AND CKMB (NOT AT ARMC)
Relative Index: INVALID
Total CK: 87

## 2011-07-22 LAB — CLOTEST (H. PYLORI), BIOPSY: Helicobacter screen: NEGATIVE

## 2011-07-22 LAB — CULTURE, BLOOD (ROUTINE X 2): Culture: NO GROWTH

## 2011-07-22 LAB — MAGNESIUM: Magnesium: 2.2

## 2011-07-22 LAB — LIPASE, BLOOD: Lipase: 21

## 2011-07-22 LAB — URINE MICROSCOPIC-ADD ON

## 2011-07-22 LAB — VITAMIN B12: Vitamin B-12: 909 (ref 211–911)

## 2011-07-22 NOTE — Telephone Encounter (Signed)
Pt called in stating she was having pain that she had before sx on 07/07/11. Pt stated she has been taking pain medication since sx and today was the first time she experienced any constipation. Stated the pain was strong enough that it was very painful to push/bear down, unable to pass stool due to pain. Advised pt Dr. [name] on vacation, set her up for urge office tomorrow. Advised pt to keep drinking water and try walking to help with bowel movement.

## 2011-07-23 ENCOUNTER — Ambulatory Visit (INDEPENDENT_AMBULATORY_CARE_PROVIDER_SITE_OTHER): Payer: Medicare Other | Admitting: General Surgery

## 2011-07-23 ENCOUNTER — Encounter (INDEPENDENT_AMBULATORY_CARE_PROVIDER_SITE_OTHER): Payer: Self-pay | Admitting: General Surgery

## 2011-07-23 DIAGNOSIS — R109 Unspecified abdominal pain: Secondary | ICD-10-CM

## 2011-07-23 LAB — CBC WITH DIFFERENTIAL/PLATELET
Basophils Absolute: 0 10*3/uL (ref 0.0–0.1)
HCT: 40.3 % (ref 36.0–46.0)
Hemoglobin: 13.3 g/dL (ref 12.0–15.0)
Lymphocytes Relative: 21 % (ref 12–46)
Monocytes Absolute: 0.7 10*3/uL (ref 0.1–1.0)
Monocytes Relative: 7 % (ref 3–12)
Neutro Abs: 6.5 10*3/uL (ref 1.7–7.7)
WBC: 9.4 10*3/uL (ref 4.0–10.5)

## 2011-07-23 LAB — BASIC METABOLIC PANEL
BUN: 24 mg/dL — ABNORMAL HIGH (ref 6–23)
Chloride: 109 mEq/L (ref 96–112)
Glucose, Bld: 105 mg/dL — ABNORMAL HIGH (ref 70–99)
Potassium: 4.3 mEq/L (ref 3.5–5.3)

## 2011-07-23 NOTE — Progress Notes (Signed)
Subjective:     Patient ID: [name], female   DOB: 07/26/1941, 70 y.o.   MRN: [id number]  HPI This is a 70 year old female patient of Dr. Duane Lope who underwent a laparoscopic sigmoid colectomy 2 weeks ago. She was discharged home alone ago. She had right lower quadrant pain in the hospital does been persisting and increasing over the last week or so. She has been having bowel movements but she's had some constipation last 24 hours but she has had a bowel movement today. She is passing flatus. She denies any fevers. She denies any nausea or vomiting. She comes in today due to worsening of what mostly his right lower quadrant and suprapubic pain. She reported no difficulty with urination or any burning or symptoms associated with infection.  Review of Systems     Objective:   Physical Exam  Constitutional: She appears well-developed and well-nourished.  Abdominal: There is tenderness in the right lower quadrant.         Assessment:     Abdominal pain s/p lap sigmoid colectomy    Plan:        She is more than expected abdominal pain after the surgery. She doesn't have a fevers or other evidence of infection right now. She does come in today with worsening of some lower abdominal pain after a colon operation. I recommended a CT scan to her today but she does not want to pursue that right now. She has agreed to obtain some labs I will follow up with her after these labs were drawn.

## 2011-07-24 ENCOUNTER — Telehealth (INDEPENDENT_AMBULATORY_CARE_PROVIDER_SITE_OTHER): Payer: Self-pay | Admitting: General Surgery

## 2011-07-24 NOTE — Telephone Encounter (Signed)
Patient called and gave her lab results, she stated she has stopped taking her pain medication hoping that it would stop her constipation. Explained to her that she should try to increase her fluid intake to 8-8oz glasses of water a day, and Dr Dwain Sarna suggested she also try to use some MOM. Patient understood and will call back if any further problems.

## 2011-07-24 NOTE — Telephone Encounter (Signed)
Called patient and left message for patient to call back and ask for me. Need to make her aware labs are ok. She needs to increase fluid intake, use MOM for help with BM's and call back if she develops fever or more pain.

## 2011-07-24 NOTE — Telephone Encounter (Signed)
Message copied by Liliana Cline on Thu Jul 24, 2011 10:25 AM ------      Message from: Dwain Sarna, MATTHEW      Created: Thu Jul 24, 2011  8:28 AM       Elease Hashimoto,      These labs are normal.  She can keep taking pain meds and I would also recommend increasing water intake.  She can take some MOM for her bowel movements.  If she has a fever, any other changes or this pain is still getting worse then she needs to call back for consideration of a ct scan.      Can you tell me what number Charm Barges wanted to be called at?      Carolynne Edouard knows about Su Hilt and should be taking care of that this am.  Would you please touch base with him about it so the daughter can get called this am?      Will you call [facility] and ask if she needs to come in prior to her surgery next week to talk?            Thanks,      MW      ----- Message -----         From: [facility] Interface         Sent: 07/23/2011  10:59 PM           To: Emelia Loron, MD

## 2011-07-25 ENCOUNTER — Telehealth: Payer: Self-pay | Admitting: Pulmonary Disease

## 2011-07-25 NOTE — Telephone Encounter (Signed)
Spoke with pt and advised her to call back on 10/22 requesting for her spiriva to be sent since she did not need it now. Pt states she did not want it sent until then. Pt states she would call back then. Nothing further was needed

## 2011-07-25 NOTE — Telephone Encounter (Signed)
1 sample of each has been left up front for p/u. We did not have any samples of proair. Pt aware  Carver Fila, CMA

## 2011-07-29 NOTE — Discharge Summary (Signed)
  [name], [name] NO.:  [id number]  MEDICAL RECORD NO.:  [id number]  LOCATION:  1510                         FACILITY:  [facility]  PHYSICIAN:  [name]. [name], MD  DATE OF BIRTH:  Mar 17, 1941  DATE OF ADMISSION:  07/07/2011 DATE OF DISCHARGE:  07/16/2011                              DISCHARGE SUMMARY   ADMITTING DIAGNOSIS:  Obstructing mass, sigmoid colon.  DISCHARGE DIAGNOSIS:  Diverticulitis/diverticulosis.  PROCEDURE:  Laparoscopically-assisted sigmoid colectomy.  PATHOLOGY:  __________.  COURSE IN HOSPITAL:  [name] underwent the above-mentioned operation.  She was kept in step-down postop because she came out of surgery late in the evening and because of her underlying cardiac history and pulmonary history.  We started on clear liquids on postop day #2 when she was still in the step-down awaiting a bed.  Pathology revealed the diverticulitis and not cancer.  She began having some liquidy bowel movements by postop day #4 and was begun on some liquids and did have some kind of asymptomatic V-tach noted on telemetry.  For that reason, we kept her around on telemetry until she was having good bowel movements, but did have another bout of V-tach and we had Cardiology see her.  They evaluated her and she does have an echo pending this morning prior to her discharge.  She has had no further V- tach noted.  She has continued to have bowel movements.  Incision is healing nicely.  At home, she takes tramadol and oxycodone, so no further analgesias offered at this time.  DIAGNOSIS:  Diverticulitis.  DIET:  Stay on low-residue diet for a few weeks.  FOLLOWUP:  Follow up with me in the office in 2-3 weeks.  CONDITION:  Stable.     [name] [name], MD     MBM/MEDQ  D:  07/16/2011  T:  07/16/2011  Job:  [id number]  cc:   Vania Rea. Jarold Motto, MD, FACG, FACP, FAGA [address] [place] [address]  [name]. [name], MD,FACP,FCCP [phone] [address] [place] [address]  Electronically Signed by Luretha Murphy MD on 07/29/2011 11:01:34 PM

## 2011-08-01 LAB — ANAEROBIC CULTURE

## 2011-08-01 LAB — DIFFERENTIAL
Basophils Absolute: 0
Basophils Relative: 0
Eosinophils Absolute: 0.3
Eosinophils Relative: 3
Monocytes Absolute: 0.7
Neutro Abs: 6.7

## 2011-08-01 LAB — CULTURE, ROUTINE-ABSCESS

## 2011-08-01 LAB — BASIC METABOLIC PANEL
CO2: 28
Calcium: 9.6
Chloride: 107
Glucose, Bld: 112 — ABNORMAL HIGH
Sodium: 140

## 2011-08-01 LAB — CBC
Hemoglobin: 14.8
MCHC: 33.5
MCHC: 34.4
MCV: 90.2
Platelets: 184
RDW: 12.4
RDW: 12.7

## 2011-08-05 NOTE — H&P (Signed)
[name], [name] NO.:  [id number]  MEDICAL RECORD NO.:  [id number]  LOCATION:  1510                         FACILITY:  [facility]  PHYSICIAN:  Jamie Park. [name], MD  DATE OF BIRTH:  1940-12-20  DATE OF ADMISSION:  07/07/2011 DATE OF DISCHARGE:  07/17/2011                             HISTORY & PHYSICAL   ADMITTING DIAGNOSIS:  Left lower quadrant abdominal pain with obstruction, rule out carcinoma versus diverticulitis.  HISTORY:  This 70 year old white female was seen in the office urgently, referred Dr. Eloise [name] because of a longstanding history of diverticular disease with increasing pain and a fairly necrotic-looking stricture in her sigmoid colon that was worrisome for a malignancy.  She was seen in the office and informed consent was obtained regarding laparoscopically assisted sigmoid colectomy and she was added on as a third case on Monday, bariatric day to get this done as she was quite concerned.  She was left on a liquid diet out of the office and a bowel prep was ordered.  PAST MEDICAL HISTORY:  Significant with: 1. History of previous coronary artery disease with ischemic     cardiomyopathy. 2. She has had some previous anterior MI in 1990 treated with tPA and     had percutaneous transluminal coronary angioplasty in 1990. 3. She has had stents placed subsequently on the circumflex in 2010. 4. She also has significant COPD, followed by [name], some     asthmatic bronchitis, possibly related to her 40 pack-year smoking     history, which she quit in 1999. 5. She has also had a previous history of peptic ulcer disease,     gastroesophageal reflux disease, some osteoarthritis, osteopenia.  PAST SURGICAL HISTORY:  Includes: 1. Previous total abdominal hysterectomy, bilateral salpingo-     oophorectomy. 2. She has had appendectomy. 3. She has had left ankle fracture with repair. 4. She has had bilateral breast augmentation, bladder surgery,  and     vein stripping for varicose veins as well as panniculectomy.  ALLERGIES:  As mentioned to: 1. SULFA. 2. CODEINE. 3. KEFLEX. 4. LEXAPRO.  MEDICATIONS:  Include: 1. Albuterol 2.5 mg inhaler q.6. 2. Artificial Tears. 3. Advair inhaler 250/50, 1 puff b.i.d. 4. Claritin. 5. Toprol-XL. 6. Spiriva.  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  REVIEW OF SYSTEMS:  Pertinent to the history above and nothing else related.  PHYSICAL EXAM:  GENERAL:  Pleasant, well-developed, well-maintained white female. VITAL SIGNS:  Afebrile, heart rate 80, respirations 18, blood pressure 112/64. HEENT:  Normal.  Sclerae nonicteric.  Pupils equal, round, reactive to light. NECK:  Supple.  No masses. CHEST:  Clear to auscultation. HEART:  Sinus rhythm without murmurs or gallops. ABDOMEN:  Soft.  Some tenderness in the left lower quadrant. EXTREMITIES:  Full range of motion.  No cyanosis, edema, or clubbing.  CT scan shows a strictured area in the sigmoid colon.  Attempts to biopsy this showed nothing but fibrotic tissue, so hopefully this means this is not going to be malignancy.  PLAN:  Laparoscopically assisted sigmoid colectomy.     Jamie Park [name],  MD     MBM/MEDQ  D:  07/29/2011  T:  07/30/2011  Job:  [id number]  Electronically Signed by Jamie Murphy MD on 08/05/2011 05:52:43 AM

## 2011-08-05 NOTE — Discharge Summary (Signed)
  [name], Jamie NO.:  [id number]  MEDICAL RECORD NO.:  [id number]  LOCATION:  1510                         FACILITY:  [facility]  PHYSICIAN:  [name]. [name], MD  DATE OF BIRTH:  05/13/1941  DATE OF ADMISSION:  07/07/2011 DATE OF DISCHARGE:  07/17/2011                              DISCHARGE SUMMARY   There is actually a discharge summary dictated through September 26 when I anticipated her discharge, but Cardiology kept her over.  The patient subsequent to the 26th underwent further cardiac workup and was not felt and needed to be intervened on.  She had some ventricular arrhythmias which were not deemed necessary for further evaluation.  PROCEDURE:  Laparoscopically-assisted sigmoid colectomy for diverticulitis.  She had fairly slow progression due to some underlying issues including her COPD, underlying heart disease.  After Cardiology cleared her on September 27, she was discharged home to be followed up in the office by me.  CONDITION:  Improved and stable.  Pathology report showed diverticulitis.  No evidence of cancer.     [name] [name], MD     MBM/MEDQ  D:  07/29/2011  T:  07/30/2011  Job:  [id number]  Electronically Signed by Luretha Murphy MD on 08/05/2011 05:52:40 AM

## 2011-08-22 ENCOUNTER — Telehealth: Payer: Self-pay | Admitting: Pulmonary Disease

## 2011-08-22 MED ORDER — TIOTROPIUM BROMIDE MONOHYDRATE 18 MCG IN CAPS: 18.0000 ug | ORAL_CAPSULE | Freq: Every day | RESPIRATORY_TRACT | Status: DC

## 2011-08-22 NOTE — Telephone Encounter (Signed)
90 day spiriva rx sent to [facility].  lmomtcb to inform pt of this.

## 2011-08-22 NOTE — Telephone Encounter (Signed)
Patient aware spiriva was sent to [facility].  [facility]

## 2011-08-28 ENCOUNTER — Ambulatory Visit (INDEPENDENT_AMBULATORY_CARE_PROVIDER_SITE_OTHER): Payer: Medicare Other | Admitting: Surgery

## 2011-08-28 ENCOUNTER — Encounter (INDEPENDENT_AMBULATORY_CARE_PROVIDER_SITE_OTHER): Payer: Self-pay | Admitting: Surgery

## 2011-08-28 VITALS — BP 122/72 | HR 72 | Temp 97.2°F | Resp 16 | Ht 64.5 in | Wt 155.0 lb

## 2011-08-28 DIAGNOSIS — Z9049 Acquired absence of other specified parts of digestive tract: Secondary | ICD-10-CM

## 2011-08-28 DIAGNOSIS — Z9889 Other specified postprocedural states: Secondary | ICD-10-CM

## 2011-08-28 NOTE — Progress Notes (Signed)
[name] 70 y.o.  Body mass index is 26.19 kg/(m^2).  Patient Active Problem List  Diagnoses  . INFECTIOUS DIARRHEA  . DIARRHEA-PRESUMED INFECTIOUS  . INFECTION, STAPHYLOCOCCUS NEC  . CANDIDIASIS  . HYPERLIPIDEMIA-MIXED  . EXTERNAL OTITIS  . BENIGN POSITIONAL VERTIGO  . MYOCARDIAL INFARCTION, HX OF  . CORONARY ARTERY DISEASE  . CAD, NATIVE VESSEL  . CARDIOMYOPATHY, ISCHEMIC  . CVA  . ACUTE BRONCHITIS  . COPD  . ACUT GASTR ULCER W/PERF W/O MENTION OBSTRUCTION  . GASTRIC ULCER  . Diverticulosis of Colon (without Mention of Hemorrhage)  . DIVERTICULITIS-COLON  . CELLULITIS/ABSCESS NOS  . WOUND INFECTION  . ARTHRITIS, LEFT SHOULDER  . OSTEOPENIA  . ABDOMINAL PAIN, LEFT LOWER QUADRANT  . Abdominal pain, generalized  . HYPOXEMIA  . FRACTURE, ANKLE, RIGHT  . CEREBROVASCULAR ACCIDENT, HX OF  . Personal History of Colonic Polyps  . DIVERTICULITIS, HX OF  . HYPERTENSION, BENIGN  . Hypertension  . Hyperlipemia  . Other general symptoms   . Iron deficiency anemia, unspecified  . Constipation, acute  . History of peptic ulcer disease  . Colitis  . Atypical back pain  . Colon cancer    Allergies  Allergen Reactions  . Lexapro     N/v, hot flashes, SOB  . Codeine     REACTION: NAUSEA-HAPPENED IN 20'S-PATIENT NOT SURE REALLY ALLERGIC  . Keflex     cdiff  . Sulfonamide Derivatives     Past Surgical History  Procedure Date  . Total abdominal hysterectomy w/ bilateral salpingoophorectomy   . Angioplasty   . Total shoulder arthroplasty 2012    left  . Orif ankle fracture 2007    right  . Breast enhancement surgery     bilateral  . Martial megeti     bladder  . Varicose vein surgery   . Ptca   . Colon surgery 07/07/11    sigmoid colectomy   Jamie Melnick, MD, MD No diagnosis found.  [name] returns today in followup after her laparoscopically assisted sigmoid colectomy. Her incision has healed fine. It's a small midline incision below her umbilicus.  Since she has had a previous abdominoplasty the incision does not make her happy but I think it looks good from a standpoint..  Her pathology report showed diverticulitis with associated pericolonic inflammation. Her bowels have been going much better and she is feeling very good. I plan to see her again in 2 months in routine followup. I gave her some literature about LAP-BAND for her niece.  Return to months Matt B. [name], MD, [facility] [phone] beeper [phone]  08/28/2011 2:08 PM

## 2011-08-29 ENCOUNTER — Telehealth: Payer: Self-pay | Admitting: Pulmonary Disease

## 2011-08-29 NOTE — Telephone Encounter (Signed)
Called and spoke with pt and she stated that her refills go mixed up with the pharmacy and she will not get these until the end of next week and she will be out of these by then.  Samples of advair, spiriva and ventolin have been left up front.

## 2011-09-22 ENCOUNTER — Ambulatory Visit (INDEPENDENT_AMBULATORY_CARE_PROVIDER_SITE_OTHER): Payer: Medicare Other | Admitting: Pulmonary Disease

## 2011-09-22 ENCOUNTER — Encounter: Payer: Self-pay | Admitting: Pulmonary Disease

## 2011-09-22 VITALS — BP 112/70 | HR 81 | Temp 97.7°F | Ht 69.0 in | Wt 155.6 lb

## 2011-09-22 DIAGNOSIS — J449 Chronic obstructive pulmonary disease, unspecified: Secondary | ICD-10-CM

## 2011-09-22 NOTE — Progress Notes (Signed)
  Subjective:    Patient ID: [name], female    DOB: 18-Mar-1941, 70 y.o.   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  She smoked for 40 years and quit in 1999. Advair worked better than symbicort.  Desaturates to 90% on ambulation 2/11   November , 2010 -Acute visit for 'bronchitis >> improved with avelox & low dose steroids.  Sick visit 6/11 chest cold - cefdinir helped ! (avelox too expensive) Completed course of steroids.  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  09/22/2011 Underwent colectomy for diverticulitis [facility]) Has recovered well- had post op rapid HR. Breathing ok, no cough, wheeze, compliant with noct O2 Does not want to enroll in rehab. Has lost 5-10 lbs   Review of Systems Patient denies significant dyspnea,cough, hemoptysis,  chest pain, palpitations, pedal edema, orthopnea, paroxysmal nocturnal dyspnea, lightheadedness, nausea, vomiting, abdominal or  leg pains      Objective:   Physical Exam  Gen. Pleasant, well-nourished, in no distress ENT - no lesions, no post nasal drip Neck: No JVD, no thyromegaly, no carotid bruits Lungs: no use of accessory muscles, no dullness to percussion, clear without rales or rhonchi  Cardiovascular: Rhythm regular, heart sounds  normal, no murmurs or gallops, no peripheral edema Musculoskeletal: No deformities, no cyanosis or clubbing        Assessment & Plan:

## 2011-09-22 NOTE — Patient Instructions (Signed)
Your COPD is stable

## 2011-09-23 NOTE — Assessment & Plan Note (Signed)
Ct advair,spiriva, nocturnal O2 She remains interested in a research study - unfortunately did not qualify for the one we had ongoing. Discussed roflimulast - does not need this at tthis time.

## 2011-09-30 ENCOUNTER — Other Ambulatory Visit: Payer: Self-pay | Admitting: Internal Medicine

## 2011-09-30 DIAGNOSIS — Z1231 Encounter for screening mammogram for malignant neoplasm of breast: Secondary | ICD-10-CM

## 2011-10-01 ENCOUNTER — Telehealth: Payer: Self-pay | Admitting: *Deleted

## 2011-10-01 ENCOUNTER — Encounter: Payer: Self-pay | Admitting: *Deleted

## 2011-10-01 DIAGNOSIS — R197 Diarrhea, unspecified: Secondary | ICD-10-CM

## 2011-10-01 NOTE — Telephone Encounter (Signed)
Per Dr Jarold Motto he would like pt to have C diff by pcr before her office visit next Tuesday. She will come tomorrow and get containers and do cultures and order is in Epic.

## 2011-10-02 ENCOUNTER — Emergency Department (HOSPITAL_COMMUNITY)
Admission: EM | Admit: 2011-10-02 | Discharge: 2011-10-02 | Disposition: A | Discharge status: Home / Self Care | Payer: Medicare Other | Attending: Emergency Medicine | Admitting: Emergency Medicine

## 2011-10-02 ENCOUNTER — Encounter (HOSPITAL_COMMUNITY): Payer: Self-pay | Admitting: *Deleted

## 2011-10-02 ENCOUNTER — Emergency Department (HOSPITAL_COMMUNITY): Payer: Medicare Other

## 2011-10-02 ENCOUNTER — Encounter (INDEPENDENT_AMBULATORY_CARE_PROVIDER_SITE_OTHER): Payer: Self-pay | Admitting: Surgery

## 2011-10-02 DIAGNOSIS — S0003XA Contusion of scalp, initial encounter: Secondary | ICD-10-CM | POA: Insufficient documentation

## 2011-10-02 DIAGNOSIS — I252 Old myocardial infarction: Secondary | ICD-10-CM | POA: Insufficient documentation

## 2011-10-02 DIAGNOSIS — W06XXXA Fall from bed, initial encounter: Secondary | ICD-10-CM | POA: Insufficient documentation

## 2011-10-02 DIAGNOSIS — E785 Hyperlipidemia, unspecified: Secondary | ICD-10-CM | POA: Insufficient documentation

## 2011-10-02 DIAGNOSIS — J4489 Other specified chronic obstructive pulmonary disease: Secondary | ICD-10-CM | POA: Insufficient documentation

## 2011-10-02 DIAGNOSIS — I10 Essential (primary) hypertension: Secondary | ICD-10-CM | POA: Insufficient documentation

## 2011-10-02 DIAGNOSIS — I2510 Atherosclerotic heart disease of native coronary artery without angina pectoris: Secondary | ICD-10-CM | POA: Insufficient documentation

## 2011-10-02 DIAGNOSIS — IMO0002 Reserved for concepts with insufficient information to code with codable children: Secondary | ICD-10-CM | POA: Insufficient documentation

## 2011-10-02 DIAGNOSIS — J449 Chronic obstructive pulmonary disease, unspecified: Secondary | ICD-10-CM | POA: Insufficient documentation

## 2011-10-02 DIAGNOSIS — W19XXXA Unspecified fall, initial encounter: Secondary | ICD-10-CM

## 2011-10-02 DIAGNOSIS — Z8673 Personal history of transient ischemic attack (TIA), and cerebral infarction without residual deficits: Secondary | ICD-10-CM | POA: Insufficient documentation

## 2011-10-02 DIAGNOSIS — J342 Deviated nasal septum: Secondary | ICD-10-CM | POA: Insufficient documentation

## 2011-10-02 DIAGNOSIS — S022XXA Fracture of nasal bones, initial encounter for closed fracture: Secondary | ICD-10-CM | POA: Insufficient documentation

## 2011-10-02 DIAGNOSIS — R51 Headache: Secondary | ICD-10-CM | POA: Insufficient documentation

## 2011-10-02 LAB — POCT I-STAT, CHEM 8
BUN: 24 mg/dL — ABNORMAL HIGH (ref 6–23)
Creatinine, Ser: 1 mg/dL (ref 0.50–1.10)
Hemoglobin: 13.3 g/dL (ref 12.0–15.0)
Potassium: 4 mEq/L (ref 3.5–5.1)
Sodium: 146 mEq/L — ABNORMAL HIGH (ref 135–145)
TCO2: 25 mmol/L (ref 0–100)

## 2011-10-02 LAB — CBC
Hemoglobin: 12.8 g/dL (ref 12.0–15.0)
Platelets: 190 10*3/uL (ref 150–400)
RBC: 4.23 MIL/uL (ref 3.87–5.11)

## 2011-10-02 MED ORDER — OXYCODONE-ACETAMINOPHEN 5-325 MG PO TABS: 1.0000 | ORAL_TABLET | Freq: Once | ORAL | Status: DC

## 2011-10-02 MED ORDER — AMOXICILLIN 500 MG PO CAPS: 500.0000 mg | ORAL_CAPSULE | Freq: Three times a day (TID) | ORAL | Status: AC

## 2011-10-02 MED ORDER — OXYCODONE-ACETAMINOPHEN 5-325 MG PO TABS
1.0000 | ORAL_TABLET | Freq: Once | ORAL | Status: AC
  Administered 2011-10-02: 1 via ORAL
  Filled 2011-10-02: qty 1

## 2011-10-02 MED ORDER — OXYCODONE-ACETAMINOPHEN 5-325 MG PO TABS: 1.0000 | ORAL_TABLET | Freq: Four times a day (QID) | ORAL | Status: AC | PRN

## 2011-10-02 NOTE — ED Provider Notes (Signed)
History     CSN: [id number] Arrival date & time: 10/02/2011 10:13 AM   First MD Initiated Contact with Patient 10/02/11 1151      Chief Complaint  Patient presents with  . Fall  . Head Injury    (Consider location/radiation/quality/duration/timing/severity/associated sxs/prior treatment) HPI Comments: Pt had a fall out of ben this AM around 9:00 am. States her bed is high with steps leading to it. She was unable to catch her fall at all. Impact was on right sd of face.  C/o severe HA & nose pain. Takes 81 mg of aspirin a day. Denies N/V, change in vision. BP normally runs low. No LOC, but states was disoriented after fall.     Patient is a 70 y.o. female presenting with fall and head injury.  Fall Pertinent negatives include no fever, no numbness, no abdominal pain and no headaches.  Head Injury  Pertinent negatives include no numbness and no weakness.    Past Medical History  Diagnosis Date  . CAD (coronary artery disease)   . CVA (cerebral vascular accident)   . Myocardial infarct   . Other emphysema   . Unspecified arthropathy, shoulder region   . Gastric ulcer, unspecified as acute or chronic, without mention of hemorrhage, perforation, or obstruction   . Benign paroxysmal positional vertigo   . COPD (chronic obstructive pulmonary disease)   . Hypertension   . Diverticula of colon   . Osteopenia   . Personal history of colonic polyps 2007    tubular adenoma high grade dysplasia  . Diverticulitis   . Hyperlipemia   . Fracture of ankle, bimalleolar, right, closed   . Hypoxemia   . Tubular adenoma   . Benign positional vertigo   . Arthritis     left shoulder    Past Surgical History  Procedure Date  . Total abdominal hysterectomy w/ bilateral salpingoophorectomy   . Angioplasty   . Total shoulder arthroplasty 2012    left  . Orif ankle fracture 2007    right  . Breast enhancement surgery     bilateral  . Martial megeti     bladder  . Varicose vein  surgery   . Ptca   . Sigmoidectomy 07/07/11    sigmoid colectomy    Family History  Problem Relation Age of Onset  . Breast cancer Sister   . Cancer Sister     breast  . Colon cancer Neg Hx   . Diabetes Paternal Grandmother   . Uterine cancer Paternal Grandmother   . Heart disease Mother   . Heart disease Father   . Breast cancer Sister   . Cancer Sister     breast  . Pancreatic cancer Other     2nd cousin  . Colon polyps Sister   . Diabetes Maternal Uncle   . Heart disease Maternal Grandfather   . Heart disease Paternal Grandfather     History  Substance Use Topics  . Smoking status: Former Smoker    Quit date: 10/20/1997  . Smokeless tobacco: Never Used  . Alcohol Use: Yes     occasionally    OB History    Grav Para Term Preterm Abortions TAB SAB Ect Mult Living                  Review of Systems  Constitutional: Negative for fever, chills and appetite change.  HENT: Negative for congestion, neck pain, neck stiffness and postnasal drip.        Difficulty  breathing through R nostril. Reports having deviated septum and this is chronic, but worse since fall   Eyes: Negative for visual disturbance.  Respiratory: Negative for shortness of breath.   Cardiovascular: Negative for chest pain and leg swelling.  Gastrointestinal: Negative for abdominal pain.  Genitourinary: Negative for dysuria, urgency and frequency.  Skin: Negative for rash.  Neurological: Negative for dizziness, syncope, weakness, light-headedness, numbness and headaches.  Psychiatric/Behavioral: Negative for confusion and decreased concentration.  All other systems reviewed and are negative.    Allergies  Lexapro; Codeine; Keflex; and Sulfonamide derivatives  Home Medications   Current Outpatient Rx  Name Route Sig Dispense Refill  . ALBUTEROL SULFATE HFA 108 (90 BASE) MCG/ACT IN AERS Inhalation Inhale 2 puffs into the lungs every 6 (six) hours as needed for wheezing. 3 Inhaler 3  .  ALENDRONATE SODIUM 70 MG PO TABS Oral Take 70 mg by mouth every 7 (seven) days. Wednesday Take with a full glass of water on an empty stomach.    . ASPIRIN 81 MG PO TABS Oral Take 81 mg by mouth daily.      Marland Kitchen CALCIUM CARBONATE-VITAMIN D 500-200 MG-UNIT PO TABS Oral Take 1 tablet by mouth daily.      Marland Kitchen CASANTHRANOL-DOCUSATE SODIUM 30-100 MG PO CAPS Oral Take 1 tablet by mouth daily. For constipation    . VITAMIN D3 2000 UNITS PO TABS Oral Take 1 tablet by mouth 2 (two) times daily.     Marland Kitchen CRANBERRY-OLIVE LEAF 365-50 MG PO CAPS Oral Take 1 capsule by mouth every other day.     Marland Kitchen FLUTICASONE-SALMETEROL 250-50 MCG/DOSE IN AEPB Inhalation Inhale 1 puff into the lungs every 12 (twelve) hours. 3 each 1  . GARLIC 1250 MG PO TABS Oral Take 1 tablet by mouth every other day.     Marland Kitchen LORATADINE 10 MG PO TABS Oral Take 10 mg by mouth daily.      . MELOXICAM 15 MG PO TABS Oral Take 15 mg by mouth daily.      Marland Kitchen METOPROLOL SUCCINATE ER 25 MG PO TB24 Oral Take 25 mg by mouth at bedtime.     . MULTIVITAMINS PO CAPS Oral Take 0.5 capsules by mouth 2 (two) times daily.     Marland Kitchen FISH OIL 1200 MG PO CAPS Oral Take 1 capsule by mouth daily.     Marland Kitchen PRESCRIPTION MEDICATION Oral Take 1 capsule by mouth daily. Patient is in a drug study--unsure if she takes a placebo/drug     . PROBIOTIC PO Oral Take 1 tablet by mouth daily.      Marland Kitchen SIMVASTATIN 40 MG PO TABS Oral Take 40 mg by mouth at bedtime.      Marland Kitchen TIOTROPIUM BROMIDE MONOHYDRATE 18 MCG IN CAPS Inhalation Place 1 capsule (18 mcg total) into inhaler and inhale daily. 90 capsule 0  . VITAMIN B-12 1000 MCG PO TABS Oral Take 1,000 mcg by mouth daily.      Marland Kitchen L-LYSINE 500 MG PO TABS Oral Take 1 tablet by mouth every other day.       BP 106/46  Pulse 64  Temp(Src) 97.5 F (36.4 C) (Oral)  Resp 18  Wt 154 lb (69.854 kg)  SpO2 95%  Physical Exam  Nursing note and vitals reviewed. Constitutional: She is oriented to person, place, and time. She appears well-developed and  well-nourished. No distress.  HENT:  Head: Normocephalic. Head is with abrasion, with contusion and with right periorbital erythema. Head is without raccoon's eyes, without Battle's  sign and without laceration.    Nose: Sinus tenderness, nasal deformity and septal deviation (Chronic ) present. No nose lacerations or nasal septal hematoma.    Eyes:       Normal appearance  Neck: Normal range of motion.  Cardiovascular: Normal rate, regular rhythm and intact distal pulses.   Pulmonary/Chest: Effort normal and breath sounds normal.  Abdominal: Soft. There is no tenderness.  Musculoskeletal: Normal range of motion.  Neurological: She is alert and oriented to person, place, and time. She has normal reflexes. She displays no tremor. No cranial nerve deficit or sensory deficit. She displays a negative Romberg sign. Coordination and gait normal.       5/5 and equal upper and lower extremity strength.  No past pointing.  No pronator drift.  No nystagmus.   Skin: Skin is warm and dry. No rash noted.  Psychiatric: She has a normal mood and affect. Her behavior is normal.    ED Course  Procedures (including critical care time)  Labs Reviewed  POCT I-STAT, CHEM 8 - Abnormal; Notable for the following:    Sodium 146 (*)    BUN 24 (*)    All other components within normal limits  CBC  I-STAT, CHEM 8   Ct Head Wo Contrast  10/02/2011  *RADIOLOGY REPORT*  Clinical Data:  Fall, laceration to right forehead.  Headache.  CT HEAD WITHOUT CONTRAST CT MAXILLOFACIAL WITHOUT CONTRAST  Technique:  Multidetector CT imaging of the head and maxillofacial structures were performed using the standard protocol without intravenous contrast. Multiplanar CT image reconstructions of the maxillofacial structures were also generated.  Comparison:  PET CT 05/21/2010  CT HEAD  Findings: Old lacunar infarct in the left cerebellar hemisphere, stable. No acute intracranial abnormality.  Specifically, no hemorrhage,  hydrocephalus, mass lesion, acute infarction, or significant intracranial injury.  No acute calvarial abnormality. Visualized paranasal sinuses and mastoids clear.  Orbital soft tissues unremarkable.  Orbital soft tissues unremarkable.  IMPRESSION: No acute intracranial abnormality.  CT MAXILLOFACIAL  Findings:   Possible subtle fracture of the right nasal bone at the nasal maxillary suture line.  Mild overlying soft tissue swelling. Orbital walls are intact.  Zygomatic arches are normal.  Paranasal sinuses are clear.  Mandible is intact.  Orbital soft tissues normal.  IMPRESSION: Question subtle right nasal bone fracture.  Overlying soft tissue swelling.  No additional acute bony abnormality.  Original Report Authenticated By: Cyndie Chime, M.D.   Ct Maxillofacial Wo Cm  10/02/2011  *RADIOLOGY REPORT*  Clinical Data:  Fall, laceration to right forehead.  Headache.  CT HEAD WITHOUT CONTRAST CT MAXILLOFACIAL WITHOUT CONTRAST  Technique:  Multidetector CT imaging of the head and maxillofacial structures were performed using the standard protocol without intravenous contrast. Multiplanar CT image reconstructions of the maxillofacial structures were also generated.  Comparison:  PET CT 05/21/2010  CT HEAD  Findings: Old lacunar infarct in the left cerebellar hemisphere, stable. No acute intracranial abnormality.  Specifically, no hemorrhage, hydrocephalus, mass lesion, acute infarction, or significant intracranial injury.  No acute calvarial abnormality. Visualized paranasal sinuses and mastoids clear.  Orbital soft tissues unremarkable.  Orbital soft tissues unremarkable.  IMPRESSION: No acute intracranial abnormality.  CT MAXILLOFACIAL  Findings:   Possible subtle fracture of the right nasal bone at the nasal maxillary suture line.  Mild overlying soft tissue swelling. Orbital walls are intact.  Zygomatic arches are normal.  Paranasal sinuses are clear.  Mandible is intact.  Orbital soft tissues normal.   IMPRESSION: Question  subtle right nasal bone fracture.  Overlying soft tissue swelling.  No additional acute bony abnormality.  Original Report Authenticated By: Cyndie Chime, M.D.     No diagnosis found.  Pt seen by Dr. Bebe Shaggy who agrees with below plan.   MDM  CT head, facialmaxilla and labs ordered. Possible nasal frx but no acute bleeds. Pt dc w pain medication and f-u with ENT Dr. Annalee Genta. Pt dc with amoxicillin for small possibility of open fx and pt instructed to ice and use pain meds as needed.         [place], [place] 10/02/11 1225

## 2011-10-02 NOTE — ED Notes (Signed)
Pt states "rolled out of bed hitting head on nightstand"; pt presents with bruising to right eye, abrasions to nose, pt denies LOC

## 2011-10-02 NOTE — ED Provider Notes (Deleted)
    [place], [place] 10/02/11 1059  [place], [place] 10/02/11 1213

## 2011-10-02 NOTE — ED Notes (Signed)
Ice pack provided

## 2011-10-02 NOTE — ED Provider Notes (Signed)
Pt with facial hematoma, small nasal fx, but no septal hematoma No cspine tenderness GCS 15 She is well appearing Will f/u with her ENT  Joya Gaskins, MD 10/02/11 1226

## 2011-10-03 NOTE — ED Provider Notes (Signed)
Medical screening examination/treatment/procedure(s) were performed by non-physician practitioner and as supervising physician I was immediately available for consultation/collaboration.   Antonisha Waskey A Elder Davidian, MD 10/03/11 1416 

## 2011-10-06 ENCOUNTER — Other Ambulatory Visit: Payer: Medicare Other

## 2011-10-06 DIAGNOSIS — R197 Diarrhea, unspecified: Secondary | ICD-10-CM

## 2011-10-07 ENCOUNTER — Ambulatory Visit: Payer: Medicare Other | Admitting: Gastroenterology

## 2011-10-08 ENCOUNTER — Telehealth: Payer: Self-pay | Admitting: Gastroenterology

## 2011-10-08 LAB — CLOSTRIDIUM DIFFICILE BY PCR: Toxigenic C. Difficile by PCR: NOT DETECTED

## 2011-10-08 MED ORDER — METRONIDAZOLE 250 MG PO TABS: 250.0000 mg | ORAL_TABLET | Freq: Three times a day (TID) | ORAL | Status: AC

## 2011-10-08 MED ORDER — SACCHAROMYCES BOULARDII 250 MG PO CAPS: 250.0000 mg | ORAL_CAPSULE | Freq: Two times a day (BID) | ORAL | Status: AC

## 2011-10-08 NOTE — Telephone Encounter (Signed)
Per verbal order pt can start flagyl 250 tid for one week and florastor for one week, pt aware and rx sent to pharm

## 2011-10-08 NOTE — Telephone Encounter (Signed)
Patients c diff is back and it is NEG.

## 2011-10-08 NOTE — Telephone Encounter (Signed)
Pt called in for results of her CDIFF. She is going out of town tomorrow and will be gone until 10/17/11; she may need an antibiotic.  Explained to pt the results aren't back and sometimes it may take several days. Explained if we get the results back, we can call the script in to a pharmacy in [place].   Pt wants to know if we can go ahead and order the antibiotic so she can take it with her and we can contact her when results are in so she can start the med. Please advise. Thanks.

## 2011-10-23 ENCOUNTER — Ambulatory Visit: Payer: Medicare Other | Admitting: Gastroenterology

## 2011-10-24 ENCOUNTER — Encounter: Payer: Self-pay | Admitting: *Deleted

## 2011-10-28 ENCOUNTER — Encounter: Payer: Self-pay | Admitting: Gastroenterology

## 2011-10-28 ENCOUNTER — Telehealth: Payer: Self-pay | Admitting: Pulmonary Disease

## 2011-10-28 ENCOUNTER — Ambulatory Visit: Payer: Medicare Other

## 2011-10-28 ENCOUNTER — Ambulatory Visit (INDEPENDENT_AMBULATORY_CARE_PROVIDER_SITE_OTHER): Payer: Medicare Other | Admitting: Gastroenterology

## 2011-10-28 VITALS — BP 110/70 | HR 76 | Ht 64.5 in | Wt 156.0 lb

## 2011-10-28 DIAGNOSIS — R197 Diarrhea, unspecified: Secondary | ICD-10-CM

## 2011-10-28 DIAGNOSIS — T8189XA Other complications of procedures, not elsewhere classified, initial encounter: Secondary | ICD-10-CM

## 2011-10-28 NOTE — Telephone Encounter (Signed)
2 boxes of Spiriva and 1 box of Advair 250 placed at front for pt pick up.

## 2011-10-28 NOTE — Patient Instructions (Signed)
Continue florastor for another month and then stop.

## 2011-10-28 NOTE — Progress Notes (Signed)
History of Present Illness: This is a complicated 71 year old Caucasian female who recently had sigmoid resection by Dr. 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 She called our office and had severe watery diarrhea and was placed on metronidazole and Florstar with good success. She currently is asymptomatic in terms of diarrhea or abdominal pain.    Current Medications, Allergies, Past Medical History, Past Surgical History, Family History and Social History were reviewed in [employer] record.   Assessment and plan: Probable antibiotic induced diarrhea which has responded to standard therapy. I have urged her to avoid broad-spectrum antibiotics if possible and to continue Florstar for another month. She is on multiple medications, and has multiple drug allergies. Repeat stool for C. difficile toxin by PCR was negative.  Please copy her primary care physician, referring physician, and pertinent subspecialists. No diagnosis found.

## 2011-10-29 ENCOUNTER — Ambulatory Visit
Admission: RE | Admit: 2011-10-29 | Discharge: 2011-10-29 | Disposition: A | Discharge status: Home / Self Care | Payer: Medicare Other | Source: Ambulatory Visit | Attending: Internal Medicine | Admitting: Internal Medicine

## 2011-10-29 DIAGNOSIS — Z1231 Encounter for screening mammogram for malignant neoplasm of breast: Secondary | ICD-10-CM

## 2011-11-14 ENCOUNTER — Encounter (INDEPENDENT_AMBULATORY_CARE_PROVIDER_SITE_OTHER): Payer: Medicare Other | Admitting: Surgery

## 2011-12-15 ENCOUNTER — Ambulatory Visit (INDEPENDENT_AMBULATORY_CARE_PROVIDER_SITE_OTHER): Payer: Medicare Other | Admitting: Pulmonary Disease

## 2011-12-15 ENCOUNTER — Encounter: Payer: Self-pay | Admitting: Pulmonary Disease

## 2011-12-15 VITALS — BP 110/74 | HR 69 | Temp 97.8°F | Ht 68.0 in | Wt 162.4 lb

## 2011-12-15 DIAGNOSIS — J449 Chronic obstructive pulmonary disease, unspecified: Secondary | ICD-10-CM

## 2011-12-15 MED ORDER — HYDROCODONE-HOMATROPINE 5-1.5 MG/5ML PO SYRP: ORAL_SOLUTION | ORAL | Status: DC

## 2011-12-15 MED ORDER — PREDNISONE (PAK) 10 MG PO TABS: ORAL_TABLET | ORAL | Status: DC

## 2011-12-15 MED ORDER — AZITHROMYCIN 250 MG PO TABS: ORAL_TABLET | ORAL | Status: AC

## 2011-12-15 NOTE — Progress Notes (Signed)
  Subjective:    Patient ID: [name], female    DOB: 1941-01-26, 71 y.o.   MRN: [id number]  HPI  70/F, ex smoker, realtor with Gold stg 3 COPD FEV1 1.0 L (48%), on nocturnal O2 since 10/10 for FU.  She smoked for 40 years and quit in 1999. Advair worked better than symbicort.  Desaturates to 90% on ambulation 2/11  November , 2010 -Acute visit for 'bronchitis >> improved with avelox & low dose steroids.  Sick visit 6/11 chest cold - cefdinir helped ! (avelox too expensive) Completed course of steroids.  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  09/22/2011  Underwent colectomy for diverticulitis [facility]) Has recovered well- had post op rapid HR.  Does not want to enroll in rehab. Has lost 5-10 lbs  12/15/2011 C/o URI symptoms - nasal watery discharge, mild wheeze, increase dyspnea - started with a sore throat - Afraid this will develop into bronchitis Breathing ok, no cough, wheeze, compliant with noct O2   Review of Systems Patient denies significant dyspnea,cough, hemoptysis,  chest pain, palpitations, pedal edema, orthopnea, paroxysmal nocturnal dyspnea, lightheadedness, nausea, vomiting, abdominal or  leg pains      Objective:   Physical Exam  Gen. Pleasant, well-nourished, in no distress ENT - no lesions, no post nasal drip Neck: No JVD, no thyromegaly, no carotid bruits Lungs: no use of accessory muscles, no dullness to percussion, decreased BL without rales, few scattered  rhonchi  Cardiovascular: Rhythm regular, heart sounds  normal, no murmurs or gallops, no peripheral edema Musculoskeletal: No deformities, no cyanosis or clubbing        Assessment & Plan:

## 2011-12-15 NOTE — Assessment & Plan Note (Signed)
Treat as flare Prednisone 10 mg  - Take 2 tabs  daily with food  x 5 days, then 1 tab daily x 5days then stop. #20 Hycodan cough syrup 2 tsp twice daily as needed x 90ml Z-pak - start taking this antibiotic if phlegm turns yellow/ green Stay on claritin Call if no better in 5 days

## 2011-12-15 NOTE — Patient Instructions (Signed)
Prednisone 10 mg  - Take 2 tabs  daily with food  x 5 days, then 1 tab daily x 5days then stop. #20 Hycodan cough syrup 2 tsp twice daily as needed x 90ml Z-pak - start taking this antibiotic if phlegm turns yellow/ green Stay on claritin Call if no better in 5 days

## 2012-01-02 ENCOUNTER — Encounter (INDEPENDENT_AMBULATORY_CARE_PROVIDER_SITE_OTHER): Payer: Medicare Other | Admitting: Surgery

## 2012-01-06 ENCOUNTER — Other Ambulatory Visit: Payer: Self-pay | Admitting: Cardiovascular Disease

## 2012-01-06 MED ORDER — SIMVASTATIN 40 MG PO TABS: 40.0000 mg | ORAL_TABLET | Freq: Every day | ORAL | Status: DC

## 2012-01-06 NOTE — Telephone Encounter (Signed)
Patient wants med sent to [facility] sources rx.

## 2012-01-19 ENCOUNTER — Telehealth: Payer: Self-pay | Admitting: Cardiovascular Disease

## 2012-01-19 NOTE — Telephone Encounter (Signed)
Pt's refill for simvastatin 40 mg 90 day supply with 1 year refills was called into [facility] and should have been called into [facility] mail order asap she will be out

## 2012-01-20 ENCOUNTER — Other Ambulatory Visit: Payer: Self-pay

## 2012-01-20 MED ORDER — SIMVASTATIN 40 MG PO TABS: 40.0000 mg | ORAL_TABLET | Freq: Every day | ORAL | Status: DC

## 2012-02-04 ENCOUNTER — Telehealth: Payer: Self-pay | Admitting: Gastroenterology

## 2012-02-04 NOTE — Telephone Encounter (Signed)
lmom for pt to call back

## 2012-02-04 NOTE — Telephone Encounter (Signed)
Pt reports back and stomach pain since March. She was going out of town, thought she may have a UTI or kidney stone, so she went to [facility] on 01/17/12. She was given Cipro and was doing OK. A few days ago the pain came back to her stomach and back. She is having BMs although not daily, so she doesn't think it's diverticulitis. Her stools are very dark and almost black and she reports she has stomach pain with diverticulitis flares. She has no urinary s&s; burning or pain on urination, urgency, dark or foul smelling urine.Called and left a message on machine at [facility] to get lab and cx results. Please advise. Thanks.

## 2012-02-04 NOTE — Telephone Encounter (Signed)
If Dr. Jarold Motto is a office this should be referred to him. Otherwise, she should be seen in the office this week

## 2012-02-05 ENCOUNTER — Encounter: Payer: Self-pay | Admitting: Gastroenterology

## 2012-02-05 ENCOUNTER — Encounter: Payer: Self-pay | Admitting: Internal Medicine

## 2012-02-05 ENCOUNTER — Ambulatory Visit (INDEPENDENT_AMBULATORY_CARE_PROVIDER_SITE_OTHER): Payer: Medicare Other | Admitting: Internal Medicine

## 2012-02-05 VITALS — BP 114/60 | HR 81 | Temp 98.3°F | Wt 158.4 lb

## 2012-02-05 DIAGNOSIS — Z01818 Encounter for other preprocedural examination: Secondary | ICD-10-CM

## 2012-02-05 DIAGNOSIS — R102 Pelvic and perineal pain: Secondary | ICD-10-CM

## 2012-02-05 DIAGNOSIS — N949 Unspecified condition associated with female genital organs and menstrual cycle: Secondary | ICD-10-CM

## 2012-02-05 DIAGNOSIS — M549 Dorsalgia, unspecified: Secondary | ICD-10-CM

## 2012-02-05 DIAGNOSIS — R350 Frequency of micturition: Secondary | ICD-10-CM

## 2012-02-05 LAB — POCT URINALYSIS DIPSTICK
Bilirubin, UA: NEGATIVE
Glucose, UA: NEGATIVE
Nitrite, UA: NEGATIVE
Spec Grav, UA: 1.02
Urobilinogen, UA: 0.2

## 2012-02-05 MED ORDER — FENTANYL 75 MCG/HR TD PT72: 1.0000 | MEDICATED_PATCH | TRANSDERMAL | Status: AC

## 2012-02-05 NOTE — Telephone Encounter (Signed)
See note from 02-04-12

## 2012-02-05 NOTE — Telephone Encounter (Signed)
Regular office visit should suffice

## 2012-02-05 NOTE — Progress Notes (Signed)
Subjective:    Patient ID: [name], female    DOB: 1941/03/27, 71 y.o.   MRN: [id number]  HPI Back and suprapubic pain with urinary frequency Location: diffuse lower back pain Quality: constant pain that varies in quality such as sharp, stabbing, and aching Onset: March 27th, went to walk in clinic on March 30th - Cipro 500mg  (2tab daily) - had tablets left over from past UTI - took 22 tablets in total Worse with:no exacerbating factors,eweww  constant Better with: oxycodone - takes one tab at night to help with sleep - has it from Jan due to shoulder pain. She states the back pain is better if she leans forward while sitting Radiation: pt denies Trauma: pt denies Red Flags: Fecal/urinary incontinence: Pt states she occasionally has urine leakage but that is normal for her. Numbness/Weakness: pt denies Fever/chills/sweats: pt denies  PMH: March 28 - constipation X 1 week; colon surgery in Sept (removed part of colon and removed diverticulitis)  Stomach pain began around the same time as the back pain. Diverticulitis has improved despite the remaining pain. Pain is mostly located in the suprapubic region and is dull and throbbing. Pain is most severe at night when pt is laying down.   Pt is also experiencing urinary frequency. Pt denies pain with urination and states this is normal for patient when she had had UTI in the past. Pt has noticed change in urine color, it is very light yellow. Pt denies blood in urine however on March 30, urinalysis showed small leukocytes with large blood.  Review of Systems She describes straining to have a BM as well as dark stools without frank melena or rectal bleeding. She has regained 9 pounds following a 22 pound weight loss related to the surgery in September 2012     Objective:   Physical Exam General appearance is one of good health and nourishment w/o distress.Appears younger than stated age   Eyes: No conjunctival inflammation or scleral  icterus is present.  Oral exam: upper plate & lower partial; lips and gums are healthy appearing.There is no oropharyngeal erythema or exudate noted.   Heart:  Normal rate and regular rhythm. S1 and S2 normal without gallop, murmur, click, rub or other extra sounds     Lungs:Chest clear to auscultation; no wheezes, rhonchi,rales ,or rubs present.No increased work of breathing.   Abdomen: bowel sounds slightly decreased;tenderness diffusely especially suprapubic area without masses, organomegaly or hernias noted. No rebound   Musculoskeletal: No malalignment of the spine is noted. She is able to lie flat and sit up without help. Straight leg raising is negative bilaterally. Strength and tone are excellent. She has severe pain with percussion over the lumbosacral spine  Neuro: Deep tendon reflexes are normal. Gait including heel and toe walking is normal  Skin:Warm & dry.  Intact without suspicious lesions or rashes ; no jaundice or tenting  Lymphatic: No lymphadenopathy is noted about the head, neck, axilla, or inguinal areas.              Assessment & Plan:   #1 she has marked tenderness to percussion of the lumbosacral spine but there is no evidence of neuromuscular deficit on exam  #2 abdominal discomfort; she has diffuse tenderness greater in the suprapubic area  #3 urinary frequency; urine appears benign. Urine culture 01/17/12 was negative for bacterial growth.  #4 complicated history with extensive bowel surgery.  Plan: Unfortunately imaging will be necessary for better definition. Urology evaluation with possible cystoscopy  may be necessary with his clinical picture

## 2012-02-05 NOTE — Telephone Encounter (Signed)
Dr Patterson, please advise. Thanks. 

## 2012-02-05 NOTE — Patient Instructions (Signed)
.  Share results with all MDs seen.Please try to go on My Chart within the next 24 hours to allow me to release the results directly to you.

## 2012-02-05 NOTE — Telephone Encounter (Signed)
Did not know that pt had called again. Spoke with her to inform her Mike Gip, [place] can see her today or Dr Jarold Motto in the am. She would like the appt in am with Dr Jarold Motto. I asked if she had heard from Dr [name] and she had not. Per Dr Jarold Motto, ordered UA, CBC and ESR and informed pt.  Pt called back at 10:48am and stated Dr [name] will see her at 3pm today. Discussed with her getting the labs so they will be ready and she stated Dr Frederik Pear ofc told her not to get them, Dr [name] may not want labs. OK with pt to cancel labs and appt with Dr Jarold Motto.

## 2012-02-06 ENCOUNTER — Ambulatory Visit: Payer: Medicare Other | Admitting: Gastroenterology

## 2012-02-06 LAB — CREATININE, SERUM: Creatinine, Ser: 1.1 mg/dL (ref 0.4–1.2)

## 2012-02-06 LAB — BUN: BUN: 24 mg/dL — ABNORMAL HIGH (ref 6–23)

## 2012-02-08 LAB — URINE CULTURE

## 2012-02-10 ENCOUNTER — Ambulatory Visit (INDEPENDENT_AMBULATORY_CARE_PROVIDER_SITE_OTHER)
Admission: RE | Admit: 2012-02-10 | Discharge: 2012-02-10 | Disposition: A | Discharge status: Home / Self Care | Payer: Medicare Other | Source: Ambulatory Visit | Attending: Internal Medicine | Admitting: Internal Medicine

## 2012-02-10 ENCOUNTER — Inpatient Hospital Stay: Admission: RE | Admit: 2012-02-10 | Payer: Medicare Other | Source: Ambulatory Visit

## 2012-02-10 DIAGNOSIS — N949 Unspecified condition associated with female genital organs and menstrual cycle: Secondary | ICD-10-CM

## 2012-02-10 DIAGNOSIS — M549 Dorsalgia, unspecified: Secondary | ICD-10-CM

## 2012-02-10 MED ORDER — IOHEXOL 300 MG/ML  SOLN
100.0000 mL | Freq: Once | INTRAMUSCULAR | Status: AC | PRN
  Administered 2012-02-10: 100 mL via INTRAVENOUS

## 2012-02-12 ENCOUNTER — Other Ambulatory Visit: Payer: Self-pay | Admitting: Internal Medicine

## 2012-02-12 ENCOUNTER — Telehealth: Payer: Self-pay

## 2012-02-12 DIAGNOSIS — R350 Frequency of micturition: Secondary | ICD-10-CM

## 2012-02-12 DIAGNOSIS — R102 Pelvic and perineal pain: Secondary | ICD-10-CM

## 2012-02-12 NOTE — Telephone Encounter (Signed)
Message copied by Maurice Small on Thu Feb 12, 2012  4:19 PM ------      Message from: Pecola Lawless      Created: Thu Feb 12, 2012  8:39 AM       There is no clear explanation for the suprapubic and low back pain. Most important is the absence of colitis, diverticulitis or other bowel disease. Because of the tenderness in the suprapubic area on exam and the urinary frequency; I do recommend starting with a urology evaluation. If that evaluation is negative imaging of the lumbosacral spine could be pursued. [employer]

## 2012-02-12 NOTE — Telephone Encounter (Signed)
I tried to call patient, recording stated patient's voicemail box is full (unable to leave message), will try again later.

## 2012-02-13 NOTE — Telephone Encounter (Signed)
Discuss with patient  

## 2012-02-25 ENCOUNTER — Other Ambulatory Visit: Payer: Self-pay | Admitting: Internal Medicine

## 2012-02-25 ENCOUNTER — Telehealth: Payer: Self-pay | Admitting: Internal Medicine

## 2012-02-25 DIAGNOSIS — M549 Dorsalgia, unspecified: Secondary | ICD-10-CM

## 2012-02-25 NOTE — Telephone Encounter (Signed)
Pt states she was referred by Dr. [name] to a urologist and when she went for the appt they told her its was not urology-related and that she needed to see an ortho. Pt is requesting that we refer her to [facility] to a back specialist for an appt this week. She states she is leaving to go on vacation next week and would like to have something done for her back pain before she leaves.

## 2012-02-25 NOTE — Telephone Encounter (Signed)
Dr.Hopper please advise 

## 2012-02-29 NOTE — Telephone Encounter (Signed)
Ok to refer to ortho for back pain

## 2012-03-01 NOTE — Telephone Encounter (Signed)
Order placed

## 2012-03-17 ENCOUNTER — Other Ambulatory Visit: Payer: Self-pay | Admitting: Specialist

## 2012-03-17 DIAGNOSIS — M545 Low back pain: Secondary | ICD-10-CM

## 2012-03-17 DIAGNOSIS — M549 Dorsalgia, unspecified: Secondary | ICD-10-CM

## 2012-03-18 ENCOUNTER — Other Ambulatory Visit: Payer: Medicare Other

## 2012-03-18 ENCOUNTER — Ambulatory Visit
Admission: RE | Admit: 2012-03-18 | Discharge: 2012-03-18 | Disposition: A | Discharge status: Home / Self Care | Payer: Medicare Other | Source: Ambulatory Visit | Attending: Specialist | Admitting: Specialist

## 2012-03-18 DIAGNOSIS — M549 Dorsalgia, unspecified: Secondary | ICD-10-CM

## 2012-03-18 DIAGNOSIS — M545 Low back pain: Secondary | ICD-10-CM

## 2012-03-19 ENCOUNTER — Other Ambulatory Visit: Payer: Self-pay | Admitting: Cardiovascular Disease

## 2012-03-19 NOTE — Telephone Encounter (Signed)
[facility]

## 2012-03-20 MED ORDER — METOPROLOL SUCCINATE ER 25 MG PO TB24: 25.0000 mg | ORAL_TABLET | Freq: Every day | ORAL | Status: DC

## 2012-03-21 ENCOUNTER — Emergency Department (HOSPITAL_COMMUNITY)
Admission: EM | Admit: 2012-03-21 | Discharge: 2012-03-21 | Disposition: A | Discharge status: Home / Self Care | Payer: Medicare Other | Attending: Emergency Medicine | Admitting: Emergency Medicine

## 2012-03-21 ENCOUNTER — Encounter (HOSPITAL_COMMUNITY): Payer: Self-pay | Admitting: Emergency Medicine

## 2012-03-21 ENCOUNTER — Emergency Department (HOSPITAL_COMMUNITY): Payer: Medicare Other

## 2012-03-21 DIAGNOSIS — Z79899 Other long term (current) drug therapy: Secondary | ICD-10-CM | POA: Insufficient documentation

## 2012-03-21 DIAGNOSIS — M549 Dorsalgia, unspecified: Secondary | ICD-10-CM | POA: Insufficient documentation

## 2012-03-21 DIAGNOSIS — I2510 Atherosclerotic heart disease of native coronary artery without angina pectoris: Secondary | ICD-10-CM | POA: Insufficient documentation

## 2012-03-21 DIAGNOSIS — I10 Essential (primary) hypertension: Secondary | ICD-10-CM | POA: Insufficient documentation

## 2012-03-21 DIAGNOSIS — R112 Nausea with vomiting, unspecified: Secondary | ICD-10-CM | POA: Insufficient documentation

## 2012-03-21 DIAGNOSIS — I252 Old myocardial infarction: Secondary | ICD-10-CM | POA: Insufficient documentation

## 2012-03-21 DIAGNOSIS — R10817 Generalized abdominal tenderness: Secondary | ICD-10-CM | POA: Insufficient documentation

## 2012-03-21 DIAGNOSIS — J449 Chronic obstructive pulmonary disease, unspecified: Secondary | ICD-10-CM | POA: Insufficient documentation

## 2012-03-21 DIAGNOSIS — J4489 Other specified chronic obstructive pulmonary disease: Secondary | ICD-10-CM | POA: Insufficient documentation

## 2012-03-21 DIAGNOSIS — R109 Unspecified abdominal pain: Secondary | ICD-10-CM | POA: Insufficient documentation

## 2012-03-21 LAB — URINALYSIS, ROUTINE W REFLEX MICROSCOPIC
Glucose, UA: NEGATIVE mg/dL
Hgb urine dipstick: NEGATIVE
Ketones, ur: NEGATIVE mg/dL
Protein, ur: NEGATIVE mg/dL
Urobilinogen, UA: 1 mg/dL (ref 0.0–1.0)

## 2012-03-21 LAB — COMPREHENSIVE METABOLIC PANEL
AST: 14 U/L (ref 0–37)
CO2: 24 mEq/L (ref 19–32)
Chloride: 105 mEq/L (ref 96–112)
Creatinine, Ser: 1 mg/dL (ref 0.50–1.10)
GFR calc Af Amer: 64 mL/min — ABNORMAL LOW (ref >90)
GFR calc non Af Amer: 55 mL/min — ABNORMAL LOW (ref >90)
Glucose, Bld: 109 mg/dL — ABNORMAL HIGH (ref 70–99)
Total Bilirubin: 0.3 mg/dL (ref 0.3–1.2)

## 2012-03-21 LAB — POCT I-STAT TROPONIN I: Troponin i, poc: 0 ng/mL (ref 0.00–0.08)

## 2012-03-21 LAB — LACTIC ACID, PLASMA: Lactic Acid, Venous: 0.8 mmol/L (ref 0.5–2.2)

## 2012-03-21 LAB — DIFFERENTIAL
Eosinophils Absolute: 0.3 10*3/uL (ref 0.0–0.7)
Eosinophils Relative: 3 % (ref 0–5)
Lymphocytes Relative: 20 % (ref 12–46)
Lymphs Abs: 1.8 10*3/uL (ref 0.7–4.0)
Monocytes Absolute: 0.6 10*3/uL (ref 0.1–1.0)
Monocytes Relative: 7 % (ref 3–12)

## 2012-03-21 LAB — CBC
HCT: 40.4 % (ref 36.0–46.0)
Hemoglobin: 13.9 g/dL (ref 12.0–15.0)
MCH: 30.3 pg (ref 26.0–34.0)
MCV: 88 fL (ref 78.0–100.0)
Platelets: 207 10*3/uL (ref 150–400)
RBC: 4.59 MIL/uL (ref 3.87–5.11)
WBC: 8.9 10*3/uL (ref 4.0–10.5)

## 2012-03-21 MED ORDER — ONDANSETRON HCL 4 MG/2ML IJ SOLN
4.0000 mg | Freq: Once | INTRAMUSCULAR | Status: AC
  Administered 2012-03-21: 4 mg via INTRAVENOUS
  Filled 2012-03-21: qty 2

## 2012-03-21 MED ORDER — MORPHINE SULFATE 4 MG/ML IJ SOLN
INTRAMUSCULAR | Status: AC
  Filled 2012-03-21: qty 1

## 2012-03-21 MED ORDER — MORPHINE SULFATE 4 MG/ML IJ SOLN
4.0000 mg | Freq: Once | INTRAMUSCULAR | Status: AC
  Administered 2012-03-21: 4 mg via INTRAVENOUS
  Filled 2012-03-21: qty 1

## 2012-03-21 MED ORDER — MORPHINE SULFATE 4 MG/ML IJ SOLN
4.0000 mg | Freq: Once | INTRAMUSCULAR | Status: AC
  Administered 2012-03-21: 4 mg via INTRAVENOUS

## 2012-03-21 MED ORDER — LORAZEPAM 2 MG/ML IJ SOLN
1.0000 mg | Freq: Once | INTRAMUSCULAR | Status: AC
  Administered 2012-03-21: 1 mg via INTRAVENOUS
  Filled 2012-03-21: qty 1

## 2012-03-21 MED ORDER — IOHEXOL 300 MG/ML  SOLN
100.0000 mL | Freq: Once | INTRAMUSCULAR | Status: AC | PRN
  Administered 2012-03-21: 100 mL via INTRAVENOUS

## 2012-03-21 MED ORDER — SODIUM CHLORIDE 0.9 % IV SOLN
Freq: Once | INTRAVENOUS | Status: AC
  Administered 2012-03-21: 17:00:00 via INTRAVENOUS

## 2012-03-21 NOTE — ED Notes (Signed)
[id number]<AV> Expected date:<BR> Expected time:<BR> Means of arrival:<BR> Comments:<BR> Hold for charge

## 2012-03-21 NOTE — Discharge Instructions (Signed)
Follow up with dr. [name] next week

## 2012-03-21 NOTE — ED Notes (Addendum)
Pt states that March 27th she began having abdominal pain.  Went to an urgent care where she was dx incorrectly w/ kidney/bladder infection.  Pain only got worse.  Thought it was her diverticulitis.  Cipro/Flagyl called in with no relief.  Saw Dr. [name] and had CT scan of abdomen which was unremarkable.  Was seen by Dr. Brunilda Payor.  Exam was unremarkable.  Followed up w/ orthopedist Dr. Jillyn Hidden. MRI preformed last week on back was unremarkable.  Dr. Jarold Motto, GI said that he did not think her pain had anything to do w/ GI.  Scheduled appt w/ Dr. [name] in early June.  Pt presents today w/ chest pain (nitro taken w/ relief), upper abdominal pain that radiates to back.  Last pain pill (2 percocet) was taken around 1230 today.  Mid abdomen tender upon palpation.  Feels a sense of fullness in upper abdomen and radiates to mid/lower back.

## 2012-03-21 NOTE — ED Provider Notes (Signed)
History     CSN: [id number]  Arrival date & time 03/21/12  1624   First MD Initiated Contact with Patient 03/21/12 1625      Chief Complaint  Patient presents with  . Abdominal Pain  . Back Pain  . Nausea  . Emesis    (Consider location/radiation/quality/duration/timing/severity/associated sxs/prior treatment) Patient is a 71 y.o. female presenting with abdominal pain, back pain, and vomiting. The history is provided by the patient.  Abdominal Pain The primary symptoms of the illness include abdominal pain and vomiting.  Additional symptoms associated with the illness include back pain.  Back Pain  Associated symptoms include abdominal pain.  Emesis  Associated symptoms include abdominal pain.   patient presents with abdominal pain that has been chronic since March 27 of this year. Has had multiple evaluations by multiple different specialists without a diagnosis. She has had an abdominal CT a month ago that was negative. She's also been seen by her gastroenterologist and he  was unable to make a diagnosis. She was having lower back pain which has been evaluated by an MRI recently which was negative. She's been seen by a urologist for that the symptoms as well and that workup has been negative. She's been taking Percocet with temporary relief of her symptoms. Symptoms are described as being upper and lower abdominal pain without vomiting or diarrhea or fever. Patient also today had some worsening of her upper epigastric pain for which she took nitroglycerin which did not have any effect on her symptoms. Denies any change of bowel or bladder function. No urinary symptoms currently.  Past Medical History  Diagnosis Date  . CAD (coronary artery disease)   . CVA (cerebral vascular accident)   . Myocardial infarct   . Other emphysema   . Unspecified arthropathy, shoulder region   . Gastric ulcer, unspecified as acute or chronic, without mention of hemorrhage, perforation, or obstruction    . Benign paroxysmal positional vertigo   . COPD (chronic obstructive pulmonary disease)   . Hypertension   . Diverticula of colon   . Osteopenia   . Personal history of colonic polyps 2007    tubular adenoma high grade dysplasia  . Diverticulitis   . Hyperlipemia   . Fracture of ankle, bimalleolar, right, closed   . Methylprednisolone-induced hypoxia   . Tubular adenoma   . Benign positional vertigo   . Arthritis     left shoulder  . C. difficile diarrhea     Past Surgical History  Procedure Date  . Total abdominal hysterectomy w/ bilateral salpingoophorectomy   . Angioplasty     x 3  . Total shoulder arthroplasty 2012    left  . Orif ankle fracture 2007    right  . Breast enhancement surgery     bilateral  . Martial megeti     bladder  . Varicose vein surgery   . Ptca   . Coronary angioplasty with stent placement 2010  . Laparoscopically assisted sigmoid colectomy 07/07/2011    Dr [name]  . Total shoulder replacement 12/05/2010    Dr Rennis Chris    Family History  Problem Relation Age of Onset  . Breast cancer Sister     x 2  . Colon cancer Neg Hx   . Diabetes Paternal Grandmother   . Uterine cancer Paternal Grandmother   . Heart disease Mother   . Heart disease Father   . Pancreatic cancer Cousin   . Colon polyps Sister   . Diabetes Maternal Uncle   .  Heart disease Maternal Grandfather   . Heart disease Paternal Grandfather   . Stroke Paternal Grandfather   . Lung cancer Paternal Grandmother     History  Substance Use Topics  . Smoking status: Former Smoker    Quit date: 10/20/1997  . Smokeless tobacco: Never Used  . Alcohol Use: Yes     occasionally    OB History    Grav Para Term Preterm Abortions TAB SAB Ect Mult Living                  Review of Systems  Gastrointestinal: Positive for vomiting and abdominal pain.  Musculoskeletal: Positive for back pain.  All other systems reviewed and are negative.    Allergies  Escitalopram oxalate;  Cephalexin; Codeine; and Sulfonamide derivatives  Home Medications   Current Outpatient Rx  Name Route Sig Dispense Refill  . ALBUTEROL SULFATE HFA 108 (90 BASE) MCG/ACT IN AERS Inhalation Inhale 2 puffs into the lungs every 6 (six) hours as needed for wheezing. 3 Inhaler 3  . ALENDRONATE SODIUM 70 MG PO TABS Oral Take 70 mg by mouth every 7 (seven) days. Wednesday Take with a full glass of water on an empty stomach.    . ASPIRIN 81 MG PO TABS Oral Take 81 mg by mouth daily.      Marland Kitchen CALCIUM CARBONATE-VITAMIN D 500-200 MG-UNIT PO TABS Oral Take 1 tablet by mouth daily.      Marland Kitchen CASANTHRANOL-DOCUSATE SODIUM 30-100 MG PO CAPS Oral Take 1 tablet by mouth daily. For constipation    . ALLEREST MAXIMUM STRENGTH PO Oral Take 1 tablet by mouth daily.      Marland Kitchen VITAMIN D3 2000 UNITS PO TABS Oral Take 1 tablet by mouth 2 (two) times daily.     Marland Kitchen CRANBERRY-OLIVE LEAF 365-50 MG PO CAPS Oral Take 1 capsule by mouth every other day.     Marland Kitchen FLUTICASONE-SALMETEROL 250-50 MCG/DOSE IN AEPB Inhalation Inhale 1 puff into the lungs every 12 (twelve) hours. 3 each 1  . GARLIC 1250 MG PO TABS Oral Take 1 tablet by mouth every other day.     Marland Kitchen HYDROCODONE-HOMATROPINE 5-1.5 MG/5ML PO SYRP  Take 2 tsp twice daily as needed 90 mL 0  . L-LYSINE 500 MG PO TABS Oral Take 1 tablet by mouth every other day.     Marland Kitchen LORATADINE 10 MG PO TABS Oral Take 10 mg by mouth 2 (two) times daily.    . MELOXICAM 15 MG PO TABS Oral Take 15 mg by mouth daily.      Marland Kitchen METOPROLOL SUCCINATE ER 25 MG PO TB24 Oral Take 1 tablet (25 mg total) by mouth at bedtime. 30 tablet 6  . MULTIVITAMINS PO CAPS Oral Take 0.5 capsules by mouth 2 (two) times daily.     Marland Kitchen FISH OIL 1200 MG PO CAPS Oral Take 1 capsule by mouth daily.     Marland Kitchen PREDNISONE (PAK) 10 MG PO TABS  As needed    . PRESCRIPTION MEDICATION Oral Take 1 capsule by mouth daily. Patient is in a stroke drug study--unsure if she takes a placebo/drug    . PROBIOTIC PO Oral Take 1 tablet by mouth daily.        Marland Kitchen SACCHAROMYCES BOULARDII 250 MG PO CAPS Oral Take 250 mg by mouth daily.      Marland Kitchen SIMVASTATIN 40 MG PO TABS Oral Take 1 tablet (40 mg total) by mouth at bedtime. 90 tablet 3  . TIOTROPIUM BROMIDE MONOHYDRATE 18 MCG IN  CAPS Inhalation Place 1 capsule (18 mcg total) into inhaler and inhale daily. 90 capsule 0  . VITAMIN B-12 1000 MCG PO TABS Oral Take 1,000 mcg by mouth daily.        BP 117/59  Pulse 83  Temp(Src) 97.5 F (36.4 C) (Oral)  Resp 18  SpO2 94%  Physical Exam  Nursing note and vitals reviewed. Constitutional: She is oriented to person, place, and time. She appears well-developed and well-nourished.  Non-toxic appearance. No distress.  HENT:  Head: Normocephalic and atraumatic.  Eyes: Conjunctivae, EOM and lids are normal. Pupils are equal, round, and reactive to light.  Neck: Normal range of motion. Neck supple. No tracheal deviation present. No mass present.  Cardiovascular: Normal rate, regular rhythm and normal heart sounds.  Exam reveals no gallop.   No murmur heard. Pulmonary/Chest: Effort normal and breath sounds normal. No stridor. No respiratory distress. She has no decreased breath sounds. She has no wheezes. She has no rhonchi. She has no rales.  Abdominal: Soft. Normal appearance and bowel sounds are normal. She exhibits no distension. There is generalized tenderness. There is no rigidity, no rebound, no guarding and no CVA tenderness.  Musculoskeletal: Normal range of motion. She exhibits no edema and no tenderness.  Neurological: She is alert and oriented to person, place, and time. She has normal strength. No cranial nerve deficit or sensory deficit. GCS eye subscore is 4. GCS verbal subscore is 5. GCS motor subscore is 6.  Skin: Skin is warm and dry. No abrasion and no rash noted.  Psychiatric: She has a normal mood and affect. Her speech is normal and behavior is normal.    ED Course  Procedures (including critical care time)   Labs Reviewed  CBC   DIFFERENTIAL  LACTIC ACID, PLASMA  COMPREHENSIVE METABOLIC PANEL  LIPASE, BLOOD  URINALYSIS, ROUTINE W REFLEX MICROSCOPIC  URINE CULTURE   No results found.   No diagnosis found.    MDM   Date: 03/21/2012  Rate: 85  Rhythm: normal sinus rhythm  QRS Axis: normal  Intervals: normal  ST/T Wave abnormalities: nonspecific T wave changes  Conduction Disutrbances:none  Narrative Interpretation:   Old EKG Reviewed: unchanged 10:13 PM Patient given pain medications and does so but at this time. Labs and x-ray results were discussed with her. She has a known history of the atherosclerosis seen and her abdominal vessels. She is encouraged to followup with her surgeon next week         Toy Baker, MD 03/21/12 2213

## 2012-03-21 NOTE — ED Notes (Signed)
Pt reminded that we need a urine sample.  

## 2012-03-21 NOTE — ED Notes (Signed)
Pt aware of the need for urine, Stated that she wants to wait until she drinks her contrast

## 2012-03-23 ENCOUNTER — Encounter: Payer: Medicare Other | Admitting: Internal Medicine

## 2012-03-24 LAB — URINE CULTURE: Colony Count: >=100000

## 2012-03-26 ENCOUNTER — Other Ambulatory Visit (INDEPENDENT_AMBULATORY_CARE_PROVIDER_SITE_OTHER): Payer: Self-pay | Admitting: General Surgery

## 2012-03-26 ENCOUNTER — Encounter (INDEPENDENT_AMBULATORY_CARE_PROVIDER_SITE_OTHER): Payer: Self-pay | Admitting: Surgery

## 2012-03-26 ENCOUNTER — Ambulatory Visit (INDEPENDENT_AMBULATORY_CARE_PROVIDER_SITE_OTHER): Payer: Medicare Other | Admitting: Surgery

## 2012-03-26 VITALS — BP 126/80 | HR 76 | Temp 98.6°F | Resp 24 | Ht 64.5 in | Wt 161.0 lb

## 2012-03-26 DIAGNOSIS — R109 Unspecified abdominal pain: Secondary | ICD-10-CM

## 2012-03-26 MED ORDER — SUCRALFATE 1 GM/10ML PO SUSP: 1.0000 g | Freq: Four times a day (QID) | ORAL | Status: DC

## 2012-03-26 MED ORDER — PANTOPRAZOLE SODIUM 20 MG PO TBEC: 20.0000 mg | DELAYED_RELEASE_TABLET | Freq: Every day | ORAL | Status: DC

## 2012-03-26 NOTE — Patient Instructions (Signed)
Followup with Dr. Loreta Ave

## 2012-03-26 NOTE — Progress Notes (Signed)
Chief Complaint:  Abdominal and pain since March 27  History of Present Illness:  [name] is an 71 y.o. female on whom I have performed a colectomy for diverticulitis who was referred by [place] Ropes for abdomen and back pain. She said she awoke on the morning of March 27 with abdominal pain that radiated into her back.    She has seen Dr. [name], called Dr. Jarold Motto and  was seen by Dr. Hardie Lora. I spoke with her by phone and agreed to see her.    She states that if she eats slowly the pain is not unbearable.  She has had CT scans and MRI that didn't reveal anything.  She requested a consultation with Dr. Loreta Ave.  I spoke with Dr. Loreta Ave and she agreed to to see.  I want to rule out a gastric ulcer and think that EGD may be in order.   Past Medical History  Diagnosis Date  . CAD (coronary artery disease)   . CVA (cerebral vascular accident)   . Myocardial infarct   . Other emphysema   . Unspecified arthropathy, shoulder region   . Gastric ulcer, unspecified as acute or chronic, without mention of hemorrhage, perforation, or obstruction   . Benign paroxysmal positional vertigo   . COPD (chronic obstructive pulmonary disease)   . Hypertension   . Diverticula of colon   . Osteopenia   . Personal history of colonic polyps 2007    tubular adenoma high grade dysplasia  . Diverticulitis   . Hyperlipemia   . Fracture of ankle, bimalleolar, right, closed   . Hypoxemia   . Tubular adenoma   . Benign positional vertigo   . Arthritis     left shoulder  . C. difficile diarrhea     Past Surgical History  Procedure Date  . Total abdominal hysterectomy w/ bilateral salpingoophorectomy   . Angioplasty     x 3  . Total shoulder arthroplasty 2012    left  . Orif ankle fracture 2007    right  . Breast enhancement surgery     bilateral  . Martial megeti     bladder  . Varicose vein surgery   . Ptca   . Coronary angioplasty with stent placement 2010  . Laparoscopically assisted sigmoid  colectomy 07/07/2011    Dr [name]  . Total shoulder replacement 12/05/2010    Dr Rennis Chris    Current Outpatient Prescriptions  Medication Sig Dispense Refill  . albuterol (PROAIR HFA) 108 (90 BASE) MCG/ACT inhaler Inhale 2 puffs into the lungs every 6 (six) hours as needed for wheezing.  3 Inhaler  3  . alendronate (FOSAMAX) 70 MG tablet Take 70 mg by mouth every 7 (seven) days. Wednesday Take with a full glass of water on an empty stomach.      Marland Kitchen aspirin 81 MG tablet Take 81 mg by mouth daily.        . calcium-vitamin D (OSCAL WITH D) 500-200 MG-UNIT per tablet Take 1 tablet by mouth daily.        [name] Sodium 30-100 MG CAPS Take 1 tablet by mouth daily. For constipation      . Chlorpheniramine-Pseudoeph (ALLEREST MAXIMUM STRENGTH PO) Take 1 tablet by mouth daily.        . Cholecalciferol (VITAMIN D3) 2000 UNITS TABS Take 1 tablet by mouth 2 (two) times daily.       [name] Leaf (URINARY TRACT HEALTH) 365-50 MG CAPS Take 1 capsule by mouth every other  day.       . darifenacin (ENABLEX) 7.5 MG 24 hr tablet Take 7.5 mg by mouth at bedtime.      . Fluticasone-Salmeterol (ADVAIR DISKUS) 250-50 MCG/DOSE AEPB Inhale 1 puff into the lungs every 12 (twelve) hours.  3 each  1  . Garlic (ODORLESS GARLIC) 1250 MG TABS Take 1 tablet by mouth every other day.       Marland Kitchen HYDROcodone-homatropine (HYCODAN) 5-1.5 MG/5ML syrup Take 2 tsp twice daily as needed  90 mL  0  . L-Lysine 500 MG TABS Take 1 tablet by mouth every other day.       . meloxicam (MOBIC) 15 MG tablet Take 15 mg by mouth daily.        . metoprolol succinate (TOPROL-XL) 25 MG 24 hr tablet Take 1 tablet (25 mg total) by mouth at bedtime.  30 tablet  6  . Multiple Vitamin (MULTIVITAMIN) capsule Take 0.5 capsules by mouth 2 (two) times daily.       . nitroGLYCERIN (NITROSTAT) 0.4 MG SL tablet Place 0.4 mg under the tongue every 5 (five) minutes as needed. Heart failure      . Omega-3 Fatty Acids (FISH OIL) 1200 MG CAPS  Take 1 capsule by mouth daily.       Marland Kitchen oxyCODONE-acetaminophen (PERCOCET) 5-325 MG per tablet Take 1 tablet by mouth every 4 (four) hours as needed. Pain      . polyethylene glycol (MIRALAX / GLYCOLAX) packet Take 17 g by mouth daily.      . simvastatin (ZOCOR) 40 MG tablet Take 1 tablet (40 mg total) by mouth at bedtime.  90 tablet  3  . tiotropium (SPIRIVA) 18 MCG inhalation capsule Place 1 capsule (18 mcg total) into inhaler and inhale daily.  90 capsule  0  . vitamin B-12 (CYANOCOBALAMIN) 1000 MCG tablet Take 1,000 mcg by mouth daily.         Current Facility-Administered Medications  Medication Dose Route Frequency Provider Last Rate Last Dose  . 0.9 %  sodium chloride infusion  500 mL Intravenous Continuous Mardella Layman, MD       Escitalopram oxalate; Cephalexin; Codeine; and Sulfonamide derivatives Family History  Problem Relation Age of Onset  . Breast cancer Sister     x 2  . Colon cancer Neg Hx   . Diabetes Paternal Grandmother   . Uterine cancer Paternal Grandmother   . Heart disease Mother   . Heart disease Father   . Pancreatic cancer Cousin   . Colon polyps Sister   . Diabetes Maternal Uncle   . Heart disease Maternal Grandfather   . Heart disease Paternal Grandfather   . Stroke Paternal Grandfather   . Lung cancer Paternal Grandmother    Social History:   reports that she quit smoking about 14 years ago. She has never used smokeless tobacco. She reports that she drinks alcohol. She reports that she does not use illicit drugs.   REVIEW OF SYSTEMS - PERTINENT POSITIVES ONLY: noncontributory  Physical Exam:   Blood pressure 126/80, pulse 76, temperature 98.6 F (37 C), temperature source Temporal, resp. rate 24, height 5' 4.5" (1.638 m), weight 161 lb (73.029 kg). Body mass index is 27.21 kg/(m^2).  GenOretha Milch YQ   Neurological: Alert and oriented to person, place, and time. Motor and sensory function is grossly intact  Head: Normocephalic and atraumatic.    Eyes: Conjunctivae are normal. Pupils are equal, round, and reactive to light. No scleral icterus.  Neck:  Normal range of motion. Neck supple. No tracheal deviation or thyromegaly present.  Cardiovascular:  SR without murmurs or gallops.  No carotid bruits Respiratory: Effort normal.  No respiratory distress. No chest wall tenderness. Breath sounds normal.  No wheezes, rales or rhonchi.  Abdomen:  Bloated and mid abd tenderness.  Prior panniculectomy.  No rebound or guarding.   GU: Musculoskeletal: Normal range of motion. Extremities are nontender. No cyanosis, edema or clubbing noted Lymphadenopathy: No cervical, preauricular, postauricular or axillary adenopathy is present Skin: Skin is warm and dry. No rash noted. No diaphoresis. No erythema. No pallor. Pscyh: Normal mood and affect. Behavior is normal. Judgment and thought content normal.   LABORATORY RESULTS: No results found for this or any previous visit (from the past 48 hour(s)).  RADIOLOGY RESULTS: No results found.  Problem List: Patient Active Problem List  Diagnoses  . DIARRHEA-PRESUMED INFECTIOUS  . CANDIDIASIS  . HYPERLIPIDEMIA-MIXED  . EXTERNAL OTITIS  . BENIGN POSITIONAL VERTIGO  . MYOCARDIAL INFARCTION, HX OF  . CORONARY ARTERY DISEASE  . CARDIOMYOPATHY, ISCHEMIC  . CVA  . ACUTE BRONCHITIS  . COPD  . ACUT GASTR ULCER W/PERF W/O MENTION OBSTRUCTION  . GASTRIC ULCER  . Diverticulosis of Colon (without Mention of Hemorrhage)  . DIVERTICULITIS-COLON  . CELLULITIS/ABSCESS NOS  . WOUND INFECTION  . ARTHRITIS, LEFT SHOULDER  . OSTEOPENIA  . HYPOXEMIA  . FRACTURE, ANKLE, RIGHT  . CEREBROVASCULAR ACCIDENT, HX OF  . Personal History of Colonic Polyps  . DIVERTICULITIS, HX OF  . HYPERTENSION, BENIGN  . Hypertension  . Hyperlipemia  . Iron deficiency anemia, unspecified  . Constipation, acute  . History of peptic ulcer disease  . Colitis  . Atypical back pain  . Colon cancer    Assessment & Plan: Will  try Carafate and Protonix empirically and refer to Dr. Shirlean Kelly B. [name], MD, [facility] [phone] beeper [phone]  03/26/2012 3:10 PM

## 2012-03-26 NOTE — ED Notes (Signed)
+  Urine. Chart sent to EDP office for review. Chart returned from EDP office. Prescribed Keflex 500 mg PO BID x 7 days. Prescribed by Trixie Dredge PA-C.

## 2012-03-28 NOTE — ED Notes (Signed)
Chart sent back to EDP. Patient is allergic to Cephalexin.

## 2012-03-30 ENCOUNTER — Other Ambulatory Visit: Payer: Self-pay

## 2012-03-30 MED ORDER — METOPROLOL SUCCINATE ER 25 MG PO TB24: 25.0000 mg | ORAL_TABLET | Freq: Every day | ORAL | Status: DC

## 2012-04-01 NOTE — ED Notes (Signed)
Order written by Trixie Dredge for  Macrobid 100 mg po BID x 7 days. Patient will call us back ,storming real bad at this time.

## 2012-04-02 ENCOUNTER — Ambulatory Visit (INDEPENDENT_AMBULATORY_CARE_PROVIDER_SITE_OTHER): Payer: Medicare Other | Admitting: Surgery

## 2012-04-02 ENCOUNTER — Telehealth: Payer: Self-pay | Admitting: Internal Medicine

## 2012-04-02 NOTE — Telephone Encounter (Signed)
I reviewed the medical records; she's not been seen by her cardiologist since September 2012. She has been actively followed by her pulmonologist and was last seen February 2013. I recommend she contact these specialists and request specific referral to [facility] for second opinion. They would know which cardiologist and pulmonologist are experts for her condition . Otherwise I would need to see her as [facility] specialists  require an updated, current  assessment and written note for referrals from a Primary Care physician  to review before they  schedule an appointment . Please bring  all medications & supplements to that appointment so I can complete the required document if you wish to pursue referral by me.

## 2012-04-02 NOTE — Telephone Encounter (Signed)
Spok w/pt. She states she would like referrals for both Cardiology and Pulmonary doctors at [facility]. She states she would like a second opinion on her heart and lung problems. She is unavailable 6/24-6/30.

## 2012-04-02 NOTE — Telephone Encounter (Signed)
Left message to call office

## 2012-04-02 NOTE — Telephone Encounter (Signed)
Message copied by Marshell Garfinkel on Fri Apr 02, 2012 10:55 AM ------      Message from: Pecola Lawless      Created: Tue Mar 30, 2012  4:10 PM       Noted ; that is her perogative      ----- Message -----         From: Marshell Garfinkel         Sent: 03/30/2012  11:44 AM           To: Pecola Lawless, MD            Pt called stating she has been to several specialists over the past few months and diagnosed with ulcers by Dr. [name]. She states she has had multiple labs, scans, etc. and does not feel it is necessary to come in for her physical that is scheduled for 05/13/12 with you. Please advise.

## 2012-04-02 NOTE — Telephone Encounter (Signed)
Discuss with patient who indicated that she will contact other physician in regard to referral.

## 2012-04-05 NOTE — ED Notes (Signed)
Patient will follow up at [facility].

## 2012-04-13 ENCOUNTER — Telehealth: Payer: Self-pay | Admitting: Pulmonary Disease

## 2012-04-13 MED ORDER — PREDNISONE 10 MG PO TABS: ORAL_TABLET | ORAL | Status: DC

## 2012-04-13 NOTE — Telephone Encounter (Signed)
Msg was closed by accident -- Dr. Vassie Loll, pls advise. Thank you.

## 2012-04-13 NOTE — Telephone Encounter (Signed)
Called, spoke with pt who c/o increased SOB x 1 wk.  Denies wheezing, chest tightness, chest pain, cough, or f/c/s.  Is taking proair 2 puffs q6h x 3-4 days with no relief along with maintance inhalers.  States she is going out of town tomorrow to a wedding and would like some prednisone called into [facility].  Also, has a f/u with RA on 05/04/12 but would like to be seen sooner by him if possible d/t the SOB.  Offered OV with TP or another provider as RA does not have any openings at this time prior to OV on 7/16 but pt declines stating, "I only want to see Dr. Vassie Loll."  She is aware he will be in office this evening.  Dr. Vassie Loll, pls advise.  Thank you.  Allergies verified:  Allergies  Allergen Reactions  . Escitalopram Oxalate Shortness Of Breath, Diarrhea and Nausea And Vomiting    Hot flashes, dizziness  . Cephalexin Other (See Comments)    Complicated by C dif colitis  . Codeine Nausea Only  . Sulfonamide Derivatives Other (See Comments)    Drug induced hepatitis

## 2012-04-13 NOTE — Addendum Note (Signed)
Addended by: Tommie Sams on: 04/13/2012 05:27 PM   Modules accepted: Orders

## 2012-04-13 NOTE — Telephone Encounter (Signed)
I spoke with pt and is aware of RA recs. I have sent RX to the pharmacy. Pt is scheduled to April 23, 2012.

## 2012-04-13 NOTE — Telephone Encounter (Signed)
OK for pred taper Take 4 tabs  daily with food x 4 days, then 3 tabs daily x 4 days, then 2 tabs daily x 4 days, then 1 tab daily x4 days then stop. #40 Will try to work in next week

## 2012-04-13 NOTE — Telephone Encounter (Signed)
I spoke with pt and advised her that RA has original message in his box and will call her as soon as he replies. Pt states she just wants to make sure she gets a call back today. I advised pt we will call her back today.

## 2012-04-23 ENCOUNTER — Telehealth: Payer: Self-pay | Admitting: Pulmonary Disease

## 2012-04-23 ENCOUNTER — Ambulatory Visit (INDEPENDENT_AMBULATORY_CARE_PROVIDER_SITE_OTHER): Payer: Medicare Other | Admitting: Pulmonary Disease

## 2012-04-23 ENCOUNTER — Ambulatory Visit (INDEPENDENT_AMBULATORY_CARE_PROVIDER_SITE_OTHER)
Admission: RE | Admit: 2012-04-23 | Discharge: 2012-04-23 | Disposition: A | Discharge status: Home / Self Care | Payer: Medicare Other | Source: Ambulatory Visit | Attending: Pulmonary Disease | Admitting: Pulmonary Disease

## 2012-04-23 ENCOUNTER — Encounter: Payer: Self-pay | Admitting: Pulmonary Disease

## 2012-04-23 VITALS — BP 100/70 | HR 80 | Temp 98.3°F | Ht 64.5 in | Wt 161.2 lb

## 2012-04-23 DIAGNOSIS — I2589 Other forms of chronic ischemic heart disease: Secondary | ICD-10-CM

## 2012-04-23 DIAGNOSIS — J449 Chronic obstructive pulmonary disease, unspecified: Secondary | ICD-10-CM

## 2012-04-23 MED ORDER — HYDROCODONE-HOMATROPINE 5-1.5 MG/5ML PO SYRP: ORAL_SOLUTION | ORAL | Status: DC

## 2012-04-23 NOTE — Assessment & Plan Note (Addendum)
Gold stg 3 with nocturnal O2  FEV1 1.0 L (48%) Refill on hycodan- to [facility] Second opinion referral to [facility] Alternative albuterol inhalers are VENTOLIN & PROVENTIL We discussed benefits of daliresp & pulm rehab in [facility] - she remains reluctant Wonder if she needs to revisit with cardiology - no overt signs of heart failure, no pulm htn on last echo 8/11

## 2012-04-23 NOTE — Patient Instructions (Addendum)
Refill on hycodan- to [facility] Second opinion referral to [facility] Alternative albuterol inhalers are VENTOLIN & PROVENTIL We discussed daliresp & pulm rehab CXr today

## 2012-04-23 NOTE — Progress Notes (Addendum)
Subjective:    Patient ID: [name], female    DOB: [birth year], 71 y.o.   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  She smoked for 40 years and quit in 1999. Advair worked better than symbicort.  Desaturates to 90% on ambulation 2/11  November , 2010 -Acute visit for 'bronchitis >> improved with avelox & low dose steroids.  Sick visit 6/11 chest cold - cefdinir helped ! (avelox too expensive) Completed course of steroids.  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  Echo 8/11 EF 45%, apical hypokinesis, nml RVSP Ct angio dec'11 neg for PE  09/22/2011  Underwent colectomy for diverticulitis [facility]) Has recovered well- had post op rapid HR.  Does not want to enroll in rehab. Has lost 5-10 lbs    04/23/2012 5 m FU Pt states her breathing has been terrible, cough w/ occasional phlem, some wheezing and chest tx. pt states she feels she has to gasp for air.  6/25 pred taper - she had to travel to [place] & could not come in, prednisone started working after a few days & she is somewhat better now Saw dr Loreta Ave for PUD - on protonix, pain better Sister , who also has COPD, is after her to get a second opinion on her condition Breathing ok, no cough, wheeze, compliant with noct O2  Remains reluctant to enroll in pulm rehab CXR showed stable changes of emphysema Pro-air too expensive, she asks for alternatives  Past Medical History  Diagnosis Date  . CAD (coronary artery disease)   . CVA (cerebral vascular accident)   . Myocardial infarct   . Other emphysema   . Unspecified arthropathy, shoulder region   . Gastric ulcer,  unspecified as acute or chronic, without mention of hemorrhage, perforation, or obstruction   . Benign paroxysmal positional vertigo   . COPD (chronic obstructive pulmonary disease)   . Hypertension   . Diverticula of colon   . Osteopenia   . Personal history of colonic polyps 2007    tubular adenoma high grade dysplasia  . Diverticulitis   . Hyperlipemia   . Fracture of ankle, bimalleolar, right, closed   . Hypoxemia   . Tubular adenoma   . Benign positional vertigo   . Arthritis     left shoulder  . C. difficile diarrhea    Past Surgical History  Procedure Date  . Total abdominal hysterectomy w/ bilateral salpingoophorectomy   . Angioplasty     x 3  . Total shoulder arthroplasty 2012    left  . Orif ankle fracture 2007    right  . Breast enhancement surgery     bilateral  . Martial megeti     bladder  . Varicose vein surgery   . Ptca   . Coronary angioplasty with stent placement 2010  . Laparoscopically assisted sigmoid colectomy 07/07/2011    Dr [name]  . Total shoulder replacement 12/05/2010    Dr Rennis Chris     Review of Systems Patient denies significant dyspnea,cough, hemoptysis,  chest pain, palpitations, pedal edema, orthopnea, paroxysmal nocturnal dyspnea, lightheadedness, nausea, vomiting, abdominal  or  leg pains      Objective:   Physical Exam  Gen. Pleasant, well-nourished, in no distress ENT - no lesions, no post nasal drip Neck: No JVD, no thyromegaly, no carotid bruits Lungs: no use of accessory muscles, no dullness to percussion, clear without rales or rhonchi  Cardiovascular: Rhythm regular, heart sounds  normal, no murmurs or gallops, no peripheral edema Musculoskeletal: No deformities, no cyanosis or clubbing         Assessment & Plan:

## 2012-04-23 NOTE — Telephone Encounter (Signed)
Called and spoke with pt and she stated that she will call back on Monday morning with the name of the meds that she needs refilled for her neb meds.  i did not see any on her med list or in the last ov note. Pt was not at home and did not have her meds with her and could not remember the name of the meds.  Will call back on Monday with this info.

## 2012-04-26 NOTE — Assessment & Plan Note (Signed)
Advised her to FU with cards again

## 2012-04-27 ENCOUNTER — Encounter: Payer: Self-pay | Admitting: Cardiovascular Disease

## 2012-04-27 ENCOUNTER — Ambulatory Visit (INDEPENDENT_AMBULATORY_CARE_PROVIDER_SITE_OTHER): Payer: Medicare Other | Admitting: Cardiovascular Disease

## 2012-04-27 VITALS — BP 100/54 | HR 73 | Ht 64.0 in | Wt 161.0 lb

## 2012-04-27 DIAGNOSIS — I2510 Atherosclerotic heart disease of native coronary artery without angina pectoris: Secondary | ICD-10-CM

## 2012-04-27 NOTE — Assessment & Plan Note (Addendum)
She has not been seen in our office over the last ten months. She is telling me today that she has had an episode of severe chest pain in May 2013 but none since. She does report worsened fatigue with dyspnea with minimal exertion. She is asking about getting a second opinion in regards to her cardiac status. I have explained to her today that she has not really gotten an opinion from me over the last ten months. We have not been informed by the patient of any problems over the last ten months. I have explained that I would be glad to have records sent to [facility] if she wishes to go there. She would like to give [place] a chance to address her complaints since she has not notified us of anything until today. She has been followed in this office for over 20 years. Will arrange Lexiscan myoview to exclude ischemia. She says that she cannot walk on a treadmill so we cannot plan an exercise test. Echo in September 2012 with LVEF of 40-45% which is not changed from before 2009. If her stress test is abnormal, will plan a cardiac cath. Her echo does not need to be repeated.

## 2012-04-27 NOTE — Progress Notes (Signed)
History of Present Illness: 71 yo female with history of CAD s/p previous stenting procedures, CVA, ischemic cardiomyopathy, COPD who is here today for cardiac follow up. She has been followed in the past by Dr. Juanda Chance and was seen in December 2011 for surgical risk assessment by Dr. Juanda Chance. I met her in February 2012 and she was doing well. Her cardiac history dates back to 74. She initially had an anterior MI in 1990 treated with TPA followed by PTCA. In July 2010 she had a drug eluting stent to the circumflex artery for unstable angina. Last cath per Dr. Juanda Chance in February 2011 with stable LAD disease, patent Circumflex stent and 50-70% mid RCA stent (see report below).  Her ejection fraction was 45% and a pulmonary wedge pressure was normal. Her symptoms were felt to be secondary to her COPD. She has had a previous stroke treated with TPA in 2009. She also has severe obstructive pulmonary disease with an FEV1 of less than 1 L. She is followed by Dr. Vassie Loll with Pulmonary.  Her Plavix has been stopped.  She underwent sigmoid colectomy in September 2012 per Dr. [name]. She has had issues with abdominal and back pain. She has been treated for possible "ulcers" with PPI and her pain resolved. She has plans for an upper endoscopy on 06/09/12 per Dr. Loreta Ave.   She is here today for cardiac follow up.  She has not been seen in our office since September 2012 when she was here for pre-operative risk assessment and was having no cardiac issues.  She tells me that she had some chest pains while at the beach in May. This was described as several hours of chest pain but she thought it may be related to her potential gastric ulcers. She also describes feeling very tired lately and she is easily dyspneic with minimal exertion.  No palpitations, near syncope, syncope.     Primary Care Physician: [name]  Last Lipid Profile:  Past Medical History  Diagnosis Date  . CAD (coronary artery disease)   . CVA  (cerebral vascular accident)   . Myocardial infarct   . Other emphysema   . Unspecified arthropathy, shoulder region   . Gastric ulcer, unspecified as acute or chronic, without mention of hemorrhage, perforation, or obstruction   . Benign paroxysmal positional vertigo   . COPD (chronic obstructive pulmonary disease)   . Hypertension   . Diverticula of colon   . Osteopenia   . Personal history of colonic polyps 2007    tubular adenoma high grade dysplasia  . Diverticulitis   . Hyperlipemia   . Fracture of ankle, bimalleolar, right, closed   . Hypoxemia   . Tubular adenoma   . Benign positional vertigo   . Arthritis     left shoulder  . C. difficile diarrhea     Past Surgical History  Procedure Date  . Total abdominal hysterectomy w/ bilateral salpingoophorectomy   . Angioplasty     x 3  . Total shoulder arthroplasty 2012    left  . Orif ankle fracture 2007    right  . Breast enhancement surgery     bilateral  . Martial megeti     bladder  . Varicose vein surgery   . Ptca   . Coronary angioplasty with stent placement 2010  . Laparoscopically assisted sigmoid colectomy 07/07/2011    Dr [name]  . Total shoulder replacement 12/05/2010    Dr Rennis Chris    Current Outpatient Prescriptions  Medication  Sig Dispense Refill  . albuterol (PROAIR HFA) 108 (90 BASE) MCG/ACT inhaler Inhale 2 puffs into the lungs every 6 (six) hours as needed for wheezing.  3 Inhaler  3  . alendronate (FOSAMAX) 70 MG tablet Take 70 mg by mouth every 7 (seven) days. Wednesday Take with a full glass of water on an empty stomach.      Marland Kitchen aspirin 81 MG tablet Take 81 mg by mouth daily.        . calcium-vitamin D (OSCAL WITH D) 500-200 MG-UNIT per tablet Take 1 tablet by mouth daily.        [name] Sodium 30-100 MG CAPS Take 1 tablet by mouth daily. For constipation      . Chlorpheniramine-Pseudoeph (ALLEREST MAXIMUM STRENGTH PO) Take 1 tablet by mouth daily.        [name] Leaf  (URINARY TRACT HEALTH) 365-50 MG CAPS Take 1 capsule by mouth daily.       Marland Kitchen darifenacin (ENABLEX) 7.5 MG 24 hr tablet Take 7.5 mg by mouth at bedtime.      . Fluticasone-Salmeterol (ADVAIR DISKUS) 250-50 MCG/DOSE AEPB Inhale 1 puff into the lungs every 12 (twelve) hours.  3 each  1  . Garlic (ODORLESS GARLIC) 1250 MG TABS Take 1 tablet by mouth every other day.       Marland Kitchen HYDROcodone-homatropine (HYCODAN) 5-1.5 MG/5ML syrup Take 2 tsp twice daily as needed  90 mL  0  . L-Lysine 500 MG TABS Take 1 tablet by mouth every other day.       . metoprolol succinate (TOPROL-XL) 25 MG 24 hr tablet Take 25 mg by mouth daily as needed.      . Multiple Vitamin (MULTIVITAMIN) capsule Take 0.5 capsules by mouth 2 (two) times daily.       . nitroGLYCERIN (NITROSTAT) 0.4 MG SL tablet Place 0.4 mg under the tongue every 5 (five) minutes as needed. Heart failure      . Omega-3 Fatty Acids (FISH OIL) 1200 MG CAPS Take 1 capsule by mouth daily.       . pantoprazole (PROTONIX) 20 MG tablet Take 1 tablet (20 mg total) by mouth daily.  30 tablet  1  . polyethylene glycol (MIRALAX / GLYCOLAX) packet Take 17 g by mouth daily.      . simvastatin (ZOCOR) 40 MG tablet Take 1 tablet (40 mg total) by mouth at bedtime.  90 tablet  3  . tiotropium (SPIRIVA) 18 MCG inhalation capsule Place 1 capsule (18 mcg total) into inhaler and inhale daily.  90 capsule  0  . vitamin B-12 (CYANOCOBALAMIN) 1000 MCG tablet Take 1,000 mcg by mouth daily.        . Cholecalciferol (VITAMIN D3) 2000 UNITS TABS Take 1 tablet by mouth 2 (two) times daily.        Current Facility-Administered Medications  Medication Dose Route Frequency Provider Last Rate Last Dose  . 0.9 %  sodium chloride infusion  500 mL Intravenous Continuous Mardella Layman, MD        Allergies  Allergen Reactions  . Escitalopram Oxalate Shortness Of Breath, Diarrhea and Nausea And Vomiting    Hot flashes, dizziness  . Cephalexin Other (See Comments)    Complicated by C dif  colitis  . Codeine Nausea Only  . Sulfonamide Derivatives Other (See Comments)    Drug induced hepatitis    History   Social History  . Marital Status: Single    Spouse Name: N/A    Number  of Children: N/A  . Years of Education: N/A   Occupational History  . Realtor [employer]   Social History Main Topics  . Smoking status: Former Smoker -- 1.0 packs/day for 40 years    Types: Cigarettes    Quit date: 10/20/1997  . Smokeless tobacco: Never Used  . Alcohol Use: Yes     occasionally  . Drug Use: No  . Sexually Active: Not on file   Other Topics Concern  . Not on file   Social History Narrative  . No narrative on file    Family History  Problem Relation Age of Onset  . Breast cancer Sister     x 2  . Colon cancer Neg Hx   . Diabetes Paternal Grandmother   . Uterine cancer Paternal Grandmother   . Heart disease Mother   . Heart disease Father   . Pancreatic cancer Cousin   . Colon polyps Sister   . Diabetes Maternal Uncle   . Heart disease Maternal Grandfather   . Heart disease Paternal Grandfather   . Stroke Paternal Grandfather   . Lung cancer Paternal Grandmother     Review of Systems:  As stated in the HPI and otherwise negative.   BP 100/54  Pulse 73  Ht 5\' 4"  (1.626 m)  Wt 161 lb (73.029 kg)  BMI 27.64 kg/m2  Physical Examination: General: Well developed, well nourished, NAD HEENT: OP clear, mucus membranes moist SKIN: warm, dry. No rashes. Neuro: No focal deficits Musculoskeletal: Muscle strength 5/5 all ext Psychiatric: Mood and affect normal Neck: No JVD, no carotid bruits, no thyromegaly, no lymphadenopathy. Lungs:Clear bilaterally, no wheezes, rhonci, crackles Cardiovascular: Regular rate and rhythm. No murmurs, gallops or rubs. Abdomen:Soft. Bowel sounds present. Non-tender.  Extremities: No lower extremity edema. Pulses are 2 + in the bilateral DP/PT.  Echo: 07/16/11:   Left ventricle: The cavity size was normal. Wall thickness      was normal. Systolic function was mildly to moderately     reduced. The estimated ejection fraction was in the range     of 40% to 45%. Akinesis of the mid-distalinferior and     apical myocardium. No evidence of thrombus.   - Mitral valve: Mild regurgitation.  Cardiac Cath 12/04/09: RESULTS:  The left main coronary artery.  The left main coronary artery was free of significant disease.  The left anterior descending artery.  The left anterior descending artery gave rise to a septal perforator, small diagonal branch, two more septal perforators, and moderately large diagonal branch.  The LAD was irregular and somewhat small in caliber distally but there was no significant obstruction.  The circumflex artery.  The circumflex artery gave rise to a small marginal branch and atrial branch, a large marginal branch, and two posterolateral branches.  There was 0% stenosis at the stent, which was located in the mid circumflex artery and extended into the second marginal branch, which was the large marginal branch.  The right coronary artery.  The right coronary artery was a moderate- sized vessel that gave rise to a left ventricular branch, posterior descending branch, and posterolateral branch.  There was 40% narrowing in the proximal vessel.  There was 50-70% narrowing in the midvessel. This did not appear to have changed from last year.  The left ventriculogram.  The left ventriculogram was performed in the RAO projection that showed hypokinesis of the anterolateral wall and akinesis of the apex.  The estimated ejection fraction was 40-45%.

## 2012-04-27 NOTE — Patient Instructions (Addendum)
Your physician recommends that you schedule a follow-up appointment in:  2 weeks.   Your physician has requested that you have a lexiscan myoview. For further information please visit [email or web address]. Please follow instruction sheet, as given.    

## 2012-05-04 ENCOUNTER — Ambulatory Visit: Payer: Medicare Other | Admitting: Pulmonary Disease

## 2012-05-05 ENCOUNTER — Other Ambulatory Visit (HOSPITAL_COMMUNITY): Payer: Medicare Other

## 2012-05-12 ENCOUNTER — Ambulatory Visit: Payer: Medicare Other | Admitting: Cardiovascular Disease

## 2012-05-13 ENCOUNTER — Encounter: Payer: Medicare Other | Admitting: Internal Medicine

## 2012-07-12 ENCOUNTER — Telehealth: Payer: Self-pay | Admitting: Pulmonary Disease

## 2012-07-12 NOTE — Telephone Encounter (Signed)
Spoke with pt. She states needs letter from [facility] for medicare explaining why she needs o2.  Called [facility] at [phone] to see exactly what is needed.   Spoke with Psychologist, clinical at [employer] She states that on Oct 6th medicare will not pay for o2 any longer since it was started 36 months ago. Needs letter sent to insurance. RA, please advise, thanks!

## 2012-07-13 ENCOUNTER — Encounter: Payer: Self-pay | Admitting: *Deleted

## 2012-07-13 NOTE — Telephone Encounter (Signed)
Letter was placed in [facility] look at for signature.

## 2012-07-13 NOTE — Telephone Encounter (Signed)
OK to give such lete. Under my care for moderate COPD & has been on nocturnal oxygen since oct '10 for nocturnal desaturation. She is compliant & this is helpful

## 2012-07-16 NOTE — Telephone Encounter (Signed)
Per [facility] just fax the letter over to [place] at [facility]. I have done so.

## 2012-07-20 ENCOUNTER — Telehealth: Payer: Self-pay | Admitting: Pulmonary Disease

## 2012-07-20 DIAGNOSIS — J449 Chronic obstructive pulmonary disease, unspecified: Secondary | ICD-10-CM

## 2012-07-20 NOTE — Telephone Encounter (Signed)
Order will need to be faxed to [phone].  Antionette Fairy

## 2012-07-20 NOTE — Telephone Encounter (Signed)
Order sent to [facility] for Oxygen 2L/min at night to be sent to [facility] so pt can travel to [facility] and receive oxygen when she gets there.

## 2012-07-24 ENCOUNTER — Other Ambulatory Visit: Payer: Self-pay | Admitting: Pulmonary Disease

## 2012-07-26 ENCOUNTER — Telehealth: Payer: Self-pay | Admitting: Pulmonary Disease

## 2012-07-26 MED ORDER — FLUTICASONE-SALMETEROL 250-50 MCG/DOSE IN AEPB: 1.0000 | INHALATION_SPRAY | Freq: Two times a day (BID) | RESPIRATORY_TRACT | Status: DC

## 2012-07-26 MED ORDER — TIOTROPIUM BROMIDE MONOHYDRATE 18 MCG IN CAPS: 18.0000 ug | ORAL_CAPSULE | Freq: Every day | RESPIRATORY_TRACT | Status: DC

## 2012-07-26 NOTE — Telephone Encounter (Signed)
Pt aware that Rx refill request has been sent back and samples of each have been placed at front as patient will have to wait on mail order to come in.

## 2012-08-23 ENCOUNTER — Telehealth: Payer: Self-pay | Admitting: Cardiovascular Disease

## 2012-08-23 NOTE — Telephone Encounter (Signed)
Release was Signed From [place], All Progress Notes From 2010-2013,Echo 05/22/10 &04/25/09 12 Leadx3,LAst 2 Cath's faxed to [facility] [phone]   08/23/12/KM

## 2012-08-23 NOTE — Telephone Encounter (Signed)
Left message to call back  

## 2012-08-23 NOTE — Telephone Encounter (Signed)
New problem:  [facility] in [place] dmit to hospital -  Need medical records did not want to be transfer. No additional information was disclose - concerning her health.

## 2012-08-23 NOTE — Telephone Encounter (Signed)
Medical records has received request and will send records this AM.

## 2012-08-23 NOTE — Telephone Encounter (Signed)
Spoke with pt who states she was admitted to [facility] from 10/24-10/27 with pericarditis.  She is currently at [facility]. She was admitted August 20, 2012 with a stroke.  She states she has signed release of information and is calling to make sure we received this.  I told her I would check and call her back if we did not receive it.

## 2012-08-24 ENCOUNTER — Telehealth: Payer: Self-pay | Admitting: Cardiovascular Disease

## 2012-08-24 NOTE — Telephone Encounter (Signed)
New Problem:    Please call back, patient would like to be seen as soon as possible.  Patient is aware that you were not in the office when this message was taken.  When offered the triage nurses patient declined opting to wait until you returned.

## 2012-08-26 NOTE — Telephone Encounter (Signed)
Spoke with pt who states she was released from hospital in [place] on Tuesday.  I offered pt appt with Dr. Clifton James today at 4:30 but she did not want to come in for office visit until we received records from her recent hospitalization. She states these are being mailed to [place] from the hospital.  She would like to be contacted to schedule appt as soon as possible once records are received. She is leaving on November 17,2013 to go out of town for 2 weeks.  Pt also states she was started on Plavix and is asking if she should take aspirin with this.  She said she was told to take aspirin 81 mg daily when released from hospital.  I told her that she should follow discharge instructions from hospital. I have spoken with medical records and scheduling and they will contact me when records arrive.

## 2012-08-30 NOTE — Telephone Encounter (Signed)
Spoke with pt. Pt was hospitalized in [facility] 2 weeks ago for pericarditis. Pt was prescribed indomethacin but it had to be discontinued due to vertigo. Pt was then advised to take aspirin 325mg  daily and was prescribed plavix.

## 2012-08-30 NOTE — Telephone Encounter (Signed)
Pt asked if her records had been received from [facility] and [facility]. I offered to let pt speak with medical records so they could check on this for her. She said she would just check tomorrow.

## 2012-08-30 NOTE — Telephone Encounter (Signed)
After talking with pt further she states she was hospitalized in [place] last week with a stroke. She was prescribed plavix on discharge. She states on Saturday she developed minimal chest pain similar to pericarditis pain. She is requesting appt with Dr Clifton James this week. I have given her an appt to see Sunday Spillers 08/31/12-no openings with Dr Clifton James 08/31/12 and this is the only office day for him this week. Pt is aware the appt is with Lawson Fiscal.

## 2012-08-30 NOTE — Telephone Encounter (Signed)
New problem:  C/O mild pain from pericarditis - recently hospitalization

## 2012-08-31 ENCOUNTER — Inpatient Hospital Stay (HOSPITAL_COMMUNITY)
Admission: AD | Admit: 2012-08-31 | Discharge: 2012-09-02 | DRG: 247 | Disposition: A | Discharge status: Home / Self Care | Payer: Medicare Other | Source: Ambulatory Visit | Attending: Cardiovascular Disease | Admitting: Cardiovascular Disease

## 2012-08-31 ENCOUNTER — Other Ambulatory Visit: Payer: Self-pay | Admitting: Nurse Practitioner

## 2012-08-31 ENCOUNTER — Other Ambulatory Visit: Payer: Self-pay | Admitting: *Deleted

## 2012-08-31 ENCOUNTER — Encounter: Payer: Self-pay | Admitting: Nurse Practitioner

## 2012-08-31 ENCOUNTER — Ambulatory Visit (INDEPENDENT_AMBULATORY_CARE_PROVIDER_SITE_OTHER): Payer: Medicare Other | Admitting: Nurse Practitioner

## 2012-08-31 ENCOUNTER — Encounter (HOSPITAL_COMMUNITY): Payer: Self-pay | Admitting: General Practice

## 2012-08-31 VITALS — BP 122/72 | HR 76 | Ht 64.5 in | Wt 160.0 lb

## 2012-08-31 DIAGNOSIS — Z888 Allergy status to other drugs, medicaments and biological substances status: Secondary | ICD-10-CM

## 2012-08-31 DIAGNOSIS — K259 Gastric ulcer, unspecified as acute or chronic, without hemorrhage or perforation: Secondary | ICD-10-CM | POA: Diagnosis present

## 2012-08-31 DIAGNOSIS — Z800 Family history of malignant neoplasm of digestive organs: Secondary | ICD-10-CM

## 2012-08-31 DIAGNOSIS — R079 Chest pain, unspecified: Secondary | ICD-10-CM

## 2012-08-31 DIAGNOSIS — Z7982 Long term (current) use of aspirin: Secondary | ICD-10-CM

## 2012-08-31 DIAGNOSIS — Z87891 Personal history of nicotine dependence: Secondary | ICD-10-CM

## 2012-08-31 DIAGNOSIS — Z8049 Family history of malignant neoplasm of other genital organs: Secondary | ICD-10-CM

## 2012-08-31 DIAGNOSIS — H8110 Benign paroxysmal vertigo, unspecified ear: Secondary | ICD-10-CM | POA: Diagnosis present

## 2012-08-31 DIAGNOSIS — I10 Essential (primary) hypertension: Secondary | ICD-10-CM | POA: Diagnosis present

## 2012-08-31 DIAGNOSIS — Z803 Family history of malignant neoplasm of breast: Secondary | ICD-10-CM

## 2012-08-31 DIAGNOSIS — I200 Unstable angina: Secondary | ICD-10-CM | POA: Diagnosis present

## 2012-08-31 DIAGNOSIS — Z882 Allergy status to sulfonamides status: Secondary | ICD-10-CM

## 2012-08-31 DIAGNOSIS — Z7902 Long term (current) use of antithrombotics/antiplatelets: Secondary | ICD-10-CM

## 2012-08-31 DIAGNOSIS — Z860100 Personal history of colon polyps, unspecified: Secondary | ICD-10-CM

## 2012-08-31 DIAGNOSIS — I2510 Atherosclerotic heart disease of native coronary artery without angina pectoris: Principal | ICD-10-CM | POA: Diagnosis present

## 2012-08-31 DIAGNOSIS — Z801 Family history of malignant neoplasm of trachea, bronchus and lung: Secondary | ICD-10-CM

## 2012-08-31 DIAGNOSIS — J4489 Other specified chronic obstructive pulmonary disease: Secondary | ICD-10-CM | POA: Diagnosis present

## 2012-08-31 DIAGNOSIS — I259 Chronic ischemic heart disease, unspecified: Secondary | ICD-10-CM

## 2012-08-31 DIAGNOSIS — I252 Old myocardial infarction: Secondary | ICD-10-CM

## 2012-08-31 DIAGNOSIS — E785 Hyperlipidemia, unspecified: Secondary | ICD-10-CM | POA: Diagnosis present

## 2012-08-31 DIAGNOSIS — Z96619 Presence of unspecified artificial shoulder joint: Secondary | ICD-10-CM

## 2012-08-31 DIAGNOSIS — Z79899 Other long term (current) drug therapy: Secondary | ICD-10-CM

## 2012-08-31 DIAGNOSIS — Z9861 Coronary angioplasty status: Secondary | ICD-10-CM

## 2012-08-31 DIAGNOSIS — Z823 Family history of stroke: Secondary | ICD-10-CM

## 2012-08-31 DIAGNOSIS — I2589 Other forms of chronic ischemic heart disease: Secondary | ICD-10-CM | POA: Diagnosis present

## 2012-08-31 DIAGNOSIS — J449 Chronic obstructive pulmonary disease, unspecified: Secondary | ICD-10-CM | POA: Diagnosis present

## 2012-08-31 DIAGNOSIS — Z8249 Family history of ischemic heart disease and other diseases of the circulatory system: Secondary | ICD-10-CM

## 2012-08-31 DIAGNOSIS — M949 Disorder of cartilage, unspecified: Secondary | ICD-10-CM | POA: Diagnosis present

## 2012-08-31 DIAGNOSIS — Z8673 Personal history of transient ischemic attack (TIA), and cerebral infarction without residual deficits: Secondary | ICD-10-CM

## 2012-08-31 DIAGNOSIS — K5732 Diverticulitis of large intestine without perforation or abscess without bleeding: Secondary | ICD-10-CM | POA: Diagnosis present

## 2012-08-31 DIAGNOSIS — Z833 Family history of diabetes mellitus: Secondary | ICD-10-CM

## 2012-08-31 DIAGNOSIS — K219 Gastro-esophageal reflux disease without esophagitis: Secondary | ICD-10-CM | POA: Diagnosis present

## 2012-08-31 DIAGNOSIS — M899 Disorder of bone, unspecified: Secondary | ICD-10-CM | POA: Diagnosis present

## 2012-08-31 HISTORY — DX: Ischemic cardiomyopathy: I25.5

## 2012-08-31 HISTORY — DX: Transient cerebral ischemic attack, unspecified: G45.9

## 2012-08-31 HISTORY — DX: Gastro-esophageal reflux disease without esophagitis: K21.9

## 2012-08-31 HISTORY — DX: Dependence on supplemental oxygen: Z99.81

## 2012-08-31 HISTORY — DX: Inflammatory liver disease, unspecified: K75.9

## 2012-08-31 HISTORY — DX: Asymptomatic varicose veins of unspecified lower extremity: I83.90

## 2012-08-31 HISTORY — DX: Unspecified asthma, uncomplicated: J45.909

## 2012-08-31 LAB — CBC WITH DIFFERENTIAL/PLATELET
Basophils Absolute: 0 10*3/uL (ref 0.0–0.1)
Basophils Relative: 0 % (ref 0–1)
Eosinophils Absolute: 0.2 10*3/uL (ref 0.0–0.7)
Eosinophils Relative: 2 % (ref 0–5)
HCT: 42 % (ref 36.0–46.0)
Hemoglobin: 14.3 g/dL (ref 12.0–15.0)
Lymphocytes Relative: 24 % (ref 12–46)
Lymphs Abs: 2.1 10*3/uL (ref 0.7–4.0)
MCH: 30.2 pg (ref 26.0–34.0)
MCHC: 34 g/dL (ref 30.0–36.0)
MCV: 88.8 fL (ref 78.0–100.0)
Monocytes Absolute: 0.5 10*3/uL (ref 0.1–1.0)
Monocytes Relative: 5 % (ref 3–12)
Neutro Abs: 6.2 10*3/uL (ref 1.7–7.7)
Neutrophils Relative %: 69 % (ref 43–77)
Platelets: 199 10*3/uL (ref 150–400)
RBC: 4.73 MIL/uL (ref 3.87–5.11)
RDW: 12.5 % (ref 11.5–15.5)
WBC: 9 10*3/uL (ref 4.0–10.5)

## 2012-08-31 LAB — COMPREHENSIVE METABOLIC PANEL
ALT: 18 U/L (ref 0–35)
AST: 20 U/L (ref 0–37)
Albumin: 3.9 g/dL (ref 3.5–5.2)
Alkaline Phosphatase: 107 U/L (ref 39–117)
BUN: 23 mg/dL (ref 6–23)
CO2: 26 mEq/L (ref 19–32)
Calcium: 10.1 mg/dL (ref 8.4–10.5)
Chloride: 105 mEq/L (ref 96–112)
Creatinine, Ser: 0.85 mg/dL (ref 0.50–1.10)
GFR calc Af Amer: 78 mL/min — ABNORMAL LOW (ref >90)
GFR calc non Af Amer: 67 mL/min — ABNORMAL LOW (ref >90)
Glucose, Bld: 126 mg/dL — ABNORMAL HIGH (ref 70–99)
Potassium: 3.6 mEq/L (ref 3.5–5.1)
Sodium: 141 mEq/L (ref 135–145)
Total Bilirubin: 0.6 mg/dL (ref 0.3–1.2)
Total Protein: 6.8 g/dL (ref 6.0–8.3)

## 2012-08-31 LAB — APTT: aPTT: 37 seconds (ref 24–37)

## 2012-08-31 LAB — TROPONIN I
Troponin I: <0.3 ng/mL (ref <0.30)
Troponin I: <0.3 ng/mL (ref <0.30)

## 2012-08-31 LAB — PROTIME-INR
INR: 1 (ref 0.00–1.49)
Prothrombin Time: 13.1 seconds (ref 11.6–15.2)

## 2012-08-31 LAB — TSH: TSH: 0.452 u[IU]/mL (ref 0.350–4.500)

## 2012-08-31 LAB — MAGNESIUM: Magnesium: 2.1 mg/dL (ref 1.5–2.5)

## 2012-08-31 MED ORDER — ASPIRIN 81 MG PO CHEW
324.0000 mg | CHEWABLE_TABLET | ORAL | Status: AC
  Administered 2012-09-01: 324 mg via ORAL
  Filled 2012-08-31: qty 4

## 2012-08-31 MED ORDER — ONDANSETRON HCL 4 MG/2ML IJ SOLN: 4.0000 mg | Freq: Four times a day (QID) | INTRAMUSCULAR | Status: DC | PRN

## 2012-08-31 MED ORDER — MUPIROCIN 2 % EX OINT
1.0000 "application " | TOPICAL_OINTMENT | Freq: Two times a day (BID) | CUTANEOUS | Status: DC
  Administered 2012-08-31 – 2012-09-02 (×4): 1 via NASAL
  Filled 2012-08-31 (×2): qty 22

## 2012-08-31 MED ORDER — ASPIRIN 300 MG RE SUPP
300.0000 mg | RECTAL | Status: AC
  Filled 2012-08-31: qty 1

## 2012-08-31 MED ORDER — CHLORHEXIDINE GLUCONATE CLOTH 2 % EX PADS
6.0000 | MEDICATED_PAD | Freq: Every day | CUTANEOUS | Status: DC
  Administered 2012-09-01: 6 via TOPICAL

## 2012-08-31 MED ORDER — ENOXAPARIN SODIUM 80 MG/0.8ML ~~LOC~~ SOLN
70.0000 mg | Freq: Once | SUBCUTANEOUS | Status: AC
  Filled 2012-08-31: qty 0.8

## 2012-08-31 MED ORDER — SODIUM CHLORIDE 0.9 % IJ SOLN: 3.0000 mL | INTRAMUSCULAR | Status: DC | PRN

## 2012-08-31 MED ORDER — CLOPIDOGREL BISULFATE 75 MG PO TABS
75.0000 mg | ORAL_TABLET | Freq: Every day | ORAL | Status: DC
  Administered 2012-09-01 – 2012-09-02 (×2): 75 mg via ORAL
  Filled 2012-08-31 (×2): qty 1

## 2012-08-31 MED ORDER — SIMVASTATIN 40 MG PO TABS
40.0000 mg | ORAL_TABLET | Freq: Every day | ORAL | Status: DC
  Administered 2012-08-31 – 2012-09-01 (×2): 40 mg via ORAL
  Filled 2012-08-31 (×3): qty 1

## 2012-08-31 MED ORDER — MOMETASONE FURO-FORMOTEROL FUM 100-5 MCG/ACT IN AERO
2.0000 | INHALATION_SPRAY | Freq: Two times a day (BID) | RESPIRATORY_TRACT | Status: DC
  Administered 2012-08-31 – 2012-09-02 (×4): 2 via RESPIRATORY_TRACT
  Filled 2012-08-31: qty 8.8

## 2012-08-31 MED ORDER — SODIUM CHLORIDE 0.9 % IV SOLN
INTRAVENOUS | Status: DC
  Administered 2012-09-01: 20 mL/h via INTRAVENOUS

## 2012-08-31 MED ORDER — ASPIRIN 81 MG PO CHEW
324.0000 mg | CHEWABLE_TABLET | ORAL | Status: AC
  Administered 2012-08-31: 324 mg via ORAL
  Filled 2012-08-31: qty 4

## 2012-08-31 MED ORDER — SODIUM CHLORIDE 0.9 % IJ SOLN: 3.0000 mL | Freq: Two times a day (BID) | INTRAMUSCULAR | Status: DC

## 2012-08-31 MED ORDER — ALBUTEROL SULFATE HFA 108 (90 BASE) MCG/ACT IN AERS
2.0000 | INHALATION_SPRAY | Freq: Four times a day (QID) | RESPIRATORY_TRACT | Status: DC | PRN
  Filled 2012-08-31: qty 6.7

## 2012-08-31 MED ORDER — DARIFENACIN HYDROBROMIDE ER 7.5 MG PO TB24
7.5000 mg | ORAL_TABLET | Freq: Every day | ORAL | Status: DC
  Administered 2012-08-31 – 2012-09-01 (×2): 7.5 mg via ORAL
  Filled 2012-08-31 (×3): qty 1

## 2012-08-31 MED ORDER — ASPIRIN EC 81 MG PO TBEC
81.0000 mg | DELAYED_RELEASE_TABLET | Freq: Every day | ORAL | Status: DC
  Administered 2012-09-02: 81 mg via ORAL
  Filled 2012-08-31: qty 1

## 2012-08-31 MED ORDER — ACETAMINOPHEN 325 MG PO TABS: 650.0000 mg | ORAL_TABLET | ORAL | Status: DC | PRN

## 2012-08-31 MED ORDER — DIAZEPAM 5 MG PO TABS
5.0000 mg | ORAL_TABLET | ORAL | Status: AC
  Administered 2012-09-01: 5 mg via ORAL
  Filled 2012-08-31: qty 1

## 2012-08-31 MED ORDER — NITROGLYCERIN 0.4 MG SL SUBL: 0.4000 mg | SUBLINGUAL_TABLET | SUBLINGUAL | Status: DC | PRN

## 2012-08-31 MED ORDER — ENOXAPARIN SODIUM 80 MG/0.8ML ~~LOC~~ SOLN
70.0000 mg | Freq: Two times a day (BID) | SUBCUTANEOUS | Status: DC
  Administered 2012-08-31 – 2012-09-01 (×2): 70 mg via SUBCUTANEOUS
  Filled 2012-08-31 (×4): qty 0.8

## 2012-08-31 MED ORDER — SODIUM CHLORIDE 0.9 % IV SOLN: 250.0000 mL | INTRAVENOUS | Status: DC | PRN

## 2012-08-31 MED ORDER — ALPRAZOLAM 0.25 MG PO TABS
0.2500 mg | ORAL_TABLET | Freq: Two times a day (BID) | ORAL | Status: DC | PRN
  Administered 2012-08-31 – 2012-09-01 (×2): 0.25 mg via ORAL
  Filled 2012-08-31 (×2): qty 1

## 2012-08-31 MED ORDER — METOPROLOL SUCCINATE ER 25 MG PO TB24
25.0000 mg | ORAL_TABLET | Freq: Every day | ORAL | Status: DC
  Administered 2012-08-31 – 2012-09-02 (×3): 25 mg via ORAL
  Filled 2012-08-31 (×3): qty 1

## 2012-08-31 MED ORDER — PANTOPRAZOLE SODIUM 20 MG PO TBEC
20.0000 mg | DELAYED_RELEASE_TABLET | Freq: Every day | ORAL | Status: DC
  Administered 2012-08-31 – 2012-09-02 (×3): 20 mg via ORAL
  Filled 2012-08-31 (×3): qty 1

## 2012-08-31 MED ORDER — TIOTROPIUM BROMIDE MONOHYDRATE 18 MCG IN CAPS
18.0000 ug | ORAL_CAPSULE | Freq: Every day | RESPIRATORY_TRACT | Status: DC
  Administered 2012-09-01 – 2012-09-02 (×2): 18 ug via RESPIRATORY_TRACT
  Filled 2012-08-31: qty 5

## 2012-08-31 MED ORDER — ZOLPIDEM TARTRATE 5 MG PO TABS: 5.0000 mg | ORAL_TABLET | Freq: Every evening | ORAL | Status: DC | PRN

## 2012-08-31 NOTE — Progress Notes (Addendum)
ANTICOAGULATION CONSULT NOTE - Initial Consult  Pharmacy Consult:  Lovenox Indication:  ACS  Allergies  Allergen Reactions  . Escitalopram Oxalate Shortness Of Breath, Diarrhea and Nausea And Vomiting    Hot flashes, dizziness  . Cephalexin Other (See Comments)    Complicated by C dif colitis  . Codeine Nausea Only  . Sulfonamide Derivatives Other (See Comments)    Drug induced hepatitis    Patient Measurements: Height: 5' 4.57" (164 cm) Weight: 160 lb 0.9 oz (72.6 kg) IBW/kg (Calculated) : 56   Vital Signs: BP: 122/72 mmHg (11/12 1208) Pulse Rate: 76  (11/12 1208)  Labs: No results found for this basename: HGB:2,HCT:3,PLT:3,APTT:3,LABPROT:3,INR:3,HEPARINUNFRC:3,CREATININE:3,CKTOTAL:3,CKMB:3,TROPONINI:3 in the last 72 hours  Estimated Creatinine Clearance: 51 ml/min (by C-G formula based on Cr of 1).   Medical History: Past Medical History  Diagnosis Date  . CAD (coronary artery disease)   . CVA (cerebral vascular accident)   . Myocardial infarct   . Other emphysema   . Unspecified arthropathy, shoulder region   . Gastric ulcer, unspecified as acute or chronic, without mention of hemorrhage, perforation, or obstruction   . Benign paroxysmal positional vertigo   . COPD (chronic obstructive pulmonary disease)   . Hypertension   . Diverticula of colon   . Osteopenia   . Personal history of colonic polyps 2007    tubular adenoma high grade dysplasia  . Diverticulitis   . Hyperlipemia   . Fracture of ankle, bimalleolar, right, closed   . Hypoxemia   . Tubular adenoma   . Benign positional vertigo   . Arthritis     left shoulder  . C. difficile diarrhea        Assessment: 66 YOF admitted with chest pain with plan for cath in AM.  Pharmacy consulted to dose Lovenox.  Labs pending.   Goal of Therapy:  Anti-Xa level 0.6-1.2 units/ml 4hrs after LMWH dose given Monitor platelets by anticoagulation protocol: Yes     Plan:  - Lovenox 70mg  SQ x 1 while  awaiting labs - CBC Q72H while on Lovenox - F/U renal function to determine maintenance therapy    [name] D. Laney Potash, PharmD, BCPS Pager:  [phone] 08/31/2012, 2:42 PM

## 2012-08-31 NOTE — Patient Instructions (Signed)
We are going to send you to the hospital today

## 2012-08-31 NOTE — H&P (Signed)
[name]   08/31/2012 11:45 AM Office Visit  MRN: [id number]   Description: 71 year old female  Provider: Rosalio Macadamia, NP  Department: [facility]        Referring Provider     Pecola Lawless, MD      Diagnoses     Ischemic heart disease   - Primary    414.9      Reason for Visit     Chest Pain    Work in visit. Seen for Dr. Clifton James.          Progress Notes     Jamie Fredrickson, NP  08/31/2012  1:09 PM  Signed    [name] Date of Birth: 1941/05/27 Medical Record #[id number]   History of Present Illness: [name] is seen today for a work in visit. She is seen for Dr. Clifton James. She has a quite complex history which includes CAD with prior stenting procedures, CVA, ischemic CM, and COPD. She had an anterior MI in 1990 treated with TPA followed by PTCA. She has had a DES to the LCX for unstable angina back in July of 2010. Last cath per Dr. Juanda Chance in February of 2011 with stable LAD disease, patent LCX stent and 50 to 70% mid RCA stent with an EF of 45%.   She comes in today. She is here alone. She spent most of October at the beach. While there she had "horrible" chest pain while at the hotel. Took aspirin and NTG x 3 with no relief. She got her chair massage and did a paraffin wax but her pain continued for several hours. EMS was called. She does not know what her work up consisted of. She says she had an MRI. Said she was told this was pericarditis and given Indocin which she could not tolerate. She was discharged and spent a few more days at the beach. Traveled almost 2 weeks ago to her daughter's house near [place]. Two weekends ago, she developed left hand weakness and could not talk. Family called EMS again. She was taken to a local hospital in [place] and says she was given TPA, then transferred to an ICU in [place]. Discharged on Plavix. No deficits except a little mouth droop. Has been back home in [place] since last Thursday. Had recurrent  chest pain over the weekend. None today. EKG here in the office is markedly different from past tracing in June. Now admitted for further evaluation. She is currently painfree.  Unfortunately, we have not received any of her records.     Current Outpatient Prescriptions on File Prior to Visit   Medication  Sig  Dispense  Refill   .  albuterol (PROAIR HFA) 108 (90 BASE) MCG/ACT inhaler  Inhale 2 puffs into the lungs every 6 (six) hours as needed for wheezing.   3 Inhaler   3   .  alendronate (FOSAMAX) 70 MG tablet  Take 70 mg by mouth every 7 (seven) days. Wednesday Take with a full glass of water on an empty stomach.         Marland Kitchen  aspirin 81 MG tablet  Take 325 mg by mouth daily.          .  calcium-vitamin D (OSCAL WITH D) 500-200 MG-UNIT per tablet  Take 1 tablet by mouth daily.           [name] Sodium 30-100 MG CAPS  Take 1 tablet by mouth daily. For  constipation         .  Chlorpheniramine-Pseudoeph (ALLEREST MAXIMUM STRENGTH PO)  Take 1 tablet by mouth daily.           .  Cholecalciferol (VITAMIN D3) 2000 UNITS TABS  Take 1 tablet by mouth 2 (two) times daily.          [name] Leaf (URINARY TRACT HEALTH) 365-50 MG CAPS  Take 1 capsule by mouth daily.          Marland Kitchen  darifenacin (ENABLEX) 7.5 MG 24 hr tablet  Take 7.5 mg by mouth at bedtime.         .  Fluticasone-Salmeterol (ADVAIR DISKUS) 250-50 MCG/DOSE AEPB  Inhale 1 puff into the lungs 2 (two) times daily.   2 each   0   .  Garlic (ODORLESS GARLIC) 1250 MG TABS  Take 1 tablet by mouth every other day.          Marland Kitchen  HYDROcodone-homatropine (HYCODAN) 5-1.5 MG/5ML syrup  Take 2 tsp twice daily as needed   90 mL   0   .  L-Lysine 500 MG TABS  Take 1 tablet by mouth every other day.          .  metoprolol succinate (TOPROL-XL) 25 MG 24 hr tablet  Take 12.5 mg by mouth daily.          .  Multiple Vitamin (MULTIVITAMIN) capsule  Take 0.5 capsules by mouth 2 (two) times daily.          .  nitroGLYCERIN (NITROSTAT) 0.4 MG SL  tablet  Place 0.4 mg under the tongue every 5 (five) minutes as needed. Heart failure         .  Omega-3 Fatty Acids (FISH OIL) 1200 MG CAPS  Take 1 capsule by mouth daily.          .  pantoprazole (PROTONIX) 20 MG tablet  Take 1 tablet (20 mg total) by mouth daily.   30 tablet   1   .  polyethylene glycol (MIRALAX / GLYCOLAX) packet  Take 17 g by mouth daily.         .  simvastatin (ZOCOR) 40 MG tablet  Take 1 tablet (40 mg total) by mouth at bedtime.   90 tablet   3   .  tiotropium (SPIRIVA HANDIHALER) 18 MCG inhalation capsule  Place 1 capsule (18 mcg total) into inhaler and inhale daily.   20 capsule   0   .  vitamin B-12 (CYANOCOBALAMIN) 1000 MCG tablet  Take 1,000 mcg by mouth daily.               Current Facility-Administered Medications on File Prior to Visit   Medication  Dose  Route  Frequency  Provider  Last Rate  Last Dose   .  0.9 %  sodium chloride infusion   500 mL  Intravenous  Continuous  Mardella Layman, MD             Allergies   Allergen  Reactions   .  Escitalopram Oxalate  Shortness Of Breath, Diarrhea and Nausea And Vomiting       Hot flashes, dizziness   .  Cephalexin  Other (See Comments)       Complicated by C dif colitis   .  Codeine  Nausea Only   .  Sulfonamide Derivatives  Other (See Comments)       Drug induced hepatitis  Past Medical History   Diagnosis  Date   .  CAD (coronary artery disease)     .  CVA (cerebral vascular accident)     .  Myocardial infarct     .  Other emphysema     .  Unspecified arthropathy, shoulder region     .  Gastric ulcer, unspecified as acute or chronic, without mention of hemorrhage, perforation, or obstruction     .  Benign paroxysmal positional vertigo     .  COPD (chronic obstructive pulmonary disease)     .  Hypertension     .  Diverticula of colon     .  Osteopenia     .  Personal history of colonic polyps  2007       tubular adenoma high grade dysplasia   .  Diverticulitis     .  Hyperlipemia     .   Fracture of ankle, bimalleolar, right, closed     .  Hypoxemia     .  Tubular adenoma     .  Benign positional vertigo     .  Arthritis         left shoulder   .  C. difficile diarrhea         Past Surgical History   Procedure  Date   .  Total abdominal hysterectomy w/ bilateral salpingoophorectomy     .  Angioplasty         x 3   .  Total shoulder arthroplasty  2012       left   .  Orif ankle fracture  2007       right   .  Breast enhancement surgery         bilateral   .  Martial megeti         bladder   .  Varicose vein surgery     .  Ptca     .  Coronary angioplasty with stent placement  2010   .  Laparoscopically assisted sigmoid colectomy  07/07/2011       Dr [name]   .  Total shoulder replacement  12/05/2010       Dr Rennis Chris       History   Smoking status   .  Former Smoker -- 1.0 packs/day for 40 years   .  Types:  Cigarettes   .  Quit date:  10/20/1997   Smokeless tobacco   .  Never Used       History   Alcohol Use   .  Yes       Comment: occasionally       Family History   Problem  Relation  Age of Onset   .  Breast cancer  Sister         x 2   .  Colon cancer  Neg Hx     .  Diabetes  Paternal Grandmother     .  Uterine cancer  Paternal Grandmother     .  Lung cancer  Paternal Grandmother     .  Heart disease  Mother     .  Heart disease  Father     .  Pancreatic cancer  Cousin     .  Colon polyps  Sister     .  Diabetes  Maternal Uncle     .  Heart disease  Maternal Grandfather     .  Heart disease  Paternal  Grandfather     .  Stroke  Paternal Grandfather        Review of Systems: The review of systems is per the HPI. She has chronic shortness of breath. No passing out. Not dizzy. Currently taking aspirin full strength and Plavix. Has bruising.  All other systems were reviewed and are negative.   Physical Exam: BP 122/72  Pulse 76  Ht 5' 4.5" (1.638 m)  Wt 160 lb (72.576 kg)  BMI 27.04 kg/m2 Patient is very pleasant and in no acute  distress. Skin is warm and dry. Color is normal.  HEENT is unremarkable. Normocephalic/atraumatic. PERRL. Sclera are nonicteric. Neck is supple. No masses. No JVD. Lungs are clear. Cardiac exam shows a regular rate and rhythm. Abdomen is soft. Extremities are without edema. Gait and ROM are intact. No gross neurologic deficits noted.     LABORATORY DATA: EKG shows sinus rhythm. She has diffuse T wave changes suggestive of ischemia. Tracing is reviewed with Dr. Clifton James.       Assessment / Plan:  Chest pain - abnormal EKG. Her symptoms are quite worrisome. She is subsequently seen with Dr. Clifton James. We have recommended admission and plan to repeat her cardiac cath. She is quite adamant about driving herself over and valet parking. We will stay try to obtain her records. Probably needs echo as well. Will continue Plavix/aspirin. Patient is agreeable to this plan and is sent on to [facility].            [place], MD  08/31/2012  1:55 PM  Signed I have seen Ms. Eckford today along with [name] and agree with the plan for admission overnight with chest pain and EKG changes. Plan cath tomorrow. Pt in agreement.    Joash Tony 1:55 PM 08/31/2012        Electronic signature on 08/31/2012          Vitals - Last Recorded       BP Pulse Ht Wt BMI      122/72 76 5' 4.5" (1.638 m) 160 lb (72.576 kg) 27.04 kg/m2         Vitals History Recorded          Pending        Disp Refills Start End    aspirin chewable tablet 324 mg     08/31/2012 08/31/2012    Route:  Oral    Class:  Normal    aspirin suppository 300 mg     08/31/2012 08/31/2012    Route:  Rectal    Class:  Normal    aspirin EC tablet 81 mg     09/01/2012      Route:  Oral    Class:  Normal    nitroGLYCERIN (NITROSTAT) SL tablet 0.4 mg     08/31/2012      Route:  Sublingual    Class:  Normal    acetaminophen (TYLENOL) tablet 650 mg     08/31/2012      Route:  Oral    Class:   Normal    ondansetron (ZOFRAN) 4 mg in sodium chloride 0.9 % 50 mL IVPB     08/31/2012      Route:  Intravenous    Class:  Normal    sodium chloride 0.9 % injection 3 mL     08/31/2012      Route:  Intravenous    Class:  Normal    sodium chloride 0.9 % injection 3 mL  08/31/2012      Route:  Intravenous    Class:  Normal    0.9 %  sodium chloride infusion     08/31/2012      Route:  Intravenous    Class:  Normal    aspirin chewable tablet 324 mg     09/01/2012 09/02/2012    Route:  Oral    Class:  Normal    sodium chloride 0.9 % injection 3 mL     08/31/2012      Route:  Intravenous    Class:  Normal    sodium chloride 0.9 % injection 3 mL     08/31/2012      Route:  Intravenous    Class:  Normal    0.9 %  sodium chloride infusion     08/31/2012      Route:  Intravenous    Class:  Normal    clopidogrel (PLAVIX) tablet 75 mg     09/01/2012      Route:  Oral    Class:  Normal    metoprolol succinate (TOPROL-XL) 24 hr tablet 25 mg     08/31/2012      Route:  Oral    Class:  Normal    simvastatin (ZOCOR) tablet 40 mg     08/31/2012      Route:  Oral    Class:  Normal    ALPRAZolam (XANAX) tablet 0.25 mg     08/31/2012      Route:  Oral    Class:  Normal    zolpidem (AMBIEN) tablet 5 mg     08/31/2012      Route:  Oral    Class:  Normal    0.9 %  sodium chloride infusion     09/01/2012      Route:  Intravenous    Class:  Normal    diazepam (VALIUM) tablet 5 mg     08/31/2012 08/31/2012    Route:  Oral    Class:  Normal    darifenacin (ENABLEX) 24 hr tablet 7.5 mg     08/31/2012      Route:  Oral    Class:  Normal    pantoprazole (PROTONIX) EC tablet 20 mg     08/31/2012      Route:  Oral    Class:  Normal    tiotropium (SPIRIVA) inhalation capsule 18 mcg     08/31/2012      Route:  Inhalation    Class:  Normal        Level of Service     LOS - NO CHARGE [NC1]      Follow-up and Disposition     Routing History Recorded        All Charges for This  Encounter       Code Description Service Date Service Provider Modifiers Quantity    93000 PR ELECTROCARDIOGRAM, COMPLETE 08/31/2012 Rosalio Macadamia, NP   1    NC1 LOS - NO CHARGE 08/31/2012 Rosalio Macadamia, NP   1      Patient Instructions     We are going to send you to the hospital today        Chart Reviewed By     Kathleene Hazel, MD  on 08/31/2012  1:55 PM        Previous Visit         Provider Department Encounter #    08/24/2012  3:26 PM Jamie Fredrickson, NP  [employer] [id number]

## 2012-08-31 NOTE — Progress Notes (Signed)
[name] Date of Birth: Sep 24, 1941 Medical Record #[id number]  History of Present Illness: [name] is seen today for a work in visit. She is seen for Dr. Clifton James. She has a quite complex history which includes CAD with prior stenting procedures, CVA, ischemic CM, and COPD. She had an anterior MI in 1990 treated with TPA followed by PTCA. She has had a DES to the LCX for unstable angina back in July of 2010. Last cath per Dr. Juanda Chance in February of 2011 with stable LAD disease, patent LCX stent and 50 to 70% mid RCA stent with an EF of 45%.  She comes in today. She is here alone. She spent most of October at the beach. While there she had "horrible" chest pain while at the hotel. Took aspirin and NTG x 3 with no relief. She got her chair massage and did a paraffin wax but her pain continued for several hours. EMS was called. She does not know what her work up consisted of. She says she had an MRI. Said she was told this was pericarditis and given Indocin which she could not tolerate. She was discharged and spent a few more days at the beach. Traveled almost 2 weeks ago to her daughter's house near [place]. Two weekends ago, she developed left hand weakness and could not talk. Family called EMS again. She was taken to a local hospital in [place] and says she was given TPA, then transferred to an ICU in [place]. Discharged on Plavix. No deficits except a little mouth droop. Has been back home in [place] since last Thursday. Had recurrent chest pain over the weekend. None today. EKG here in the office is markedly different from past tracing in June. Now admitted for further evaluation. She is currently painfree.  Unfortunately, we have not received any of her records.   Current Outpatient Prescriptions on File Prior to Visit  Medication Sig Dispense Refill  . albuterol (PROAIR HFA) 108 (90 BASE) MCG/ACT inhaler Inhale 2 puffs into the lungs every 6 (six) hours as needed for wheezing.  3 Inhaler  3    . alendronate (FOSAMAX) 70 MG tablet Take 70 mg by mouth every 7 (seven) days. Wednesday Take with a full glass of water on an empty stomach.      Marland Kitchen aspirin 81 MG tablet Take 325 mg by mouth daily.       . calcium-vitamin D (OSCAL WITH D) 500-200 MG-UNIT per tablet Take 1 tablet by mouth daily.        [name] Sodium 30-100 MG CAPS Take 1 tablet by mouth daily. For constipation      . Chlorpheniramine-Pseudoeph (ALLEREST MAXIMUM STRENGTH PO) Take 1 tablet by mouth daily.        . Cholecalciferol (VITAMIN D3) 2000 UNITS TABS Take 1 tablet by mouth 2 (two) times daily.       [name] Leaf (URINARY TRACT HEALTH) 365-50 MG CAPS Take 1 capsule by mouth daily.       Marland Kitchen darifenacin (ENABLEX) 7.5 MG 24 hr tablet Take 7.5 mg by mouth at bedtime.      . Fluticasone-Salmeterol (ADVAIR DISKUS) 250-50 MCG/DOSE AEPB Inhale 1 puff into the lungs 2 (two) times daily.  2 each  0  . Garlic (ODORLESS GARLIC) 1250 MG TABS Take 1 tablet by mouth every other day.       Marland Kitchen HYDROcodone-homatropine (HYCODAN) 5-1.5 MG/5ML syrup Take 2 tsp twice daily as needed  90 mL  0  .  L-Lysine 500 MG TABS Take 1 tablet by mouth every other day.       . metoprolol succinate (TOPROL-XL) 25 MG 24 hr tablet Take 12.5 mg by mouth daily.       . Multiple Vitamin (MULTIVITAMIN) capsule Take 0.5 capsules by mouth 2 (two) times daily.       . nitroGLYCERIN (NITROSTAT) 0.4 MG SL tablet Place 0.4 mg under the tongue every 5 (five) minutes as needed. Heart failure      . Omega-3 Fatty Acids (FISH OIL) 1200 MG CAPS Take 1 capsule by mouth daily.       . pantoprazole (PROTONIX) 20 MG tablet Take 1 tablet (20 mg total) by mouth daily.  30 tablet  1  . polyethylene glycol (MIRALAX / GLYCOLAX) packet Take 17 g by mouth daily.      . simvastatin (ZOCOR) 40 MG tablet Take 1 tablet (40 mg total) by mouth at bedtime.  90 tablet  3  . tiotropium (SPIRIVA HANDIHALER) 18 MCG inhalation capsule Place 1 capsule (18 mcg total) into  inhaler and inhale daily.  20 capsule  0  . vitamin B-12 (CYANOCOBALAMIN) 1000 MCG tablet Take 1,000 mcg by mouth daily.         Current Facility-Administered Medications on File Prior to Visit  Medication Dose Route Frequency Provider Last Rate Last Dose  . 0.9 %  sodium chloride infusion  500 mL Intravenous Continuous Mardella Layman, MD        Allergies  Allergen Reactions  . Escitalopram Oxalate Shortness Of Breath, Diarrhea and Nausea And Vomiting    Hot flashes, dizziness  . Cephalexin Other (See Comments)    Complicated by C dif colitis  . Codeine Nausea Only  . Sulfonamide Derivatives Other (See Comments)    Drug induced hepatitis    Past Medical History  Diagnosis Date  . CAD (coronary artery disease)   . CVA (cerebral vascular accident)   . Myocardial infarct   . Other emphysema   . Unspecified arthropathy, shoulder region   . Gastric ulcer, unspecified as acute or chronic, without mention of hemorrhage, perforation, or obstruction   . Benign paroxysmal positional vertigo   . COPD (chronic obstructive pulmonary disease)   . Hypertension   . Diverticula of colon   . Osteopenia   . Personal history of colonic polyps 2007    tubular adenoma high grade dysplasia  . Diverticulitis   . Hyperlipemia   . Fracture of ankle, bimalleolar, right, closed   . Hypoxemia   . Tubular adenoma   . Benign positional vertigo   . Arthritis     left shoulder  . C. difficile diarrhea     Past Surgical History  Procedure Date  . Total abdominal hysterectomy w/ bilateral salpingoophorectomy   . Angioplasty     x 3  . Total shoulder arthroplasty 2012    left  . Orif ankle fracture 2007    right  . Breast enhancement surgery     bilateral  . Martial megeti     bladder  . Varicose vein surgery   . Ptca   . Coronary angioplasty with stent placement 2010  . Laparoscopically assisted sigmoid colectomy 07/07/2011    Dr [name]  . Total shoulder replacement 12/05/2010    Dr  Rennis Chris    History  Smoking status  . Former Smoker -- 1.0 packs/day for 40 years  . Types: Cigarettes  . Quit date: 10/20/1997  Smokeless tobacco  . Never Used  History  Alcohol Use  . Yes    Comment: occasionally    Family History  Problem Relation Age of Onset  . Breast cancer Sister     x 2  . Colon cancer Neg Hx   . Diabetes Paternal Grandmother   . Uterine cancer Paternal Grandmother   . Lung cancer Paternal Grandmother   . Heart disease Mother   . Heart disease Father   . Pancreatic cancer Cousin   . Colon polyps Sister   . Diabetes Maternal Uncle   . Heart disease Maternal Grandfather   . Heart disease Paternal Grandfather   . Stroke Paternal Grandfather     Review of Systems: The review of systems is per the HPI. She has chronic shortness of breath. No passing out. Not dizzy. Currently taking aspirin full strength and Plavix. Has bruising.  All other systems were reviewed and are negative.  Physical Exam: BP 122/72  Pulse 76  Ht 5' 4.5" (1.638 m)  Wt 160 lb (72.576 kg)  BMI 27.04 kg/m2 Patient is very pleasant and in no acute distress. Skin is warm and dry. Color is normal.  HEENT is unremarkable. Normocephalic/atraumatic. PERRL. Sclera are nonicteric. Neck is supple. No masses. No JVD. Lungs are clear. Cardiac exam shows a regular rate and rhythm. Abdomen is soft. Extremities are without edema. Gait and ROM are intact. No gross neurologic deficits noted.   LABORATORY DATA: EKG shows sinus rhythm. She has diffuse T wave changes suggestive of ischemia. Tracing is reviewed with Dr. Clifton James.    Assessment / Plan:  Chest pain - abnormal EKG. Her symptoms are quite worrisome. She is subsequently seen with Dr. Clifton James. We have recommended admission and plan to repeat her cardiac cath. She is quite adamant about driving herself over and valet parking. We will stay try to obtain her records. Probably needs echo as well. Will continue Plavix/aspirin. Patient is  agreeable to this plan and is sent on to [facility].

## 2012-08-31 NOTE — Progress Notes (Signed)
Patient refusing chest x-ray. Stated she recently had one done at another facility and does not want to be exposed to any additional radiation. Discussed with Ronie Spies, PA for Dr. Clifton James. Order received to D/C chest x-ray and document for MD follow-up.  Continuing to monitor.

## 2012-08-31 NOTE — Progress Notes (Signed)
Pt requesting to not have another CXR, as she says she recently had one during a recent hospitalization at the [facility] and wishes not to be exposed to more radiation.  Ronie Spies, PA notified.  Per progress notes, records are in process of being obtained.  Will continue to monitor. Nolon Nations

## 2012-08-31 NOTE — Progress Notes (Signed)
I have seen Ms. Wyble today along with Norma Fredrickson and agree with the plan for admission overnight with chest pain and EKG changes. Plan cath tomorrow. Pt in agreement.   Johntae Broxterman 1:55 PM 08/31/2012

## 2012-09-01 ENCOUNTER — Encounter (HOSPITAL_COMMUNITY)
Admission: AD | Disposition: A | Discharge status: Home / Self Care | Payer: Self-pay | Source: Ambulatory Visit | Attending: Cardiovascular Disease

## 2012-09-01 ENCOUNTER — Ambulatory Visit (HOSPITAL_COMMUNITY): Admission: RE | Admit: 2012-09-01 | Payer: Medicare Other | Source: Ambulatory Visit | Admitting: Cardiovascular Disease

## 2012-09-01 ENCOUNTER — Other Ambulatory Visit: Payer: Self-pay

## 2012-09-01 DIAGNOSIS — I2510 Atherosclerotic heart disease of native coronary artery without angina pectoris: Secondary | ICD-10-CM

## 2012-09-01 HISTORY — PX: LEFT HEART CATHETERIZATION WITH CORONARY ANGIOGRAM: SHX5451

## 2012-09-01 LAB — TROPONIN I: Troponin I: <0.3 ng/mL (ref <0.30)

## 2012-09-01 LAB — POCT ACTIVATED CLOTTING TIME: Activated Clotting Time: 374 seconds

## 2012-09-01 SURGERY — LEFT HEART CATHETERIZATION WITH CORONARY ANGIOGRAM
Anesthesia: LOCAL

## 2012-09-01 MED ORDER — LORATADINE 10 MG PO TABS
10.0000 mg | ORAL_TABLET | Freq: Every day | ORAL | Status: DC
  Administered 2012-09-01 – 2012-09-02 (×2): 10 mg via ORAL
  Filled 2012-09-01 (×3): qty 1

## 2012-09-01 MED ORDER — LIDOCAINE HCL (PF) 1 % IJ SOLN
INTRAMUSCULAR | Status: AC
  Filled 2012-09-01: qty 60

## 2012-09-01 MED ORDER — DOCUSATE SODIUM 100 MG PO CAPS
100.0000 mg | ORAL_CAPSULE | Freq: Every day | ORAL | Status: DC
  Administered 2012-09-01 – 2012-09-02 (×2): 100 mg via ORAL
  Filled 2012-09-01 (×3): qty 1

## 2012-09-01 MED ORDER — NITROGLYCERIN 0.2 MG/ML ON CALL CATH LAB
INTRAVENOUS | Status: AC
  Filled 2012-09-01: qty 1

## 2012-09-01 MED ORDER — ALPRAZOLAM 0.5 MG PO TABS
0.5000 mg | ORAL_TABLET | Freq: Once | ORAL | Status: AC
  Administered 2012-09-01: 0.5 mg via ORAL
  Filled 2012-09-01: qty 1

## 2012-09-01 MED ORDER — POTASSIUM CHLORIDE CRYS ER 20 MEQ PO TBCR
40.0000 meq | EXTENDED_RELEASE_TABLET | Freq: Once | ORAL | Status: AC
  Administered 2012-09-01: 40 meq via ORAL
  Filled 2012-09-01: qty 2

## 2012-09-01 MED ORDER — MIDAZOLAM HCL 2 MG/2ML IJ SOLN
INTRAMUSCULAR | Status: AC
  Filled 2012-09-01: qty 2

## 2012-09-01 MED ORDER — FAMOTIDINE IN NACL 20-0.9 MG/50ML-% IV SOLN
INTRAVENOUS | Status: AC
  Filled 2012-09-01: qty 50

## 2012-09-01 MED ORDER — SODIUM CHLORIDE 0.9 % IV SOLN
INTRAVENOUS | Status: AC
  Administered 2012-09-01: 16:00:00 via INTRAVENOUS

## 2012-09-01 MED ORDER — HEPARIN (PORCINE) IN NACL 2-0.9 UNIT/ML-% IJ SOLN
INTRAMUSCULAR | Status: AC
  Filled 2012-09-01: qty 1000

## 2012-09-01 MED ORDER — FENTANYL CITRATE 0.05 MG/ML IJ SOLN
INTRAMUSCULAR | Status: AC
  Filled 2012-09-01: qty 2

## 2012-09-01 MED ORDER — BIVALIRUDIN 250 MG IV SOLR
INTRAVENOUS | Status: AC
  Filled 2012-09-01: qty 250

## 2012-09-01 NOTE — Interval H&P Note (Signed)
History and Physical Interval Note:  09/01/2012 1:20 PM  [name]  has presented today for cardiac cath  with the diagnosis of cp/abnormal ekg  The various methods of treatment have been discussed with the patient and family. After consideration of risks, benefits and other options for treatment, the patient has consented to  Procedure(s) (LRB) with comments: LEFT HEART CATHETERIZATION WITH CORONARY ANGIOGRAM (N/A) as a surgical intervention .  The patient's history has been reviewed, patient examined, no change in status, stable for surgery.  I have reviewed the patient's chart and labs.  Questions were answered to the patient's satisfaction.     [name]

## 2012-09-01 NOTE — Progress Notes (Addendum)
    SUBJECTIVE: No chest pain or SOB  BP 114/55  Pulse 65  Temp 97.3 F (36.3 C) (Oral)  Resp 18  Ht 5' 4.57" (1.64 m)  Wt 160 lb 0.9 oz (72.6 kg)  BMI 26.99 kg/m2  SpO2 100%  Intake/Output Summary (Last 24 hours) at 09/01/12 0725 Last data filed at 08/31/12 1900  Gross per 24 hour  Intake    900 ml  Output      0 ml  Net    900 ml    PHYSICAL EXAM General: Well developed, well nourished, in no acute distress. Alert and oriented x 3.  Psych:  Good affect, responds appropriately Neck: No JVD. No masses noted.  Lungs: Clear bilaterally with no wheezes or rhonci noted.  Heart: RRR with no murmurs noted. Abdomen: Bowel sounds are present. Soft, non-tender.  Extremities: No lower extremity edema.   LABS: Basic Metabolic Panel:  Basename 08/31/12 1554  NA 141  K 3.6  CL 105  CO2 26  GLUCOSE 126*  BUN 23  CREATININE 0.85  CALCIUM 10.1  MG 2.1  PHOS --   CBC:  Basename 08/31/12 1554  WBC 9.0  NEUTROABS 6.2  HGB 14.3  HCT 42.0  MCV 88.8  PLT 199   Cardiac Enzymes:  Basename 09/01/12 0247 08/31/12 2016 08/31/12 1554  CKTOTAL -- -- --  CKMB -- -- --  CKMBINDEX -- -- --  TROPONINI <0.30 <0.30 <0.30   Current Meds:    . [COMPLETED] ALPRAZolam  0.5 mg Oral Once  . [COMPLETED] aspirin  324 mg Oral NOW   Or  . [COMPLETED] aspirin  300 mg Rectal NOW  . [COMPLETED] aspirin  324 mg Oral Pre-Cath  . aspirin EC  81 mg Oral Daily  . Chlorhexidine Gluconate Cloth  6 each Topical Q0600  . clopidogrel  75 mg Oral Q breakfast  . darifenacin  7.5 mg Oral QHS  . diazepam  5 mg Oral On Call  . [COMPLETED] enoxaparin (LOVENOX) injection  70 mg Subcutaneous Once  . enoxaparin (LOVENOX) injection  70 mg Subcutaneous Q12H  . metoprolol succinate  25 mg Oral Daily  . mometasone-formoterol  2 puff Inhalation BID  . mupirocin ointment  1 application Nasal BID  . pantoprazole  20 mg Oral Daily  . simvastatin  40 mg Oral q1800  . sodium chloride  3 mL Intravenous Q12H    . sodium chloride  3 mL Intravenous Q12H  . tiotropium  18 mcg Inhalation Daily     ASSESSMENT AND PLAN:  1. Known CAD with recent episodes of chest pain concerning for unstable angina. Admitted yesterday. Cardiac markers negative. Plans for left heart cath with possible PCI today. Continue ASA and Plavix. Replace potassium this am. Clear liquid breakfast the NPO.   [name]  11/13/20137:25 AM

## 2012-09-01 NOTE — Care Management Note (Unsigned)
    Page 1 of 1   09/01/2012     10:42:07 AM   CARE MANAGEMENT NOTE 09/01/2012  Patient:  [name], [name]   Account Number:  [id number]  Date Initiated:  09/01/2012  Documentation initiated by:  GRAVES-BIGELOW,Ja Ohman  Subjective/Objective Assessment:   Pt admitted with cp. Plan for cath today.     Action/Plan:   CM will continue to monitor for disposition needs.   Anticipated DC Date:  09/02/2012   Anticipated DC Plan:  HOME/SELF CARE      DC Planning Services  CM consult      Choice offered to / List presented to:             Status of service:  In process, will continue to follow Medicare Important Message given?   (If response is "NO", the following Medicare IM given date fields will be blank) Date Medicare IM given:   Date Additional Medicare IM given:    Discharge Disposition:    Per UR Regulation:  Reviewed for med. necessity/level of care/duration of stay  If discussed at Long Length of Stay Meetings, dates discussed:    Comments:

## 2012-09-01 NOTE — Progress Notes (Signed)
UR Completed Addison Freimuth Graves-Bigelow, RN,BSN [phone]  

## 2012-09-01 NOTE — H&P (View-Only) (Signed)
    SUBJECTIVE: No chest pain or SOB  BP 114/55  Pulse 65  Temp 97.3 F (36.3 C) (Oral)  Resp 18  Ht 5' 4.57" (1.64 m)  Wt 160 lb 0.9 oz (72.6 kg)  BMI 26.99 kg/m2  SpO2 100%  Intake/Output Summary (Last 24 hours) at 09/01/12 0725 Last data filed at 08/31/12 1900  Gross per 24 hour  Intake    900 ml  Output      0 ml  Net    900 ml    PHYSICAL EXAM General: Well developed, well nourished, in no acute distress. Alert and oriented x 3.  Psych:  Good affect, responds appropriately Neck: No JVD. No masses noted.  Lungs: Clear bilaterally with no wheezes or rhonci noted.  Heart: RRR with no murmurs noted. Abdomen: Bowel sounds are present. Soft, non-tender.  Extremities: No lower extremity edema.   LABS: Basic Metabolic Panel:  Basename 08/31/12 1554  NA 141  K 3.6  CL 105  CO2 26  GLUCOSE 126*  BUN 23  CREATININE 0.85  CALCIUM 10.1  MG 2.1  PHOS --   CBC:  Basename 08/31/12 1554  WBC 9.0  NEUTROABS 6.2  HGB 14.3  HCT 42.0  MCV 88.8  PLT 199   Cardiac Enzymes:  Basename 09/01/12 0247 08/31/12 2016 08/31/12 1554  CKTOTAL -- -- --  CKMB -- -- --  CKMBINDEX -- -- --  TROPONINI <0.30 <0.30 <0.30   Current Meds:    . [COMPLETED] ALPRAZolam  0.5 mg Oral Once  . [COMPLETED] aspirin  324 mg Oral NOW   Or  . [COMPLETED] aspirin  300 mg Rectal NOW  . [COMPLETED] aspirin  324 mg Oral Pre-Cath  . aspirin EC  81 mg Oral Daily  . Chlorhexidine Gluconate Cloth  6 each Topical Q0600  . clopidogrel  75 mg Oral Q breakfast  . darifenacin  7.5 mg Oral QHS  . diazepam  5 mg Oral On Call  . [COMPLETED] enoxaparin (LOVENOX) injection  70 mg Subcutaneous Once  . enoxaparin (LOVENOX) injection  70 mg Subcutaneous Q12H  . metoprolol succinate  25 mg Oral Daily  . mometasone-formoterol  2 puff Inhalation BID  . mupirocin ointment  1 application Nasal BID  . pantoprazole  20 mg Oral Daily  . simvastatin  40 mg Oral q1800  . sodium chloride  3 mL Intravenous Q12H    . sodium chloride  3 mL Intravenous Q12H  . tiotropium  18 mcg Inhalation Daily     ASSESSMENT AND PLAN:  1. Known CAD with recent episodes of chest pain concerning for unstable angina. Admitted yesterday. Cardiac markers negative. Plans for left heart cath with possible PCI today. Continue ASA and Plavix. Replace potassium this am. Clear liquid breakfast the NPO.   [name]  11/13/20137:25 AM  

## 2012-09-01 NOTE — CV Procedure (Addendum)
Cardiac Catheterization Operative Report  [name] [id number] 11/13/20132:28 PM Jamie Melnick, MD  Procedure Performed:  1. Left Heart Catheterization 2. Selective Coronary Angiography 3. Left ventricular angiogram 4.   PTCA/DES x 1 proximal LAD  Operator: [name], MD  Indication: 71 yo WF with history of CAD with prior stenting procedures, CVA, ischemic CM, and COPD here today for cardiac cath. She had an anterior MI in 1990 treated with TPA followed by PTCA. She has had a DES to the LCX for unstable angina back in July of 2010. Last cath per Dr. Juanda Chance in February of 2011 with stable LAD disease, patent LCX stent and 50% mid RCA stenosis with an EF of 45%. Over the last month, she has been admitted to an outside hospital with chest pain and was told she had pericarditis. She was then admitted at another hospital in [place] 2 weeks ago with a CVA. She was seen in our office yesterday for c/o continued chest pain. EKG with new T wave inversions. Admitted for rule out overnight. This is felt to be unstable angina based on her symptoms and presentation.                                       Procedure Details: The risks, benefits, complications, treatment options, and expected outcomes were discussed with the patient. The patient and/or family concurred with the proposed plan, giving informed consent. The patient was brought to the cath lab after IV hydration was begun and oral premedication was given. The patient was further sedated with Versed and Fentanyl. The left groin was prepped and draped in the usual manner. Using the modified Seldinger access technique, a 5 French sheath was placed in the left femoral artery. Standard diagnostic catheters were used to perform selective coronary angiography. A pigtail catheter was used to perform a left ventricular angiogram. She was found to have a severe stenosis in the proximal LAD. The sheath was upsized to a 6 [place] system. I  then engaged the left main with a XB 3.0 guiding catheter. She was given a bolus of Angiomax and a drip was started. She has been on Plavix on a daily basis for the last 2 weeks. When the ACT was greater than 200, I passed a BMW wire down the LAD. A 2.5 x 12 mm balloon was used to pre-dilate the stenosis. I then deployed a 2.75 x 12 mm Promus Element DES in the proximal LAD. This was post-dilated with a 3.0 x 9 mm  balloon. There was an excellent angiographic result. The stenosis was taken from 90% down to 0%. There was excellent flow into the distal vessel. The guide and wire was removed. An Angioseal femoral artery closure device was deployed in the left femoral artery. Plavix 300 mg given x 1 at end of case (she has been on 75 mg per day).   There were no immediate complications. The patient was taken to the recovery area in stable condition.   Hemodynamic Findings: Central aortic pressure: 131/66 Left ventricular pressure: 136/15/23  Angiographic Findings:  Left main:  No obstructive disease.  Left Anterior Descending Artery: Moderate to large caliber vessel with 90% proximal stenosis (best seen in the LAO/cranial view as there is overlap of a small diagonal branch in the caudal views). The mid and distal vessel quickly taper to a small caliber system. The diagonal branch is moderate sized  and serial 30% stenoses.   Circumflex Artery: Moderate caliber vessel with patent mid stent extending into the second OM branch with no restenosis.   Right Coronary Artery: Moderate caliber dominant vessel with smooth 50% mid stenosis, unchanged from last cath.   Left Ventricular Angiogram: LVEF=35-40% with anteroapical, apical and inferoapical hypokinesis.   Impression: 1. Double vessel CAD  2. Severe stenosis in the proximal LAD 3. Unstable angina 4. Successful PTCA/DES proximal LAD  Recommendations: Will continue ASA and Plavix for lifetime. Continue other cardiac meds.        Complications:   None. The patient tolerated the procedure well.

## 2012-09-02 ENCOUNTER — Encounter (HOSPITAL_COMMUNITY): Payer: Self-pay | Admitting: Physician Assistant

## 2012-09-02 DIAGNOSIS — I2510 Atherosclerotic heart disease of native coronary artery without angina pectoris: Secondary | ICD-10-CM

## 2012-09-02 DIAGNOSIS — I200 Unstable angina: Secondary | ICD-10-CM | POA: Diagnosis present

## 2012-09-02 LAB — BASIC METABOLIC PANEL
Chloride: 102 mEq/L (ref 96–112)
Creatinine, Ser: 1.03 mg/dL (ref 0.50–1.10)
GFR calc Af Amer: 62 mL/min — ABNORMAL LOW (ref >90)
GFR calc non Af Amer: 53 mL/min — ABNORMAL LOW (ref >90)
Potassium: 3.8 mEq/L (ref 3.5–5.1)

## 2012-09-02 LAB — CBC
Platelets: 180 10*3/uL (ref 150–400)
RDW: 12.6 % (ref 11.5–15.5)
WBC: 7.8 10*3/uL (ref 4.0–10.5)

## 2012-09-02 MED ORDER — NITROGLYCERIN 0.4 MG SL SUBL: 0.4000 mg | SUBLINGUAL_TABLET | SUBLINGUAL | Status: DC | PRN

## 2012-09-02 MED ORDER — ASPIRIN EC 81 MG PO TBEC: 81.0000 mg | DELAYED_RELEASE_TABLET | Freq: Every day | ORAL | Status: DC

## 2012-09-02 MED ORDER — METOPROLOL SUCCINATE ER 25 MG PO TB24: 25.0000 mg | ORAL_TABLET | Freq: Every day | ORAL | Status: DC

## 2012-09-02 MED FILL — Dextrose Inj 5%: INTRAVENOUS | Qty: 50 | Status: AC

## 2012-09-02 NOTE — Discharge Summary (Signed)
See full note this am. cdm 

## 2012-09-02 NOTE — Progress Notes (Signed)
CARDIAC REHAB PHASE I   PRE:  Rate/Rhythm: 86 SR    BP: sitting 116/74    SaO2: 95 RA  MODE:  Ambulation: 500 ft   POST:  Rate/Rhythm: 95    BP: sitting 124/78     SaO2: 95 RA  Steady. SOB, fairly significant, which she sts is normal for her. Ed completed. Not interested in CRPII due to regimen (had done in the past).  [id number]  Harriet Masson CES, ACSM

## 2012-09-02 NOTE — Discharge Summary (Signed)
Discharge Summary   Patient ID: STARLETTA HOUCHIN MRN: [id number], DOB/AGE: 23-Dec-1940 71 y.o. Admit date: 08/31/2012 D/C date:     09/02/2012  Primary Cardiologist: Clifton James  Primary Discharge Diagnoses:  1. CAD/unstable angina - DES x1 to prox LAD - history: ant MI 1990 tx with TPA/PTCA, prior stenting procedures including RCA stent, DES to LCx 04/2011 2. Recent TIA 3. H/o CVA 4. Ischemic cardiomyopathy with EF 35-40% by cath 09/01/12 (45-50% 2011) - ACEI not started due to soft BP 5. HTN  Secondary Discharge Diagnoses:  1. COPD 2. Unspecified arthropathy, shoulder region 3. Gastric ulcer, unspecified as acute or chronic, without mention of hemorrhage, perforation, or obstruction  4. Benign paroxysmal positional vertigo  5. Diverticula of colon  6. Osteopenia  7. Personal history of colonic polyps 2007 tubular adenoma high grade dysplasia  8. Diverticulitis  9.Hyperlipemia  10. Fracture of ankle, bimalleolar, right, closed  11. H/o hypoxemia  12. 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 H/o c. difficile diarrhea  15. GERD 16. Hepatitis from sulfa 1989  Hospital Course: Ms.Lacher is a 71 y/o F with complex history which includes CAD with prior stenting procedures, CVA, ischemic CM, and COPD. She had an anterior MI in 1990 treated with TPA followed by PTCA. She has had a DES to the LCX for unstable angina back in July of 2010. Last cath per Dr. Juanda Chance in February of 2011 with stable LAD disease, patent LCX stent and 50 to 70% mid RCA stent with an EF of 45%. She came into the office as a work-in visit 08/31/12 for evaluation of chest pain. She spent most of [place] at the beach. While there, she had "horrible" chest pain while at the hotel. Took aspirin and NTG x 3 with no relief. She got her chair massage and did a paraffin wax but her pain continued for several hours. EMS was called. She does not know what her work up consisted of. She says she had an MRI and said she  was told this was pericarditis and given Indocin which she could not tolerate. She was discharged and spent a few more days at the beach. She traveled almost 2 weeks ago to her daughter's house near [place]. Two weekends ago, she developed left hand weakness and could not talk. Family called EMS again. She was taken to a local hospital in [place] and says she was given TPA, then transferred to an ICU in [place]. She was discharged on Plavix with no residual deficit except a little mouth droop. Since being back in [place], she had recurrent chest pain over the weekend. This prompted her to request a work-in visit on 11/12. EKG in the office was markedly different from past tracing in June. She was admitted to [facility] for evaluation and started on Lovenox for ACS. CE's returned negative. She underwent cath 09/01/12 demonstrating severe prox LAD disease (also patent Cx stent, 30% diag, 50% mid RCA stenosis -- this was unchanged from prior cath). EF was 35-40% with anteroapical, apical and inferoapical hypokinesis. The patient underwent successful PTCA/DES proximal LAD. Dr. Clifton James recommended ASA/Plavix for lifetime. The patient's blood pressure was on the soft side this admission thus will hold off on addition of ACEI per discussion with Dr. Clifton James.  The patient tolerated the procedure well. Dr. Clifton James has seen and examined her and feels she is stable for discharge.   Discharge Vitals: Blood pressure 116/63, pulse 78, temperature 98.1 F (36.7 C), temperature source  Oral, resp. rate 17, height 5' 4.57" (1.64 m), weight 169 lb 5 oz (76.8 kg), SpO2 92.00%.  Labs: Lab Results  Component Value Date   WBC 7.8 09/02/2012   HGB 13.1 09/02/2012   HCT 38.1 09/02/2012   MCV 88.4 09/02/2012   PLT 180 09/02/2012     Lab 09/02/12 0450 08/31/12 1554  NA 136 --  K 3.8 --  CL 102 --  CO2 25 --  BUN 23 --  CREATININE 1.03 --  CALCIUM 9.2 --  PROT -- 6.8  BILITOT -- 0.6  ALKPHOS -- 107  ALT  -- 18  AST -- 20  GLUCOSE 111* --    Basename 09/01/12 0247 08/31/12 2016 08/31/12 1554  CKTOTAL -- -- --  CKMB -- -- --  TROPONINI <0.30 <0.30 <0.30   Lab Results  Component Value Date   CHOL  Value: 110        ATP III CLASSIFICATION:  <200     mg/dL   Desirable  161-096  mg/dL   Borderline High  >=045    mg/dL   High        4/0/9811   HDL 43 05/22/2010   LDLCALC  Value: 52        Total Cholesterol/HDL:CHD Risk Coronary Heart Disease Risk Table                     Men   Women  1/2 Average Risk   3.4   3.3  Average Risk       5.0   4.4  2 X Average Risk   9.6   7.1  3 X Average Risk  23.4   11.0        Use the calculated Patient Ratio above and the CHD Risk Table to determine the patient's CHD Risk.        ATP III CLASSIFICATION (LDL):  <100     mg/dL   Optimal  914-782  mg/dL   Near or Above                    Optimal  130-159  mg/dL   Borderline  956-213  mg/dL   High  >086     mg/dL   Very High 02/24/8468   TRIG 77 05/22/2010   Lab Results  Component Value Date   DDIMER  Value: 0.43        AT THE INHOUSE ESTABLISHED CUTOFF VALUE OF 0.48 ug/mL FEU, THIS ASSAY HAS BEEN DOCUMENTED IN THE LITERATURE TO HAVE A SENSITIVITY AND NEGATIVE PREDICTIVE VALUE OF AT LEAST 98 TO 99%.  THE TEST RESULT SHOULD BE CORRELATED WITH AN ASSESSMENT OF THE CLINICAL PROBABILITY OF DVT / VTE. 04/24/2009    Diagnostic Studies/Procedures   No results found.  Discharge Medications   Current Discharge Medication List    CONTINUE these medications which have CHANGED   Details  aspirin EC 81 MG tablet Take 1 tablet (81 mg total) by mouth daily.    metoprolol succinate (TOPROL-XL) 25 MG 24 hr tablet Take 1 tablet (25 mg total) by mouth daily. Qty: 30 tablet, Refills: 6    nitroGLYCERIN (NITROSTAT) 0.4 MG SL tablet Place 1 tablet (0.4 mg total) under the tongue every 5 (five) minutes as needed for chest pain (up to 3 doses). Qty: 25 tablet, Refills: 4      CONTINUE these medications which have NOT CHANGED    Details  albuterol (PROVENTIL HFA;VENTOLIN HFA) 108 (90 BASE) MCG/ACT  inhaler Inhale 2 puffs into the lungs 2 (two) times daily. For shortness of breath    alendronate (FOSAMAX) 70 MG tablet Take 70 mg by mouth every 7 (seven) days. Wednesday Take with a full glass of water on an empty stomach.    calcium-vitamin D (OSCAL WITH D) 500-200 MG-UNIT per tablet Take 0.5 tablets by mouth 2 (two) times daily.     Casanthranol-Docusate Sodium 30-100 MG CAPS Take 1 tablet by mouth daily. For constipation    Cholecalciferol (VITAMIN D3) 2000 UNITS TABS Take 2 tablets by mouth 2 (two) times daily.     clopidogrel (PLAVIX) 75 MG tablet Take 75 mg by mouth daily.    Cranberry-Olive Leaf (URINARY TRACT HEALTH) 365-50 MG CAPS Take 1 capsule by mouth daily.     darifenacin (ENABLEX) 7.5 MG 24 hr tablet Take 7.5 mg by mouth at bedtime.    Fluticasone-Salmeterol (ADVAIR DISKUS) 250-50 MCG/DOSE AEPB Inhale 1 puff into the lungs 2 (two) times daily. Qty: 2 each, Refills: 0    Garlic (ODORLESS GARLIC) 1250 MG TABS Take 1 tablet by mouth every other day.     L-Lysine 500 MG TABS Take 1 tablet by mouth every other day.     loratadine (CLARITIN) 10 MG tablet Take 10 mg by mouth daily.    Multiple Vitamin (MULTIVITAMIN) capsule Take 0.5 capsules by mouth 2 (two) times daily.     Omega-3 Fatty Acids (FISH OIL) 1200 MG CAPS Take 1 capsule by mouth every evening.     pantoprazole (PROTONIX) 20 MG tablet Take 1 tablet (20 mg total) by mouth daily. Qty: 30 tablet, Refills: 1    simvastatin (ZOCOR) 40 MG tablet Take 1 tablet (40 mg total) by mouth at bedtime. Qty: 90 tablet, Refills: 3    tiotropium (SPIRIVA HANDIHALER) 18 MCG inhalation capsule Place 1 capsule (18 mcg total) into inhaler and inhale daily. Qty: 20 capsule, Refills: 0    vitamin B-12 (CYANOCOBALAMIN) 1000 MCG tablet Take 1,000 mcg by mouth daily.          Disposition   The patient will be discharged in stable condition to  home. Discharge Orders    Future Appointments: Provider: Department: Dept Phone: Center:   09/24/2012 1:45 PM Oretha Milch, MD [facility] [phone] None   10/07/2012 12:00 PM Kathleene Hazel, MD [facility] Main [facility] [place]) [phone] [id number]     Future Orders Please Complete By Expires   Diet - low sodium heart healthy      Increase activity slowly      Comments:   Do not drive for 2 days. No lifting over 5 lbs for 1 week. No sexual activity for 1 week.  Keep procedure site clean & dry. If you notice increased pain, swelling, bleeding or pus, call/return!  You may shower, but no soaking baths/hot tubs/pools for 1 week.   Discharge instructions      Comments:   You will continue Aspirin and Plavix for lifetime, unless otherwise instructed by your doctor.   EKG 12-Lead      Questions: Responses:   Where should this test be performed      Follow-up Information    Follow up with MCALHANY,CHRISTOPHER, MD. (10/07/12 at 12pm)    Contact information:   [address] [place] [address] [phone]            Duration of Discharge Encounter: Greater than 30 minutes including physician and PA time.  Signed, Ronie Spies  PA-C 09/02/2012, 8:45 AM

## 2012-09-02 NOTE — Progress Notes (Signed)
    SUBJECTIVE: No chest pain or SOB. No events.   BP 116/63  Pulse 78  Temp 98.1 F (36.7 C) (Oral)  Resp 17  Ht 5' 4.57" (1.64 m)  Wt 169 lb 5 oz (76.8 kg)  BMI 28.55 kg/m2  SpO2 92%  Intake/Output Summary (Last 24 hours) at 09/02/12 0732 Last data filed at 09/02/12 0540  Gross per 24 hour  Intake      0 ml  Output   2800 ml  Net  -2800 ml    PHYSICAL EXAM General: Well developed, well nourished, in no acute distress. Alert and oriented x 3.  Psych:  Good affect, responds appropriately Neck: No JVD. No masses noted.  Lungs: Clear bilaterally with no wheezes or rhonci noted.  Heart: RRR with systolic murmur noted. Abdomen: Bowel sounds are present. Soft, non-tender.  Extremities: No lower extremity edema. Both groins stable without hematoma.   LABS: Basic Metabolic Panel:  Basename 09/02/12 0450 08/31/12 1554  NA 136 141  K 3.8 3.6  CL 102 105  CO2 25 26  GLUCOSE 111* 126*  BUN 23 23  CREATININE 1.03 0.85  CALCIUM 9.2 10.1  MG -- 2.1  PHOS -- --   CBC:  Basename 09/02/12 0450 08/31/12 1554  WBC 7.8 9.0  NEUTROABS -- 6.2  HGB 13.1 14.3  HCT 38.1 42.0  MCV 88.4 88.8  PLT 180 199   Cardiac Enzymes:  Basename 09/01/12 0247 08/31/12 2016 08/31/12 1554  CKTOTAL -- -- --  CKMB -- -- --  CKMBINDEX -- -- --  TROPONINI <0.30 <0.30 <0.30    Current Meds:    . aspirin EC  81 mg Oral Daily  . [COMPLETED] bivalirudin      . Chlorhexidine Gluconate Cloth  6 each Topical Q0600  . clopidogrel  75 mg Oral Q breakfast  . darifenacin  7.5 mg Oral QHS  . [COMPLETED] diazepam  5 mg Oral On Call  . docusate sodium  100 mg Oral Daily  . [COMPLETED] famotidine      . [COMPLETED] fentaNYL      . [COMPLETED] heparin      . [COMPLETED] lidocaine      . loratadine  10 mg Oral Daily  . metoprolol succinate  25 mg Oral Daily  . [COMPLETED] midazolam      . [COMPLETED] midazolam      . mometasone-formoterol  2 puff Inhalation BID  . mupirocin ointment  1  application Nasal BID  . [COMPLETED] nitroGLYCERIN      . pantoprazole  20 mg Oral Daily  . [COMPLETED] potassium chloride  40 mEq Oral Once  . simvastatin  40 mg Oral q1800  . tiotropium  18 mcg Inhalation Daily  . [DISCONTINUED] sodium chloride  3 mL Intravenous Q12H  . [DISCONTINUED] sodium chloride  3 mL Intravenous Q12H     ASSESSMENT AND PLAN:  1.CAD/Unstable angina: Pt admitted from office with c/o chest pain concerning for UA. Cath yesterday with patent Circumflex stent and severe stenosis proximal LAD. Now s/p DES x 1 proximal LAD. She is doing well. Will need ASA and Plavix for lifetime. Continue statin and beta blocker.  2. Recent TIA: Continue ASA and Plavix.    3. Dispo: D/C home today. Follow up with me in 4-6 weeks.   [name]  11/14/20137:32 AM

## 2012-09-24 ENCOUNTER — Ambulatory Visit: Payer: Medicare Other | Admitting: Pulmonary Disease

## 2012-09-24 ENCOUNTER — Telehealth: Payer: Self-pay | Admitting: Pulmonary Disease

## 2012-09-24 NOTE — Telephone Encounter (Signed)
Called and spoke with patient she wants Dr. Vassie Loll to know that she is"very sorry and this is not like her" to miss appts. She states she has had a very rough 3 months and very rough past 30 days: has had a stent placed, a stroke, and a pericardial effusion. She stated this appt was to talk about starting her on a "drug for her lungs that requires blood monitoring" and knows she really needed to be here.  She had all the intentions of coming today but has had nausea,vomiting and diarrhea this past few days.  She set an alarm clock for the wrong time today which is why she did not make her appt.  She is asking to please not be charged for this.  Patient was rescheduled for 11/18/12, however patient would like to be seen before that.    Dr. Vassie Loll all your openings are on days patient states she will be out of town. (12/20-12/26, 12/29-10/22/12) . Please advise if it would be ok to dbl book her on a day that's convenient for her.  Thank You

## 2012-09-27 NOTE — Telephone Encounter (Signed)
Sorry to hear that [facility] to wait until jan I have reviewed her cardiac records . No med addition planned from pulm standpt.

## 2012-09-28 NOTE — Telephone Encounter (Signed)
Called and spoke with patient, made her aware of Dr. Carlena Sax recs as listed below.  Patient verbalized this and nothing further needed at this time.

## 2012-10-07 ENCOUNTER — Encounter: Payer: Self-pay | Admitting: Cardiovascular Disease

## 2012-10-07 ENCOUNTER — Ambulatory Visit (INDEPENDENT_AMBULATORY_CARE_PROVIDER_SITE_OTHER)
Admission: RE | Admit: 2012-10-07 | Discharge: 2012-10-07 | Disposition: A | Discharge status: Home / Self Care | Payer: Medicare Other | Source: Ambulatory Visit | Attending: Cardiovascular Disease | Admitting: Cardiovascular Disease

## 2012-10-07 ENCOUNTER — Ambulatory Visit (INDEPENDENT_AMBULATORY_CARE_PROVIDER_SITE_OTHER): Payer: Medicare Other | Admitting: Cardiovascular Disease

## 2012-10-07 VITALS — BP 123/65 | HR 80 | Ht 64.0 in | Wt 162.0 lb

## 2012-10-07 DIAGNOSIS — I2510 Atherosclerotic heart disease of native coronary artery without angina pectoris: Secondary | ICD-10-CM

## 2012-10-07 DIAGNOSIS — R0609 Other forms of dyspnea: Secondary | ICD-10-CM

## 2012-10-07 DIAGNOSIS — R0600 Dyspnea, unspecified: Secondary | ICD-10-CM

## 2012-10-07 DIAGNOSIS — I5043 Acute on chronic combined systolic (congestive) and diastolic (congestive) heart failure: Secondary | ICD-10-CM

## 2012-10-07 LAB — CBC WITH DIFFERENTIAL/PLATELET
Basophils Relative: 0.4 % (ref 0.0–3.0)
Eosinophils Absolute: 0.3 10*3/uL (ref 0.0–0.7)
Lymphocytes Relative: 19.4 % (ref 12.0–46.0)
MCHC: 33.8 g/dL (ref 30.0–36.0)
MCV: 89.7 fl (ref 78.0–100.0)
Monocytes Absolute: 0.7 10*3/uL (ref 0.1–1.0)
Neutrophils Relative %: 68.7 % (ref 43.0–77.0)
Platelets: 200 10*3/uL (ref 150.0–400.0)
RBC: 4.5 Mil/uL (ref 3.87–5.11)
WBC: 8.4 10*3/uL (ref 4.5–10.5)

## 2012-10-07 LAB — BASIC METABOLIC PANEL
BUN: 16 mg/dL (ref 6–23)
CO2: 30 mEq/L (ref 19–32)
Calcium: 9.6 mg/dL (ref 8.4–10.5)
Creatinine, Ser: 0.8 mg/dL (ref 0.4–1.2)

## 2012-10-07 LAB — BRAIN NATRIURETIC PEPTIDE: Pro B Natriuretic peptide (BNP): 68 pg/mL (ref 0.0–100.0)

## 2012-10-07 MED ORDER — FUROSEMIDE 40 MG PO TABS: 40.0000 mg | ORAL_TABLET | Freq: Every day | ORAL | Status: DC

## 2012-10-07 NOTE — Progress Notes (Signed)
History of Present Illness: 71 yo female with history of CAD s/p previous stenting procedures, CVA, ischemic cardiomyopathy, COPD who is here today for cardiac follow up. She has been followed in the past by Dr. Juanda Chance. Her cardiac history dates back to 58. She initially had an anterior MI in 1990 treated with TPA followed by PTCA. In July 2010 she had a drug eluting stent to the circumflex artery for unstable angina. Cath per Dr. Juanda Chance in February 2011 with stable LAD disease, patent Circumflex stent and 50-70% mid RCA stent. Her ejection fraction was 45% and a pulmonary wedge pressure was normal. Her symptoms were felt to be secondary to her COPD. She had a previous stroke treated with TPA in 2009. She also has severe obstructive pulmonary disease with an FEV1 of less than 1 L. She is followed by Dr. Vassie Loll with Pulmonary. She underwent sigmoid colectomy in September 2012 per Dr. [name]. She has had issues with abdominal and back pain. She had an episode of severe chest pain while [place] the beach in October 2013 and was admitted and told by Dr. Jackquline Berlin at [facility] that she had pericarditis. Two weeks later while in [place], she developed left hand weakness and could not talk. Family called EMS again. She was taken to a local hospital in [place] and says she was given TPA, then transferred to an ICU in [place]. Discharged on Plavix. No deficits except a little mouth droop. She was seen here 08/31/12 and had c/o chest pains. Cardiac cath  08/31/12 with severe stenosis proximal LAD now s/p drug eluting stent proximal LAD (2.75 x 12 mm Promus Element DES post-dilated with 3.0 balloon).   She is here today for follow up. Her chest pain has resolved. She does not dyspnea. She has COPD and has cough but this has been non-productive. No fevers. No big change in weight. She follows at home and her weight has only increased by 2-3 pounds over last few weeks. Mild LE edema. She is getting dyspneic with  minimal activity. No palpitations.   Primary Care Physician: [name]  Past Medical History  Diagnosis Date  . CAD (coronary artery disease)     a. ant MI 82 tx with TPA/PTCA. b. Prior stenting procedures including RCA stent, DES to LCx 04/2011. c. [place] -> DES  to prox LAD 08/2012.   Marland Kitchen Other emphysema   . Unspecified arthropathy, shoulder region   . Gastric ulcer, unspecified as acute or chronic, without mention of hemorrhage, perforation, or obstruction   . Benign paroxysmal positional vertigo   . COPD (chronic obstructive pulmonary disease)   . Hypertension   . Diverticula of colon   . Osteopenia   . Personal history of colonic polyps 2007    tubular adenoma high grade dysplasia  . Diverticulitis   . Hyperlipemia   . Fracture of ankle, bimalleolar, right, closed   . Hypoxemia   . C. difficile diarrhea   . Varicose vein   . Asthma   . GERD (gastroesophageal reflux disease)   . Hepatitis 1989  . Arthritis     "both shoulders"   . CVA (cerebral vascular accident)   . TIA (transient ischemic attack) 04/2008; 08/21/2012    "slight droop left lower lip after 08/21/12 TIA"  . On home oxygen therapy   . Ischemic cardiomyopathy     a. EF 45-50% 2011. b. EF 35-40% by cath 09/01/12.    Past Surgical History  Procedure Date  . Total shoulder  arthroplasty 2012    left  . Orif ankle fracture 2007    right  . Martial megeti 8657'Q    "bladder operation; not successful " (08/31/2012)  . Varicose vein surgery 1983; 1985    "both legs both times" (08/31/2012)  . Total shoulder replacement 12/05/2010    Dr Rennis Chris  . Abdominal hysterectomy     TAH w/BSO  . Augmentation mammaplasty 1975  . Coronary angioplasty with stent placement 2010  . Coronary angioplasty 1990    "3 times" (08/31/2012)  . Colon surgery 07/07/2011    Dr. [name]  . Appendectomy 1960    Current Outpatient Prescriptions  Medication Sig Dispense Refill  . albuterol (PROVENTIL HFA;VENTOLIN HFA) 108 (90 BASE) MCG/ACT  inhaler Inhale 2 puffs into the lungs 2 (two) times daily. For shortness of breath      . alendronate (FOSAMAX) 70 MG tablet Take 70 mg by mouth every 7 (seven) days. Wednesday Take with a full glass of water on an empty stomach.      Marland Kitchen aspirin EC 81 MG tablet Take 1 tablet (81 mg total) by mouth daily.      . calcium-vitamin D (OSCAL WITH D) 500-200 MG-UNIT per tablet Take 0.5 tablets by mouth 2 (two) times daily.       [name] Sodium 30-100 MG CAPS Take 1 tablet by mouth daily. For constipation      . Cholecalciferol (VITAMIN D3) 2000 UNITS TABS Take 2 tablets by mouth 2 (two) times daily.       . clopidogrel (PLAVIX) 75 MG tablet Take 75 mg by mouth daily.      [name] Leaf (URINARY TRACT HEALTH) 365-50 MG CAPS Take 1 capsule by mouth daily.       . Fluticasone-Salmeterol (ADVAIR DISKUS) 250-50 MCG/DOSE AEPB Inhale 1 puff into the lungs 2 (two) times daily.  2 each  0  . Garlic (ODORLESS GARLIC) 1250 MG TABS Take 1 tablet by mouth every other day.       Marland Kitchen L-Lysine 500 MG TABS Take 1 tablet by mouth every other day.       . loratadine (CLARITIN) 10 MG tablet Take 10 mg by mouth daily.      . metoprolol succinate (TOPROL-XL) 25 MG 24 hr tablet Take 1/2 tab daily      . Multiple Vitamin (MULTIVITAMIN) capsule Take 0.5 capsules by mouth 2 (two) times daily.       . nitroGLYCERIN (NITROSTAT) 0.4 MG SL tablet Place 1 tablet (0.4 mg total) under the tongue every 5 (five) minutes as needed for chest pain (up to 3 doses).  25 tablet  4  . Omega-3 Fatty Acids (FISH OIL) 1200 MG CAPS Take 1 capsule by mouth every evening.       . pantoprazole (PROTONIX) 20 MG tablet Take 1 tablet (20 mg total) by mouth daily.  30 tablet  1  . simvastatin (ZOCOR) 40 MG tablet Take 1 tablet (40 mg total) by mouth at bedtime.  90 tablet  3  . tiotropium (SPIRIVA HANDIHALER) 18 MCG inhalation capsule Place 1 capsule (18 mcg total) into inhaler and inhale daily.  20 capsule  0  . vitamin B-12  (CYANOCOBALAMIN) 1000 MCG tablet Take 1,000 mcg by mouth daily.          Allergies  Allergen Reactions  . Escitalopram Oxalate Shortness Of Breath, Diarrhea and Nausea And Vomiting    Hot flashes, dizziness  . Indomethacin Other (See Comments)    "made  me very very dizzy and made me have vertigo" (08/31/2012)  . Codeine Nausea And Vomiting    "I can take codeine in my cough medicine" (08/31/2012)  . Cephalexin Other (See Comments)    Complicated by C dif colitis  . Sulfonamide Derivatives Other (See Comments)    Drug induced hepatitis in 1989    History   Social History  . Marital Status: Single    Spouse Name: N/A    Number of Children: N/A  . Years of Education: N/A   Occupational History  . Realtor [employer]   Social History Main Topics  . Smoking status: Former Smoker -- 1.0 packs/day for 40 years    Types: Cigarettes    Quit date: 10/20/1997  . Smokeless tobacco: Never Used  . Alcohol Use: 1.8 oz/week    3 Glasses of wine per week     Comment: occasionally  . Drug Use: No  . Sexually Active: Not Currently   Other Topics Concern  . Not on file   Social History Narrative  . No narrative on file    Family History  Problem Relation Age of Onset  . Breast cancer Sister     x 2  . Colon cancer Neg Hx   . Diabetes Paternal Grandmother   . Uterine cancer Paternal Grandmother   . Lung cancer Paternal Grandmother   . Heart disease Mother   . Heart disease Father   . Pancreatic cancer Cousin   . Colon polyps Sister   . Diabetes Maternal Uncle   . Heart disease Maternal Grandfather   . Heart disease Paternal Grandfather   . Stroke Paternal Grandfather     Review of Systems:  As stated in the HPI and otherwise negative.   BP 123/65  Pulse 80  Ht 5\' 4"  (1.626 m)  Wt 162 lb (73.483 kg)  BMI 27.81 kg/m2  Physical Examination: General: Well developed, well nourished, NAD HEENT: OP clear, mucus membranes moist SKIN: warm, dry. No rashes. Neuro: No  focal deficits Musculoskeletal: Muscle strength 5/5 all ext Psychiatric: Mood and affect normal Neck: No JVD, no carotid bruits, no thyromegaly, no lymphadenopathy. Lungs:Clear bilaterally, no wheezes, rhonci, crackles Cardiovascular: Regular rate and rhythm. No murmurs, gallops or rubs. Abdomen:Soft. Bowel sounds present. Non-tender.  Extremities: No lower extremity edema. Pulses are 2 + in the bilateral DP/PT.  Cardiac cath 08/31/12: Left main: No obstructive disease.  Left Anterior Descending Artery: Moderate to large caliber vessel with 90% proximal stenosis (best seen in the LAO/cranial view as there is overlap of a small diagonal branch in the caudal views). The mid and distal vessel quickly taper to a small caliber system. The diagonal branch is moderate sized and serial 30% stenoses.  Circumflex Artery: Moderate caliber vessel with patent mid stent extending into the second OM branch with no restenosis.  Right Coronary Artery: Moderate caliber dominant vessel with smooth 50% mid stenosis, unchanged from last cath.  Left Ventricular Angiogram: LVEF=35-40% with anteroapical, apical and inferoapical hypokinesis.  Impression:  1. Double vessel CAD  2. Severe stenosis in the proximal LAD  3. Unstable angina  4. Successful PTCA/DES proximal LAD  EKG: NSR, rate 82 bpm. Old septal infarct. Flattened T waves inferior leads, lateral leads and anterolateral leads. Unchanged  Assessment and Plan:   1. CAD: Stable. Recent DES in LAD. I do not think her dyspnea is related to her CAD. EKG without evidence of ischemia. Continue ASA, Plavix, statin, beta blocker.   2. Dyspnea: She  has chronic systolic CHF with LVEF of 35-40%. She has mild LE edema and her weight has increased 2 pounds over last two weeks. Her recent dyspnea could be due to volume overload. Will start Lasix 40 mg po Qdaily. Will check CXR. Will check BMET, CBC and BNP.   3. Ischemic cardiomyopathy: LVEF around 40% by all recent  tests including LVGram, echo at [facility] in [facility] November 2013. Continue medical therapy.

## 2012-10-07 NOTE — Patient Instructions (Addendum)
Your physician recommends that you schedule a follow-up appointment in:  4 weeks.   Have chest X- ray done today at [facility] office on [facility]   Your physician has recommended you make the following change in your medication:  Start furosemide 40 mg by mouth daily

## 2012-10-08 ENCOUNTER — Telehealth: Payer: Self-pay | Admitting: Cardiovascular Disease

## 2012-10-08 MED ORDER — IMIPRAMINE HCL 10 MG PO TABS: ORAL_TABLET | ORAL | Status: DC

## 2012-10-08 NOTE — Telephone Encounter (Signed)
Pt called to have Imipramine 10 mg added to her medication list. Pt takes 2 tablets by mouth at bedtime.

## 2012-10-08 NOTE — Telephone Encounter (Signed)
New problem:  Seen on yesterday  Was told to call back with additional medication imipramine 10 mg. Two tablets every night . This need to be added to medication list.

## 2012-10-15 NOTE — Addendum Note (Signed)
Addended by: Williemae Area on: 10/15/2012 11:50 AM   Modules accepted: Orders

## 2012-10-30 ENCOUNTER — Other Ambulatory Visit: Payer: Self-pay | Admitting: Cardiovascular Disease

## 2012-11-02 ENCOUNTER — Telehealth: Payer: Self-pay | Admitting: Cardiovascular Disease

## 2012-11-02 NOTE — Telephone Encounter (Signed)
Pt having problem getting refill of alendronate, pt has this problem everytime she needs an rx, and was told in the future to call rose if she has the problem again, uses [facility]

## 2012-11-05 ENCOUNTER — Telehealth: Payer: Self-pay | Admitting: Pulmonary Disease

## 2012-11-05 MED ORDER — AZITHROMYCIN 250 MG PO TABS: ORAL_TABLET | ORAL | Status: DC

## 2012-11-05 NOTE — Telephone Encounter (Signed)
Called and spoke with pt and she stated that she has bronchitis and is needing abx called in to her pharmacy.  Pt stated that she has benzonatate to take for the cough.  Cough is dry and pt denies any fever. Pt has appt with RA on 1/30 at 2pm.  Please advise.  Thanks   Allergies  Allergen Reactions  . Escitalopram Oxalate Shortness Of Breath, Diarrhea and Nausea And Vomiting    Hot flashes, dizziness  . Indomethacin Other (See Comments)    "made me very very dizzy and made me have vertigo" (08/31/2012)  . Codeine Nausea And Vomiting    "I can take codeine in my cough medicine" (08/31/2012)  . Cephalexin Other (See Comments)    Complicated by C dif colitis  . Sulfonamide Derivatives Other (See Comments)    Drug induced hepatitis in 1989

## 2012-11-05 NOTE — Telephone Encounter (Signed)
z-pak 

## 2012-11-05 NOTE — Telephone Encounter (Signed)
I spoke with pt. Aware of RA recs. I have sent RX into the pharmacy. Nothing further was needed

## 2012-11-09 ENCOUNTER — Telehealth: Payer: Self-pay | Admitting: Pulmonary Disease

## 2012-11-09 MED ORDER — ALBUTEROL SULFATE HFA 108 (90 BASE) MCG/ACT IN AERS: 2.0000 | INHALATION_SPRAY | Freq: Two times a day (BID) | RESPIRATORY_TRACT | Status: DC

## 2012-11-09 NOTE — Telephone Encounter (Signed)
Spoke with patient, states at last visit 05/04/2012 was given Rx for Ventolin but lost it.  Patient requesting 90 day Rx be sent  Into [facility].  Rx has been sent.  Future appt w Dr. Vassie Loll verified (11/18/12 at 2pm). Nothig further needed at this time.

## 2012-11-11 ENCOUNTER — Ambulatory Visit: Payer: Medicare Other | Admitting: Cardiovascular Disease

## 2012-11-18 ENCOUNTER — Ambulatory Visit: Payer: Self-pay | Admitting: Pulmonary Disease

## 2012-11-22 ENCOUNTER — Telehealth: Payer: Self-pay | Admitting: Cardiovascular Disease

## 2012-11-22 ENCOUNTER — Encounter: Payer: Self-pay | Admitting: Pulmonary Disease

## 2012-11-22 ENCOUNTER — Ambulatory Visit (INDEPENDENT_AMBULATORY_CARE_PROVIDER_SITE_OTHER): Payer: Medicare Other | Admitting: Pulmonary Disease

## 2012-11-22 VITALS — BP 112/64 | HR 94 | Temp 97.0°F | Ht 64.5 in | Wt 169.2 lb

## 2012-11-22 DIAGNOSIS — J449 Chronic obstructive pulmonary disease, unspecified: Secondary | ICD-10-CM

## 2012-11-22 MED ORDER — CLOPIDOGREL BISULFATE 75 MG PO TABS: 75.0000 mg | ORAL_TABLET | Freq: Every day | ORAL | Status: DC

## 2012-11-22 NOTE — Assessment & Plan Note (Addendum)
Ambulatory satn OK- so doubt that O2 will be helpful for this level of exertion Stay on advair & spiriva Use albuterol nebs twice daily x 2 weeks & report If no better, will change metoprolol to bystolic 5 mg Enroll in rehab program is key here for deconditioning- she had 2 surgeries over last year. Lets hold off on theophylline - for fear of arrythmias Unclear how much of her dyspnea is related to recent cardiac issues - no evidence of overt heart failure

## 2012-11-22 NOTE — Patient Instructions (Addendum)
Ambulatory satn Use albuterol nebs twice daily x 2 weeks & report If no better, will change metoprolol to bystolic 5 mg If this does not help, you will have to enroll in rehab program Lets hold off on theophylline

## 2012-11-22 NOTE — Telephone Encounter (Signed)
Pt requesting refill of plavix and generic foxamax at [facility]

## 2012-11-22 NOTE — Progress Notes (Signed)
  Subjective:    Patient ID: KANDIE KEIPER, female    DOB: 1941-04-06, 72 y.o.   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  She smoked for 40 years and quit in 1999. Advair worked better than symbicort.     Sick visit 6/11 chest cold - cefdinir helped ! (avelox too expensive) Completed course of steroids.  ER visit in dec '11 - received steroids x 4 ds   7/12 >>She is interested in research study -did not qualify due to poor lung function.  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    11/22/2012 she has had a very rough 3 months - had a stent placed and a stroke. Cardiac cath 08/31/12 with severe stenosis proximal LAD now s/p drug eluting stent proximal LAD  1/14 zpak pt c/o increase SOB w/ exertion. She has to rest for 10 minutes after doing something to catch her breathe. Pt uses oxygen at bedtime. c/o wheezing. denies any chest tx [facility] where she went for a second opinion  - rehab &  Theophylline advised Remains reluctant to enroll in pulm rehab compliant with noct O2 Satn dropped to 90 % on walking 3 laps  Review of Systems neg for any significant sore throat, dysphagia, itching, sneezing, nasal congestion or excess/ purulent secretions, fever, chills, sweats, unintended wt loss, pleuritic or exertional cp, hempoptysis, orthopnea pnd or change in chronic leg swelling. Also denies presyncope, palpitations, heartburn, abdominal pain, nausea, vomiting, diarrhea or change in bowel or urinary habits, dysuria,hematuria, rash, arthralgias, visual complaints, headache, numbness  weakness or ataxia.     Objective:   Physical Exam   Gen. Pleasant, well-nourished, in no distress, normal affect ENT - no lesions, no post nasal drip Neck: No JVD, no thyromegaly, no carotid bruits Lungs: no use of accessory muscles, no dullness to percussion, clear without rales or rhonchi  Cardiovascular: Rhythm regular, heart sounds  normal, no murmurs or gallops, no peripheral edema Abdomen: soft and non-tender, no hepatosplenomegaly, BS normal. Musculoskeletal: No deformities, no cyanosis or clubbing Neuro:  alert, non focal        Assessment & Plan:

## 2012-11-22 NOTE — Telephone Encounter (Signed)
Refilled the Plavix for 1 year supply Not sure if Dr. Clifton James will refill Fosamax will route to appropiate person

## 2012-11-23 ENCOUNTER — Telehealth: Payer: Self-pay

## 2012-11-25 ENCOUNTER — Telehealth: Payer: Self-pay

## 2012-11-25 DIAGNOSIS — M899 Disorder of bone, unspecified: Secondary | ICD-10-CM

## 2012-11-25 MED ORDER — CLOPIDOGREL BISULFATE 75 MG PO TABS: 75.0000 mg | ORAL_TABLET | Freq: Every day | ORAL | Status: DC

## 2012-11-25 MED ORDER — ALENDRONATE SODIUM 70 MG PO TABS: 70.0000 mg | ORAL_TABLET | ORAL | Status: DC

## 2012-11-25 NOTE — Telephone Encounter (Signed)
I have reviewed pt's chart. First refill request for fosomax was received on October 30, 2012 and this was denied with request sent to pharmacy to contact pt's primary MD. Additional request was received on Feb. 3, 2014 for fosamax and Plavix.  Plavix was refilled to [facility] at that time.  I have spoken with [facility] rep who confirms Plavix was shipped to pt on Feb. 4, 2014.  Leilani Able attempted to contact pt on Feb. 4 to have her contact primary MD for fosamax refill but was unable to reach pt.  I have called [facility] and verbally given refill for fosamax--12 tablets with one refill.  Additional refill for this should be through primary MD.  I spoke with pt and gave her this information.

## 2012-11-25 NOTE — Telephone Encounter (Signed)
Pt called very anger,upset for "our office not refill medications". I explained the reason she only go Plavix refill. Dr Clifton James will not refill the Flomax.  Also I offered to have Pat,RN or Gina,DON to contact her if needed. Pt continue to be rude and upset with the office services we was giving her. Will pass message to Pt,RN to readdress the message to patient later today. I feel like she was so upset with information that she will not allow me to finish explaining  Information.Pt also would like samples if we have in office.

## 2012-12-15 ENCOUNTER — Encounter: Payer: Self-pay | Admitting: Cardiovascular Disease

## 2012-12-15 ENCOUNTER — Ambulatory Visit (INDEPENDENT_AMBULATORY_CARE_PROVIDER_SITE_OTHER): Payer: Medicare Other | Admitting: Cardiovascular Disease

## 2012-12-15 VITALS — BP 127/52 | HR 71 | Ht 64.0 in | Wt 164.0 lb

## 2012-12-15 DIAGNOSIS — I2510 Atherosclerotic heart disease of native coronary artery without angina pectoris: Secondary | ICD-10-CM

## 2012-12-15 NOTE — Progress Notes (Signed)
History of Present Illness: 72 yo female with history of CAD s/p previous stenting procedures, CVA, ischemic cardiomyopathy, COPD who is here today for cardiac follow up. She has been followed in the past by Dr. Juanda Chance. Her cardiac history dates back to 27. She initially had an anterior MI in 1990 treated with TPA followed by PTCA. In July 2010 she had a drug eluting stent placed in the circumflex artery in the setting of unstable angina. Cath per Dr. Juanda Chance in February 2011 with stable LAD disease, patent Circumflex stent and 50-70% mid RCA stent. Her ejection fraction was 45% and a pulmonary wedge pressure was normal. Her symptoms were felt to be secondary to her COPD. She had a stroke treated with TPA in 2009. She also has severe obstructive pulmonary disease with an FEV1 of less than 1 L. She is followed by Dr. Vassie Loll with Pulmonary. She had an episode of severe chest pain while at the beach in October 2013 and was admitted and told by Dr. Jackquline Berlin at [facility] that she had pericarditis. Two weeks later while in [place], she developed left hand weakness and could not talk. She was taken to a local hospital in [place] and was given TPA, then transferred to an ICU in [place]. She was discharged on Plavix. No deficits except a little mouth droop. She was seen here 08/31/12 and had c/o chest pains. Cardiac cath 08/31/12 with severe stenosis proximal LAD now s/p drug eluting stent proximal LAD (2.75 x 12 mm Promus Element DES post-dilated with 3.0 balloon). I last saw her 10/07/12. She was upset with our office several weeks ago because we would not refill her Fosamax which has been filled through her primary care office.   She is here today for follow up. Her chest pain has resolved. She has occasional mild pains, relieved with NTG. Breathing is the same.   Primary Care Physician: [name] Pulmonary: Dr. Vassie Loll  Last Lipid Profile: Needs updating.    Past Medical History  Diagnosis Date  .  CAD (coronary artery disease)     a. ant MI 85 tx with TPA/PTCA. b. Prior stenting procedures including RCA stent, DES to LCx 04/2011. c. [place] -> DES  to prox LAD 08/2012.   Marland Kitchen Other emphysema   . Unspecified arthropathy, shoulder region   . Gastric ulcer, unspecified as acute or chronic, without mention of hemorrhage, perforation, or obstruction   . Benign paroxysmal positional vertigo   . COPD (chronic obstructive pulmonary disease)   . Hypertension   . Diverticula of colon   . Osteopenia   . Personal history of colonic polyps 2007    tubular adenoma high grade dysplasia  . Diverticulitis   . Hyperlipemia   . Fracture of ankle, bimalleolar, right, closed   . Hypoxemia   . C. difficile diarrhea   . Varicose vein   . Asthma   . GERD (gastroesophageal reflux disease)   . Hepatitis 1989  . Arthritis     "both shoulders"   . CVA (cerebral vascular accident)   . TIA (transient ischemic attack) 04/2008; 08/21/2012    "slight droop left lower lip after 08/21/12 TIA"  . On home oxygen therapy   . Ischemic cardiomyopathy     a. EF 45-50% 2011. b. EF 35-40% by cath 09/01/12.    Past Surgical History  Procedure Laterality Date  . Total shoulder arthroplasty  2012    left  . Orif ankle fracture  2007    right  .  Martial megeti  1610'R    "bladder operation; not successful " (08/31/2012)  . Varicose vein surgery  1983; 1985    "both legs both times" (08/31/2012)  . Total shoulder replacement  12/05/2010    Dr Rennis Chris  . Abdominal hysterectomy      TAH w/BSO  . Augmentation mammaplasty  1975  . Coronary angioplasty with stent placement  2010  . Coronary angioplasty  1990    "3 times" (08/31/2012)  . Colon surgery  07/07/2011    Dr. [name]  . Appendectomy  1960    Current Outpatient Prescriptions  Medication Sig Dispense Refill  . albuterol (PROVENTIL HFA;VENTOLIN HFA) 108 (90 BASE) MCG/ACT inhaler Inhale 2 puffs into the lungs every 6 (six) hours as needed. For shortness of breath       . alendronate (FOSAMAX) 70 MG tablet Take 1 tablet (70 mg total) by mouth every 7 (seven) days. Take with a full glass of water on an empty stomach.  12 tablet  1  . aspirin EC 81 MG tablet Take 1 tablet (81 mg total) by mouth daily.      . calcium-vitamin D (OSCAL WITH D) 500-200 MG-UNIT per tablet Take 0.5 tablets by mouth 2 (two) times daily.       [name] Sodium 30-100 MG CAPS Take 1 tablet by mouth daily. For constipation      . Cholecalciferol (VITAMIN D3) 2000 UNITS TABS Take 2 tablets by mouth 2 (two) times daily.       . clopidogrel (PLAVIX) 75 MG tablet Take 1 tablet (75 mg total) by mouth daily.  90 tablet  4  . Cranberry-Olive Leaf (URINARY TRACT HEALTH) 365-50 MG CAPS Take 1 capsule by mouth daily.       . Fluticasone-Salmeterol (ADVAIR DISKUS) 250-50 MCG/DOSE AEPB Inhale 1 puff into the lungs 2 (two) times daily.  2 each  0  . Garlic (ODORLESS GARLIC) 1250 MG TABS Take 1 tablet by mouth every other day.       . imipramine (TOFRANIL) 10 MG tablet Pt take 2 tablets by mouth at bedtime.      Marland Kitchen L-Lysine 500 MG TABS Take 1 tablet by mouth every other day.       . loratadine (CLARITIN) 10 MG tablet Take 10 mg by mouth daily.      . metoprolol succinate (TOPROL-XL) 25 MG 24 hr tablet Take 1/2 tab daily      . Multiple Vitamin (MULTIVITAMIN) capsule Take 0.5 capsules by mouth 2 (two) times daily.       . nitroGLYCERIN (NITROSTAT) 0.4 MG SL tablet Place 1 tablet (0.4 mg total) under the tongue every 5 (five) minutes as needed for chest pain (up to 3 doses).  25 tablet  4  . Omega-3 Fatty Acids (FISH OIL) 1200 MG CAPS Take 1 capsule by mouth every evening.       . pantoprazole (PROTONIX) 20 MG tablet Take 1 tablet (20 mg total) by mouth daily.  30 tablet  1  . simvastatin (ZOCOR) 40 MG tablet Take 1 tablet (40 mg total) by mouth at bedtime.  90 tablet  3  . tiotropium (SPIRIVA HANDIHALER) 18 MCG inhalation capsule Place 1 capsule (18 mcg total) into inhaler and inhale  daily.  20 capsule  0  . vitamin B-12 (CYANOCOBALAMIN) 1000 MCG tablet Take 1,000 mcg by mouth daily.         No current facility-administered medications for this visit.    Allergies  Allergen Reactions  .  Escitalopram Oxalate Shortness Of Breath, Diarrhea and Nausea And Vomiting    Hot flashes, dizziness  . Indomethacin Other (See Comments)    "made me very very dizzy and made me have vertigo" (08/31/2012)  . Codeine Nausea And Vomiting    "I can take codeine in my cough medicine" (08/31/2012)  . Cephalexin Other (See Comments)    Complicated by C dif colitis  . Sulfonamide Derivatives Other (See Comments)    Drug induced hepatitis in 1989    History   Social History  . Marital Status: Single    Spouse Name: N/A    Number of Children: N/A  . Years of Education: N/A   Occupational History  . Realtor [employer]   Social History Main Topics  . Smoking status: Former Smoker -- 1.00 packs/day for 40 years    Types: Cigarettes    Quit date: 10/20/1997  . Smokeless tobacco: Never Used  . Alcohol Use: 1.8 oz/week    3 Glasses of wine per week     Comment: occasionally  . Drug Use: No  . Sexually Active: Not Currently   Other Topics Concern  . Not on file   Social History Narrative  . No narrative on file    Family History  Problem Relation Age of Onset  . Breast cancer Sister     x 2  . Colon cancer Neg Hx   . Diabetes Paternal Grandmother   . Uterine cancer Paternal Grandmother   . Lung cancer Paternal Grandmother   . Heart disease Mother   . Heart disease Father   . Pancreatic cancer Cousin   . Colon polyps Sister   . Diabetes Maternal Uncle   . Heart disease Maternal Grandfather   . Heart disease Paternal Grandfather   . Stroke Paternal Grandfather     Review of Systems:  As stated in the HPI and otherwise negative.   BP 127/52  Pulse 71  Ht 5\' 4"  (1.626 m)  Wt 164 lb (74.39 kg)  BMI 28.14 kg/m2  Physical Examination: General: Well developed,  well nourished, NAD HEENT: OP clear, mucus membranes moist SKIN: warm, dry. No rashes. Neuro: No focal deficits Musculoskeletal: Muscle strength 5/5 all ext Psychiatric: Mood and affect normal Neck: No JVD, no carotid bruits, no thyromegaly, no lymphadenopathy. Lungs:Clear bilaterally, no wheezes, rhonci, crackles Cardiovascular: Regular rate and rhythm. No murmurs, gallops or rubs. Abdomen:Soft. Bowel sounds present. Non-tender.  Extremities: No lower extremity edema. Pulses are 2 + in the bilateral DP/PT.   Assessment and Plan:   1. CAD: Stable. Recent DES in LAD. I do not think her dyspnea is related to her CAD. Continue ASA, Plavix, statin, beta blocker.   2. Dyspnea: Stable. She has chronic systolic CHF with LVEF of 35-40%. Volume status is ok. Likely related to her COPD.   3. Ischemic cardiomyopathy: LVEF around 40% by all recent tests including LVGram, echo at [facility] in [facility] November 2013. Continue medical therapy.

## 2012-12-15 NOTE — Patient Instructions (Addendum)
Your physician wants you to follow-up in:  6 months. You will receive a reminder letter in the mail two months in advance. If you don't receive a letter, please call our office to schedule the follow-up appointment.   

## 2012-12-17 ENCOUNTER — Ambulatory Visit: Payer: Medicare Other | Admitting: Cardiovascular Disease

## 2012-12-17 ENCOUNTER — Telehealth: Payer: Self-pay | Admitting: Pulmonary Disease

## 2012-12-17 MED ORDER — BENZONATATE 200 MG PO CAPS: 200.0000 mg | ORAL_CAPSULE | Freq: Three times a day (TID) | ORAL | Status: DC | PRN

## 2012-12-17 NOTE — Telephone Encounter (Signed)
I spoke with pt and she c/o dry cough at night x October. She is requesting tessalon pearls bc she states this has helped in the past. If okay please okay strength, quantity, and # of refills. Also she stated she had a cxr 10/07/12 and is wanting Dr. Vassie Loll to look at this and see what he thinks. She is just concerned bc her cough is not getting better. Please advise RA thanks.  Allergies  Allergen Reactions  . Escitalopram Oxalate Shortness Of Breath, Diarrhea and Nausea And Vomiting    Hot flashes, dizziness  . Indomethacin Other (See Comments)    "made me very very dizzy and made me have vertigo" (08/31/2012)  . Codeine Nausea And Vomiting    "I can take codeine in my cough medicine" (08/31/2012)  . Cephalexin Other (See Comments)    Complicated by C dif colitis  . Sulfonamide Derivatives Other (See Comments)    Drug induced hepatitis in 1989

## 2012-12-17 NOTE — Telephone Encounter (Signed)
CXR in dec '13 was ok Tessalon 200 tid prn

## 2012-12-17 NOTE — Telephone Encounter (Signed)
I spoke with pt and is aware of RA response . Nothing further was needed

## 2013-01-01 ENCOUNTER — Telehealth: Payer: Self-pay | Admitting: Pulmonary Disease

## 2013-01-01 NOTE — Telephone Encounter (Signed)
Pt called with c/o cough.  Same as telephone call in Feb 2014, and Dr. Vassie Loll called in tessalon pearls.  She did not fill because too expensive.  Now she finds out can get 90 day supply for 14 dollars, and wishes me to call this in.  I am uncomfortable sending in a 90 day supply of a cough medication, and offered to send in a few weeks until she could speak with Dr. Vassie Loll.  The pt preferred to wait and will call the office next week.

## 2013-01-10 ENCOUNTER — Telehealth: Payer: Self-pay | Admitting: *Deleted

## 2013-01-10 NOTE — Telephone Encounter (Signed)
Sonda Primes from [facility] called stating that pt has been in an insulin resistance study since 2009 & they need to know if the pt has at any point been diagnosed with CHF. Gala Romney states that the pt is on a drug & if she has been diagnosed with CHF then she needs to come off the drug immediately. Gala Romney is requesting a call back from Dr. [name] if possible. Please advise.

## 2013-01-17 ENCOUNTER — Telehealth: Payer: Self-pay | Admitting: Pulmonary Disease

## 2013-01-17 NOTE — Telephone Encounter (Signed)
OK to do so

## 2013-01-17 NOTE — Telephone Encounter (Signed)
I spoke with pt. She stated the benzonatate at [facility] was going to cost her $100. She called [facility] and for 90 day supply it would be $14. She is asking for rx to be sent there. Please advise Dr. Vassie Loll thanks

## 2013-01-18 MED ORDER — BENZONATATE 200 MG PO CAPS: 200.0000 mg | ORAL_CAPSULE | Freq: Three times a day (TID) | ORAL | Status: DC | PRN

## 2013-01-18 NOTE — Telephone Encounter (Signed)
Rx has been sent in to [facility], patient aware and nothing further needed at this time.

## 2013-01-18 NOTE — Telephone Encounter (Signed)
PT informed that refill for Tessalon #90 day supply was sent to [facility]

## 2013-01-24 ENCOUNTER — Telehealth: Payer: Self-pay | Admitting: Pulmonary Disease

## 2013-01-24 ENCOUNTER — Telehealth: Payer: Self-pay | Admitting: Cardiovascular Disease

## 2013-01-24 NOTE — Telephone Encounter (Signed)
Spoke with pt She is c/o having bronchitis- prod cough with yellow sputum, chest tightness and increased SOB I offered ov with TP today and she refused, stating that she did not look good enough to come in I have scheduled her to see TP tomorrow at 3 pm and advised to seek emergent care sooner if needed

## 2013-01-24 NOTE — Telephone Encounter (Signed)
Left message for call back.

## 2013-01-24 NOTE — Telephone Encounter (Signed)
New problem    Discuss patient study drug.

## 2013-01-25 ENCOUNTER — Ambulatory Visit (INDEPENDENT_AMBULATORY_CARE_PROVIDER_SITE_OTHER): Payer: Medicare Other | Admitting: Adult Health

## 2013-01-25 ENCOUNTER — Encounter: Payer: Self-pay | Admitting: Adult Health

## 2013-01-25 VITALS — BP 112/74 | HR 99 | Temp 98.3°F | Ht 64.5 in | Wt 162.4 lb

## 2013-01-25 DIAGNOSIS — J449 Chronic obstructive pulmonary disease, unspecified: Secondary | ICD-10-CM

## 2013-01-25 MED ORDER — HYDROCODONE-HOMATROPINE 5-1.5 MG/5ML PO SYRP: 5.0000 mL | ORAL_SOLUTION | Freq: Four times a day (QID) | ORAL | Status: DC | PRN

## 2013-01-25 MED ORDER — LEVALBUTEROL HCL 0.63 MG/3ML IN NEBU
0.6300 mg | INHALATION_SOLUTION | Freq: Once | RESPIRATORY_TRACT | Status: AC
  Administered 2013-01-25: 0.63 mg via RESPIRATORY_TRACT

## 2013-01-25 MED ORDER — AMOXICILLIN-POT CLAVULANATE 875-125 MG PO TABS: 1.0000 | ORAL_TABLET | Freq: Two times a day (BID) | ORAL | Status: AC

## 2013-01-25 MED ORDER — PREDNISONE 10 MG PO TABS: ORAL_TABLET | ORAL | Status: DC

## 2013-01-25 NOTE — Telephone Encounter (Signed)
Spoke with Gala Romney at [facility]. He has received requested note from 05/21/10 that I faxed to him on 4/4 which is the first documented notation of CHF that I was able to find in patient's record. Per Gala Romney pt's case was discussed with Research coordinators at [place] on 01/24/13 and they would like to continue study drug if OK with Dr. Clifton James. Gala Romney is requesting to discuss this with Dr. Clifton James. I told him I would forward message to Dr. Clifton James to contact him to review.

## 2013-01-25 NOTE — Progress Notes (Deleted)
  Subjective:    Patient ID: [name], female    DOB: [birth year], 72 y.o.   MRN: [id number]  HPI    Review of Systems     Objective:   Physical Exam        Assessment & Plan:

## 2013-01-25 NOTE — Patient Instructions (Addendum)
Stop oil based products until cough is better.  Hold Fosamax for 1 month .  Delsym 2 tsp Twice daily  For cough Tessalon Three times a day  As needed  Cough  Hydromet 1 tsp every 6 hr As needed  Cough , may make you sleepy  Augmentin 875 mg Twice daily  For 7 days -take w/ food.  Prednisone taper over next week.  Add Pepcid 20mg  At bedtime   Chlorphenaramine 4mg  2 At bedtime  For drainage. As needed   Please contact office for sooner follow up if symptoms do not improve or worsen or seek emergency care  follow up Dr. Vassie Loll  In 6 weeks and As needed

## 2013-01-25 NOTE — Addendum Note (Signed)
Addended by: Boone Master E on: 01/25/2013 04:20 PM   Modules accepted: Orders

## 2013-01-25 NOTE — Telephone Encounter (Signed)
Follow-up:    Called in returning Jackie's call.  Please call back.

## 2013-01-25 NOTE — Assessment & Plan Note (Signed)
Exacerbation w/ severe cough  tx possible triggers  Check xray  xopenex neb in office   Plan  Stop oil based products until cough is better.  Hold Fosamax for 1 month .  Delsym 2 tsp Twice daily  For cough Tessalon Three times a day  As needed  Cough  Hydromet 1 tsp every 6 hr As needed  Cough , may make you sleepy  Augmentin 875 mg Twice daily  For 7 days -take w/ food.  Prednisone taper over next week.  Add Pepcid 20mg  At bedtime   Chlorphenaramine 4mg  2 At bedtime  For drainage. As needed   Please contact office for sooner follow up if symptoms do not improve or worsen or seek emergency care  follow up Dr. Vassie Loll  In 6 weeks and As needed

## 2013-01-25 NOTE — Progress Notes (Signed)
Subjective:    Patient ID: [name], female    DOB: 1941/02/14, 72 y.o.   MRN: [id number]  HPI  PCP - [name]  71/F, ex smoker, realtor with Gold C COPD FEV1 1.0 L (48%), on nocturnal O2 since 10/10 for FU.  She smoked for 40 years and quit in 1999. Advair worked better than symbicort.     Sick visit 6/11 chest cold - cefdinir helped ! (avelox too expensive) Completed course of steroids.  ER visit in dec '11 - received steroids x 4 ds   7/12 >>She is interested in research study -did not qualify due to poor lung function.  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    11/22/12  she has had a very rough 3 months - had a stent placed and a stroke. Cardiac cath 08/31/12 with severe stenosis proximal LAD now s/p drug eluting stent proximal LAD  1/14 zpak pt c/o increase SOB w/ exertion. She has to rest for 10 minutes after doing something to catch her breathe. Pt uses oxygen at bedtime. c/o wheezing. denies any chest tx Reviewed records from [facility] where she went for a second opinion  - rehab &  Theophylline advised Remains reluctant to enroll in pulm rehab compliant with noct O2 Satn dropped to 90 % on walking 3 laps >>change metoprolol to bystolic   01/25/2013 Acute OV  Complains of prod cough with white mucus, head congestion with yellow mucus, PND, wheezing, increased SOB x5days - denies tightness, f/c/s Cough so hard she could vomit. Kimberlee Nearing is not working.  Wants all generics rx -can not afford meds.  Had recent face lift -non invasive . No sedation used.  No fever , chest pain or edema. No orthopnea.     Review of Systems Constitutional:   No  weight loss, night  sweats,  Fevers, chills, +fatigue, or  lassitude.  HEENT:   No headaches,  Difficulty swallowing,  Tooth/dental problems, or  Sore throat,                No sneezing, itching, ear ache,  +nasal congestion, post nasal drip,   CV:  No chest pain,  Orthopnea, PND, swelling in lower extremities, anasarca, dizziness, palpitations, syncope.   GI  No heartburn, indigestion, abdominal pain, nausea, vomiting, diarrhea, change in bowel habits, loss of appetite, bloody stools.   Resp:    No chest wall deformity  Skin: no rash or lesions.  GU: no dysuria, change in color of urine, no urgency or frequency.  No flank pain, no hematuria   MS:  No joint pain or swelling.  No decreased range of motion.  No back pain.  Psych:  No change in mood or affect. No depression or anxiety.  No memory loss.      Objective:   Physical Exam    Gen. Pleasant, well-nourished, in no distress, normal affect ENT - no lesions, + post nasal drip, continuous cough  Neck: No JVD, no thyromegaly, no carotid bruits Lungs: no use of accessory muscles, no dullness to percussion, clear without rales or rhonchi  Cardiovascular: Rhythm regular, heart sounds  normal, no murmurs or gallops, no peripheral edema Abdomen: soft and non-tender, no  hepatosplenomegaly, BS normal. Musculoskeletal: No deformities, no cyanosis or clubbing Neuro:  alert, non focal        Assessment & Plan:

## 2013-01-26 NOTE — Telephone Encounter (Signed)
I spoke to Ranger today. cdm

## 2013-02-14 ENCOUNTER — Other Ambulatory Visit: Payer: Self-pay

## 2013-02-14 DIAGNOSIS — Z9882 Breast implant status: Secondary | ICD-10-CM

## 2013-02-14 DIAGNOSIS — Z1231 Encounter for screening mammogram for malignant neoplasm of breast: Secondary | ICD-10-CM

## 2013-03-22 ENCOUNTER — Telehealth: Payer: Self-pay | Admitting: Neurology

## 2013-03-22 ENCOUNTER — Ambulatory Visit
Admission: RE | Admit: 2013-03-22 | Discharge: 2013-03-22 | Disposition: A | Discharge status: Home / Self Care | Payer: Medicare Other | Source: Ambulatory Visit

## 2013-03-22 ENCOUNTER — Other Ambulatory Visit: Payer: Self-pay | Admitting: *Deleted

## 2013-03-22 DIAGNOSIS — Z9882 Breast implant status: Secondary | ICD-10-CM

## 2013-03-22 DIAGNOSIS — Z1231 Encounter for screening mammogram for malignant neoplasm of breast: Secondary | ICD-10-CM

## 2013-03-23 ENCOUNTER — Telehealth: Payer: Self-pay | Admitting: Neurology

## 2013-03-23 ENCOUNTER — Other Ambulatory Visit: Payer: Self-pay | Admitting: Pulmonary Disease

## 2013-03-24 ENCOUNTER — Other Ambulatory Visit: Payer: Self-pay | Admitting: Internal Medicine

## 2013-03-24 DIAGNOSIS — R928 Other abnormal and inconclusive findings on diagnostic imaging of breast: Secondary | ICD-10-CM

## 2013-04-06 ENCOUNTER — Ambulatory Visit (INDEPENDENT_AMBULATORY_CARE_PROVIDER_SITE_OTHER): Payer: Medicare Other | Admitting: Neurology

## 2013-04-06 ENCOUNTER — Encounter: Payer: Self-pay | Admitting: Neurology

## 2013-04-06 VITALS — BP 123/70 | HR 80 | Ht 64.0 in | Wt 163.0 lb

## 2013-04-06 DIAGNOSIS — G478 Other sleep disorders: Secondary | ICD-10-CM

## 2013-04-06 DIAGNOSIS — I639 Cerebral infarction, unspecified: Secondary | ICD-10-CM | POA: Insufficient documentation

## 2013-04-06 DIAGNOSIS — F514 Sleep terrors [night terrors]: Secondary | ICD-10-CM | POA: Insufficient documentation

## 2013-04-06 MED ORDER — GABAPENTIN 300 MG PO CAPS: 300.0000 mg | ORAL_CAPSULE | Freq: Every evening | ORAL | Status: DC

## 2013-04-06 MED ORDER — CLONAZEPAM 0.25 MG PO TBDP: 0.2500 mg | ORAL_TABLET | Freq: Two times a day (BID) | ORAL | Status: DC | PRN

## 2013-04-06 NOTE — Progress Notes (Signed)
[facility]  Provider:  Dr Mintie Witherington Referring Provider: Pecola Lawless, MD Primary Care Physician:  Delia Heady, MD   Chief Complaint  Patient presents with  . Follow-up    per patient request  rm10    HPI:  [name] is a 72 y.o. female here  For  Dr. 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 The sleep study showed only that she was a snorer but not significant number of apneas. Severe and sustained oxygen desaturations were noted and the patient was prescribed 2 L of oxygen by Dr. in October 2013 Dr. Andrey Campanile refer the patient again history now coronary artery disease and myocardial infarction, she underwent 3 angioplasties and stent placements. And Dr. that is referral included for note of a TPA treatment for right posterior several artery infarct in 2009.  The patient again presented with night terrors adult H. onset and snoring the study was not done with the 2 L of oxygen replaced. Epworth sleepiness score was 12 points BMI 27 patient's neck circumference 14 inches. Blood pressure was low or 116/78. There was again no evidence of sleep apnea but frequent picograms was essentially normal CPAP and architecture. Video and audio recorded for the first time at 2 spells  of parasomnia:  the patient was sleep talking and moaning, this was seen doing non-REM sleep N 2.  Given that the patient was already on oxygen supplementation I felt it was safe for her to try treatment by medication.  The patient then suffered another stroke in November 2013 but she was and Tia Masker and was [facility]. Again received t-PA she states. She is also enrolled for the last 5 years the stroke study with Dr. Pearlean Brownie  in  the research department of [facility]. The patient was placed on imipramine 10 mg at night to take 2 tablets at night, this begun in 11/02/2012.The patient reports that her night terrors, screaming and yelling has not been affected by this medication. I would now like to try Klonopin and Seroquel.   Mrs. Steward also reports having insomnia or at least very irregular sleep cycles. Her regular bedtime is at midnight and she not only rises at 9 AM. She estimates her all fall sleep time at night at at only 6 or 7 hours. She has a lot of nocturia breaks at night interrupting her sleep. She does not report interruption of sleep by pain or discomfort as much as by the bathroom needs.  Patient lives alone , there  is no witness to her sleep habits but she reports that friends and family members have been very concerned when witnessing ,  hearing her scream at night.    Review of Systems: Out of a complete 14 system review, the patient complains of only the following symptoms, and all other reviewed systems are negative.  night terror   History   Social History  . Marital Status: Single    Spouse Name: N/A    Number of Children: 3  . Years of Education: N/A   Occupational History  . 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    Marland Kitchen Retired    Social History Main Topics  . Smoking status: Former Smoker -- 1.00  packs/day for 40 years    Types: Cigarettes    Quit date: 10/19/1997  . Smokeless tobacco: Never Used  . Alcohol Use: 1.8 oz/week    3 Glasses of wine per week     Comment: occasionally  . Drug Use: No  . Sexually Active: Not Currently   Other Topics Concern  . Not on file   Social History Narrative   Patient is single and lives at home alone.   Patient is retired.   Patient has some college .   Patient has 3 children.     Family History  Problem Relation Age of Onset  . Breast cancer Sister     x 2  . Colon cancer Neg Hx   . Diabetes Paternal Grandmother   . Uterine  cancer Paternal Grandmother   . Lung cancer Paternal Grandmother   . Heart disease Mother   . Heart disease Father   . Pancreatic cancer Cousin   . Colon polyps Sister   . Diabetes Maternal Uncle   . Heart disease Maternal Grandfather   . Heart disease Paternal Grandfather   . Stroke Paternal Grandfather     Past Medical History  Diagnosis Date  . CAD (coronary artery disease)     a. ant MI 31 tx with TPA/PTCA. b. Prior stenting procedures including RCA stent, DES to LCx 04/2011. c. [place] -> DES  to prox LAD 08/2012.   Marland Kitchen Other emphysema   . Unspecified arthropathy, shoulder region   . Gastric ulcer, unspecified as acute or chronic, without mention of hemorrhage, perforation, or obstruction   . Benign paroxysmal positional vertigo   . COPD (chronic obstructive pulmonary disease)   . Hypertension   . Diverticula of colon   . Osteopenia   . Personal history of colonic polyps 2007    tubular adenoma high grade dysplasia  . Diverticulitis   . Hyperlipemia   . Fracture of ankle, bimalleolar, right, closed   . Hypoxemia   . C. difficile diarrhea   . Varicose vein   . Asthma   . GERD (gastroesophageal reflux disease)   . Hepatitis 1989  . Arthritis     "both shoulders"   . CVA (cerebral vascular accident)   . TIA (transient ischemic attack) 04/2008; 08/21/2012    "slight droop left lower lip after 08/21/12 TIA"  . On home oxygen therapy   . Ischemic cardiomyopathy     a. EF 45-50% 2011. b. EF 35-40% by cath 09/01/12.  . Night terrors, adult     dr Pearlean Brownie referred  for PSG in 2011, and again in 2013   . Stroke, acute, embolic     Nov 2013 , [place]  [facility] , was treated with TPA.     Past Surgical History  Procedure Laterality Date  . Total shoulder arthroplasty  2012    left  . Orif ankle fracture  2007    right  . Martial megeti  1610'R    "bladder operation; not successful " (08/31/2012)  . Varicose vein surgery  1983; 1985    "both legs both times" (08/31/2012)  .  Total shoulder replacement  12/05/2010    Dr Rennis Chris  . Abdominal hysterectomy      TAH w/BSO  . Augmentation mammaplasty  1975  . Coronary angioplasty with stent placement  2010  . Coronary angioplasty  1990    "3 times" (08/31/2012)  . Colon surgery  07/07/2011    Dr. [name]  . Appendectomy  1960  Current Outpatient Prescriptions  Medication Sig Dispense Refill  . albuterol (PROVENTIL HFA;VENTOLIN HFA) 108 (90 BASE) MCG/ACT inhaler Inhale 2 puffs into the lungs every 6 (six) hours as needed. For shortness of breath      . alendronate (FOSAMAX) 70 MG tablet Take 1 tablet (70 mg total) by mouth every 7 (seven) days. Take with a full glass of water on an empty stomach.  12 tablet  1  . aspirin EC 81 MG tablet Take 1 tablet (81 mg total) by mouth daily.      Marland Kitchen Avocado Oil OIL daily.      . benzonatate (TESSALON) 200 MG capsule Take 1 capsule (200 mg total) by mouth 3 (three) times daily as needed for cough.  270 capsule  0  . calcium-vitamin D (OSCAL WITH D) 500-200 MG-UNIT per tablet Take 0.5 tablets by mouth 2 (two) times daily.       [name] Sodium 30-100 MG CAPS Take 1 tablet by mouth daily. For constipation      . Cholecalciferol (VITAMIN D3) 2000 UNITS TABS Take 2 tablets by mouth 2 (two) times daily.       . clopidogrel (PLAVIX) 75 MG tablet Take 1 tablet (75 mg total) by mouth daily.  90 tablet  4  . Cranberry-Olive Leaf (URINARY TRACT HEALTH) 365-50 MG CAPS Take 1 capsule by mouth daily.       . Fluticasone-Salmeterol (ADVAIR DISKUS) 250-50 MCG/DOSE AEPB Inhale 1 puff into the lungs 2 (two) times daily.  2 each  0  . Garlic (ODORLESS GARLIC) 1250 MG TABS Take 1 tablet by mouth every other day.       . imipramine (TOFRANIL) 10 MG tablet Pt take 2 tablets by mouth at bedtime.      Marland Kitchen L-Lysine 500 MG TABS Take 1 tablet by mouth every other day.       . loratadine (CLARITIN) 10 MG tablet Take 10 mg by mouth daily.      . metoprolol succinate (TOPROL-XL) 25 MG 24 hr  tablet Take 1/2 tab daily      . Multiple Vitamin (MULTIVITAMIN) capsule Take 0.5 capsules by mouth 2 (two) times daily.       . nitroGLYCERIN (NITROSTAT) 0.4 MG SL tablet Place 1 tablet (0.4 mg total) under the tongue every 5 (five) minutes as needed for chest pain (up to 3 doses).  25 tablet  4  . Omega-3 Fatty Acids (FISH OIL) 1200 MG CAPS Take 1 capsule by mouth every evening.       . simvastatin (ZOCOR) 40 MG tablet Take 1 tablet (40 mg total) by mouth at bedtime.  90 tablet  3  . tiotropium (SPIRIVA HANDIHALER) 18 MCG inhalation capsule Place 1 capsule (18 mcg total) into inhaler and inhale daily.  20 capsule  0  . vitamin B-12 (CYANOCOBALAMIN) 1000 MCG tablet Take 1,000 mcg by mouth daily.         No current facility-administered medications for this visit.    Allergies as of 04/06/2013 - Review Complete 04/06/2013  Allergen Reaction Noted  . Escitalopram oxalate Shortness Of Breath, Diarrhea, and Nausea And Vomiting 01/20/2011  . Indomethacin Other (See Comments) 08/31/2012  . Codeine Nausea And Vomiting   . Cephalexin Other (See Comments) 06/13/2011  . Sulfonamide derivatives Other (See Comments)     Vitals: BP 123/70  Pulse 80  Ht 5\' 4"  (1.626 m)  Wt 163 lb (73.936 kg)  BMI 27.97 kg/m2 Last Weight:  Wt Readings from Last  1 Encounters:  04/06/13 163 lb (73.936 kg)   Last Height:   Ht Readings from Last 1 Encounters:  04/06/13 5\' 4"  (1.626 m)    Physical exam:  General: The patient is awake, alert and appears not in acute distress. The patient is well groomed. Head: Normocephalic, atraumatic. Neck is supple. Mallampait 2 , neck circumference: 14 inches. No retrognathia, dentures . Cardiovascular:  Regular rate and rhythm, without  murmurs or carotid bruit, and without distended neck veins. Respiratory: high RR, shortness of breath , COPD documented. 0xygen therapy related to COPD.  Skin:  Without evidence of edema, or rash Trunk: BMI is elevated and patient  has  normal posture.  Neurologic exam : The patient is awake and alert, oriented to place and time.  Memory subjective  described as intact. There is a normal attention span & concentration ability. Speech is fluent without  dysarthria, dysphonia or aphasia. Mood and affect are appropriate.  Cranial nerves: Pupils are equal and briskly reactive to light.  Extraocular movements  in vertical and horizontal planes intact and without nystagmus. Visual fields by finger perimetry are intact. Hearing to finger rub intact.  Facial sensation intact to fine touch. Facial motor strength is symmetric and tongue and uvula move midline.  Motor exam:   Normal tone and normal muscle bulk and symmetric normal strength in all extremities.  Sensory:  Fine touch, pinprick and vibration were tested in all extremities.  Coordination: Rapid alternating movements in the fingers/hands is tested and normal. Finger-to-nose maneuver tested and normal without evidence of ataxia, dysmetria or tremor.  Gait and station: Patient walks without assistive device . Strength within normal limits. Stance is stable and normal. Tandem gait is unfragmented. Deep tendon reflexes: in the  upper and lower extremities are symmetric and intact.    Assessment:  After physical and neurologic examination, review of laboratory studies, imaging, neurophysiology testing and pre-existing records, assessment will be reviewed on the problem list. The patient suffers from COPD as well as having had multiple strokes in the past. It was her first stroke that was documented that the night terrors followed. I do wonder if this is a frontal lobe Stroke  with a  nocturnal seizure-like disorder.  I will place the patient on 2 medications, affect her  Cerebro- vascular health. I will ask her to discontinue the imipramine. I will need dr Vassie Loll to follow her COPD and recheck this patient for tolerance of Klonipin.    #1 we will start Neurontin 300 mg at night  only,  #2 Klonopin 0.5 mg at night only    Plan:  Treatment plan and additional workup will be reviewed under Problem List.

## 2013-04-06 NOTE — Patient Instructions (Signed)
Stroke (Cerebrovascular Accident) A stroke (cerebrovascular accident, CVA) means you have a brain injury from blocked circulation or bleeding in the brain. Blocked circulation usually comes from a clot. RISK FACTORS  High blood pressure (hypertension).   High cholesterol.   Diabetes.   Heart disease.   The buildup of fatty deposits in the blood vessels (peripheral artery disease or atherosclerosis).   An abnormal heart rhythm (atrial fibrillation).   Obesity.   Smoking.   Taking oral contraceptives (especially in combination with smoking).   Physical inactivity.   A diet high in fats, salt (sodium), and calories.   Alcohol use.   Use of illegal drugs (especially cocaine and methamphetamine).   Being a female.   Being an African American.   Age over 55.   Family history of stroke.   Previous history of blood clots, a "warning stroke" (transient ischemic attack, TIA), or heart attack.   Sickle cell disease.  SYMPTOMS  The symptoms of a stroke depend on the part of the brain that is affected. It is important to seek treatment within 4 hours of the start of symptoms because you may receive a "clot dissolving" medication that cannot be given after that time. Even if you don't know when your symptoms began, get treatment as soon as possible. Symptoms of a stroke may progress or change over the first several days. Symptoms may include:  Sudden weakness or numbness of the face, arm, or leg, especially on one side of the body.   Sudden confusion.   Trouble speaking (aphasia) or understanding.   Sudden trouble seeing in one or both eyes.   Sudden trouble walking.   Dizziness.   Loss of balance or coordination.   Sudden severe headache with no known cause.  HOME CARE INSTRUCTIONS   Medicines: Aspirin and blood thinners may be used to prevent another stroke. Blood thinners need to be used exactly as instructed. Medicines may also be used to control risk factors for a  stroke. Be sure you understand all your medicine instructions.   Diet: Certain diets may be prescribed to address high blood pressure, high cholesterol, diabetes, or obesity. A diet that includes 5 or more servings of fruits and vegetables a day may reduce the risk of stroke. Foods may need to be a special consistency (soft or pureed), or small bites may need to be taken in order to avoid aspirating or choking.   Maintain a healthy weight.   Stay physically active. It is recommended that you get at least 30 minutes of activity on most or all days.   Do not smoke.   Limit alcohol use.   Stop drug abuse.   Home safety: A safe home environment is important to reduce the risk of falls. Your caregiver may arrange for specialists to evaluate your home. Having grab bars in the bedroom and bathroom is often important. Your caregiver may arrange for special equipment to be used at home, such as raised toilets and a seat for the shower.   Physical, occupational, and speech therapy: Ongoing therapy may be needed to maximize your recovery after a stroke. If you have been advised to use a walker or a cane, use it at all times. Be sure to keep your therapy appointments.   Follow all instructions for follow-up with your caregiver. This is VERY important. This includes any referrals, physical therapy, rehabilitation, and laboratory tests. Proper treatment also prevents another stroke from occurring.  SEEK IMMEDIATE MEDICAL CARE IF:   You have   sudden weakness or numbness of the face, arm, or leg, especially on one side of the body.   You have sudden confusion.   You have trouble speaking (aphasia) or understanding.   You have sudden trouble seeing in one or both eyes.   You have sudden trouble walking.   You have dizziness.   You have loss of balance or coordination.   You have a sudden, severe headache with no known cause.   You have a fever.   You are coughing or have difficulty breathing.    You have new chest pain, angina, or an irregular heartbeat.  Any of these symptoms may represent a serious problem that is an emergency. Do not wait to see if the symptoms will go away. Get medical help at once. Call your local emergency services (911 in [place] Do not drive yourself to the hospital. Document Released: 10/06/2005 Document Revised: 04/21/2011 Document Reviewed: 03/06/2010 Exit[employer] Patient Information 2012 [employer]. 

## 2013-04-07 ENCOUNTER — Other Ambulatory Visit: Payer: Self-pay | Admitting: Internal Medicine

## 2013-04-07 ENCOUNTER — Ambulatory Visit
Admission: RE | Admit: 2013-04-07 | Discharge: 2013-04-07 | Disposition: A | Discharge status: Home / Self Care | Payer: Medicare Other | Source: Ambulatory Visit | Attending: Internal Medicine | Admitting: Internal Medicine

## 2013-04-07 DIAGNOSIS — R928 Other abnormal and inconclusive findings on diagnostic imaging of breast: Secondary | ICD-10-CM

## 2013-04-08 ENCOUNTER — Ambulatory Visit
Admission: RE | Admit: 2013-04-08 | Discharge: 2013-04-08 | Disposition: A | Discharge status: Home / Self Care | Payer: Medicare Other | Source: Ambulatory Visit | Attending: Internal Medicine | Admitting: Internal Medicine

## 2013-04-08 ENCOUNTER — Other Ambulatory Visit: Payer: Self-pay | Admitting: Internal Medicine

## 2013-04-08 DIAGNOSIS — N6320 Unspecified lump in the left breast, unspecified quadrant: Secondary | ICD-10-CM

## 2013-04-08 DIAGNOSIS — R928 Other abnormal and inconclusive findings on diagnostic imaging of breast: Secondary | ICD-10-CM

## 2013-04-11 ENCOUNTER — Encounter: Payer: Self-pay | Admitting: Internal Medicine

## 2013-04-11 ENCOUNTER — Other Ambulatory Visit: Payer: Self-pay | Admitting: Internal Medicine

## 2013-04-11 ENCOUNTER — Ambulatory Visit
Admission: RE | Admit: 2013-04-11 | Discharge: 2013-04-11 | Disposition: A | Discharge status: Home / Self Care | Payer: Medicare Other | Source: Ambulatory Visit | Attending: Internal Medicine | Admitting: Internal Medicine

## 2013-04-11 DIAGNOSIS — N6320 Unspecified lump in the left breast, unspecified quadrant: Secondary | ICD-10-CM

## 2013-04-11 DIAGNOSIS — C50912 Malignant neoplasm of unspecified site of left female breast: Secondary | ICD-10-CM

## 2013-04-12 ENCOUNTER — Other Ambulatory Visit: Payer: Self-pay | Admitting: *Deleted

## 2013-04-12 ENCOUNTER — Telehealth: Payer: Self-pay | Admitting: *Deleted

## 2013-04-12 DIAGNOSIS — C50312 Malignant neoplasm of lower-inner quadrant of left female breast: Secondary | ICD-10-CM | POA: Insufficient documentation

## 2013-04-12 NOTE — Telephone Encounter (Signed)
Called and confirmed [facility] appt. For 04/20/13 at 12N.  Instructions and contact information given.  No questions or concerns at this time.

## 2013-04-14 ENCOUNTER — Other Ambulatory Visit: Payer: Self-pay | Admitting: Internal Medicine

## 2013-04-14 ENCOUNTER — Telehealth: Payer: Self-pay | Admitting: Internal Medicine

## 2013-04-14 DIAGNOSIS — C50912 Malignant neoplasm of unspecified site of left female breast: Secondary | ICD-10-CM | POA: Insufficient documentation

## 2013-04-14 HISTORY — DX: Malignant neoplasm of unspecified site of left female breast: C50.912

## 2013-04-14 NOTE — Telephone Encounter (Signed)
Patient is calling asking that Dr. [name] help her make an appointment with Dr. Cyndia Bent with [facility] [phone] and also oncologist Dr. Marikay Alar Magrinat with [facility]. She has cancer and feels that if our office refers and calls these offices to make an appointment she would be able to be seen quicker.

## 2013-04-15 ENCOUNTER — Telehealth: Payer: Self-pay | Admitting: Neurology

## 2013-04-15 NOTE — Telephone Encounter (Signed)
Nobody picks up- Dr Ronnald Ramp patient , stroke and night terrors.  Clonazepam is generic KLONIPIN and is cheap.

## 2013-04-15 NOTE — Telephone Encounter (Signed)
Called patient to get more information she states she filled her gabapentin but could not fill her clonazepam because it was 209$. The patient wants to know can you please prescribed her a cheaper medication states if she has to pay for it she will but due to her recent diagnoses of breast cancer she know she will more expenses. Dr. Vickey Huger she was last seen by you on 04-06-2013 for night terrors  please advise.

## 2013-04-15 NOTE — Telephone Encounter (Signed)
Clonazepam should be 20 USD  In generic form at [facility]. Please call her back.

## 2013-04-17 ENCOUNTER — Ambulatory Visit
Admission: RE | Admit: 2013-04-17 | Discharge: 2013-04-17 | Disposition: A | Discharge status: Home / Self Care | Payer: Medicare Other | Source: Ambulatory Visit | Attending: Internal Medicine | Admitting: Internal Medicine

## 2013-04-17 DIAGNOSIS — C50912 Malignant neoplasm of unspecified site of left female breast: Secondary | ICD-10-CM

## 2013-04-17 MED ORDER — GADOBENATE DIMEGLUMINE 529 MG/ML IV SOLN
15.0000 mL | Freq: Once | INTRAVENOUS | Status: AC | PRN
  Administered 2013-04-17: 15 mL via INTRAVENOUS

## 2013-04-18 ENCOUNTER — Telehealth: Payer: Self-pay | Admitting: Neurology

## 2013-04-18 NOTE — Telephone Encounter (Signed)
Clonazepam is $209.00. She has to have medication that is less expensive. She must have something for the night terrors.

## 2013-04-18 NOTE — Telephone Encounter (Signed)
[facility] has the least expensive med for 90 day supply. This equals $208.82 for 90 day supply taking two times a day, per patient. Ms. [name], please review medication list and see if you can assist her with reduced cost medications or coupons or whatever you may have available. Also, please review rationale for use of gabapentin and other meds. for night terrors. Please review and call.

## 2013-04-18 NOTE — Telephone Encounter (Signed)
Unfortunately there is not a patient assistance or a discount card for this generic medication.  If Rx can be changed to 0.5mg  tablets, patient can cut tabs in half to make 0.25mg  dose.  0.25mg  is only available in [place], which is why it costs so much.  I checked with pharmacies, and the least expensive for Clonazepam 0.5mg  #90 was [facility].  Even if the patient has to pay out of pocket, with no ins coverage, it would be ~ $47.  Dr Vickey Huger, okay to change Rx, or would you like to prescribe something else?  Also, patient is asking about the use Gabapentin for Night Terrors per Valerie's previous note.  Please advise.  Thank you.

## 2013-04-19 ENCOUNTER — Other Ambulatory Visit: Payer: Self-pay | Admitting: Neurology

## 2013-04-19 DIAGNOSIS — F514 Sleep terrors [night terrors]: Secondary | ICD-10-CM

## 2013-04-19 MED ORDER — GABAPENTIN 300 MG PO CAPS: 300.0000 mg | ORAL_CAPSULE | Freq: Every evening | ORAL | Status: DC

## 2013-04-20 ENCOUNTER — Encounter: Payer: Self-pay | Admitting: *Deleted

## 2013-04-20 ENCOUNTER — Encounter (INDEPENDENT_AMBULATORY_CARE_PROVIDER_SITE_OTHER): Payer: Self-pay | Admitting: General Surgery

## 2013-04-20 ENCOUNTER — Ambulatory Visit (HOSPITAL_BASED_OUTPATIENT_CLINIC_OR_DEPARTMENT_OTHER): Payer: Medicare Other | Admitting: General Surgery

## 2013-04-20 ENCOUNTER — Encounter: Payer: Self-pay | Admitting: Oncology

## 2013-04-20 ENCOUNTER — Ambulatory Visit
Admission: RE | Admit: 2013-04-20 | Discharge: 2013-04-20 | Disposition: A | Discharge status: Home / Self Care | Payer: Medicare Other | Source: Ambulatory Visit | Attending: Radiation Oncology | Admitting: Radiation Oncology

## 2013-04-20 ENCOUNTER — Ambulatory Visit (HOSPITAL_BASED_OUTPATIENT_CLINIC_OR_DEPARTMENT_OTHER): Payer: Medicare Other | Admitting: Oncology

## 2013-04-20 ENCOUNTER — Ambulatory Visit: Payer: Medicare Other | Attending: General Surgery | Admitting: Physical Therapy

## 2013-04-20 ENCOUNTER — Telehealth: Payer: Self-pay | Admitting: Neurology

## 2013-04-20 ENCOUNTER — Ambulatory Visit: Payer: Medicare Other | Admitting: Oncology

## 2013-04-20 ENCOUNTER — Other Ambulatory Visit (HOSPITAL_BASED_OUTPATIENT_CLINIC_OR_DEPARTMENT_OTHER): Payer: Medicare Other | Admitting: Lab

## 2013-04-20 ENCOUNTER — Ambulatory Visit: Payer: Medicare Other

## 2013-04-20 VITALS — BP 115/66 | HR 83 | Temp 97.8°F | Resp 20 | Ht 64.0 in | Wt 162.9 lb

## 2013-04-20 DIAGNOSIS — M25519 Pain in unspecified shoulder: Secondary | ICD-10-CM | POA: Insufficient documentation

## 2013-04-20 DIAGNOSIS — C50312 Malignant neoplasm of lower-inner quadrant of left female breast: Secondary | ICD-10-CM

## 2013-04-20 DIAGNOSIS — C50919 Malignant neoplasm of unspecified site of unspecified female breast: Secondary | ICD-10-CM | POA: Insufficient documentation

## 2013-04-20 DIAGNOSIS — C50319 Malignant neoplasm of lower-inner quadrant of unspecified female breast: Secondary | ICD-10-CM

## 2013-04-20 DIAGNOSIS — R293 Abnormal posture: Secondary | ICD-10-CM | POA: Insufficient documentation

## 2013-04-20 DIAGNOSIS — Z96619 Presence of unspecified artificial shoulder joint: Secondary | ICD-10-CM | POA: Insufficient documentation

## 2013-04-20 DIAGNOSIS — IMO0001 Reserved for inherently not codable concepts without codable children: Secondary | ICD-10-CM | POA: Insufficient documentation

## 2013-04-20 DIAGNOSIS — Z01818 Encounter for other preprocedural examination: Secondary | ICD-10-CM | POA: Insufficient documentation

## 2013-04-20 LAB — COMPREHENSIVE METABOLIC PANEL (CC13)
ALT: 20 U/L (ref 0–55)
AST: 23 U/L (ref 5–34)
Alkaline Phosphatase: 111 U/L (ref 40–150)
Potassium: 3.7 mEq/L (ref 3.5–5.1)
Sodium: 143 mEq/L (ref 136–145)
Total Bilirubin: 0.54 mg/dL (ref 0.20–1.20)
Total Protein: 6.7 g/dL (ref 6.4–8.3)

## 2013-04-20 LAB — CBC WITH DIFFERENTIAL/PLATELET
BASO%: 0.8 % (ref 0.0–2.0)
EOS%: 2.5 % (ref 0.0–7.0)
LYMPH%: 17.1 % (ref 14.0–49.7)
MCHC: 34 g/dL (ref 31.5–36.0)
MCV: 89.8 fL (ref 79.5–101.0)
MONO%: 7.8 % (ref 0.0–14.0)
Platelets: 182 10*3/uL (ref 145–400)
RBC: 4.41 10*6/uL (ref 3.70–5.45)
RDW: 13.5 % (ref 11.2–14.5)

## 2013-04-20 NOTE — Progress Notes (Signed)
ID: [name] OB: [birth year]  MR#: [id number]  WUJ#:[id number]  PCP: [name], MD GYN:  Richarda Overlie SU: Felicity Pellegrini OTHER MD: Lina Sayre, Achilles Dunk [place], [place] Mann   HISTORY OF PRESENT ILLNESS: Beverely Low had routine screening mammography 03/22/2013 showing a possible mass in the left breast. Left diagnostic mammography 04/07/2013 confirmed an irregular mass in the lower inner quadrant of the left breast, associated with her sub-glandular implant. Biopsy of this mass 04/08/2013 showed ([id number]) an invasive ductal carcinoma, grade 1, estrogen receptor 100% positive, progesterone receptor 86% positive, with an MIB-1 of 7% and no HER-2 amplification.  Bilateral breast MRIs 04/17/2013 showed no abnormal lymphadenopathy. The mass in question measured 1.7 cm by MRI.  Her subsequent history is as detailed below  INTERVAL HISTORY:  Beverely Low was seen at the multidisciplinary breast cancer clinic 04-2013 accompanied by her sister Dewayne Hatch and the patient's granddaughter  REVIEW OF SYSTEMS: The patient was having no symptoms leading to her mammography, which was routine. Even after knowing the mass was there she was not able to palpate it. She has chronic back pain and muscle aches and a significant past medical history, but currently she feels "fine" and denies any unexplained weight loss, fatigue, dizziness, visual changes, nausea, vomiting, cough, phlegm production, pleurisy, chest pain or pressure, or change in bowel or bladder habits. A detailed review of systems was otherwise noncontributory  PAST MEDICAL HISTORY: Past Medical History  Diagnosis Date  . CAD (coronary artery disease)     a. ant MI 24 tx with TPA/PTCA. b. Prior stenting procedures including RCA stent, DES to LCx 04/2011. c. [place] -> DES  to prox LAD 08/2012.   Marland Kitchen Other emphysema   . Unspecified arthropathy, shoulder region   . Gastric ulcer, unspecified as acute or chronic, without mention of hemorrhage, perforation, or  obstruction   . Benign paroxysmal positional vertigo   . COPD (chronic obstructive pulmonary disease)   . Hypertension   . Diverticula of colon   . Osteopenia   . Personal history of colonic polyps 2007    tubular adenoma high grade dysplasia  . Diverticulitis   . Hyperlipemia   . Fracture of ankle, bimalleolar, right, closed   . Hypoxemia   . C. difficile diarrhea   . Varicose vein   . Asthma   . GERD (gastroesophageal reflux disease)   . Hepatitis 1989  . Arthritis     "both shoulders"   . CVA (cerebral vascular accident)   . TIA (transient ischemic attack) 04/2008; 08/21/2012    "slight droop left lower lip after 08/21/12 TIA"  . On home oxygen therapy   . Ischemic cardiomyopathy     a. EF 45-50% 2011. b. EF 35-40% by cath 09/01/12.  . Night terrors, adult     dr Pearlean Brownie referred  for PSG in 2011, and again in 2013   . Stroke, acute, embolic     Nov 2013 , [place]  [facility] , was treated with TPA.   . Breast cancer     PAST SURGICAL HISTORY: Past Surgical History  Procedure Laterality Date  . Total shoulder arthroplasty  2012    left  . Orif ankle fracture  2007    right  . Martial megeti  [id number]    "bladder operation; not successful " (08/31/2012)  . Varicose vein surgery  1983; 1985    "both legs both times" (08/31/2012)  . Total shoulder replacement  12/05/2010    Dr Rennis Chris  .  Abdominal hysterectomy      TAH w/BSO  . Augmentation mammaplasty  1975  . Coronary angioplasty with stent placement  2010  . Coronary angioplasty  1990    "3 times" (08/31/2012)  . Colon surgery  07/07/2011    Dr. [name]  . Appendectomy  1960    FAMILY HISTORY Family History  Problem Relation Age of Onset  . Breast cancer Sister     x 2  . Colon cancer Neg Hx   . Diabetes Paternal Grandmother   . Uterine cancer Paternal Grandmother   . Lung cancer Paternal Grandmother   . Heart disease Mother   . Heart disease Father   . Pancreatic cancer Cousin   . Colon polyps Sister   .  Diabetes Maternal Uncle   . Heart disease Maternal Grandfather   . Heart disease Paternal Grandfather   . Stroke Paternal Grandfather    the patient's father died at age 30 from heart disease. The patient's mother died at 33 from heart disease and COPD. The patient had no brothers, 3 sisters. Sister and has no history of cancer. Sister Mindi Junker was diagnosed with breast cancer at the age of 22. She is doing well. Sister Elease Hashimoto was diagnosed with breast cancer at the age of 56. She has had a more rocky course, but survives. There is no history of ovarian cancer in the family  GYNECOLOGIC HISTORY:  Menarche age 34, first live birth age 64, the patient is GX P3 she had a total abdominal hysterectomy with bilateral salpingo-oophorectomy at the age of 53. She took hormone replacement approximately 5 years. She also took birth control remotely, with no complications.  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 She is divorced and lives by herself, with no pets.    ADVANCED DIRECTIVES: In place   HEALTH MAINTENANCE: History  Substance Use Topics  . Smoking status: Former Smoker -- 1.00 packs/day for 40 years    Types: Cigarettes    Quit date: 10/19/1997  . Smokeless tobacco: Never Used  . Alcohol Use: 1.8 oz/week    3 Glasses of wine per week     Comment: occasionally     Colonoscopy: [facility]  PAP:  Bone density:  Lipid panel:  Allergies  Allergen Reactions  . Escitalopram Oxalate Shortness Of Breath, Diarrhea and Nausea And Vomiting    Hot flashes, dizziness  . Indomethacin Other (See Comments)    "made me very very dizzy and made me have vertigo" (08/31/2012)  . Codeine Nausea And Vomiting    "I can take codeine in my cough medicine" (08/31/2012)  . Cephalexin Other (See Comments)    Complicated by C dif colitis  . Sulfonamide Derivatives Other (See Comments)    Drug induced hepatitis in 1989    Current Outpatient Prescriptions   Medication Sig Dispense Refill  . albuterol (PROVENTIL HFA;VENTOLIN HFA) 108 (90 BASE) MCG/ACT inhaler Inhale 2 puffs into the lungs every 6 (six) hours as needed. For shortness of breath      . alendronate (FOSAMAX) 70 MG tablet Take 1 tablet (70 mg total) by mouth every 7 (seven) days. Take with a full glass of water on an empty stomach.  12 tablet  1  . aspirin EC 81 MG tablet Take 1 tablet (81 mg total) by mouth daily.      Marland Kitchen Avocado Oil OIL daily.      . benzonatate (TESSALON) 200 MG capsule Take 1 capsule (  200 mg total) by mouth 3 (three) times daily as needed for cough.  270 capsule  0  . calcium-vitamin D (OSCAL WITH D) 500-200 MG-UNIT per tablet Take 0.5 tablets by mouth 2 (two) times daily.       [name] Sodium 30-100 MG CAPS Take 1 tablet by mouth daily. For constipation      . Cholecalciferol (VITAMIN D3) 2000 UNITS TABS Take 2 tablets by mouth 2 (two) times daily.       . clonazePAM (KLONOPIN) 0.25 MG disintegrating tablet Take 1 tablet (0.25 mg total) by mouth 2 (two) times daily as needed.  180 tablet  1  . clopidogrel (PLAVIX) 75 MG tablet Take 1 tablet (75 mg total) by mouth daily.  90 tablet  4  . Cranberry-Olive Leaf (URINARY TRACT HEALTH) 365-50 MG CAPS Take 1 capsule by mouth daily.       . Fluticasone-Salmeterol (ADVAIR DISKUS) 250-50 MCG/DOSE AEPB Inhale 1 puff into the lungs 2 (two) times daily.  2 each  0  . gabapentin (NEURONTIN) 300 MG capsule Take 1 capsule (300 mg total) by mouth Nightly.  90 capsule  3  . Garlic (ODORLESS GARLIC) 1250 MG TABS Take 1 tablet by mouth every other day.       Marland Kitchen L-Lysine 500 MG TABS Take 1 tablet by mouth every other day.       . loratadine (CLARITIN) 10 MG tablet Take 10 mg by mouth daily.      . metoprolol succinate (TOPROL-XL) 25 MG 24 hr tablet Take 1/2 tab daily      . Multiple Vitamin (MULTIVITAMIN) capsule Take 0.5 capsules by mouth 2 (two) times daily.       . nitroGLYCERIN (NITROSTAT) 0.4 MG SL tablet Place 1  tablet (0.4 mg total) under the tongue every 5 (five) minutes as needed for chest pain (up to 3 doses).  25 tablet  4  . Omega-3 Fatty Acids (FISH OIL) 1200 MG CAPS Take 1 capsule by mouth every evening.       . simvastatin (ZOCOR) 40 MG tablet Take 1 tablet (40 mg total) by mouth at bedtime.  90 tablet  3  . tiotropium (SPIRIVA HANDIHALER) 18 MCG inhalation capsule Place 1 capsule (18 mcg total) into inhaler and inhale daily.  20 capsule  0  . vitamin B-12 (CYANOCOBALAMIN) 1000 MCG tablet Take 1,000 mcg by mouth daily.         No current facility-administered medications for this visit.    OBJECTIVE: Middle-aged white woman in no acute distress Filed Vitals:   04/20/13 1236  BP: 115/66  Pulse: 83  Temp: 97.8 F (36.6 C)  Resp: 20     Body mass index is 27.95 kg/(m^2).    ECOG FS: 0  Sclerae unicteric Oropharynx clear No cervical or supraclavicular adenopathy Lungs no rales or rhonchi Heart regular rate and rhythm Abd benign MSK no focal spinal tenderness, no peripheral edema Neuro: non-focal, well-oriented, appropriate affect Breasts: The right breast is unremarkable. The left breast is status post recent biopsy. I do not palpate a well-defined mass. The left axilla is benign.   LAB RESULTS:  CMP     Component Value Date/Time   NA 143 04/20/2013 1221   NA 141 10/07/2012 1306   K 3.7 04/20/2013 1221   K 3.8 10/07/2012 1306   CL 104 10/07/2012 1306   CO2 27 04/20/2013 1221   CO2 30 10/07/2012 1306   GLUCOSE 108 04/20/2013 1221   GLUCOSE 104*  10/07/2012 1306   GLUCOSE 101* 08/04/2006 1026   BUN 25.6 04/20/2013 1221   BUN 16 10/07/2012 1306   CREATININE 0.9 04/20/2013 1221   CREATININE 0.8 10/07/2012 1306   CREATININE 0.85 07/23/2011 1635   CALCIUM 9.3 04/20/2013 1221   CALCIUM 9.6 10/07/2012 1306   PROT 6.7 04/20/2013 1221   PROT 6.8 08/31/2012 1554   ALBUMIN 3.7 04/20/2013 1221   ALBUMIN 3.9 08/31/2012 1554   AST 23 04/20/2013 1221   AST 20 08/31/2012 1554   ALT 20 04/20/2013 1221    ALT 18 08/31/2012 1554   ALKPHOS 111 04/20/2013 1221   ALKPHOS 107 08/31/2012 1554   BILITOT 0.54 04/20/2013 1221   BILITOT 0.6 08/31/2012 1554   GFRNONAA 53* 09/02/2012 0450   GFRAA 62* 09/02/2012 0450    I No results found for this basename: SPEP, UPEP,  kappa and lambda light chains    Lab Results  Component Value Date   WBC 8.4 04/20/2013   NEUTROABS 6.0 04/20/2013   HGB 13.5 04/20/2013   HCT 39.6 04/20/2013   MCV 89.8 04/20/2013   PLT 182 04/20/2013      Chemistry      Component Value Date/Time   NA 143 04/20/2013 1221   NA 141 10/07/2012 1306   K 3.7 04/20/2013 1221   K 3.8 10/07/2012 1306   CL 104 10/07/2012 1306   CO2 27 04/20/2013 1221   CO2 30 10/07/2012 1306   BUN 25.6 04/20/2013 1221   BUN 16 10/07/2012 1306   CREATININE 0.9 04/20/2013 1221   CREATININE 0.8 10/07/2012 1306   CREATININE 0.85 07/23/2011 1635      Component Value Date/Time   CALCIUM 9.3 04/20/2013 1221   CALCIUM 9.6 10/07/2012 1306   ALKPHOS 111 04/20/2013 1221   ALKPHOS 107 08/31/2012 1554   AST 23 04/20/2013 1221   AST 20 08/31/2012 1554   ALT 20 04/20/2013 1221   ALT 18 08/31/2012 1554   BILITOT 0.54 04/20/2013 1221   BILITOT 0.6 08/31/2012 1554       No results found for this basename: LABCA2    No components found with this basename: LABCA125    No results found for this basename: INR,  in the last 168 hours  Urinalysis    Component Value Date/Time   COLORURINE YELLOW 03/21/2012 2006   APPEARANCEUR HAZY* 03/21/2012 2006   LABSPEC 1.017 03/21/2012 2006   PHURINE 6.5 03/21/2012 2006   GLUCOSEU NEGATIVE 03/21/2012 2006   HGBUR NEGATIVE 03/21/2012 2006   BILIRUBINUR NEGATIVE 03/21/2012 2006   BILIRUBINUR Neg 02/05/2012 1602   KETONESUR NEGATIVE 03/21/2012 2006   PROTEINUR NEGATIVE 03/21/2012 2006   UROBILINOGEN 1.0 03/21/2012 2006   UROBILINOGEN 0.2 02/05/2012 1602   NITRITE POSITIVE* 03/21/2012 2006   NITRITE Neg 02/05/2012 1602   LEUKOCYTESUR SMALL* 03/21/2012 2006    STUDIES: US Breast Left  04/07/2013    *RADIOLOGY REPORT*  Clinical Data:  The patient returns after screening study for evaluation of the left breast. The patient has a family history breast cancer, diagnosed in two sisters at ages 57 of 25.  DIGITAL DIAGNOSTIC LEFT MAMMOGRAM  AND LEFT BREAST ULTRASOUND:  Comparison:  03/23/2013 and earlier  Findings:  ACR Breast Density Category 2: There is a scattered fibroglandular pattern.  The patient has a subglandular implant.  Spot compression views confirm presence of an irregular mass in the lower inner quadrant of the left breast.  On physical exam, I palpate no abnormality in the lower inner quadrant of  the left breast.  Ultrasound is performed, showing an irregular hypoechoic mass in the 8 o'clock location of the left breast, 9 cm from the nipple. This is immediately adjacent to the anterior wall of the implant and measures 1.6 x 1.2 x 2.0 cm.  Mass is vascular based on Doppler evaluation.  Evaluation of the left axilla is negative for adenopathy.  IMPRESSION:  1.  Irregular, suspicious mass in the 8 o'clock location of the left breast, 9 cm from nipple.  Tissue diagnosis is recommended. 2.  The patient is on Plavix and aspirin because of a history of stroke and heart attack.  We discussed the slightly higher risk of bruising related to the biopsy.  However, because of the patient's high risk for thromboembolic disease, I asked the patient to continue the Plavix and aspirin.  RECOMMENDATION: Ultrasound guided core biopsy is recommended and has been scheduled for the patient at 3:45 p.m. on 04/08/2013.  I have discussed the findings and recommendations with the patient. Results were also provided in writing at the conclusion of the visit.  If applicable, a reminder letter will be sent to the patient regarding the next appointment.  BI-RADS CATEGORY 4:  Suspicious abnormality - biopsy should be considered.   Original Report Authenticated By: Norva Pavlov, M.D.   Mr Breast Bilateral W Wo  Contrast  04/19/2013   *RADIOLOGY REPORT*  Clinical Data: Newly-diagnosed left breast eight o'clock location invasive ductal carcinoma.  BUN and creatinine were obtained on site at [facility] at [address] Results:  BUN 18 mg/dL,  Creatinine 0.9 mg/dL.  BILATERAL BREAST MRI WITH AND WITHOUT CONTRAST  Technique: Multiplanar, multisequence MR images of both breasts were obtained prior to and following the intravenous administration of 15ml of Multihance.  Three dimensional images were evaluated at the independent DynaCad workstation.  Comparison:  Prior mammograms and ultrasound  Findings: Background parenchymal enhancement is mild.  The breast parenchymal pattern demonstrates scattered fibroglandular densities. No abnormal T2-weighted hyperintensity or lymphadenopathy is seen on either side.  There is an oval enhancing mass with central clip artifact in the left breast eight o'clock location measuring 1.7 x 1.7 x 1.7 cm, demonstrating plateau type enhancement.  This corresponds to the biopsy-proven breast cancer. This directly abuts the left subglandular silicone implant.  The patient is not performed for the specific evaluation of implant integrity.  No other area of abnormal enhancement is seen in either breast.  IMPRESSION: Solitary enhancing left breast mass 8 o'clock location, corresponding to biopsy-proven breast cancer.  No MRI evidence for multifocal/multicentric or contralateral malignancy.  RECOMMENDATION: Treatment plan  THREE-DIMENSIONAL MR IMAGE RENDERING ON INDEPENDENT WORKSTATION:  Three-dimensional MR images were rendered by post-processing of the original MR data on an independent workstation.  The three- dimensional MR images were interpreted, and findings were reported in the accompanying complete MRI report for this study.  BI-RADS CATEGORY 6:  Known biopsy-proven malignancy - appropriate action should be taken.   Original Report Authenticated By: Christiana Pellant, M.D.   Mm  Digital Diag Ltd L  04/07/2013   *RADIOLOGY REPORT*  Clinical Data:  The patient returns after screening study for evaluation of the left breast. The patient has a family history breast cancer, diagnosed in two sisters at ages 29 of 5.  DIGITAL DIAGNOSTIC LEFT MAMMOGRAM  AND LEFT BREAST ULTRASOUND:  Comparison:  03/23/2013 and earlier  Findings:  ACR Breast Density Category 2: There is a scattered fibroglandular pattern.  The patient has a subglandular implant.  Spot compression views confirm presence of an irregular mass in the lower inner quadrant of the left breast.  On physical exam, I palpate no abnormality in the lower inner quadrant of the left breast.  Ultrasound is performed, showing an irregular hypoechoic mass in the 8 o'clock location of the left breast, 9 cm from the nipple. This is immediately adjacent to the anterior wall of the implant and measures 1.6 x 1.2 x 2.0 cm.  Mass is vascular based on Doppler evaluation.  Evaluation of the left axilla is negative for adenopathy.  IMPRESSION:  1.  Irregular, suspicious mass in the 8 o'clock location of the left breast, 9 cm from nipple.  Tissue diagnosis is recommended. 2.  The patient is on Plavix and aspirin because of a history of stroke and heart attack.  We discussed the slightly higher risk of bruising related to the biopsy.  However, because of the patient's high risk for thromboembolic disease, I asked the patient to continue the Plavix and aspirin.  RECOMMENDATION: Ultrasound guided core biopsy is recommended and has been scheduled for the patient at 3:45 p.m. on 04/08/2013.  I have discussed the findings and recommendations with the patient. Results were also provided in writing at the conclusion of the visit.  If applicable, a reminder letter will be sent to the patient regarding the next appointment.  BI-RADS CATEGORY 4:  Suspicious abnormality - biopsy should be considered.   Original Report Authenticated By: Norva Pavlov, M.D.   Mm  Digital Diagnostic Unilat L  04/08/2013   *RADIOLOGY REPORT*  Clinical Data:  Ultrasound-guided left breast biopsy eight o'clock location  DIGITAL DIAGNOSTIC LEFT MAMMOGRAM  Comparison:  Previous exams.  Findings:  Films are performed following ultrasound guided biopsy of left breast mass 8 o'clock location, with ribbon shaped clip placement.  The clip is appropriately located.  IMPRESSION: Appropriate ribbon shaped clip location, left breast mass 8 o'clock location.   Original Report Authenticated By: Christiana Pellant, M.D.   Mm Digital Screening W/ Implants  03/23/2013   *RADIOLOGY REPORT*  Clinical Data: Screening.  DIGITAL SCREENING BILATERAL MAMMOGRAM WITH IMPLANTS AND CAD The patient has retroglandular implants. Standard and implant displaced views were performed.  DIGITAL BREAST TOMOSYNTHESIS  Digital breast tomosynthesis images are acquired in two projections.  These images are reviewed in combination with the digital mammogram, confirming the findings below.  Comparison:  Previous exams.  FINDINGS:  ACR Breast Density Category 2: There is a scattered fibroglandular pattern.  In the left breast, a possible mass warrants further evaluation with spot compression views and possibly ultrasound.  In the right breast, no masses or malignant type calcifications are identified.  Images were processed with CAD.  IMPRESSION: Further evaluation is suggested for possible mass in the left breast.  RECOMMENDATION: Diagnostic mammogram and possibly ultrasound of the left breast. (Code:FI-L-48M)  The patient will be contacted regarding the findings, and additional imaging will be scheduled.  BI-RADS CATEGORY 0:  Incomplete.  Need additional imaging evaluation and/or prior mammograms for comparison.   Original Report Authenticated By: Esperanza Heir, M.D.   Mm Radiologist Eval And Mgmt  04/11/2013   *RADIOLOGY REPORT*  ESTABLISHED PATIENT OFFICE VISIT - LEVEL II [id number])  Chief Complaint:  The patient returns for results  following left breast ultrasound guided core biopsy.  History:  The patient was recalled from screening mammography for a left breast mass.  Left breast ultrasound guided core biopsy performed.  The patient has a family history breast cancer in two sisters (  premenopausal).The patient is on Plavix.  Exam:  The biopsy site demonstrates moderate ecchymosis but no hematoma or signs of infection.  Pathology: Pathology demonstrated grade 1 invasive ductal carcinoma.  This is concordant with imaging findings.  Assessment and Plan:  I have discussed the findings with the patient and her daughter (who is a nurse)and answered their questions.  Currently, the patient is scheduled for breast MRI on 04/18/2013 and the breast cancer multidisciplinary clinic on 04/20/2013.  Also, the patient plans to contact an oncologist in [place], [place] ( Dr. Cheree Ditto) for consultation.  I have encouraged the patient to contact the [facility] for additional questions or concerns.   Original Report Authenticated By: Rolla Plate, M.D.   [place] Lt Breast Bx W Loc Dev 1st Lesion Img Bx Spec US Guide  04/08/2013   *RADIOLOGY REPORT*  Clinical Data:  Suspicious left breast mass 8 o'clock location.  ULTRASOUND GUIDED CORE BIOPSY OF THE left BREAST  Comparison: Previous exams.  I met with the patient and we discussed the procedure of ultrasound- guided biopsy, including benefits and alternatives.  We discussed the high likelihood of a successful procedure. We discussed the risks of the procedure, including infection, bleeding, tissue injury, clip migration, and inadequate sampling.  Informed written consent was given. The usual time-out protocol was performed immediately prior to the procedure.  Using sterile technique and 2% Lidocaine as local anesthetic, under direct ultrasound visualization, a 14 gauge automated biopsy device was used to perform biopsy of left breast mass 8 o'clock location using a lateral to medial approach.  At the  conclusion of the procedure a ribbon shaped tissue marker clip was deployed into the biopsy cavity.  Follow up 2 view mammogram was performed and dictated separately.  IMPRESSION: Ultrasound guided biopsy of left breast mass 8 o'clock location, with ribbon shaped clip placement.  No apparent complications.   Original Report Authenticated By: Christiana Pellant, M.D.    ASSESSMENT: 72 y.o. [place] woman status post left breast biopsy 04/08/2013 for a clinical T1c N0, stage IA invasive ductal carcinoma, grade 1, estrogen and progesterone receptor positive, with no HER-2 amplification, and an MIB-1 of 7%  PLAN: We spent the better part of today's hour-long visit it going over the biology of breast cancer and the specifics of Antonietta's on tumor. She understands she is a very good candidate for breast conservation and that this is our recommendation. She will also benefit from antiestrogen.  The chemotherapy question and the radiation question are more complex. If margins are good and she is able to tolerate anti-estrogens then perhaps radiation might not be needed. I am hopeful chemotherapy will not be needed and to help [place] with that decision we are requesting an Oncotype to be sent from her definitive surgery.  She will also benefit from genetics testing and this has already been scheduled.  She is going to see me approximately 3 weeks after her surgery so we can make a definitive decision regarding chemotherapy. She will also benefit from review of her prior bone density. She is going to return to see me in approximately 6 weeks, by which time we should have the final pathology and the genetics results available. She knows to call for any problems that may develop before the next visit.  Lowella Dell, MD   04/20/2013 5:28 PM

## 2013-04-20 NOTE — Progress Notes (Signed)
Checked in new pt with no financial concerns. °

## 2013-04-20 NOTE — Telephone Encounter (Signed)
i have tried over and over to reach this patient , today again. She need to leave a mobile number or a time to get calls.   Gabapentin at night helps with some night terrors , taken at 100 mg at night the first week, than 200 than 300 mg . If she wants to start this , she can quit the seroquel.   patient has had a stroke and TPA treatment earlier this year , will ask Dr. Pearlean Brownie if any reservation about meds.

## 2013-04-20 NOTE — Progress Notes (Signed)
Subjective:     Patient ID: [name], female   DOB: 07-Jul-1941, 72 y.o.   MRN: [id number]  HPI We're asked to see the patient in consultation by Dr. Deboraha Sprang to evaluate her for a left sided breast cancer. The patient is a 72 year old white female who recently went for a routine screening mammogram. At that time she was found to have an abnormality in the lower inner left breast. This was biopsied and came back as an invasive breast cancer. By MRI it measured 1.7 cm. She does have subglandular implants and the tumor is sitting on the capsule of the implant. She is ER and PR positive and HER-2 negative. Her Ki-67 a 7%. She denies any breast pain. She denies any discharge from her nipple. She does have 2 sisters that were diagnosed with breast cancer in their 30s and 61s. She also has a history of COPD and coronary artery disease and had a stent placed in her heart in November of 2013. She takes both aspirin and Plavix for this. She also had a TIA in November of 2013 as well. She has no residual deficits.  Review of Systems  Constitutional: Negative.   HENT: Negative.   Eyes: Negative.   Respiratory: Negative.   Cardiovascular: Negative.   Gastrointestinal: Negative.   Endocrine: Negative.   Genitourinary: Negative.   Musculoskeletal: Negative.   Skin: Negative.   Allergic/Immunologic: Negative.   Neurological: Negative.   Hematological: Negative.   Psychiatric/Behavioral: Negative.        Objective:   Physical Exam  Constitutional: She is oriented to person, place, and time. She appears well-developed and well-nourished.  HENT:  Head: Normocephalic and atraumatic.  Eyes: Conjunctivae and EOM are normal. Pupils are equal, round, and reactive to light.  Neck: Normal range of motion. Neck supple.  Cardiovascular: Normal rate, regular rhythm and normal heart sounds.   Pulmonary/Chest: Effort normal and breath sounds normal.  The patient has subglandular implants bilaterally. She may  have a small palpable mass in the lower inner quadrant of the left breast next to the implant but this is difficult to feel. There is no other palpable mass in either breast. There is no palpable axillary, supraclavicular, or cervical lymphadenopathy  Abdominal: Soft. Bowel sounds are normal. She exhibits no mass. There is no tenderness.  Musculoskeletal: Normal range of motion.  Lymphadenopathy:    She has no cervical adenopathy.  Neurological: She is alert and oriented to person, place, and time.  Skin: Skin is warm and dry.  Psychiatric: She has a normal mood and affect. Her behavior is normal.       Assessment:     The patient appears to have a small stage I cancer in the lower inner left breast. The cancer is sitting on her capsule of her implant. I've discussed with her in detail the different options for treatment and she favors breast conservation. I think this is a reasonable option for her. If she chooses breast conservation she will need to have the capsule and implant removed on the left. She is also a good candidate for sentinel node mapping. I have discussed all this with her in detail including the risks and benefits of surgery as well as some of the technical aspects. She understands but has not decided on who she would like to be her surgeon at this point. She will also need clearance from her cardiologist and pulmonologist prior to scheduling any surgery. If she chooses to have surgery with a  she will need a wire localized lumpectomy on the left with capsulectomy and removal of her implant with sentinel node mapping and injection of blue dye. It is also likely that she will need a drain in the cavity left behind by the implant.    Plan:     We will plan for cardiac and pulmonary clearance and then we will wait for her decision on surgery

## 2013-04-21 ENCOUNTER — Telehealth: Payer: Self-pay | Admitting: *Deleted

## 2013-04-21 ENCOUNTER — Encounter (INDEPENDENT_AMBULATORY_CARE_PROVIDER_SITE_OTHER): Payer: Self-pay

## 2013-04-21 NOTE — Telephone Encounter (Signed)
sw pt gv appt d/t for 05/20/13 @9 :30am. Pt is aware...td

## 2013-04-22 NOTE — Progress Notes (Signed)
Radiation Oncology         ([phone]) [phone] ________________________________  Name: KEM PARCHER MRN: [id number]  Date: 04/20/2013  DOB: Sep 06, 1941  CC:[name], MD  Robyne Askew, MD   Ruthann Cancer, MD  REFERRING PHYSICIAN: Robyne Askew, MD   DIAGNOSIS: The encounter diagnosis was Cancer of lower-inner quadrant of left female breast.   HISTORY OF PRESENT ILLNESS::[name] is a 72 y.o. female who is seen for an initial consultation visit. The patient was had a possible mass in the left breast on recent screening mammography. She proceeded with a diagnostic mammogram and ultrasound which showed an irregular mass in the lower inner quadrant of the left breast. This was closely associated with the sub-glandular implant which was present. This measured 2.0 cm and was present at the 8:00 position, 9 cm from the nipple. This mass was biopsied and revealed an invasive ductal carcinoma. Receptor studies indicated that the tumor was ER positive, PR positive, and HER-2/neu negative. The Ki-67 was 7%.  An MRI scan of the breast bilaterally was performed. This revealed a 1.7 cm mass which was a solitary finding, again closely associated with the implant. No lymphadenopathy present.  The patient is seen in multidisciplinary breast clinic today for evaluation and treatment planning with regards to this recent diagnosis.   PREVIOUS RADIATION THERAPY: No   PAST MEDICAL HISTORY:  has a past medical history of CAD (coronary artery disease); Other emphysema; Unspecified arthropathy, shoulder region; Gastric ulcer, unspecified as acute or chronic, without mention of hemorrhage, perforation, or obstruction; Benign paroxysmal positional vertigo; COPD (chronic obstructive pulmonary disease); Hypertension; Diverticula of colon; Osteopenia; Personal history of colonic polyps (2007); Diverticulitis; Hyperlipemia; Fracture of ankle, bimalleolar, right, closed; Hypoxemia; C. difficile diarrhea;  Varicose vein; Asthma; GERD (gastroesophageal reflux disease); Hepatitis (1989); Arthritis; CVA (cerebral vascular accident); TIA (transient ischemic attack) (04/2008; 08/21/2012); On home oxygen therapy; Ischemic cardiomyopathy; Night terrors, adult; Stroke, acute, embolic; and Breast cancer.     PAST SURGICAL HISTORY: Past Surgical History  Procedure Laterality Date  . Total shoulder arthroplasty  2012    left  . Orif ankle fracture  2007    right  . Martial megeti  2536'U    "bladder operation; not successful " (08/31/2012)  . Varicose vein surgery  1983; 1985    "both legs both times" (08/31/2012)  . Total shoulder replacement  12/05/2010    Dr Rennis Chris  . Abdominal hysterectomy      TAH w/BSO  . Augmentation mammaplasty  1975  . Coronary angioplasty with stent placement  2010  . Coronary angioplasty  1990    "3 times" (08/31/2012)  . Colon surgery  07/07/2011    Dr. [name]  . Appendectomy  1960     FAMILY HISTORY: family history includes Breast cancer in her sister; Colon polyps in her sister; Diabetes in her maternal uncle and paternal grandmother; Heart disease in her father, maternal grandfather, mother, and paternal grandfather; Lung cancer in her paternal grandmother; Pancreatic cancer in her cousin; Stroke in her paternal grandfather; and Uterine cancer in her paternal grandmother.  There is no history of Colon cancer.   SOCIAL HISTORY:  reports that she quit smoking about 15 years ago. Her smoking use included Cigarettes. She has a 40 pack-year smoking history. She has never used smokeless tobacco. She reports that she drinks about 1.8 ounces of alcohol per week. She reports that she does not use illicit drugs.   ALLERGIES: Escitalopram oxalate; Indomethacin; Codeine;  Cephalexin; and Sulfonamide derivatives   MEDICATIONS:  Current Outpatient Prescriptions  Medication Sig Dispense Refill  . albuterol (PROVENTIL HFA;VENTOLIN HFA) 108 (90 BASE) MCG/ACT inhaler Inhale 2  puffs into the lungs every 6 (six) hours as needed. For shortness of breath      . alendronate (FOSAMAX) 70 MG tablet Take 1 tablet (70 mg total) by mouth every 7 (seven) days. Take with a full glass of water on an empty stomach.  12 tablet  1  . aspirin EC 81 MG tablet Take 1 tablet (81 mg total) by mouth daily.      Marland Kitchen Avocado Oil OIL daily.      . benzonatate (TESSALON) 200 MG capsule Take 1 capsule (200 mg total) by mouth 3 (three) times daily as needed for cough.  270 capsule  0  . calcium-vitamin D (OSCAL WITH D) 500-200 MG-UNIT per tablet Take 0.5 tablets by mouth 2 (two) times daily.       [name] Sodium 30-100 MG CAPS Take 1 tablet by mouth daily. For constipation      . Cholecalciferol (VITAMIN D3) 2000 UNITS TABS Take 2 tablets by mouth 2 (two) times daily.       . clonazePAM (KLONOPIN) 0.25 MG disintegrating tablet Take 1 tablet (0.25 mg total) by mouth 2 (two) times daily as needed.  180 tablet  1  . clopidogrel (PLAVIX) 75 MG tablet Take 1 tablet (75 mg total) by mouth daily.  90 tablet  4  . Cranberry-Olive Leaf (URINARY TRACT HEALTH) 365-50 MG CAPS Take 1 capsule by mouth daily.       . Fluticasone-Salmeterol (ADVAIR DISKUS) 250-50 MCG/DOSE AEPB Inhale 1 puff into the lungs 2 (two) times daily.  2 each  0  . gabapentin (NEURONTIN) 300 MG capsule Take 1 capsule (300 mg total) by mouth Nightly.  90 capsule  3  . Garlic (ODORLESS GARLIC) 1250 MG TABS Take 1 tablet by mouth every other day.       Marland Kitchen L-Lysine 500 MG TABS Take 1 tablet by mouth every other day.       . loratadine (CLARITIN) 10 MG tablet Take 10 mg by mouth daily.      . metoprolol succinate (TOPROL-XL) 25 MG 24 hr tablet Take 1/2 tab daily      . Multiple Vitamin (MULTIVITAMIN) capsule Take 0.5 capsules by mouth 2 (two) times daily.       . nitroGLYCERIN (NITROSTAT) 0.4 MG SL tablet Place 1 tablet (0.4 mg total) under the tongue every 5 (five) minutes as needed for chest pain (up to 3 doses).  25 tablet  4    . Omega-3 Fatty Acids (FISH OIL) 1200 MG CAPS Take 1 capsule by mouth every evening.       . simvastatin (ZOCOR) 40 MG tablet Take 1 tablet (40 mg total) by mouth at bedtime.  90 tablet  3  . tiotropium (SPIRIVA HANDIHALER) 18 MCG inhalation capsule Place 1 capsule (18 mcg total) into inhaler and inhale daily.  20 capsule  0  . vitamin B-12 (CYANOCOBALAMIN) 1000 MCG tablet Take 1,000 mcg by mouth daily.         No current facility-administered medications for this encounter.     REVIEW OF SYSTEMS:  A 15 point review of systems is documented in the electronic medical record. This was obtained by the nursing staff. However, I reviewed this with the patient to discuss relevant findings and make appropriate changes.  Pertinent items are noted in HPI.  PHYSICAL EXAM:  vitals were not taken for this visit.  General: Well-developed, in no acute distress HEENT: Normocephalic, atraumatic; oral cavity clear Neck: Supple without any lymphadenopathy Cardiovascular: Regular rate and rhythm Respiratory: Clear to auscultation bilaterally Breasts: The patient has some glandular implants bilaterally. I really cannot appreciate any clear palpable mass within the left breast. No palpable at and axillary adenopathy on either side and unremarkable right-sided breast exam GI: Soft, nontender, normal bowel sounds Extremities: No edema present Neuro: No focal deficits     LABORATORY DATA:  Lab Results  Component Value Date   WBC 8.4 04/20/2013   HGB 13.5 04/20/2013   HCT 39.6 04/20/2013   MCV 89.8 04/20/2013   PLT 182 04/20/2013   Lab Results  Component Value Date   NA 143 04/20/2013   K 3.7 04/20/2013   CL 104 10/07/2012   CO2 27 04/20/2013   Lab Results  Component Value Date   ALT 20 04/20/2013   AST 23 04/20/2013   ALKPHOS 111 04/20/2013   BILITOT 0.54 04/20/2013      RADIOGRAPHY: US Breast Left  04/07/2013   *RADIOLOGY REPORT*  Clinical Data:  The patient returns after screening study for evaluation of  the left breast. The patient has a family history breast cancer, diagnosed in two sisters at ages 64 of 30.  DIGITAL DIAGNOSTIC LEFT MAMMOGRAM  AND LEFT BREAST ULTRASOUND:  Comparison:  03/23/2013 and earlier  Findings:  ACR Breast Density Category 2: There is a scattered fibroglandular pattern.  The patient has a subglandular implant.  Spot compression views confirm presence of an irregular mass in the lower inner quadrant of the left breast.  On physical exam, I palpate no abnormality in the lower inner quadrant of the left breast.  Ultrasound is performed, showing an irregular hypoechoic mass in the 8 o'clock location of the left breast, 9 cm from the nipple. This is immediately adjacent to the anterior wall of the implant and measures 1.6 x 1.2 x 2.0 cm.  Mass is vascular based on Doppler evaluation.  Evaluation of the left axilla is negative for adenopathy.  IMPRESSION:  1.  Irregular, suspicious mass in the 8 o'clock location of the left breast, 9 cm from nipple.  Tissue diagnosis is recommended. 2.  The patient is on Plavix and aspirin because of a history of stroke and heart attack.  We discussed the slightly higher risk of bruising related to the biopsy.  However, because of the patient's high risk for thromboembolic disease, I asked the patient to continue the Plavix and aspirin.  RECOMMENDATION: Ultrasound guided core biopsy is recommended and has been scheduled for the patient at 3:45 p.m. on 04/08/2013.  I have discussed the findings and recommendations with the patient. Results were also provided in writing at the conclusion of the visit.  If applicable, a reminder letter will be sent to the patient regarding the next appointment.  BI-RADS CATEGORY 4:  Suspicious abnormality - biopsy should be considered.   Original Report Authenticated By: Norva Pavlov, M.D.   Mr Breast Bilateral W Wo Contrast  04/19/2013   *RADIOLOGY REPORT*  Clinical Data: Newly-diagnosed left breast eight o'clock location  invasive ductal carcinoma.  BUN and creatinine were obtained on site at [facility] at [address] Results:  BUN 18 mg/dL,  Creatinine 0.9 mg/dL.  BILATERAL BREAST MRI WITH AND WITHOUT CONTRAST  Technique: Multiplanar, multisequence MR images of both breasts were obtained prior to and following the intravenous administration of 15ml  of Multihance.  Three dimensional images were evaluated at the independent DynaCad workstation.  Comparison:  Prior mammograms and ultrasound  Findings: Background parenchymal enhancement is mild.  The breast parenchymal pattern demonstrates scattered fibroglandular densities. No abnormal T2-weighted hyperintensity or lymphadenopathy is seen on either side.  There is an oval enhancing mass with central clip artifact in the left breast eight o'clock location measuring 1.7 x 1.7 x 1.7 cm, demonstrating plateau type enhancement.  This corresponds to the biopsy-proven breast cancer. This directly abuts the left subglandular silicone implant.  The patient is not performed for the specific evaluation of implant integrity.  No other area of abnormal enhancement is seen in either breast.  IMPRESSION: Solitary enhancing left breast mass 8 o'clock location, corresponding to biopsy-proven breast cancer.  No MRI evidence for multifocal/multicentric or contralateral malignancy.  RECOMMENDATION: Treatment plan  THREE-DIMENSIONAL MR IMAGE RENDERING ON INDEPENDENT WORKSTATION:  Three-dimensional MR images were rendered by post-processing of the original MR data on an independent workstation.  The three- dimensional MR images were interpreted, and findings were reported in the accompanying complete MRI report for this study.  BI-RADS CATEGORY 6:  Known biopsy-proven malignancy - appropriate action should be taken.   Original Report Authenticated By: Christiana Pellant, M.D.   Mm Digital Diag Ltd L  04/07/2013   *RADIOLOGY REPORT*  Clinical Data:  The patient returns after screening study  for evaluation of the left breast. The patient has a family history breast cancer, diagnosed in two sisters at ages 28 of 83.  DIGITAL DIAGNOSTIC LEFT MAMMOGRAM  AND LEFT BREAST ULTRASOUND:  Comparison:  03/23/2013 and earlier  Findings:  ACR Breast Density Category 2: There is a scattered fibroglandular pattern.  The patient has a subglandular implant.  Spot compression views confirm presence of an irregular mass in the lower inner quadrant of the left breast.  On physical exam, I palpate no abnormality in the lower inner quadrant of the left breast.  Ultrasound is performed, showing an irregular hypoechoic mass in the 8 o'clock location of the left breast, 9 cm from the nipple. This is immediately adjacent to the anterior wall of the implant and measures 1.6 x 1.2 x 2.0 cm.  Mass is vascular based on Doppler evaluation.  Evaluation of the left axilla is negative for adenopathy.  IMPRESSION:  1.  Irregular, suspicious mass in the 8 o'clock location of the left breast, 9 cm from nipple.  Tissue diagnosis is recommended. 2.  The patient is on Plavix and aspirin because of a history of stroke and heart attack.  We discussed the slightly higher risk of bruising related to the biopsy.  However, because of the patient's high risk for thromboembolic disease, I asked the patient to continue the Plavix and aspirin.  RECOMMENDATION: Ultrasound guided core biopsy is recommended and has been scheduled for the patient at 3:45 p.m. on 04/08/2013.  I have discussed the findings and recommendations with the patient. Results were also provided in writing at the conclusion of the visit.  If applicable, a reminder letter will be sent to the patient regarding the next appointment.  BI-RADS CATEGORY 4:  Suspicious abnormality - biopsy should be considered.   Original Report Authenticated By: Norva Pavlov, M.D.   Mm Digital Diagnostic Unilat L  04/08/2013   *RADIOLOGY REPORT*  Clinical Data:  Ultrasound-guided left breast biopsy  eight o'clock location  DIGITAL DIAGNOSTIC LEFT MAMMOGRAM  Comparison:  Previous exams.  Findings:  Films are performed following ultrasound guided biopsy of left breast  mass 8 o'clock location, with ribbon shaped clip placement.  The clip is appropriately located.  IMPRESSION: Appropriate ribbon shaped clip location, left breast mass 8 o'clock location.   Original Report Authenticated By: Christiana Pellant, M.D.   Mm Radiologist Eval And Mgmt  04/11/2013   *RADIOLOGY REPORT*  ESTABLISHED PATIENT OFFICE VISIT - LEVEL II [phone])  Chief Complaint:  The patient returns for results following left breast ultrasound guided core biopsy.  History:  The patient was recalled from screening mammography for a left breast mass.  Left breast ultrasound guided core biopsy performed.  The patient has a family history breast cancer in two sisters (premenopausal).The patient is on Plavix.  Exam:  The biopsy site demonstrates moderate ecchymosis but no hematoma or signs of infection.  Pathology: Pathology demonstrated grade 1 invasive ductal carcinoma.  This is concordant with imaging findings.  Assessment and Plan:  I have discussed the findings with the patient and her daughter (who is a nurse)and answered their questions.  Currently, the patient is scheduled for breast MRI on 04/18/2013 and the breast cancer multidisciplinary clinic on 04/20/2013.  Also, the patient plans to contact an oncologist in [place], [place] ( Dr. Cheree Ditto) for consultation.  I have encouraged the patient to contact the [facility] for additional questions or concerns.   Original Report Authenticated By: Rolla Plate, M.D.   [place] Lt Breast Bx W Loc Dev 1st Lesion Img Bx Spec US Guide  04/08/2013   *RADIOLOGY REPORT*  Clinical Data:  Suspicious left breast mass 8 o'clock location.  ULTRASOUND GUIDED CORE BIOPSY OF THE left BREAST  Comparison: Previous exams.  I met with the patient and we discussed the procedure of ultrasound- guided biopsy,  including benefits and alternatives.  We discussed the high likelihood of a successful procedure. We discussed the risks of the procedure, including infection, bleeding, tissue injury, clip migration, and inadequate sampling.  Informed written consent was given. The usual time-out protocol was performed immediately prior to the procedure.  Using sterile technique and 2% Lidocaine as local anesthetic, under direct ultrasound visualization, a 14 gauge automated biopsy device was used to perform biopsy of left breast mass 8 o'clock location using a lateral to medial approach.  At the conclusion of the procedure a ribbon shaped tissue marker clip was deployed into the biopsy cavity.  Follow up 2 view mammogram was performed and dictated separately.  IMPRESSION: Ultrasound guided biopsy of left breast mass 8 o'clock location, with ribbon shaped clip placement.  No apparent complications.   Original Report Authenticated By: Christiana Pellant, M.D.       IMPRESSION: The patient is a pleasant 72 year old female who has a recent diagnosis of invasive ductal carcinoma of the left breast. Clinically, she is presenting with T1c, N0, M0 disease. The patient appears to be a an appropriate candidate for a lumpectomy which would also involve removal of the implant at that time given its close proximity to the tumor. Dr. Darnelle Catalan has seen the patient and he feels that an Oncotype test would be appropriate for her in addition to anti-hormonal treatment at the appropriate time.  The patient is presenting with early stage disease and with the above treatment I believe that she would carry a fairly low risk for recurrence if surgery goes as expected. She does have a large T1 C. tumor and this may potentially convert to a T2 tumor on final pathology which may indicate a higher recurrence risk. However, the patient indicated a very strong preference to  proceed with adjuvant radiotherapy.  I therefore discussed with her a potential  four-week course of adjuvant radiotherapy. We discussed the rationale of this treatment as well as the potential side effects and risks. The patient had a number of questions which were answered. She does have some comorbidities and we discussed in detail potential methods to reduce the radiation dose to the heart with such techniques for instance as breath-hold. She also does have some lung disease so we would need to see how she does in terms of being able to comply with this technique. Alternative options also exist such as IMRT.  We also discussed the potential interaction between radiotherapy and reconstruction. She wishes to proceed with the above plan and understands that reconstruction would be considered at a later date after she recovers from her treatment.   PLAN: The patient will proceed with lumpectomy and sentinel lymph node biopsy including removal of the sub-glandular implant. At some point she appears interested in bilateral reconstruction. I look forward to seeing the patient back postoperatively to review her case at that time which ideally would be sent after her Oncotype score returns and a decision has been made with regards to chemotherapy.    I spent 60 minutes minutes face to face with the patient and more than 50% of that time was spent in counseling and/or coordination of care.    ________________________________   Radene Gunning, MD, PhD

## 2013-04-25 ENCOUNTER — Other Ambulatory Visit: Payer: Medicare Other

## 2013-04-25 ENCOUNTER — Encounter: Payer: Medicare Other | Admitting: Genetic Counselor

## 2013-04-26 ENCOUNTER — Telehealth: Payer: Self-pay | Admitting: *Deleted

## 2013-04-26 ENCOUNTER — Telehealth: Payer: Self-pay | Admitting: Neurology

## 2013-04-26 DIAGNOSIS — F514 Sleep terrors [night terrors]: Secondary | ICD-10-CM

## 2013-04-26 MED ORDER — CLONAZEPAM 0.25 MG PO TBDP: 0.5000 mg | ORAL_TABLET | ORAL | Status: DC

## 2013-04-26 NOTE — Telephone Encounter (Signed)
Rx Sent  

## 2013-04-26 NOTE — Telephone Encounter (Signed)
i changed the prescription  to 0.5 reg tabs clonazepam. Fax to [facility], please.

## 2013-04-26 NOTE — Telephone Encounter (Signed)
Patient has been diagnosed  With breast-cancer.  Left breast , early stage. She has 2 sisters with breast cancer.

## 2013-04-26 NOTE — Telephone Encounter (Signed)
Patient asked to have klonopin order faxed to [facility].

## 2013-04-26 NOTE — Telephone Encounter (Signed)
Request for surgical clearance received in our office from Dr. Carolynne Edouard at [facility]. Per Dr. Clifton James pt will need office visit prior to being cleared.  I have scheduled this for first available appt --April 28, 2013 at 9:10 with Tereso Newcomer, PA. I have left message for pt to call our office regarding the need for clearance appt.

## 2013-04-27 ENCOUNTER — Telehealth: Payer: Self-pay | Admitting: *Deleted

## 2013-04-27 ENCOUNTER — Encounter (INDEPENDENT_AMBULATORY_CARE_PROVIDER_SITE_OTHER): Payer: Self-pay | Admitting: Surgery

## 2013-04-27 ENCOUNTER — Ambulatory Visit (INDEPENDENT_AMBULATORY_CARE_PROVIDER_SITE_OTHER): Payer: Medicare Other | Admitting: Surgery

## 2013-04-27 VITALS — BP 126/74 | HR 88 | Temp 99.7°F | Resp 18 | Ht 64.0 in | Wt 162.6 lb

## 2013-04-27 DIAGNOSIS — C50919 Malignant neoplasm of unspecified site of unspecified female breast: Secondary | ICD-10-CM

## 2013-04-27 DIAGNOSIS — C50912 Malignant neoplasm of unspecified site of left female breast: Secondary | ICD-10-CM

## 2013-04-27 NOTE — Telephone Encounter (Signed)
New Problem:    Patient called in wanting to change her appointment because she is seeing another Dr.'s office this day.  Patient was dissatisfied with next availability offered.  Patient stated that she is going to go ahead and have her surgery without being seen by our office if she can.  Please call back.

## 2013-04-27 NOTE — Telephone Encounter (Signed)
Attempted to call patient again at 3:16 pm; no answer

## 2013-04-27 NOTE — Telephone Encounter (Signed)
no answer on pt's home #; lmptcb on daughter's cell # to verify appt made last night for surgery clearance with Bing Neighbors. [facility] 04/28/13 @ 9:10 am;

## 2013-04-27 NOTE — Telephone Encounter (Signed)
Spoke with patient who states that she cannot come in at scheduled time tomorrow for surgical clearance appointment.  Patient states she is available in the afternoon but not in the morning.  I informed patient that I would have to talk with Danielle Rankin, Natale Lay nurse to discuss availability tomorrow afternoon and that I would call patient back.  I Left message for patient to call back regarding rescheduling appointment.

## 2013-04-27 NOTE — Patient Instructions (Signed)
We will schedule surgery to remove the left breast cancer and we will need to remove the breast implant and capsule at the same time.

## 2013-04-27 NOTE — Telephone Encounter (Signed)
no answer on pt's home #; lmptcb on daughter's cell # to verify appt made last night for surgery clearance with Scott W. PAC 04/28/13 @ 9:10 am;  

## 2013-04-27 NOTE — Progress Notes (Signed)
NAME: [name] DOB: October 26, 1940 MRN: [id number]                                                                                      DATE: 04/27/2013  PCP: [name], MD Referring Provider: Pecola Lawless, MD  IMPRESSION:  Left breast cancer, lower inner quadrant, possibly involving breast implant capsule  PLAN:   I have explained the pathophysiology and staging of breast cancer with particular attention to her exact situation. We discussed the multidisciplinary approach to breast cancer which often includes both medical and radiation oncology consultations.  We also discussed surgical options for the treatment of breast cancer including lumpectomy and mastectomy with possible reconstructive surgery. In addition we talked about the evaluation and management of lymph nodes including a description of sentinel lymph node biopsy and axillary dissections. We reviewed potential complications and risks including bleeding, infection, numbness,  lymphedema, and the potential need for additional surgery.  She understands that for patients who are candidate for lumpectomy or mastectomy there is an equal survival rate with either technique, but a slightly higher local recurrence rate with lumpectomy. In addition she knows that a lumpectomy usually requires postoperative radiation as part of the management of the breast cancer.  We have discussed the likely postoperative course and plans for followup.  The patient has pending cardiology and pulmonology consultations. In addition she is going to see a Engineer, petroleum regarding subsequent reconstruction and replacement of her implant after surgery. She is also going to see a genetic counselor because of her high-risk family history.she is very opposed to the concept of a mastectomy. I spent a long period of time reviewing all these issues with her and her daughter and I think all questions have been answered.                  CC:  Chief  Complaint  Patient presents with  . Breast Cancer    eval nw breast ca    HPI:  [name] is a 72 y.o.  female who presents for discussion of surgery. She is recently diagnosed with a left breast cancer lower inner quadrant which is adjacent to the implant from her breast augmentation done 39 years ago. She is seen by surgery, medical oncology, and radiation oncology last week in the breast multidisciplinary clinic. Their recommendation was for a wire localized lumpectomy, removal of the implant and capsule, and a sentinel node. She originally was to see me but ended up being seen in the multidisciplinary clinic. She would like for me to do her surgery so she came to the office today. All of the pertinent clinical information are in the notes of Dr. Chevis Pretty and so not redictated into this note I have reviewed all of these.  PMH:  has a past medical history of CAD (coronary artery disease); Other emphysema; Unspecified arthropathy, shoulder region; Gastric ulcer, unspecified as acute or chronic, without mention of hemorrhage, perforation, or obstruction; Benign paroxysmal positional vertigo; COPD (chronic obstructive pulmonary disease); Hypertension; Diverticula of colon; Osteopenia; Personal history of colonic polyps (2007); Diverticulitis; Hyperlipemia; Fracture of ankle, bimalleolar, right, closed; Hypoxemia; C. difficile  diarrhea; Varicose vein; Asthma; GERD (gastroesophageal reflux disease); Hepatitis (1989); Arthritis; CVA (cerebral vascular accident); TIA (transient ischemic attack) (04/2008; 08/21/2012); On home oxygen therapy; Ischemic cardiomyopathy; Night terrors, adult; Stroke, acute, embolic; and Breast cancer.  PSH:   has past surgical history that includes Total shoulder arthroplasty (2012); ORIF ankle fracture (2007); martial megeti (1610'R); Varicose vein surgery (6045; 1985); Total shoulder replacement (12/05/2010); Abdominal hysterectomy; Augmentation mammaplasty (1975); Coronary  angioplasty with stent (2010); Coronary angioplasty (1990); Colon surgery (07/07/2011); and Appendectomy (1960).  ALLERGIES:   Allergies  Allergen Reactions  . Escitalopram Oxalate Shortness Of Breath, Diarrhea and Nausea And Vomiting    Hot flashes, dizziness  . Indomethacin Other (See Comments)    "made me very very dizzy and made me have vertigo" (08/31/2012)  . Codeine Nausea And Vomiting    "I can take codeine in my cough medicine" (08/31/2012)  . Cephalexin Other (See Comments)    Complicated by C dif colitis  . Sulfonamide Derivatives Other (See Comments)    Drug induced hepatitis in 1989    MEDICATIONS: Current outpatient prescriptions:albuterol (PROVENTIL HFA;VENTOLIN HFA) 108 (90 BASE) MCG/ACT inhaler, Inhale 2 puffs into the lungs every 6 (six) hours as needed. For shortness of breath, Disp: , Rfl: ;  alendronate (FOSAMAX) 70 MG tablet, Take 1 tablet (70 mg total) by mouth every 7 (seven) days. Take with a full glass of water on an empty stomach., Disp: 12 tablet, Rfl: 1 aspirin EC 81 MG tablet, Take 1 tablet (81 mg total) by mouth daily., Disp: , Rfl: ;  Avocado Oil OIL, daily., Disp: , Rfl: ;  benzonatate (TESSALON) 200 MG capsule, Take 1 capsule (200 mg total) by mouth 3 (three) times daily as needed for cough., Disp: 270 capsule, Rfl: 0;  calcium-vitamin D (OSCAL WITH D) 500-200 MG-UNIT per tablet, Take 0.5 tablets by mouth 2 (two) times daily. , Disp: , Rfl:  Casanthranol-Docusate Sodium 30-100 MG CAPS, Take 1 tablet by mouth daily. For constipation, Disp: , Rfl: ;  Cholecalciferol (VITAMIN D3) 2000 UNITS TABS, Take 2 tablets by mouth 2 (two) times daily. , Disp: , Rfl: ;  clonazePAM (KLONOPIN) 0.25 MG disintegrating tablet, Take 2 tablets (0.5 mg total) by mouth 1 day or 1 dose., Disp: 90 tablet, Rfl: 3 clopidogrel (PLAVIX) 75 MG tablet, Take 1 tablet (75 mg total) by mouth daily., Disp: 90 tablet, Rfl: 4;  Cranberry-Olive Leaf (URINARY TRACT HEALTH) 365-50 MG CAPS, Take 1 capsule  by mouth daily. , Disp: , Rfl: ;  Fluticasone-Salmeterol (ADVAIR DISKUS) 250-50 MCG/DOSE AEPB, Inhale 1 puff into the lungs 2 (two) times daily., Disp: 2 each, Rfl: 0 gabapentin (NEURONTIN) 300 MG capsule, Take 1 capsule (300 mg total) by mouth Nightly., Disp: 90 capsule, Rfl: 3;  Garlic (ODORLESS GARLIC) 1250 MG TABS, Take 1 tablet by mouth every other day. , Disp: , Rfl: ;  HYDROcodone-homatropine (HYCODAN) 5-1.5 MG/5ML syrup, , Disp: , Rfl: ;  L-Lysine 500 MG TABS, Take 1 tablet by mouth every other day. , Disp: , Rfl: ;  loratadine (CLARITIN) 10 MG tablet, Take 10 mg by mouth daily., Disp: , Rfl:  magnesium 30 MG tablet, Take 30 mg by mouth 2 (two) times daily., Disp: , Rfl: ;  metoprolol succinate (TOPROL-XL) 25 MG 24 hr tablet, Take 1/2 tab daily, Disp: , Rfl: ;  Multiple Vitamin (MULTIVITAMIN) capsule, Take 0.5 capsules by mouth 2 (two) times daily. , Disp: , Rfl:  nitroGLYCERIN (NITROSTAT) 0.4 MG SL tablet, Place 1 tablet (0.4 mg total)  under the tongue every 5 (five) minutes as needed for chest pain (up to 3 doses)., Disp: 25 tablet, Rfl: 4;  Omega-3 Fatty Acids (FISH OIL) 1200 MG CAPS, Take 1 capsule by mouth every evening. , Disp: , Rfl: ;  predniSONE (DELTASONE) 10 MG tablet, , Disp: , Rfl: ;  Probiotic Product (PROBIOTIC DAILY PO), Take by mouth., Disp: , Rfl:  simvastatin (ZOCOR) 40 MG tablet, Take 1 tablet (40 mg total) by mouth at bedtime., Disp: 90 tablet, Rfl: 3;  tiotropium (SPIRIVA HANDIHALER) 18 MCG inhalation capsule, Place 1 capsule (18 mcg total) into inhaler and inhale daily., Disp: 20 capsule, Rfl: 0;  vitamin B-12 (CYANOCOBALAMIN) 1000 MCG tablet, Take 1,000 mcg by mouth daily.  , Disp: , Rfl:   ROS: She  EXAM:   VITAL SIGNS:  BP 126/74  Pulse 88  Temp(Src) 99.7 F (37.6 C) (Temporal)  Resp 18  Ht 5\' 4"  (1.626 m)  Wt 162 lb 9.6 oz (73.755 kg)  BMI 27.9 kg/m2  GENERAL:  The patient is alert, oriented, and generally healthy-appearing, NAD. Mood and affect are  normal.  HEENT:  The head is normocephalic, the eyes nonicteric, the pupils were round regular and equal. EOMs are normal. Pharynx normal. Dentition good.  NECK:  The neck is supple and there are no masses or thyromegaly.  LUNGS: Normal respirations and clear to auscultation.  HEART: Regular rhythm, with no murmurs rubs or gallops. Pulses are intact carotid dorsalis pedis and posterior tibial. No significant varicosities are noted.  BREASTS:  she is status post bilateral augmentation implants. There is no palpable mass on either side. There is no lymphatic abnormality either.  ABDOMEN: Soft, flat, and nontender. No masses or organomegaly is noted. No hernias are noted. Bowel sounds are normal.  EXTREMITIES:  Good range of motion, no edema.   DATA REVIEWED:  I have reviewed the consultation notes by the surgeon, medical and radiation oncologist, MRI reports and films, mammogram films and reports, pathology report, and other notes in the electronic medical record    Ferrel Simington J 04/27/2013  CC: Pecola Lawless, MD, [name], MD

## 2013-04-28 ENCOUNTER — Ambulatory Visit: Payer: Medicare Other

## 2013-04-28 ENCOUNTER — Ambulatory Visit: Payer: Medicare Other | Admitting: Radiation Oncology

## 2013-04-28 ENCOUNTER — Ambulatory Visit: Payer: Medicare Other | Admitting: Physician Assistant

## 2013-04-28 ENCOUNTER — Telehealth: Payer: Self-pay | Admitting: Cardiovascular Disease

## 2013-04-28 NOTE — Telephone Encounter (Signed)
Follow Up     Pt calling returning call from yesterday. Please call back.

## 2013-04-28 NOTE — Telephone Encounter (Signed)
Appt has been scheduled for May 13, 2013 with Tereso Newcomer, PA per call from pt

## 2013-04-30 ENCOUNTER — Encounter: Payer: Self-pay | Admitting: *Deleted

## 2013-04-30 NOTE — Progress Notes (Signed)
Mailed after appt letter to pt. 

## 2013-05-02 ENCOUNTER — Ambulatory Visit (INDEPENDENT_AMBULATORY_CARE_PROVIDER_SITE_OTHER): Payer: Medicare Other | Admitting: Pulmonary Disease

## 2013-05-02 ENCOUNTER — Encounter: Payer: Self-pay | Admitting: Pulmonary Disease

## 2013-05-02 VITALS — BP 120/62 | HR 86 | Temp 97.5°F | Ht 64.5 in | Wt 165.2 lb

## 2013-05-02 DIAGNOSIS — J449 Chronic obstructive pulmonary disease, unspecified: Secondary | ICD-10-CM

## 2013-05-02 NOTE — Assessment & Plan Note (Addendum)
Cleared for surgery from pulmonary standpoint with due risk for general anesthesia Take advair & spiriva morning of surgery Will ask anesthesia to give you IV solucortef 100 q 8h peri-operatively x 5 doses with rapid taper to pred 20 mg x 3ds ( since she has received multiple steroid tapers in the past year) Albuterol nebs q 6h peri-op  Oxygen during sleep Early mobilisation & DVT prophylaxis as would be routine Note that she had AF-RVR durig prior surgery -cardiac clearance awaited

## 2013-05-02 NOTE — Progress Notes (Signed)
Subjective:    Patient ID: [name], female    DOB: 1941-10-04, 72 y.o.   MRN: [id number]  HPI  PCP - [name]   72/F, ex smoker, realtor with Gold C COPD FEV1 1.0 L (48%), on nocturnal O2 since 10/10 for FU.  She smoked for 40 years and quit in 1999. Advair worked better than symbicort.  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  Cardiac cath 08/31/12 with severe stenosis proximal LAD now s/p drug eluting stent proximal LAD   Reviewed [facility] from [facility] where she went for a second opinion - rehab & Theophylline advised  Remains reluctant to enroll in pulm rehab   01/25/2013 Had recent face lift -non invasive . No sedation used.   05/02/2013  pt is needing surgical clearance to do surgery Left breast. Pt was DX w/ breast cancer in June. Dr. Jamey Ripa will do surgery. Pt reports has not been doing well. No cough , no wheezing, no chest tx per pt.  Pt uses oxygen at bedtime. 2-3 steroid flares per year requiring steroid bursts - last 4/14 Otherwise she seems to be at her baseline Prior post op courses reviewed as noted above  Past Medical History  Diagnosis Date  . CAD (coronary artery disease)     a. ant MI 63 tx with TPA/PTCA. b. Prior stenting procedures including RCA stent, DES to LCx 04/2011. c. [place] -> DES  to prox LAD 08/2012.   Marland Kitchen Other emphysema   . Unspecified arthropathy, shoulder region   . Gastric ulcer, unspecified as acute or chronic, without mention of hemorrhage, perforation, or obstruction   . Benign paroxysmal positional vertigo   . COPD (chronic obstructive pulmonary disease)   . Hypertension   . Diverticula of colon   . Osteopenia    . Personal history of colonic polyps 2007    tubular adenoma high grade dysplasia  . Diverticulitis   . Hyperlipemia   . Fracture of ankle, bimalleolar, right, closed   . Hypoxemia   . C. difficile diarrhea   . Varicose vein   . Asthma   . GERD (gastroesophageal reflux disease)   . Hepatitis 1989  . Arthritis     "both shoulders"   . TIA (transient ischemic attack) 04/2008; 08/21/2012    "slight droop left lower lip after 08/21/12 TIA"  . On home oxygen therapy   . Ischemic cardiomyopathy     a. EF 45-50% 2011. b. EF 35-40% by cath 09/01/12.  . Night terrors, adult     dr Pearlean Brownie referred  for PSG in 2011, and again in 2013   . CVA (cerebral vascular accident)   . Stroke, acute, embolic     Nov 2013 , [facility] , was treated with TPA.   . Breast cancer 04/08/13 bx    left     Review of Systems neg for any significant sore throat, dysphagia, itching, sneezing, nasal congestion or excess/ purulent secretions, fever, chills, sweats, unintended wt loss, pleuritic or exertional cp, hempoptysis, orthopnea pnd or change in chronic leg swelling. Also denies presyncope, palpitations, heartburn, abdominal pain, nausea, vomiting, diarrhea or change in bowel or urinary habits,  dysuria,hematuria, rash, arthralgias, visual complaints, headache, numbness weakness or ataxia.      Objective:   Physical Exam  Gen. Pleasant, well-nourished, in no distress, normal affect ENT - no lesions, no post nasal drip Neck: No JVD, no thyromegaly, no carotid bruits Lungs: no use of accessory muscles, no dullness to percussion,decreased without rales or rhonchi  Cardiovascular: Rhythm regular, heart sounds  normal, no murmurs or gallops, no peripheral edema Abdomen: soft and non-tender, no hepatosplenomegaly, BS normal. Musculoskeletal: No deformities, no cyanosis or clubbing Neuro:  alert, non focal       Assessment & Plan:

## 2013-05-02 NOTE — Patient Instructions (Addendum)
Cleared for surgery with due risk Take advair & spiriva morning of surgery Will ask anesthesia to give you IV steroids peri-op Albuterol nebs q 6h peri-op  Oxygen during sleep

## 2013-05-03 ENCOUNTER — Encounter: Payer: Self-pay | Admitting: Physician Assistant

## 2013-05-03 ENCOUNTER — Ambulatory Visit (INDEPENDENT_AMBULATORY_CARE_PROVIDER_SITE_OTHER): Payer: Medicare Other | Admitting: Physician Assistant

## 2013-05-03 ENCOUNTER — Telehealth: Payer: Self-pay | Admitting: *Deleted

## 2013-05-03 ENCOUNTER — Encounter: Payer: Self-pay | Admitting: Radiation Oncology

## 2013-05-03 VITALS — BP 120/70 | HR 77 | Ht 64.0 in | Wt 164.0 lb

## 2013-05-03 DIAGNOSIS — E785 Hyperlipidemia, unspecified: Secondary | ICD-10-CM

## 2013-05-03 DIAGNOSIS — C50919 Malignant neoplasm of unspecified site of unspecified female breast: Secondary | ICD-10-CM | POA: Insufficient documentation

## 2013-05-03 DIAGNOSIS — I2510 Atherosclerotic heart disease of native coronary artery without angina pectoris: Secondary | ICD-10-CM

## 2013-05-03 DIAGNOSIS — C50912 Malignant neoplasm of unspecified site of left female breast: Secondary | ICD-10-CM

## 2013-05-03 DIAGNOSIS — Z01810 Encounter for preprocedural cardiovascular examination: Secondary | ICD-10-CM

## 2013-05-03 DIAGNOSIS — I10 Essential (primary) hypertension: Secondary | ICD-10-CM

## 2013-05-03 DIAGNOSIS — R079 Chest pain, unspecified: Secondary | ICD-10-CM

## 2013-05-03 DIAGNOSIS — I2589 Other forms of chronic ischemic heart disease: Secondary | ICD-10-CM

## 2013-05-03 NOTE — Patient Instructions (Addendum)
Your physician has requested that you have a lexiscan myoview. For further information please visit [email or web address] Please follow instruction sheet, as given.  Your physician wants you to follow-up in: 3 months with Dr Clifton James. (October 2014). You will receive a reminder letter in the mail two months in advance. If you don't receive a letter, please call our office to schedule the follow-up appointment.

## 2013-05-03 NOTE — Telephone Encounter (Signed)
On 04/21/13 [name] sent message that patient saw Dr. Mitzi Hansen in Breast Clinic, but patient wanted to see Dr. Michell Heinrich.

## 2013-05-03 NOTE — Progress Notes (Addendum)
Location of Breast Cancer:left lower inner quadrant 8 o ' clock position ,possibly involving breast implant capsule (breast b/l   Augmentation 39 years ago)    Histology per Pathology Report: 04/08/13: Breast, left, needle core biopsy, mass, 8 o'clock- INVASIVE DUCTAL CARCINOMA WITH EXTRACELLULAR MUCIN.  Receptor Status: ER(+), PR (+), Her2-neu (-)  Did patient present with symptoms : was found on routine screening mammogram  Past/Anticipated interventions by surgeon, if any: 04/27/13 discussed lumpectomy with Dr. Cyndia Bent pending  Cardiology/pulmonary consultations   Past/Anticipated interventions by medical oncology, if any: Chemotherapy seen in Multidisciplinary clinic 04/20/13, genetic counseling, high risk family hx; next office visit 05/20/13 with Dr.Magrinat  Lymphedema issues, if any:  no Pain issues, if any: yes in right shoulder a 5 on 1-10 pain scale, has night terrors at night takes klonopin and gabapentin for this SAFETY ISSUES:  Prior radiation? no  Pacemaker/ICD? no  Possible current pregnancy?no  Is the patient on methotrexate? 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

## 2013-05-03 NOTE — Progress Notes (Signed)
[address] [place], [place]  [address] Phone: [phone] Fax:  [phone]  Date:  05/03/2013   ID:  DOREAN [name], DOB 1941/01/03, MRN [id number]  PCP:  [name], MD  Cardiologist:  Dr. [name]     History of Present Illness: [name] is a 72 y.o. female who returns for surgical clearance.  She needs surgery for recently dx breast cancer.  She has a history of CAD, status post multiple prior stenting procedures, ischemic cardiomyopathy, severe COPD and prior strokes, HTN, HL, GERD.  She suffered an anterior MI in 1990 treated with TPA followed by angioplasty. She then had a DES placed in the circumflex in 04/2009. She suffered a stroke in 2009 treated with TPA. She was diagnosed with pericarditis while [place] the beach in 07[place]2012. 2 weeks later, she was treated again with TPA for a stroke in the hospital in [place], [place]. She was discharged on Plavix. LHC 08/2012 done for chest pain: Proximal LAD 90%, diagonal 30%, circumflex stent patent, mid RCA 50%, EF 35-40%. PCI: Promus DES to proximal LAD. Echo 12/13: EF 40-45% apical hypo-to akinesis.  Last seen by Dr. [name] 11/2012.     She notes recent development of CP.  She is severely limited by her COPD.  She notes she is short of breath "all the time."  "It gets worse every year."  She notes CP that seems to occur at rest.  She cannot tell me what it feels like.  She takes NTG with relief.  Taking NTG is unusual for her.  She denies any radiation symptoms or assoc symptoms.  No syncope.  No orthopnea, PND, edema.    Labs (7/14):  K 3.7, Cr 0.9, ALT 20, Hgb 13.5   Wt Readings from Last 3 Encounters:  05/02/13 165 lb 3.2 oz (74.934 kg)  04/27/13 162 lb 9.6 oz (73.755 kg)  04/20/13 162 lb 14.4 oz (73.891 kg)     Past Medical History  Diagnosis Date  . CAD (coronary artery disease)     a. ant MI 85 tx with TPA/PTCA. b. Prior stenting procedures including RCA stent, DES to LCx  04/2011. c. [place] -> DES  to prox LAD 08/2012.   Marland Kitchen Other emphysema   . Unspecified arthropathy, shoulder region   . Gastric ulcer, unspecified as acute or chronic, without mention of hemorrhage, perforation, or obstruction   . Benign paroxysmal positional vertigo   . COPD (chronic obstructive pulmonary disease)   . Hypertension   . Diverticula of colon   . Osteopenia   . Personal history of colonic polyps 2007    tubular adenoma high grade dysplasia  . Diverticulitis   . Hyperlipemia   . Fracture of ankle, bimalleolar, right, closed   . Hypoxemia   . C. difficile diarrhea   . Varicose vein   . Asthma   . GERD (gastroesophageal reflux disease)   . Hepatitis 1989  . Arthritis     "both shoulders"   . TIA (transient ischemic attack) 04/2008; 08/21/2012    "slight droop left lower lip after 08/21/12 TIA"  . On home oxygen therapy   . Ischemic cardiomyopathy     a. EF 45-50% 2011. b. EF 35-40% by cath 09/01/12.  . Night terrors, adult     dr Pearlean Brownie referred  for PSG in 2011, and again in 2013   . CVA (cerebral vascular accident)   . Stroke, acute, embolic     Nov 2013 ,  [place]  [facility] , was treated with TPA.   . Breast cancer 04/08/13 bx    left    Current Outpatient Prescriptions  Medication Sig Dispense Refill  . albuterol (PROVENTIL HFA;VENTOLIN HFA) 108 (90 BASE) MCG/ACT inhaler Inhale 2 puffs into the lungs every 6 (six) hours as needed. For shortness of breath      . alendronate (FOSAMAX) 70 MG tablet Take 1 tablet (70 mg total) by mouth every 7 (seven) days. Take with a full glass of water on an empty stomach.  12 tablet  1  . aspirin EC 81 MG tablet Take 1 tablet (81 mg total) by mouth daily.      Marland Kitchen Avocado Oil OIL daily.      . benzonatate (TESSALON) 200 MG capsule Take 1 capsule (200 mg total) by mouth 3 (three) times daily as needed for cough.  270 capsule  0  . calcium-vitamin D (OSCAL WITH D) 500-200 MG-UNIT per tablet Take 0.5 tablets by mouth 2 (two) times daily.         [name] Sodium 30-100 MG CAPS Take 1 tablet by mouth daily. For constipation      . Cholecalciferol (VITAMIN D3) 2000 UNITS TABS Take 2 tablets by mouth 2 (two) times daily.       . clonazePAM (KLONOPIN) 0.25 MG disintegrating tablet Take 2 tablets (0.5 mg total) by mouth 1 day or 1 dose.  90 tablet  3  . clopidogrel (PLAVIX) 75 MG tablet Take 1 tablet (75 mg total) by mouth daily.  90 tablet  4  . Cranberry-Olive Leaf (URINARY TRACT HEALTH) 365-50 MG CAPS Take 1 capsule by mouth daily.       . Fluticasone-Salmeterol (ADVAIR DISKUS) 250-50 MCG/DOSE AEPB Inhale 1 puff into the lungs 2 (two) times daily.  2 each  0  . gabapentin (NEURONTIN) 300 MG capsule Take 1 capsule (300 mg total) by mouth Nightly.  90 capsule  3  . Garlic (ODORLESS GARLIC) 1250 MG TABS Take 1 tablet by mouth every other day.       Marland Kitchen HYDROcodone-homatropine (HYCODAN) 5-1.5 MG/5ML syrup       . L-Lysine 500 MG TABS Take 1 tablet by mouth every other day.       . loratadine (CLARITIN) 10 MG tablet Take 10 mg by mouth daily.      . magnesium 30 MG tablet Take 30 mg by mouth 2 (two) times daily.      . metoprolol succinate (TOPROL-XL) 25 MG 24 hr tablet Take 1/2 tab daily      . Multiple Vitamin (MULTIVITAMIN) capsule Take 0.5 capsules by mouth 2 (two) times daily.       . nitroGLYCERIN (NITROSTAT) 0.4 MG SL tablet Place 1 tablet (0.4 mg total) under the tongue every 5 (five) minutes as needed for chest pain (up to 3 doses).  25 tablet  4  . Omega-3 Fatty Acids (FISH OIL) 1200 MG CAPS Take 1 capsule by mouth every evening.       . predniSONE (DELTASONE) 10 MG tablet Per pt going to pick up pred pack for her back      . Probiotic Product (PROBIOTIC DAILY PO) Take by mouth.      . simvastatin (ZOCOR) 40 MG tablet Take 1 tablet (40 mg total) by mouth at bedtime.  90 tablet  3  . tiotropium (SPIRIVA HANDIHALER) 18 MCG inhalation capsule Place 1 capsule (18 mcg total) into inhaler and inhale daily.  20 capsule  0  .  vitamin B-12 (CYANOCOBALAMIN) 1000 MCG tablet Take 1,000 mcg by mouth daily.         No current facility-administered medications for this visit.    Allergies:    Allergies  Allergen Reactions  . Escitalopram Oxalate Shortness Of Breath, Diarrhea and Nausea And Vomiting    Hot flashes, dizziness  . Indomethacin Other (See Comments)    "made me very very dizzy and made me have vertigo" (08/31/2012)  . Codeine Nausea And Vomiting    "I can take codeine in my cough medicine" (08/31/2012)  . Cephalexin Other (See Comments)    Complicated by C dif colitis  . Sulfonamide Derivatives Other (See Comments)    Drug induced hepatitis in 1989    Social History:  The patient  reports that she quit smoking about 15 years ago. Her smoking use included Cigarettes. She has a 40 pack-year smoking history. She has never used smokeless tobacco. She reports that she drinks about 1.8 ounces of alcohol per week. She reports that she does not use illicit drugs.   ROS:  Please see the history of present illness.      All other systems reviewed and negative.   PHYSICAL EXAM: VS:  BP 120/70  Pulse 77  Ht 5\' 4"  (1.626 m)  Wt 164 lb (74.39 kg)  BMI 28.14 kg/m2 Well nourished, well developed, in no acute distress HEENT: normal Neck: no JVD Cardiac:  normal S1, S2; RRR; no murmur Lungs:  clear to auscultation bilaterally, no wheezing, rhonchi or rales Abd: soft, nontender, no hepatomegaly Ext: no edema Skin: warm and dry Neuro:  CNs 2-12 intact, no focal abnormalities noted  EKG:  NSR, HR 77, normal axis, NSSTTW changes     ASSESSMENT AND PLAN:  1. Surgical Clearance:  She has had recent onset of CP that is responsive to NTG.  She also is just 8 mos out from placement of a DES to her LAD.  This was not in the setting of ACS.  She tells me her surgeon will do her surgery while she is taking ASA.  I reviewed her case with Dr. [name] by phone today.  She will need to undergo a Lexiscan  Myoview for risk stratification prior to clearing her for surgery.  She will be a slightly increased risk for stent thrombosis while off dual antiplatelet Rx.  However, with a 3rd generation stent, endothelialization has likely occurred.  Her risk will be reduced with continuation of ASA throughout the surgical period.  We recommend she remain on ASA without interruption.  Plavix should be resumed post op when safe.  She should remain on her beta blocker throughout the peri-op period as well.  Our service will be available as needed.  We will be in touch with her surgeon once we have the results of her stress test.   2. CAD:  With recent onset of CP, will proceed with a Lexiscan Myoview as noted.  Continue ASA, Plavix, statin. 3. Ischemic Cardiomyopathy:  Volume stable.  Continue beta blocker.  Consider adding ACE inhibitor over time.   4. Hypertension:  Controlled.  Continue current therapy.  5. Hyperlipidemia:  Continue statin. 6. COPD:  She has been seen by pulmonology already for preop evaluation.   7. Breast CA:  Plan per surgery and oncology. 8. Disposition:  F/u with Dr. [name] in 3 mos.   Signed, Tereso Newcomer, PA-C  05/03/2013 2:37 PM

## 2013-05-04 ENCOUNTER — Ambulatory Visit
Admission: RE | Admit: 2013-05-04 | Discharge: 2013-05-04 | Disposition: A | Discharge status: Home / Self Care | Payer: Medicare Other | Source: Ambulatory Visit | Attending: Radiation Oncology | Admitting: Radiation Oncology

## 2013-05-04 ENCOUNTER — Encounter: Payer: Self-pay | Admitting: Radiation Oncology

## 2013-05-04 VITALS — Ht 64.0 in | Wt 165.0 lb

## 2013-05-04 VITALS — BP 121/70 | HR 94 | Temp 97.8°F | Resp 24 | Ht 64.0 in | Wt 165.0 lb

## 2013-05-04 DIAGNOSIS — Z170 Estrogen receptor positive status [ER+]: Secondary | ICD-10-CM | POA: Insufficient documentation

## 2013-05-04 DIAGNOSIS — Z978 Presence of other specified devices: Secondary | ICD-10-CM | POA: Insufficient documentation

## 2013-05-04 DIAGNOSIS — J4489 Other specified chronic obstructive pulmonary disease: Secondary | ICD-10-CM | POA: Insufficient documentation

## 2013-05-04 DIAGNOSIS — I2510 Atherosclerotic heart disease of native coronary artery without angina pectoris: Secondary | ICD-10-CM | POA: Insufficient documentation

## 2013-05-04 DIAGNOSIS — J449 Chronic obstructive pulmonary disease, unspecified: Secondary | ICD-10-CM | POA: Insufficient documentation

## 2013-05-04 DIAGNOSIS — C50919 Malignant neoplasm of unspecified site of unspecified female breast: Secondary | ICD-10-CM | POA: Insufficient documentation

## 2013-05-04 DIAGNOSIS — C50312 Malignant neoplasm of lower-inner quadrant of left female breast: Secondary | ICD-10-CM

## 2013-05-04 DIAGNOSIS — Z8673 Personal history of transient ischemic attack (TIA), and cerebral infarction without residual deficits: Secondary | ICD-10-CM | POA: Insufficient documentation

## 2013-05-04 DIAGNOSIS — Z7902 Long term (current) use of antithrombotics/antiplatelets: Secondary | ICD-10-CM | POA: Insufficient documentation

## 2013-05-04 DIAGNOSIS — Z7982 Long term (current) use of aspirin: Secondary | ICD-10-CM | POA: Insufficient documentation

## 2013-05-04 DIAGNOSIS — C50912 Malignant neoplasm of unspecified site of left female breast: Secondary | ICD-10-CM

## 2013-05-04 HISTORY — DX: Allergy, unspecified, initial encounter: T78.40XA

## 2013-05-04 NOTE — Progress Notes (Signed)
Radiation Oncology         [phone]) [phone] ________________________________  Initial outpatient Consultation - Date: 05/04/2013   Name: [name] MRN: [id number]   DOB: 1941/02/06  REFERRING PHYSICIAN: Robyne Askew, MD  DIAGNOSIS: The encounter diagnosis was Breast cancer, left.(T1cN0)  HISTORY OF PRESENT ILLNESS::[name] is a 72 y.o. female  who underwent a screening mammogram in June of this year. An abnormality was seen in the medial aspect of the left breast. Ultrasound confirmed a regular suspicious mass in the 8:00 position of the left breast. A biopsy was performed on 04/08/2013 which showed an invasive ductal carcinoma with mucin. This was ER positive at 100% PR +86% with a Ki-67 of 7%. HER-2 was negative. An MRI of the bilateral breasts on 04/17/2013 showed a 1.7 x 1.7 x 1.7 cm mass in the 8:00 position of the left breast. It also showed this mass to a bite to the left subclavian dual her silicon implants. No abnormalities were seen the right breast. No abnormal appearing lymph nodes. She was seen by Dr. Mitzi Hansen and Dr. Carolynne Edouard as well as Dr. Darnelle Catalan in our multidisciplinary clinic. A breast conservation was recommended. She had a second opinion with Dr. Jamey Ripa. She has a significant history of COPD, TIAs and coronary artery disease having a stent placed in November of 2013. She is on aspirin and Plavix because of the stent. She has 2 sisters who were diagnosed breast cancer in their 30s and 34s. Genetic testing is pending. She has been cleared by her pulmonologist for surgery and is awaiting the results of the stress test which is scheduled on Monday for cardiac clearance. She is concerned about a family trip she has planned to [place] with her entire family children and grandchildren on October 10. She is seeing plastic surgery on Friday. Her implants are 72 years old and some glandular. She is bothered by getting them replaced a few years ago but did not want to pay out of  pocket. It she is interested in having them replaced at all possible. She would like to have this performed at the same time as her cancer surgery. She had menarche at age 60 and her first live birth 8. She is GX P3. She had a total of the hysterectomy and bilateral salpingo-oophorectomy at the age of 60. She took hormone replacement for 5 years. She is not on hormone replacement currently and quit many years ago.  PREVIOUS RADIATION THERAPY: No  PAST MEDICAL HISTORY:  has a past medical history of CAD (coronary artery disease); Other emphysema; Unspecified arthropathy, shoulder region; Gastric ulcer, unspecified as acute or chronic, without mention of hemorrhage, perforation, or obstruction; Benign paroxysmal positional vertigo; COPD (chronic obstructive pulmonary disease); Hypertension; Diverticula of colon; Osteopenia; Personal history of colonic polyps (2007); Diverticulitis; Hyperlipemia; Fracture of ankle, bimalleolar, right, closed; Hypoxemia; C. difficile diarrhea; Varicose vein; Asthma; GERD (gastroesophageal reflux disease); Hepatitis (1989); Arthritis; TIA (transient ischemic attack) (04/2008; 08/21/2012); On home oxygen therapy; Ischemic cardiomyopathy; Night terrors, adult; CVA (cerebral vascular accident); Stroke, acute, embolic; Breast cancer (04/08/13 bx); and Allergy.    PAST SURGICAL HISTORY: Past Surgical History  Procedure Laterality Date  . Total shoulder arthroplasty  2012    left  . Orif ankle fracture  2007    right  . Martial megeti  9562'Z    "bladder operation; not successful " (08/31/2012)  . Varicose vein surgery  1983; 1985    "both legs both times" (08/31/2012)  .  Total shoulder replacement  12/05/2010    Dr Rennis Chris  . Abdominal hysterectomy      TAH w/BSO  . Augmentation mammaplasty  1975  . Coronary angioplasty with stent placement  2010  . Coronary angioplasty  1990    "3 times" (08/31/2012)  . Colon surgery  07/07/2011    Dr. [name]  . Appendectomy  1960     FAMILY HISTORY:  Family History  Problem Relation Age of Onset  . Breast cancer Sister     x 2  . Colon cancer Neg Hx   . Diabetes Paternal Grandmother   . Uterine cancer Paternal Grandmother   . Lung cancer Paternal Grandmother   . Heart disease Mother   . Heart disease Father   . Pancreatic cancer Cousin   . Colon polyps Sister   . Diabetes Maternal Uncle   . Heart disease Maternal Grandfather   . Heart disease Paternal Grandfather   . Stroke Paternal Grandfather     SOCIAL HISTORY:  History  Substance Use Topics  . Smoking status: Former Smoker -- 1.00 packs/day for 40 years    Types: Cigarettes    Quit date: 10/19/1997  . Smokeless tobacco: Never Used  . Alcohol Use: 1.8 oz/week    3 Glasses of wine per week     Comment: occasionally    ALLERGIES: Escitalopram oxalate; Indomethacin; Codeine; Cephalexin; and Sulfonamide derivatives  MEDICATIONS:  Current Outpatient Prescriptions  Medication Sig Dispense Refill  . albuterol (PROVENTIL HFA;VENTOLIN HFA) 108 (90 BASE) MCG/ACT inhaler Inhale 2 puffs into the lungs every 6 (six) hours as needed. For shortness of breath      . alendronate (FOSAMAX) 70 MG tablet Take 1 tablet (70 mg total) by mouth every 7 (seven) days. Take with a full glass of water on an empty stomach.  12 tablet  1  . aspirin EC 81 MG tablet Take 1 tablet (81 mg total) by mouth daily.      Marland Kitchen Avocado Oil OIL daily.      . calcium-vitamin D (OSCAL WITH D) 500-200 MG-UNIT per tablet Take 0.5 tablets by mouth 2 (two) times daily.       . Cholecalciferol (VITAMIN D3) 2000 UNITS TABS Take 2 tablets by mouth 2 (two) times daily.       . clonazePAM (KLONOPIN) 0.25 MG disintegrating tablet Take 2 tablets (0.5 mg total) by mouth 1 day or 1 dose.  90 tablet  3  . clopidogrel (PLAVIX) 75 MG tablet Take 1 tablet (75 mg total) by mouth daily.  90 tablet  4  . Cranberry-Olive Leaf (URINARY TRACT HEALTH) 365-50 MG CAPS Take 1 capsule by mouth daily.       .  Fluticasone-Salmeterol (ADVAIR DISKUS) 250-50 MCG/DOSE AEPB Inhale 1 puff into the lungs 2 (two) times daily.  2 each  0  . gabapentin (NEURONTIN) 300 MG capsule Take 1 capsule (300 mg total) by mouth Nightly.  90 capsule  3  . Garlic (ODORLESS GARLIC) 1250 MG TABS Take 1 tablet by mouth every other day.       Marland Kitchen L-Lysine 500 MG TABS Take 1 tablet by mouth every other day.       . loratadine (CLARITIN) 10 MG tablet Take 10 mg by mouth daily.      . magnesium 30 MG tablet Take 30 mg by mouth 2 (two) times daily.      . metoprolol succinate (TOPROL-XL) 25 MG 24 hr tablet Take 1/2 tab daily      .  Multiple Vitamin (MULTIVITAMIN) capsule Take 0.5 capsules by mouth 2 (two) times daily.       . nitroGLYCERIN (NITROSTAT) 0.4 MG SL tablet Place 1 tablet (0.4 mg total) under the tongue every 5 (five) minutes as needed for chest pain (up to 3 doses).  25 tablet  4  . Omega-3 Fatty Acids (FISH OIL) 1200 MG CAPS Take 1 capsule by mouth every evening.       . OXYCODONE HCL PO Take 1 tablet by mouth every 6 (six) hours as needed (doesn't know dosage).      . predniSONE (DELTASONE) 10 MG tablet Per pt going to pick up pred pack for her back      . Probiotic Product (PROBIOTIC DAILY PO) Take by mouth.      . simvastatin (ZOCOR) 40 MG tablet Take 1 tablet (40 mg total) by mouth at bedtime.  90 tablet  3  . tiotropium (SPIRIVA HANDIHALER) 18 MCG inhalation capsule Place 1 capsule (18 mcg total) into inhaler and inhale daily.  20 capsule  0  . vitamin B-12 (CYANOCOBALAMIN) 1000 MCG tablet Take 1,000 mcg by mouth daily.        . benzonatate (TESSALON) 200 MG capsule Take 1 capsule (200 mg total) by mouth 3 (three) times daily as needed for cough.  270 capsule  0  . Casanthranol-Docusate Sodium 30-100 MG CAPS Take 1 tablet by mouth daily. For constipation       No current facility-administered medications for this encounter.    REVIEW OF SYSTEMS:  A 15 point review of systems is documented in the electronic medical  record. This was obtained by the nursing staff. However, I reviewed this with the patient to discuss relevant findings and make appropriate changes.  Pertinent items are noted in HPI.   PHYSICAL EXAM:  Filed Vitals:   05/04/13 1523  BP: 121/70  Pulse: 94  Temp: 97.8 F (36.6 C)  Resp: 24  .165 lb (74.844 kg). She is a pleasant female in no distress sitting comfortably examining table. She has no palpable supraclavicular cervical or axillary adenopathy. She has some ptosis of her right breast. There is no palpable abnormalities. The implant is palpable. In the left breast at about 8:00 position there is a 2 cm palpable mass in the medial aspect. No palpable abnormalities elsewhere. She does have some asymmetry of her implants. She is alert and oriented x3. She has 5 out of 5 strength bilaterally.  LABORATORY DATA:  Lab Results  Component Value Date   WBC 8.4 04/20/2013   HGB 13.5 04/20/2013   HCT 39.6 04/20/2013   MCV 89.8 04/20/2013   PLT 182 04/20/2013   Lab Results  Component Value Date   NA 143 04/20/2013   K 3.7 04/20/2013   CL 104 10/07/2012   CO2 27 04/20/2013   Lab Results  Component Value Date   ALT 20 04/20/2013   AST 23 04/20/2013   ALKPHOS 111 04/20/2013   BILITOT 0.54 04/20/2013     RADIOGRAPHY: US Breast Left  04/07/2013   *RADIOLOGY REPORT*  Clinical Data:  The patient returns after screening study for evaluation of the left breast. The patient has a family history breast cancer, diagnosed in two sisters at ages 24 of 7.  DIGITAL DIAGNOSTIC LEFT MAMMOGRAM  AND LEFT BREAST ULTRASOUND:  Comparison:  03/23/2013 and earlier  Findings:  ACR Breast Density Category 2: There is a scattered fibroglandular pattern.  The patient has a subglandular implant.  Spot compression views  confirm presence of an irregular mass in the lower inner quadrant of the left breast.  On physical exam, I palpate no abnormality in the lower inner quadrant of the left breast.  Ultrasound is performed, showing an  irregular hypoechoic mass in the 8 o'clock location of the left breast, 9 cm from the nipple. This is immediately adjacent to the anterior wall of the implant and measures 1.6 x 1.2 x 2.0 cm.  Mass is vascular based on Doppler evaluation.  Evaluation of the left axilla is negative for adenopathy.  IMPRESSION:  1.  Irregular, suspicious mass in the 8 o'clock location of the left breast, 9 cm from nipple.  Tissue diagnosis is recommended. 2.  The patient is on Plavix and aspirin because of a history of stroke and heart attack.  We discussed the slightly higher risk of bruising related to the biopsy.  However, because of the patient's high risk for thromboembolic disease, I asked the patient to continue the Plavix and aspirin.  RECOMMENDATION: Ultrasound guided core biopsy is recommended and has been scheduled for the patient at 3:45 p.m. on 04/08/2013.  I have discussed the findings and recommendations with the patient. Results were also provided in writing at the conclusion of the visit.  If applicable, a reminder letter will be sent to the patient regarding the next appointment.  BI-RADS CATEGORY 4:  Suspicious abnormality - biopsy should be considered.   Original Report Authenticated By: Norva Pavlov, M.D.   Mr Breast Bilateral W Wo Contrast  04/19/2013   *RADIOLOGY REPORT*  Clinical Data: Newly-diagnosed left breast eight o'clock location invasive ductal carcinoma.  BUN and creatinine were obtained on site at [facility] at [address] Results:  BUN 18 mg/dL,  Creatinine 0.9 mg/dL.  BILATERAL BREAST MRI WITH AND WITHOUT CONTRAST  Technique: Multiplanar, multisequence MR images of both breasts were obtained prior to and following the intravenous administration of 15ml of Multihance.  Three dimensional images were evaluated at the independent DynaCad workstation.  Comparison:  Prior mammograms and ultrasound  Findings: Background parenchymal enhancement is mild.  The breast parenchymal pattern  demonstrates scattered fibroglandular densities. No abnormal T2-weighted hyperintensity or lymphadenopathy is seen on either side.  There is an oval enhancing mass with central clip artifact in the left breast eight o'clock location measuring 1.7 x 1.7 x 1.7 cm, demonstrating plateau type enhancement.  This corresponds to the biopsy-proven breast cancer. This directly abuts the left subglandular silicone implant.  The patient is not performed for the specific evaluation of implant integrity.  No other area of abnormal enhancement is seen in either breast.  IMPRESSION: Solitary enhancing left breast mass 8 o'clock location, corresponding to biopsy-proven breast cancer.  No MRI evidence for multifocal/multicentric or contralateral malignancy.  RECOMMENDATION: Treatment plan  THREE-DIMENSIONAL MR IMAGE RENDERING ON INDEPENDENT WORKSTATION:  Three-dimensional MR images were rendered by post-processing of the original MR data on an independent workstation.  The three- dimensional MR images were interpreted, and findings were reported in the accompanying complete MRI report for this study.  BI-RADS CATEGORY 6:  Known biopsy-proven malignancy - appropriate action should be taken.   Original Report Authenticated By: Christiana Pellant, M.D.   Mm Digital Diag Ltd L  04/07/2013   *RADIOLOGY REPORT*  Clinical Data:  The patient returns after screening study for evaluation of the left breast. The patient has a family history breast cancer, diagnosed in two sisters at ages 45 of 24.  DIGITAL DIAGNOSTIC LEFT MAMMOGRAM  AND LEFT BREAST  ULTRASOUND:  Comparison:  03/23/2013 and earlier  Findings:  ACR Breast Density Category 2: There is a scattered fibroglandular pattern.  The patient has a subglandular implant.  Spot compression views confirm presence of an irregular mass in the lower inner quadrant of the left breast.  On physical exam, I palpate no abnormality in the lower inner quadrant of the left breast.  Ultrasound is  performed, showing an irregular hypoechoic mass in the 8 o'clock location of the left breast, 9 cm from the nipple. This is immediately adjacent to the anterior wall of the implant and measures 1.6 x 1.2 x 2.0 cm.  Mass is vascular based on Doppler evaluation.  Evaluation of the left axilla is negative for adenopathy.  IMPRESSION:  1.  Irregular, suspicious mass in the 8 o'clock location of the left breast, 9 cm from nipple.  Tissue diagnosis is recommended. 2.  The patient is on Plavix and aspirin because of a history of stroke and heart attack.  We discussed the slightly higher risk of bruising related to the biopsy.  However, because of the patient's high risk for thromboembolic disease, I asked the patient to continue the Plavix and aspirin.  RECOMMENDATION: Ultrasound guided core biopsy is recommended and has been scheduled for the patient at 3:45 p.m. on 04/08/2013.  I have discussed the findings and recommendations with the patient. Results were also provided in writing at the conclusion of the visit.  If applicable, a reminder letter will be sent to the patient regarding the next appointment.  BI-RADS CATEGORY 4:  Suspicious abnormality - biopsy should be considered.   Original Report Authenticated By: Norva Pavlov, M.D.   Mm Digital Diagnostic Unilat L  04/08/2013   *RADIOLOGY REPORT*  Clinical Data:  Ultrasound-guided left breast biopsy eight o'clock location  DIGITAL DIAGNOSTIC LEFT MAMMOGRAM  Comparison:  Previous exams.  Findings:  Films are performed following ultrasound guided biopsy of left breast mass 8 o'clock location, with ribbon shaped clip placement.  The clip is appropriately located.  IMPRESSION: Appropriate ribbon shaped clip location, left breast mass 8 o'clock location.   Original Report Authenticated By: Christiana Pellant, M.D.   Mm Radiologist Eval [facility] Mgmt  04/11/2013   *RADIOLOGY REPORT*  ESTABLISHED PATIENT OFFICE VISIT - LEVEL II [phone])  Chief Complaint:  The patient returns  for results following left breast ultrasound guided core biopsy.  History:  The patient was recalled from screening mammography for a left breast mass.  Left breast ultrasound guided core biopsy performed.  The patient has a family history breast cancer in two sisters (premenopausal).The patient is on Plavix.  Exam:  The biopsy site demonstrates moderate ecchymosis but no hematoma or signs of infection.  Pathology: Pathology demonstrated grade 1 invasive ductal carcinoma.  This is concordant with imaging findings.  Assessment and Plan:  I have discussed the findings with the patient and her daughter (who is a nurse)and answered their questions.  Currently, the patient is scheduled for breast MRI on 04/18/2013 and the breast cancer multidisciplinary clinic on 04/20/2013.  Also, the patient plans to contact an oncologist in [place], [place] ( Dr. Cheree Ditto) for consultation.  I have encouraged the patient to contact the [facility] for additional questions or concerns.   Original Report Authenticated By: Rolla Plate, M.D.   [place] Lt Breast Bx W Loc Dev 1st Lesion Img Bx Spec US Guide  04/08/2013   *RADIOLOGY REPORT*  Clinical Data:  Suspicious left breast mass 8 o'clock location.  ULTRASOUND GUIDED CORE  BIOPSY OF THE left BREAST  Comparison: Previous exams.  I met with the patient and we discussed the procedure of ultrasound- guided biopsy, including benefits and alternatives.  We discussed the high likelihood of a successful procedure. We discussed the risks of the procedure, including infection, bleeding, tissue injury, clip migration, and inadequate sampling.  Informed written consent was given. The usual time-out protocol was performed immediately prior to the procedure.  Using sterile technique and 2% Lidocaine as local anesthetic, under direct ultrasound visualization, a 14 gauge automated biopsy device was used to perform biopsy of left breast mass 8 o'clock location using a lateral to medial approach.   At the conclusion of the procedure a ribbon shaped tissue marker clip was deployed into the biopsy cavity.  Follow up 2 view mammogram was performed and dictated separately.  IMPRESSION: Ultrasound guided biopsy of left breast mass 8 o'clock location, with ribbon shaped clip placement.  No apparent complications.   Original Report Authenticated By: Christiana Pellant, M.D.      IMPRESSION: T1 C. N0 invasive ductal carcinoma the left breast in the setting of subclavicular implants with possible capsule involvement in an elderly patient with recent stent placement and a history of stroke on anti-coagulation also with COPD.  PLAN: I talked to Ms. Jagodzinski today regarding her diagnosis and options for treatment. This tumor clearly is close to the capsule and it would require removal of the implant as well as the capsule at the time of her lumpectomy. This could be complicated given her anticoagulation. Because her implants are so old I suspect plastic surgery will recommend removal. Her timeline of travel may preclude reconstruction. It might be possible for her to have a sub-pectoral tissue expanders placed at the time of her surgery or just the lumpectomy and implant removal with drains placed followed by radiation and delayed reconstruction. She did talk about his options with Dr. Jamey Ripa and Dr. Odis Luster. She could also not have radiation and just go on antiestrogen therapy. Another option would be neoadjuvant antiestrogen therapy to get her through her travels this summer and fall and then returning in November or December to have her definitive surgery.   From a radiation standpoint, her contralateral implant may cause some treatment planning complications which can be overcome and some creative thinking possibly including the use of I MRT and tomotherapy. If she ultimately does not have an expander in the left breast and that left breast is more deflated radiation could quite easily be accomplished. We discussed  the equivalency between antiestrogen therapy and surgery versus surgery plus antiestrogen therapy plus radiation in terms of overall survival. We discussed the low rate of local failure seen in either arm of these patients. She has discussed an Oncotype with Dr. Darnelle Catalan and will wait to make her decision after that test has been received. She says she will decline chemotherapy he then and Oncotype comes back high so she definitely will require antiestrogen therapy.  At this point I think she has 5 options: #1 lumpectomy and removal of the capsule and implant followed by radiation #2 lumpectomy and removal of the capsule and implant followed by radiation and antiestrogen therapy #3 lumpectomy and removal of the capsule and implant followed by antiestrogen therapy #4 mastectomy, no radiation and antiestrogen therapy #5 neoadjuvant antiestrogen therapy to get her through her travels as well as her necessary time on anticoagulation followed by definitive surgery of her choice.  I will discuss her case with Dr. Jamey Ripa and Dr. Odis Luster.  She is scheduled for her stress test on Monday. She is seeing Dr. Odis Luster on Friday.   I spent 40 minutes  face to face with the patient and more than 50% of that time was spent in counseling and/or coordination of care.   ------------------------------------------------  Lurline Hare, MD

## 2013-05-04 NOTE — Progress Notes (Signed)
Please see the Nurse Progress Note in the MD Initial Consult Encounter for this patient. 

## 2013-05-06 ENCOUNTER — Telehealth: Payer: Self-pay | Admitting: Cardiovascular Disease

## 2013-05-06 ENCOUNTER — Telehealth: Payer: Self-pay | Admitting: Pulmonary Disease

## 2013-05-06 NOTE — Telephone Encounter (Signed)
Spoke with pt. She reports plastic surgeon (Dr. Odis Luster) will not do surgery unless pt is off Plavix and ASA for 2 weeks prior to surgery and one week after surgery. Pt reports Dr. Odis Luster suggested she take Lovenox injections in place of Plavix and ASA.  Pt reports surgery to remove tumor will be done at same time as plastic surgery. Surgeon is Dr. Jamey Ripa

## 2013-05-06 NOTE — Telephone Encounter (Signed)
Fax my last note pl

## 2013-05-06 NOTE — Telephone Encounter (Signed)
New problem   Pt wants to speak to nurse before upcoming cancer surgery

## 2013-05-06 NOTE — Telephone Encounter (Signed)
Dr. Vassie Loll please advise what the letter should say? thanks

## 2013-05-06 NOTE — Telephone Encounter (Signed)
Pt gave me fax # [phone]. I have faxed this over. Nothing further was needed

## 2013-05-09 ENCOUNTER — Ambulatory Visit (HOSPITAL_COMMUNITY): Payer: Medicare Other | Attending: Physician Assistant | Admitting: Radiology

## 2013-05-09 VITALS — BP 122/84 | Ht 64.0 in | Wt 162.0 lb

## 2013-05-09 DIAGNOSIS — Z9861 Coronary angioplasty status: Secondary | ICD-10-CM | POA: Insufficient documentation

## 2013-05-09 DIAGNOSIS — Z01810 Encounter for preprocedural cardiovascular examination: Secondary | ICD-10-CM

## 2013-05-09 DIAGNOSIS — R079 Chest pain, unspecified: Secondary | ICD-10-CM

## 2013-05-09 DIAGNOSIS — I252 Old myocardial infarction: Secondary | ICD-10-CM | POA: Insufficient documentation

## 2013-05-09 DIAGNOSIS — R0602 Shortness of breath: Secondary | ICD-10-CM | POA: Insufficient documentation

## 2013-05-09 DIAGNOSIS — I2589 Other forms of chronic ischemic heart disease: Secondary | ICD-10-CM

## 2013-05-09 DIAGNOSIS — Z8673 Personal history of transient ischemic attack (TIA), and cerebral infarction without residual deficits: Secondary | ICD-10-CM | POA: Insufficient documentation

## 2013-05-09 DIAGNOSIS — R0609 Other forms of dyspnea: Secondary | ICD-10-CM | POA: Insufficient documentation

## 2013-05-09 DIAGNOSIS — J449 Chronic obstructive pulmonary disease, unspecified: Secondary | ICD-10-CM | POA: Insufficient documentation

## 2013-05-09 DIAGNOSIS — I2510 Atherosclerotic heart disease of native coronary artery without angina pectoris: Secondary | ICD-10-CM

## 2013-05-09 DIAGNOSIS — R0989 Other specified symptoms and signs involving the circulatory and respiratory systems: Secondary | ICD-10-CM | POA: Insufficient documentation

## 2013-05-09 DIAGNOSIS — J4489 Other specified chronic obstructive pulmonary disease: Secondary | ICD-10-CM | POA: Insufficient documentation

## 2013-05-09 DIAGNOSIS — I10 Essential (primary) hypertension: Secondary | ICD-10-CM | POA: Insufficient documentation

## 2013-05-09 MED ORDER — TECHNETIUM TC 99M SESTAMIBI GENERIC - CARDIOLITE
33.0000 | Freq: Once | INTRAVENOUS | Status: AC | PRN
  Administered 2013-05-09: 33 via INTRAVENOUS

## 2013-05-09 MED ORDER — REGADENOSON 0.4 MG/5ML IV SOLN
0.4000 mg | Freq: Once | INTRAVENOUS | Status: AC
  Administered 2013-05-09: 0.4 mg via INTRAVENOUS

## 2013-05-09 MED ORDER — TECHNETIUM TC 99M SESTAMIBI GENERIC - CARDIOLITE
11.0000 | Freq: Once | INTRAVENOUS | Status: AC | PRN
  Administered 2013-05-09: 11 via INTRAVENOUS

## 2013-05-09 NOTE — Progress Notes (Signed)
[facility] SITE 3 NUCLEAR MED [address] [place], [place] [address] [phone]    Cardiology Nuclear Med Study  [name] is a 72 y.o. female     MRN : [id number]     DOB: [birth year]  Procedure Date: 05/09/2013  Nuclear Med Background Indication for Stress Test:  Evaluation for Ischemia, Surgical Clearance: Preop for Left Breast Surgery with Dr. Jamey Ripa, Stent Patency and PTCA Patency History:  COPD and '90 MI-Angioplasty, '09 MPS: EF: 52% (-) ischemia '10 Stents CFX,  11/13 Heart Cath: Stent patent CFX , mod CAD, stent LAD EF: 35-40% Angioplasty, 12/13 ECHO: EF: 40-45% Cardiac Risk Factors: CVA, Hypertension, Lipids and TIA  Symptoms:  Chest Pain and SOB   Nuclear Pre-Procedure Caffeine/Decaff Intake:  None NPO After: 7:00pm   Lungs:  clear O2 Sat: 95% on room air. IV 0.9% NS with Angio Cath:  22g  IV Site: R Hand  IV Started by:  Cathlyn Parsons, RN  Chest Size (in):  38 Cup Size: D  Height: 5\' 4"  (1.626 m)  Weight:  162 lb (73.483 kg)  BMI:  Body mass index is 27.79 kg/(m^2). Tech Comments:  Toprol taken at 2400    Nuclear Med Study 1 or 2 day study: 1 day  Stress Test Type:  Treadmill/Lexiscan  Reading MD: Olga Millers, MD  Order Authorizing Provider:  Tedra Senegal  Resting Radionuclide: Technetium 30m Sestamibi  Resting Radionuclide Dose: 10.8 mCi   Stress Radionuclide:  Technetium 50m Sestamibi  Stress Radionuclide Dose: 33.0 mCi           Stress Protocol Rest HR: 72 Stress HR: 102  Rest BP: 122/84 Stress BP: 137/62  Exercise Time (min): n/a METS: n/a   Predicted Max HR: 148 bpm % Max HR: 68.92 bpm Rate Pressure Product: 21308   Dose of Adenosine (mg):  n/a Dose of Lexiscan: 0.4 mg  Dose of Atropine (mg): n/a Dose of Dobutamine: n/a mcg/kg/min (at max HR)  Stress Test Technologist: Milana Na, EMT-P  Nuclear Technologist:  Harlow Asa, CNMT     Rest Procedure:  Myocardial perfusion imaging was performed at  rest 45 minutes following the intravenous administration of Technetium 8m Sestamibi. Rest ECG: NSR with PVC, septal MI, inferolateral TWI.  Stress Procedure:  The patient received IV Lexiscan 0.4 mg over 15-seconds with concurrent low level exercise and then Technetium 98m Sestamibi was injected at 30-seconds while the patient continued walking one more minute. This patient got sob and chest tightness with the Lexiscan injection. Quantitative spect images were obtained after a 45-minute delay. Stress ECG: No significant ST segment change suggestive of ischemia.  QPS Raw Data Images:  Acquisition technically good; normal left ventricular size. Stress Images:  There is decreased uptake in the anterior wall, septum and apex. Rest Images:  There is decreased uptake in the anterior wall, septum and apex. Subtraction (SDS):  There is a fixed defect that is most consistent with a previous infarction. Transient Ischemic Dilatation (Normal <1.22):  n/a Lung/Heart Ratio (Normal <0.45):  0.26  Quantitative Gated Spect Images QGS EDV:  94 ml QGS ESV:  39 ml  Impression Exercise Capacity:  Lexiscan with no exercise. BP Response:  Normal blood pressure response. Clinical Symptoms:  There is chest tightness and dyspnea. ECG Impression:  No significant ST segment change suggestive of ischemia. Comparison with Prior Nuclear Study: No images to compare  Overall Impression:  Intermediate risk stress nuclear study with large, severe, fixed defect in the anterior wall,  septum and apex consistent with prior infarct; no ischemia..  LV Ejection Fraction: 59%.  LV Wall Motion:  Akinesis of the anterior wall and apex; calculated EF appears to overestimate LV function; suggest echocardiogram to better assess.   Olga Millers

## 2013-05-10 ENCOUNTER — Telehealth (INDEPENDENT_AMBULATORY_CARE_PROVIDER_SITE_OTHER): Payer: Self-pay | Admitting: Surgery

## 2013-05-10 NOTE — Telephone Encounter (Signed)
Pt is trying to get clearance for surgery to remove her left breast implant prior to treatment of breast CA.  She has seen Tereso Newcomer, PA-C who ordered a lexiscan which has been completed (results in EPIC).  Pt is aware Dr Clifton James not in the office this week to review.  Pt needs to be off Plavix and ASA 2 weeks prior to and 1 week after surgery according to Dr Luna Kitchens.   They want her to have Lovenox injections to bridge covering these 3 weeks.   Pt is aware Tereso Newcomer is not in the office either.  Aware I will attempt to have an MD review so that she can move forward with her CA treatment and surgery.

## 2013-05-10 NOTE — Telephone Encounter (Signed)
Follow up  Pt is calling regarding getting surgical clearance. She said the surgeon is refusing to do it until he hears from the provider.

## 2013-05-10 NOTE — Telephone Encounter (Signed)
reviewed with Dr Shirlee Latch who would prefer Dr Clifton James determine plan of care - pt is aware and aware someone will get back with her ASAP.

## 2013-05-10 NOTE — Telephone Encounter (Signed)
Dr Odis Luster is waiting for cardiac clearance for this patient.  He will only do at [facility] due to her cardiac issues.  Will not be 1st week of august.

## 2013-05-11 NOTE — Telephone Encounter (Signed)
Cardia clearance in Epic.

## 2013-05-12 ENCOUNTER — Encounter: Payer: Self-pay | Admitting: Cardiovascular Disease

## 2013-05-12 NOTE — Telephone Encounter (Signed)
Can we let Ms. Steward know that her stress test does not show ischemia. It does show scar which is known from her prior CAD. She can proceed with surgery. She had a stent placed 9 months ago. The newer drug coated stents require at least 6 months of dual antiplatelet therapy with ASA and Plavix and preferably 12 months. She is past the 6 month point now. Since she needs breast cancer surgery, her ASA and Plavix can be held 5 days before procedure since she needs the surgery. No indication for Lovenox injections to be used in the place of ASA and Plavix like was suggested in this phone noted. I will write a letter to her Surgeon. It will be in the letters section of her chart in EPIC. Thanks, chris

## 2013-05-12 NOTE — Telephone Encounter (Signed)
Dr Jamey Ripa was sent a clearance letter from Dr Clifton James.

## 2013-05-12 NOTE — Telephone Encounter (Signed)
Dr Odis Luster was also sent letter of clearance. Pt was updated and verbalized understanding.

## 2013-05-12 NOTE — Telephone Encounter (Signed)
Can we also fax my letter to Dr. Tenna Child office. I sent it through [facility] as well. Thanks, chris

## 2013-05-12 NOTE — Telephone Encounter (Signed)
msg left  to call back and ask to speak with a triage nurse/ number provided.

## 2013-05-13 ENCOUNTER — Ambulatory Visit: Payer: Medicare Other | Admitting: Physician Assistant

## 2013-05-13 ENCOUNTER — Telehealth (INDEPENDENT_AMBULATORY_CARE_PROVIDER_SITE_OTHER): Payer: Self-pay | Admitting: Surgery

## 2013-05-13 NOTE — Telephone Encounter (Signed)
Per Dr Odis Luster pt has cardiac issue he is concerned about. He is bringing her into his office Monday 7/28 to discuss.  His office will contact [facility] with details

## 2013-05-18 ENCOUNTER — Telehealth: Payer: Self-pay | Admitting: *Deleted

## 2013-05-18 ENCOUNTER — Other Ambulatory Visit (HOSPITAL_COMMUNITY): Payer: Self-pay | Admitting: Oncology

## 2013-05-18 NOTE — Telephone Encounter (Signed)
Rec'd voicemail from patient that she is concerned since she still does not have a surgery date and is still trying to get Dr Jamey Ripa and Dr Odis Luster to coordinate surgery date, which may be past August 18!  She states that Dr Michell Heinrich wants her to start a pill since no surgery date is planned at this time. If Rx can be escribed in, patient uses [facility] pharmacy with 90days supply. Prefers generic due to she has hit her "donut hole". Will notify Breast Clinic Navigators and Dr Darnelle Catalan of patients call and concerns.

## 2013-05-18 NOTE — Telephone Encounter (Signed)
Dr. Clifton James has left message for pt with information regarding surgical clearance and holding of ASA and Plavix. Message left that information has been sent to her surgeons.

## 2013-05-19 ENCOUNTER — Other Ambulatory Visit: Payer: Self-pay | Admitting: *Deleted

## 2013-05-19 ENCOUNTER — Telehealth: Payer: Self-pay | Admitting: *Deleted

## 2013-05-19 DIAGNOSIS — C50919 Malignant neoplasm of unspecified site of unspecified female breast: Secondary | ICD-10-CM

## 2013-05-19 MED ORDER — ANASTROZOLE 1 MG PO TABS: 1.0000 mg | ORAL_TABLET | Freq: Every day | ORAL | Status: DC

## 2013-05-19 NOTE — Telephone Encounter (Signed)
Left vm for pt to return call to discuss AI.

## 2013-05-19 NOTE — Telephone Encounter (Signed)
Spoke to pt concerning AI therapy and f/u appt with Dr. Darnelle Catalan.  Cancel appt with Dr. Darnelle Catalan on 8/1.  Informed pt that we will r/s appt after surgery.  Discussed AI therapy.  Pt in question of when surgery date will start.  I left vm for Debbie at [facility] to return call regarding surgery schedule.  Pt denies further needs at this time.

## 2013-05-20 ENCOUNTER — Ambulatory Visit: Payer: Medicare Other | Admitting: Oncology

## 2013-05-23 ENCOUNTER — Other Ambulatory Visit: Payer: Self-pay | Admitting: Cardiovascular Disease

## 2013-05-26 ENCOUNTER — Encounter (HOSPITAL_COMMUNITY): Payer: Self-pay | Admitting: Pharmacy Technician

## 2013-05-31 ENCOUNTER — Encounter (HOSPITAL_COMMUNITY)
Admission: RE | Admit: 2013-05-31 | Discharge: 2013-05-31 | Disposition: A | Discharge status: Home / Self Care | Payer: Medicare Other | Source: Ambulatory Visit | Attending: Surgery | Admitting: Surgery

## 2013-05-31 ENCOUNTER — Encounter (HOSPITAL_COMMUNITY): Payer: Self-pay

## 2013-05-31 ENCOUNTER — Encounter (HOSPITAL_COMMUNITY)
Admission: RE | Admit: 2013-05-31 | Discharge: 2013-05-31 | Disposition: A | Discharge status: Home / Self Care | Payer: Medicare Other | Source: Ambulatory Visit | Attending: Plastic Surgery | Admitting: Plastic Surgery

## 2013-05-31 ENCOUNTER — Other Ambulatory Visit: Payer: Self-pay | Admitting: Plastic Surgery

## 2013-05-31 LAB — CBC
Hemoglobin: 14.7 g/dL (ref 12.0–15.0)
MCH: 30.9 pg (ref 26.0–34.0)
Platelets: 163 10*3/uL (ref 150–400)
RBC: 4.76 MIL/uL (ref 3.87–5.11)
WBC: 9.7 10*3/uL (ref 4.0–10.5)

## 2013-05-31 LAB — PROTIME-INR: INR: 1.01 (ref 0.00–1.49)

## 2013-05-31 LAB — COMPREHENSIVE METABOLIC PANEL
ALT: 21 U/L (ref 0–35)
AST: 22 U/L (ref 0–37)
Alkaline Phosphatase: 114 U/L (ref 39–117)
CO2: 28 mEq/L (ref 19–32)
Calcium: 9.6 mg/dL (ref 8.4–10.5)
Chloride: 104 mEq/L (ref 96–112)
GFR calc Af Amer: 74 mL/min — ABNORMAL LOW (ref >90)
GFR calc non Af Amer: 64 mL/min — ABNORMAL LOW (ref >90)
Glucose, Bld: 85 mg/dL (ref 70–99)
Potassium: 4.7 mEq/L (ref 3.5–5.1)
Sodium: 141 mEq/L (ref 135–145)

## 2013-05-31 MED ORDER — CHLORHEXIDINE GLUCONATE 4 % EX LIQD: 1.0000 "application " | Freq: Once | CUTANEOUS | Status: DC

## 2013-05-31 NOTE — Pre-Procedure Instructions (Signed)
[name]  05/31/2013   Your procedure is scheduled on:  Thursday, August 14th   Report to [facility] at 8:00 AM.  Call this number if you have problems the morning of surgery: [phone]   Remember:   Do not eat food or drink liquids after midnight Wednesday.   Take these medicines the morning of surgery with A SIP OF WATER: Gabapentin, Protonix, Ditropan, Percocet (if needed), Metoprolol, and Inhalers (definitely the Advair & Spiriva)   Do not wear jewelry, make-up or nail polish.  Do not wear lotions, powders, or perfumes. You may NOT wear deodorant.  Do not shave underarms & legs 48 hours prior to surgery.              Discontinue Aspirin, Plavix, Effient, Coumadin, and all herbal medications 4-5 days prior to surgery.                Do not bring valuables to the hospital.  [facility] is not responsible for any belongings or valuables.  Contacts, dentures or bridgework may not be worn into surgery.   Leave suitcase in the car. After surgery it may be brought to your room.  For patients admitted to the hospital, checkout time is 11:00 AM the day of discharge.   Name and phone number of your driver:    Special Instructions: Shower using CHG 2 nights before surgery and the night before surgery.  If you shower the day of surgery use CHG.  Use special wash - you have one bottle of CHG for all showers.  You should use approximately 1/3 of the bottle for each shower.   Please read over the following fact sheets that you were given: Pain Booklet, Coughing and Deep Breathing and Surgical Site Infection Prevention

## 2013-05-31 NOTE — Progress Notes (Addendum)
NOTE to either Surgeon-- Mrs Dant is adamant that she needs the following...Marland Kitchenoxygen 2/liters [place] at night, she has to stay on track with her Albuterol Tx every 6 hrs to avoid getting pneumonia, and will also need her Advair 50/250, Spiriva to keep her healthy and lungs strong.  Thanks   DA  Pt has signed up for Pathology Research study.Christy @ [phone] for any questions. DA

## 2013-06-01 ENCOUNTER — Other Ambulatory Visit: Payer: Self-pay | Admitting: Ophthalmology

## 2013-06-01 ENCOUNTER — Telehealth (INDEPENDENT_AMBULATORY_CARE_PROVIDER_SITE_OTHER): Payer: Self-pay | Admitting: *Deleted

## 2013-06-01 ENCOUNTER — Telehealth: Payer: Self-pay | Admitting: Pulmonary Disease

## 2013-06-01 ENCOUNTER — Telehealth: Payer: Self-pay | Admitting: Neurology

## 2013-06-01 LAB — SURGICAL PCR SCREEN
MRSA, PCR: POSITIVE — AB
Staphylococcus aureus: POSITIVE — AB

## 2013-06-01 MED ORDER — VANCOMYCIN HCL IN DEXTROSE 1-5 GM/200ML-% IV SOLN
1000.0000 mg | INTRAVENOUS | Status: AC
  Administered 2013-06-02: 1000 mg via INTRAVENOUS
  Filled 2013-06-01: qty 200

## 2013-06-01 MED ORDER — ENOXAPARIN SODIUM 40 MG/0.4ML ~~LOC~~ SOLN
40.0000 mg | Freq: Once | SUBCUTANEOUS | Status: AC
  Administered 2013-06-02: 40 mg via SUBCUTANEOUS
  Filled 2013-06-01 (×2): qty 0.4

## 2013-06-01 MED ORDER — ALBUTEROL SULFATE HFA 108 (90 BASE) MCG/ACT IN AERS: 2.0000 | INHALATION_SPRAY | Freq: Four times a day (QID) | RESPIRATORY_TRACT | Status: DC | PRN

## 2013-06-01 MED ORDER — TIOTROPIUM BROMIDE MONOHYDRATE 18 MCG IN CAPS: 18.0000 ug | ORAL_CAPSULE | Freq: Every day | RESPIRATORY_TRACT | Status: DC

## 2013-06-01 MED ORDER — FLUTICASONE-SALMETEROL 250-50 MCG/DOSE IN AEPB: 1.0000 | INHALATION_SPRAY | Freq: Two times a day (BID) | RESPIRATORY_TRACT | Status: DC

## 2013-06-01 NOTE — Telephone Encounter (Signed)
Spoke with patient-she is due for breast surgery (remove cancer lump and lymph nodes if needed) in the morning at 8am at [facility]. Pt states she needs RA to write an order for her to have albuterol 0.083% every 6 hours (to help prevent PNA or lung infections while in hospital), O2 at 2L/M QHS, Advair 250/50, and Spiriva. Pt went for pre admission at hospital and listed medications but states albuterol was not on list and she is worried that if RA doesn't write the orders for her then she will go without her medications. Dr Cyndia Bent is cancer surgeon and Dr Etter Sjogren is the plastic surgeon.   I spoke with RA about this matter; he stated to have patient tell Dr Jamey Ripa in the AM prior to surgery to have him call RA so he can place the orders as requested. RA will be at [facility] tomorrow. Pt is aware of this and understands if any trouble to call the office and speak with me.   Pt then needed samples of Advair, Spiriva, and Proair while here in the office; samples given and documented. Rx for 90 day supply of Proair was sent to [facility] as requested by patient.   Will sign and route message to RA as a reminder.

## 2013-06-01 NOTE — Telephone Encounter (Signed)
This was discussed with Dr Odis Luster who likely had arranged for Mupiricin

## 2013-06-01 NOTE — Telephone Encounter (Signed)
[name] with [facility] called to let us know patient is positive for staff and MRSA.  Explained that a message will be sent to Dr. Jamey Ripa to ask about prescription.  [name] states patient already has a medication for this and will start taking it.  This was not authorized by this RN however it was the decision of the patient to do this.

## 2013-06-02 ENCOUNTER — Ambulatory Visit (HOSPITAL_COMMUNITY): Payer: Medicare Other | Admitting: Anesthesiology

## 2013-06-02 ENCOUNTER — Encounter (HOSPITAL_COMMUNITY): Payer: Self-pay | Admitting: Anesthesiology

## 2013-06-02 ENCOUNTER — Inpatient Hospital Stay (HOSPITAL_COMMUNITY)
Admission: RE | Admit: 2013-06-02 | Discharge: 2013-06-04 | DRG: 581 | Disposition: A | Discharge status: Home / Self Care | Payer: Medicare Other | Source: Ambulatory Visit | Attending: Surgery | Admitting: Surgery

## 2013-06-02 ENCOUNTER — Encounter (HOSPITAL_COMMUNITY)
Admission: RE | Disposition: A | Discharge status: Home / Self Care | Payer: Self-pay | Source: Ambulatory Visit | Attending: Surgery

## 2013-06-02 ENCOUNTER — Encounter (HOSPITAL_COMMUNITY): Payer: Self-pay

## 2013-06-02 ENCOUNTER — Ambulatory Visit
Admission: RE | Admit: 2013-06-02 | Discharge: 2013-06-02 | Disposition: A | Discharge status: Home / Self Care | Payer: Medicare Other | Source: Ambulatory Visit | Attending: Surgery | Admitting: Surgery

## 2013-06-02 ENCOUNTER — Encounter (HOSPITAL_COMMUNITY): Payer: Self-pay | Admitting: Critical Care Medicine

## 2013-06-02 ENCOUNTER — Other Ambulatory Visit (HOSPITAL_COMMUNITY): Payer: Self-pay | Admitting: Plastic Surgery

## 2013-06-02 ENCOUNTER — Encounter (HOSPITAL_COMMUNITY)
Admission: RE | Admit: 2013-06-02 | Discharge: 2013-06-02 | Disposition: A | Discharge status: Home / Self Care | Payer: Medicare Other | Source: Ambulatory Visit | Attending: Surgery | Admitting: Surgery

## 2013-06-02 DIAGNOSIS — I2589 Other forms of chronic ischemic heart disease: Secondary | ICD-10-CM | POA: Diagnosis present

## 2013-06-02 DIAGNOSIS — K219 Gastro-esophageal reflux disease without esophagitis: Secondary | ICD-10-CM | POA: Diagnosis present

## 2013-06-02 DIAGNOSIS — H8110 Benign paroxysmal vertigo, unspecified ear: Secondary | ICD-10-CM | POA: Diagnosis present

## 2013-06-02 DIAGNOSIS — I10 Essential (primary) hypertension: Secondary | ICD-10-CM | POA: Diagnosis present

## 2013-06-02 DIAGNOSIS — C50319 Malignant neoplasm of lower-inner quadrant of unspecified female breast: Principal | ICD-10-CM | POA: Diagnosis present

## 2013-06-02 DIAGNOSIS — M899 Disorder of bone, unspecified: Secondary | ICD-10-CM | POA: Diagnosis present

## 2013-06-02 DIAGNOSIS — C50912 Malignant neoplasm of unspecified site of left female breast: Secondary | ICD-10-CM

## 2013-06-02 DIAGNOSIS — C50312 Malignant neoplasm of lower-inner quadrant of left female breast: Secondary | ICD-10-CM

## 2013-06-02 DIAGNOSIS — I2510 Atherosclerotic heart disease of native coronary artery without angina pectoris: Secondary | ICD-10-CM | POA: Diagnosis present

## 2013-06-02 DIAGNOSIS — I252 Old myocardial infarction: Secondary | ICD-10-CM

## 2013-06-02 DIAGNOSIS — Z96619 Presence of unspecified artificial shoulder joint: Secondary | ICD-10-CM

## 2013-06-02 DIAGNOSIS — Z860100 Personal history of colon polyps, unspecified: Secondary | ICD-10-CM

## 2013-06-02 DIAGNOSIS — Z978 Presence of other specified devices: Secondary | ICD-10-CM

## 2013-06-02 DIAGNOSIS — J449 Chronic obstructive pulmonary disease, unspecified: Secondary | ICD-10-CM

## 2013-06-02 DIAGNOSIS — J438 Other emphysema: Secondary | ICD-10-CM | POA: Diagnosis present

## 2013-06-02 DIAGNOSIS — C50919 Malignant neoplasm of unspecified site of unspecified female breast: Secondary | ICD-10-CM | POA: Insufficient documentation

## 2013-06-02 DIAGNOSIS — R0902 Hypoxemia: Secondary | ICD-10-CM

## 2013-06-02 DIAGNOSIS — E785 Hyperlipidemia, unspecified: Secondary | ICD-10-CM | POA: Diagnosis present

## 2013-06-02 DIAGNOSIS — J209 Acute bronchitis, unspecified: Secondary | ICD-10-CM

## 2013-06-02 DIAGNOSIS — J4489 Other specified chronic obstructive pulmonary disease: Secondary | ICD-10-CM | POA: Diagnosis present

## 2013-06-02 DIAGNOSIS — J441 Chronic obstructive pulmonary disease with (acute) exacerbation: Secondary | ICD-10-CM

## 2013-06-02 DIAGNOSIS — Z9981 Dependence on supplemental oxygen: Secondary | ICD-10-CM

## 2013-06-02 DIAGNOSIS — Z8673 Personal history of transient ischemic attack (TIA), and cerebral infarction without residual deficits: Secondary | ICD-10-CM

## 2013-06-02 DIAGNOSIS — G478 Other sleep disorders: Secondary | ICD-10-CM | POA: Diagnosis present

## 2013-06-02 HISTORY — PX: BREAST LUMPECTOMY WITH NEEDLE LOCALIZATION AND AXILLARY SENTINEL LYMPH NODE BX: SHX5760

## 2013-06-02 HISTORY — PX: BREAST IMPLANT REMOVAL: SHX5361

## 2013-06-02 LAB — CBC
Hemoglobin: 12.7 g/dL (ref 12.0–15.0)
MCH: 30 pg (ref 26.0–34.0)
MCHC: 33.2 g/dL (ref 30.0–36.0)
Platelets: 135 10*3/uL — ABNORMAL LOW (ref 150–400)
RDW: 13 % (ref 11.5–15.5)

## 2013-06-02 SURGERY — BREAST LUMPECTOMY WITH NEEDLE LOCALIZATION AND AXILLARY SENTINEL LYMPH NODE BX
Anesthesia: General | Site: Breast | Laterality: Left | Wound class: Clean

## 2013-06-02 MED ORDER — HYDROMORPHONE HCL PF 1 MG/ML IJ SOLN
0.2500 mg | INTRAMUSCULAR | Status: DC | PRN
  Administered 2013-06-02: 1 mg via INTRAVENOUS
  Administered 2013-06-02 (×2): 0.5 mg via INTRAVENOUS

## 2013-06-02 MED ORDER — MOMETASONE FURO-FORMOTEROL FUM 100-5 MCG/ACT IN AERO
2.0000 | INHALATION_SPRAY | Freq: Two times a day (BID) | RESPIRATORY_TRACT | Status: DC
  Administered 2013-06-02 – 2013-06-04 (×5): 2 via RESPIRATORY_TRACT
  Filled 2013-06-02: qty 8.8

## 2013-06-02 MED ORDER — TECHNETIUM TC 99M SULFUR COLLOID FILTERED
1.0000 | Freq: Once | INTRAVENOUS | Status: AC | PRN
  Administered 2013-06-02: 1 via INTRADERMAL

## 2013-06-02 MED ORDER — HYDROMORPHONE HCL 2 MG PO TABS
2.0000 mg | ORAL_TABLET | ORAL | Status: AC
  Filled 2013-06-02: qty 2
  Filled 2013-06-02: qty 1

## 2013-06-02 MED ORDER — ALBUTEROL SULFATE HFA 108 (90 BASE) MCG/ACT IN AERS
INHALATION_SPRAY | RESPIRATORY_TRACT | Status: DC | PRN
  Administered 2013-06-02: 2 via RESPIRATORY_TRACT

## 2013-06-02 MED ORDER — PANTOPRAZOLE SODIUM 40 MG PO TBEC
40.0000 mg | DELAYED_RELEASE_TABLET | Freq: Every day | ORAL | Status: DC
  Administered 2013-06-03 – 2013-06-04 (×2): 40 mg via ORAL
  Filled 2013-06-02 (×2): qty 1

## 2013-06-02 MED ORDER — HYDROMORPHONE HCL PF 1 MG/ML IJ SOLN
INTRAMUSCULAR | Status: AC
  Filled 2013-06-02: qty 1

## 2013-06-02 MED ORDER — BENZONATATE 100 MG PO CAPS
200.0000 mg | ORAL_CAPSULE | Freq: Three times a day (TID) | ORAL | Status: DC | PRN
  Administered 2013-06-04: 200 mg via ORAL
  Filled 2013-06-02: qty 2

## 2013-06-02 MED ORDER — LACTATED RINGERS IV SOLN
INTRAVENOUS | Status: DC
  Administered 2013-06-02: 20 mL/h via INTRAVENOUS
  Administered 2013-06-02: 12:00:00 via INTRAVENOUS

## 2013-06-02 MED ORDER — DEXTROSE-NACL 5-0.45 % IV SOLN
INTRAVENOUS | Status: DC
  Administered 2013-06-02: 17:00:00 via INTRAVENOUS

## 2013-06-02 MED ORDER — FENTANYL CITRATE 0.05 MG/ML IJ SOLN
INTRAMUSCULAR | Status: DC | PRN
  Administered 2013-06-02 (×2): 50 ug via INTRAVENOUS
  Administered 2013-06-02: 25 ug via INTRAVENOUS
  Administered 2013-06-02: 50 ug via INTRAVENOUS
  Administered 2013-06-02: 75 ug via INTRAVENOUS
  Administered 2013-06-02: 50 ug via INTRAVENOUS

## 2013-06-02 MED ORDER — GLYCOPYRROLATE 0.2 MG/ML IJ SOLN
INTRAMUSCULAR | Status: DC | PRN
  Administered 2013-06-02: 0.6 mg via INTRAVENOUS

## 2013-06-02 MED ORDER — ANASTROZOLE 1 MG PO TABS
1.0000 mg | ORAL_TABLET | Freq: Every day | ORAL | Status: DC
  Administered 2013-06-02 – 2013-06-04 (×3): 1 mg via ORAL
  Filled 2013-06-02 (×3): qty 1

## 2013-06-02 MED ORDER — LORATADINE 10 MG PO TABS
10.0000 mg | ORAL_TABLET | Freq: Every day | ORAL | Status: DC
  Administered 2013-06-03 – 2013-06-04 (×2): 10 mg via ORAL
  Filled 2013-06-02 (×2): qty 1

## 2013-06-02 MED ORDER — ONDANSETRON HCL 4 MG/2ML IJ SOLN
INTRAMUSCULAR | Status: DC | PRN
  Administered 2013-06-02: 4 mg via INTRAVENOUS

## 2013-06-02 MED ORDER — ALENDRONATE SODIUM 70 MG PO TABS: 70.0000 mg | ORAL_TABLET | ORAL | Status: DC

## 2013-06-02 MED ORDER — LIDOCAINE HCL (CARDIAC) 20 MG/ML IV SOLN
INTRAVENOUS | Status: DC | PRN
  Administered 2013-06-02: 100 mg via INTRAVENOUS

## 2013-06-02 MED ORDER — VANCOMYCIN HCL 10 G IV SOLR
1250.0000 mg | INTRAVENOUS | Status: DC
  Administered 2013-06-03: 1250 mg via INTRAVENOUS
  Filled 2013-06-02 (×2): qty 1250

## 2013-06-02 MED ORDER — SIMVASTATIN 40 MG PO TABS
40.0000 mg | ORAL_TABLET | Freq: Every evening | ORAL | Status: DC
  Administered 2013-06-02 – 2013-06-04 (×3): 40 mg via ORAL
  Filled 2013-06-02 (×4): qty 1

## 2013-06-02 MED ORDER — ENOXAPARIN SODIUM 40 MG/0.4ML ~~LOC~~ SOLN
40.0000 mg | SUBCUTANEOUS | Status: DC
  Administered 2013-06-03 – 2013-06-04 (×2): 40 mg via SUBCUTANEOUS
  Filled 2013-06-02 (×3): qty 0.4

## 2013-06-02 MED ORDER — METHYLENE BLUE 1 % INJ SOLN
INTRAMUSCULAR | Status: AC
  Filled 2013-06-02: qty 10

## 2013-06-02 MED ORDER — MIDAZOLAM HCL 5 MG/5ML IJ SOLN
INTRAMUSCULAR | Status: DC | PRN
  Administered 2013-06-02: 2 mg via INTRAVENOUS

## 2013-06-02 MED ORDER — HYDROMORPHONE HCL 2 MG PO TABS
2.0000 mg | ORAL_TABLET | ORAL | Status: DC | PRN
  Administered 2013-06-02: 4 mg via ORAL
  Administered 2013-06-02: 2 mg via ORAL
  Administered 2013-06-02: 4 mg via ORAL
  Administered 2013-06-03: 2 mg via ORAL
  Administered 2013-06-03 – 2013-06-04 (×3): 4 mg via ORAL
  Administered 2013-06-04: 2 mg via ORAL
  Administered 2013-06-04: 4 mg via ORAL
  Administered 2013-06-04: 2 mg via ORAL
  Filled 2013-06-02: qty 1
  Filled 2013-06-02 (×2): qty 2
  Filled 2013-06-02: qty 1
  Filled 2013-06-02 (×4): qty 2
  Filled 2013-06-02: qty 1

## 2013-06-02 MED ORDER — SODIUM CHLORIDE 0.9 % IR SOLN
Status: DC | PRN
  Administered 2013-06-02 (×2)

## 2013-06-02 MED ORDER — ROCURONIUM BROMIDE 100 MG/10ML IV SOLN
INTRAVENOUS | Status: DC | PRN
  Administered 2013-06-02 (×3): 5 mg via INTRAVENOUS
  Administered 2013-06-02: 50 mg via INTRAVENOUS
  Administered 2013-06-02 (×2): 5 mg via INTRAVENOUS

## 2013-06-02 MED ORDER — PROMETHAZINE HCL 25 MG/ML IJ SOLN: 6.2500 mg | INTRAMUSCULAR | Status: DC | PRN

## 2013-06-02 MED ORDER — ALBUTEROL SULFATE HFA 108 (90 BASE) MCG/ACT IN AERS
2.0000 | INHALATION_SPRAY | Freq: Four times a day (QID) | RESPIRATORY_TRACT | Status: DC | PRN
  Administered 2013-06-02 – 2013-06-04 (×2): 2 via RESPIRATORY_TRACT
  Filled 2013-06-02: qty 6.7

## 2013-06-02 MED ORDER — SODIUM CHLORIDE 0.9 % IJ SOLN
INTRAMUSCULAR | Status: DC | PRN
  Administered 2013-06-02: 11:00:00

## 2013-06-02 MED ORDER — OXYBUTYNIN CHLORIDE 5 MG PO TABS
2.5000 mg | ORAL_TABLET | Freq: Every day | ORAL | Status: DC
  Administered 2013-06-02: 2.5 mg via ORAL
  Administered 2013-06-03: 22:00:00 via ORAL
  Administered 2013-06-04: 2.5 mg via ORAL
  Filled 2013-06-02: qty 1
  Filled 2013-06-02 (×2): qty 0.5

## 2013-06-02 MED ORDER — TIOTROPIUM BROMIDE MONOHYDRATE 18 MCG IN CAPS
18.0000 ug | ORAL_CAPSULE | Freq: Every day | RESPIRATORY_TRACT | Status: DC
  Administered 2013-06-03 – 2013-06-04 (×3): 18 ug via RESPIRATORY_TRACT
  Filled 2013-06-02: qty 5

## 2013-06-02 MED ORDER — NEOSTIGMINE METHYLSULFATE 1 MG/ML IJ SOLN
INTRAMUSCULAR | Status: DC | PRN
  Administered 2013-06-02: 4 mg via INTRAVENOUS

## 2013-06-02 MED ORDER — METOPROLOL SUCCINATE 12.5 MG HALF TABLET
12.5000 mg | ORAL_TABLET | Freq: Every day | ORAL | Status: DC
  Administered 2013-06-02 – 2013-06-04 (×3): 12.5 mg via ORAL
  Filled 2013-06-02 (×3): qty 1

## 2013-06-02 MED ORDER — DEXAMETHASONE SODIUM PHOSPHATE 4 MG/ML IJ SOLN
INTRAMUSCULAR | Status: DC | PRN
  Administered 2013-06-02: 4 mg via INTRAVENOUS

## 2013-06-02 MED ORDER — PROPOFOL 10 MG/ML IV BOLUS
INTRAVENOUS | Status: DC | PRN
  Administered 2013-06-02: 160 mg via INTRAVENOUS

## 2013-06-02 MED ORDER — NITROGLYCERIN 0.4 MG SL SUBL: 0.4000 mg | SUBLINGUAL_TABLET | SUBLINGUAL | Status: DC | PRN

## 2013-06-02 MED ORDER — HYDROMORPHONE HCL 2 MG PO TABS: 2.0000 mg | ORAL_TABLET | Freq: Once | ORAL | Status: DC

## 2013-06-02 MED ORDER — CLONAZEPAM 0.5 MG PO TBDP: 0.5000 mg | ORAL_TABLET | ORAL | Status: DC

## 2013-06-02 MED ORDER — GABAPENTIN 300 MG PO CAPS
300.0000 mg | ORAL_CAPSULE | Freq: Every day | ORAL | Status: DC
  Administered 2013-06-02 – 2013-06-04 (×3): 300 mg via ORAL
  Filled 2013-06-02 (×3): qty 1

## 2013-06-02 MED ORDER — 0.9 % SODIUM CHLORIDE (POUR BTL) OPTIME
TOPICAL | Status: DC | PRN
  Administered 2013-06-02 (×2): 1000 mL

## 2013-06-02 MED ORDER — CLONAZEPAM 0.5 MG PO TABS
0.5000 mg | ORAL_TABLET | Freq: Every day | ORAL | Status: DC
  Administered 2013-06-02 – 2013-06-04 (×3): 0.5 mg via ORAL
  Filled 2013-06-02 (×3): qty 1

## 2013-06-02 SURGICAL SUPPLY — 85 items
APPLIER CLIP 9.375 MED OPEN (MISCELLANEOUS)
APPLIER CLIP 9.375 SM OPEN (CLIP) ×2
ATCH SMKEVC FLXB CAUT HNDSWH (FILTER) ×1 IMPLANT
BAG DECANTER FOR FLEXI CONT (MISCELLANEOUS) ×2 IMPLANT
BANDAGE ELASTIC 6 VELCRO ST LF (GAUZE/BANDAGES/DRESSINGS) IMPLANT
BINDER BREAST XLRG (GAUZE/BANDAGES/DRESSINGS) ×2 IMPLANT
BIOPATCH RED 1 DISK 7.0 (GAUZE/BANDAGES/DRESSINGS) ×4 IMPLANT
BLADE SURG 10 STRL SS (BLADE) IMPLANT
BLADE SURG 15 STRL LF DISP TIS (BLADE) IMPLANT
BLADE SURG 15 STRL SS (BLADE)
CANISTER SUCTION 2500CC (MISCELLANEOUS) ×2 IMPLANT
CHLORAPREP W/TINT 26ML (MISCELLANEOUS) ×4 IMPLANT
CLIP APPLIE 9.375 MED OPEN (MISCELLANEOUS) IMPLANT
CLIP APPLIE 9.375 SM OPEN (CLIP) ×1 IMPLANT
CLOTH BEACON ORANGE TIMEOUT ST (SAFETY) ×2 IMPLANT
CONT SPEC 4OZ CLIKSEAL STRL BL (MISCELLANEOUS) ×4 IMPLANT
COVER PROBE W GEL 5X96 (DRAPES) ×2 IMPLANT
COVER SURGICAL LIGHT HANDLE (MISCELLANEOUS) ×4 IMPLANT
DECANTER SPIKE VIAL GLASS SM (MISCELLANEOUS) IMPLANT
DERMABOND ADVANCED (GAUZE/BANDAGES/DRESSINGS) ×1
DERMABOND ADVANCED .7 DNX12 (GAUZE/BANDAGES/DRESSINGS) ×1 IMPLANT
DEVICE DUBIN SPECIMEN MAMMOGRA (MISCELLANEOUS) ×2 IMPLANT
DRAIN CHANNEL 19F RND (DRAIN) ×4 IMPLANT
DRAPE CHEST BREAST 15X10 FENES (DRAPES) ×2 IMPLANT
DRAPE LAPAROSCOPIC ABDOMINAL (DRAPES) ×2 IMPLANT
DRAPE ORTHO SPLIT 77X108 STRL (DRAPES) ×2
DRAPE PROXIMA HALF (DRAPES) ×2 IMPLANT
DRAPE SURG 17X23 STRL (DRAPES) ×8 IMPLANT
DRAPE SURG ORHT 6 SPLT 77X108 (DRAPES) ×2 IMPLANT
DRAPE UTILITY 15X26 W/TAPE STR (DRAPE) ×4 IMPLANT
DRAPE WARM FLUID 44X44 (DRAPE) ×2 IMPLANT
DRSG PAD ABDOMINAL 8X10 ST (GAUZE/BANDAGES/DRESSINGS) ×2 IMPLANT
DRSG TEGADERM 4X4.75 (GAUZE/BANDAGES/DRESSINGS) ×2 IMPLANT
ELECT BLADE 6.5 EXT (BLADE) ×4 IMPLANT
ELECT CAUTERY BLADE 6.4 (BLADE) ×4 IMPLANT
ELECT REM PT RETURN 9FT ADLT (ELECTROSURGICAL) ×4
ELECTRODE REM PT RTRN 9FT ADLT (ELECTROSURGICAL) ×2 IMPLANT
EVACUATOR SILICONE 100CC (DRAIN) ×4 IMPLANT
EVACUATOR SMOKE ACCUVAC VALLEY (FILTER) ×1
GLOVE BIO SURGEON STRL SZ7.5 (GLOVE) ×4 IMPLANT
GLOVE BIOGEL PI IND STRL 7.0 (GLOVE) ×2 IMPLANT
GLOVE BIOGEL PI IND STRL 7.5 (GLOVE) ×1 IMPLANT
GLOVE BIOGEL PI IND STRL 8 (GLOVE) ×2 IMPLANT
GLOVE BIOGEL PI INDICATOR 7.0 (GLOVE) ×2
GLOVE BIOGEL PI INDICATOR 7.5 (GLOVE) ×1
GLOVE BIOGEL PI INDICATOR 8 (GLOVE) ×2
GLOVE EUDERMIC 7 POWDERFREE (GLOVE) ×2 IMPLANT
GLOVE SURG SS PI 7.0 STRL IVOR (GLOVE) ×6 IMPLANT
GOWN STRL NON-REIN LRG LVL3 (GOWN DISPOSABLE) ×6 IMPLANT
GOWN STRL REIN XL XLG (GOWN DISPOSABLE) ×4 IMPLANT
GRAFT FLEX HD 8X16 THICK (Tissue Mesh) ×2 IMPLANT
IMPL BREAST TIS EXP M 450CC (Breast) ×1 IMPLANT
IMPLANT BREAST TIS EXP M 450CC (Breast) ×2 IMPLANT
KIT BASIN OR (CUSTOM PROCEDURE TRAY) ×4 IMPLANT
KIT MARKER MARGIN INK (KITS) ×2 IMPLANT
KIT ROOM TURNOVER OR (KITS) ×4 IMPLANT
MARKER SKIN DUAL TIP RULER LAB (MISCELLANEOUS) ×2 IMPLANT
NEEDLE 18GX1X1/2 (RX/OR ONLY) (NEEDLE) ×2 IMPLANT
NEEDLE HYPO 25GX1X1/2 BEV (NEEDLE) ×4 IMPLANT
NS IRRIG 1000ML POUR BTL (IV SOLUTION) ×8 IMPLANT
PACK GENERAL/GYN (CUSTOM PROCEDURE TRAY) ×4 IMPLANT
PACK SURGICAL SETUP 50X90 (CUSTOM PROCEDURE TRAY) IMPLANT
PAD ARMBOARD 7.5X6 YLW CONV (MISCELLANEOUS) ×6 IMPLANT
PENCIL BUTTON HOLSTER BLD 10FT (ELECTRODE) ×2 IMPLANT
PREFILTER EVAC NS 1 1/3-3/8IN (MISCELLANEOUS) ×2 IMPLANT
SPONGE GAUZE 4X4 12PLY (GAUZE/BANDAGES/DRESSINGS) ×2 IMPLANT
SPONGE LAP 18X18 X RAY DECT (DISPOSABLE) ×2 IMPLANT
SPONGE LAP 4X18 X RAY DECT (DISPOSABLE) ×2 IMPLANT
STAPLER VISISTAT 35W (STAPLE) IMPLANT
SUT MNCRL AB 3-0 PS2 18 (SUTURE) ×4 IMPLANT
SUT MNCRL AB 4-0 PS2 18 (SUTURE) ×2 IMPLANT
SUT MON AB 4-0 PC3 18 (SUTURE) ×4 IMPLANT
SUT PDS AB 3-0 SH 27 (SUTURE) ×6 IMPLANT
SUT PROLENE 3 0 PS 2 (SUTURE) ×4 IMPLANT
SUT SILK 3 0 (SUTURE) ×1
SUT SILK 3-0 18XBRD TIE 12 (SUTURE) ×1 IMPLANT
SUT VIC AB 3-0 SH 18 (SUTURE) ×4 IMPLANT
SYR BULB IRRIGATION 50ML (SYRINGE) ×2 IMPLANT
SYR CONTROL 10ML LL (SYRINGE) ×2 IMPLANT
TOWEL OR 17X24 6PK STRL BLUE (TOWEL DISPOSABLE) ×6 IMPLANT
TOWEL OR 17X26 10 PK STRL BLUE (TOWEL DISPOSABLE) ×4 IMPLANT
TRAY FOLEY CATH 14FRSI W/METER (CATHETERS) ×2 IMPLANT
TUBE CONNECTING 12X1/4 (SUCTIONS) ×2 IMPLANT
WATER STERILE IRR 1000ML POUR (IV SOLUTION) IMPLANT
YANKAUER SUCT BULB TIP NO VENT (SUCTIONS) IMPLANT

## 2013-06-02 NOTE — Brief Op Note (Signed)
06/02/2013  1:20 PM  PATIENT:  [name]  72 y.o. female  PRE-OPERATIVE DIAGNOSIS:  LEFT BREAST CANCER  POST-OPERATIVE DIAGNOSIS:  * No post-op diagnosis entered *  PROCEDURE:  Procedure(s) with comments: BREAST LUMPECTOMY WITH NEEDLE LOCALIZATION AND AXILLARY SENTINEL LYMPH NODE BX (Left) - NEEDLE LOC BCG 7:30 NUC MED 9:30 RECONSTRUCTION TO FOLLOW 1 HOUR added  REMOVAL BREAST IMPLANTS; LEFT BREAST CAPSULECTOMY WITH PLACEMENT OF TISSUE EXPANDER AND USE OF FLEX HD (Left)  SURGEON:  Surgeon(s) and Role: Panel 1:    * Currie Paris, MD - Primary  Panel 2:     * Etter Sjogren, MD - Primary    * Etter Sjogren, MD - Primary  PHYSICIAN ASSISTANT:   ASSISTANTS: none   ANESTHESIA:   general  EBL:  Total I/O In: 1500 [I.V.:1500] Out: 210 [Urine:160; Blood:50]  BLOOD ADMINISTERED:none  DRAINS: (2) Jackson-Pratt drain(s) with closed bulb suction in the left chest (one subglandular and one submuscular)   LOCAL MEDICATIONS USED:  NONE  SPECIMEN:  No Specimen  DISPOSITION OF SPECIMEN:  N/A  COUNTS:  YES  TOURNIQUET:  * No tourniquets in log *  DICTATION: .Other Dictation: Dictation Number [id number]  PLAN OF CARE: Admit to inpatient   PATIENT DISPOSITION:  PACU - hemodynamically stable.   Delay start of Pharmacological VTE agent (>24hrs) due to surgical blood loss or risk of bleeding: no

## 2013-06-02 NOTE — H&P (Signed)
  NAMEJASHIRA [name] DOB: 11/05/1940 MRN: [id number]                                                                                      DATE: 05/23/2013  PCP: [name], MD Referring Provider: No ref. provider found  IMPRESSION:  Left breast cancer with possible involvement of implant capsule  PLAN:   Left NL lumpectomy and removal of implant and capsule, left sentinel node removal                 CC: No chief complaint on file.   HPI:  [name] is a 72 y.o.  female who presents for surgery for her recent dx of left breast cancer. She has had extensive medical eval and is considered stable for surgery. Details are in Epic notes.  PMH:  has a past medical history of Other emphysema; Unspecified arthropathy, shoulder region; Gastric ulcer, unspecified as acute or chronic, without mention of hemorrhage, perforation, or obstruction; Benign paroxysmal positional vertigo; COPD (chronic obstructive pulmonary disease); Diverticula of colon; Osteopenia; Personal history of colonic polyps (2007); Diverticulitis; Hyperlipemia; Fracture of ankle, bimalleolar, right, closed; Hypoxemia; C. difficile diarrhea; Varicose vein; Asthma; GERD (gastroesophageal reflux disease); Hepatitis (1989); Arthritis; TIA (transient ischemic attack) (04/2008; 08/21/2012); On home oxygen therapy; Ischemic cardiomyopathy; Night terrors, adult; CVA (cerebral vascular accident); Stroke, acute, embolic; Breast cancer (04/08/13 bx); Allergy; CAD (coronary artery disease); and Hypertension.  PSH:   has past surgical history that includes Total shoulder arthroplasty (2012); ORIF ankle fracture (2007); martial megeti (6295'M); Varicose vein surgery (8413; 1985); Total shoulder replacement (12/05/2010); Abdominal hysterectomy; Augmentation mammaplasty (1975); Coronary angioplasty with stent (2010); Coronary angioplasty (1990); Colon surgery (07/07/2011); Appendectomy (1960); Nose surgery; BREAST AUGMENTTATION; and CATARCT   RIGHT.  ALLERGIES:   Allergies  Allergen Reactions  . Escitalopram Oxalate Shortness Of Breath, Diarrhea and Nausea And Vomiting    Hot flashes, dizziness  . Indomethacin Other (See Comments)    "made me very very dizzy and made me have vertigo" (08/31/2012)  . Codeine Nausea And Vomiting    "I can take codeine in my cough medicine" (08/31/2012)  . Cephalexin Other (See Comments)    Complicated by C dif colitis  . Sulfonamide Derivatives Other (See Comments)    Drug induced hepatitis in 1989    MEDICATIONS: Current facility-administered medications:enoxaparin (LOVENOX) injection 40 mg, 40 mg, Subcutaneous, Once, Etter Sjogren, MD;  lactated ringers infusion, , Intravenous, Continuous, Remonia Richter, MD, Last Rate: 20 mL/hr at 06/02/13 0959, 20 mL/hr at 06/02/13 0959;  vancomycin (VANCOCIN) IVPB 1000 mg/200 mL premix, 1,000 mg, Intravenous, On Call to OR, Currie Paris, MD   EXAM:   VS; BP 137/59  Pulse 78  Temp(Src) 97.1 F (36.2 C) (Oral)  Resp 20  SpO2 95% General: Alert, NAD, a little anxious Heart: Reg Lungs clear, but distant BS Breast - guide wire in place  DATA REVIEWED:  Physician notes in Epic    Missie Gehrig J 06/02/2013  CC: No ref. provider found, [name], MD

## 2013-06-02 NOTE — Progress Notes (Signed)
ANTIBIOTIC CONSULT NOTE - INITIAL  Pharmacy Consult for Vancomycin Indication: post-op prophylaxis; positive MRSA screen  Allergies  Allergen Reactions  . Escitalopram Oxalate Shortness Of Breath, Diarrhea and Nausea And Vomiting    Hot flashes, dizziness  . Indomethacin Other (See Comments)    "made me very very dizzy and made me have vertigo" (08/31/2012)  . Codeine Nausea And Vomiting    "I can take codeine in my cough medicine" (08/31/2012)  . Cephalexin Other (See Comments)    Complicated by C dif colitis  . Sulfonamide Derivatives Other (See Comments)    Drug induced hepatitis in 1989    Patient Measurements: Height: 5\' 4"  (162.6 cm) Weight: 177 lb 3.2 oz (80.377 kg) IBW/kg (Calculated) : 54.7  Vital Signs: Temp: 97.6 F (36.4 C) (08/14 1458) Temp src: Oral (08/14 1458) BP: 145/58 mmHg (08/14 1458) Pulse Rate: 86 (08/14 1458) Intake/Output from previous day:   Intake/Output from this shift: Total I/O In: 1600 [P.O.:100; I.V.:1500] Out: 510 [Urine:410; Drains:50; Blood:50]  Labs:  Recent Labs  05/31/13 1604  WBC 9.7  HGB 14.7  PLT 163  CREATININE 0.88   Estimated Creatinine Clearance: 59.3 ml/min (by C-G formula based on Cr of 0.88).   Microbiology: Recent Results (from the past 720 hour(s))  SURGICAL PCR SCREEN     Status: Abnormal   Collection Time    05/31/13  4:05 PM      Result Value Range Status   MRSA, PCR POSITIVE (*) NEGATIVE Final   Staphylococcus aureus POSITIVE (*) NEGATIVE Final   Comment:            The Xpert SA Assay (FDA     approved for NASAL specimens     in patients over 72 years of age),     is one component of     a comprehensive surveillance     program.  Test performance has     been validated by [employer] for patients greater     than or equal to 70 year old.     It is not intended     to diagnose infection nor to     guide or monitor treatment.    Medical History: Past Medical History  Diagnosis Date  .  Other emphysema   . Unspecified arthropathy, shoulder region   . Gastric ulcer, unspecified as acute or chronic, without mention of hemorrhage, perforation, or obstruction   . Benign paroxysmal positional vertigo   . COPD (chronic obstructive pulmonary disease)   . Diverticula of colon   . Osteopenia   . Personal history of colonic polyps 2007    tubular adenoma high grade dysplasia  . Diverticulitis   . Hyperlipemia   . Fracture of ankle, bimalleolar, right, closed   . Hypoxemia   . C. difficile diarrhea   . Varicose vein   . Asthma   . GERD (gastroesophageal reflux disease)   . Hepatitis 1989  . Arthritis     "both shoulders"   . TIA (transient ischemic attack) 04/2008; 08/21/2012    "slight droop left lower lip after 08/21/12 TIA"  . On home oxygen therapy   . Ischemic cardiomyopathy     a. EF 45-50% 2011. b. EF 35-40% by cath 09/01/12.  . Night terrors, adult     dr Pearlean Brownie referred  for PSG in 2011, and again in 2013   . CVA (cerebral vascular accident)   . Stroke, acute, embolic     Nov  2013 ,   [facility] , was treated with TPA.   . Breast cancer 04/08/13 bx    left  . Allergy   . CAD (coronary artery disease)     a. ant MI 40 tx with TPA/PTCA. b. Prior stenting procedures including RCA stent, DES to LCx 04/2011. c. [place] -> DES  to prox LAD 08/2012.   Marland Kitchen Hypertension     TAKES FOR H/O STROKE    Medications:  Prescriptions prior to admission  Medication Sig Dispense Refill  . albuterol (PROVENTIL HFA;VENTOLIN HFA) 108 (90 BASE) MCG/ACT inhaler Inhale 2 puffs into the lungs every 6 (six) hours as needed. For shortness of breath  3 Inhaler  3  . anastrozole (ARIMIDEX) 1 MG tablet Take 1 tablet (1 mg total) by mouth daily.  90 tablet  3  . aspirin EC 81 MG tablet Take 1 tablet (81 mg total) by mouth daily.      Marland Kitchen Avocado Oil OIL Take 1 capsule by mouth daily.       . benzonatate (TESSALON) 200 MG capsule Take 1 capsule (200 mg total) by mouth 3 (three) times daily as  needed for cough.  270 capsule  0  . calcium-vitamin D (OSCAL WITH D) 500-200 MG-UNIT per tablet Take 0.5 tablets by mouth 2 (two) times daily.       . Cholecalciferol (VITAMIN D3) 2000 UNITS TABS Take 2 tablets by mouth 2 (two) times daily.       . clonazePAM (KLONOPIN) 0.5 MG tablet Take 0.5 mg by mouth at bedtime.      . clopidogrel (PLAVIX) 75 MG tablet Take 1 tablet (75 mg total) by mouth daily.  90 tablet  4  . Cranberry-Olive Leaf (URINARY TRACT HEALTH) 365-50 MG CAPS Take 1 capsule by mouth daily.       Marland Kitchen docusate sodium (COLACE) 100 MG capsule Take 100 mg by mouth daily.      . Fluticasone-Salmeterol (ADVAIR DISKUS) 250-50 MCG/DOSE AEPB Inhale 1 puff into the lungs 2 (two) times daily.  1 each  0  . gabapentin (NEURONTIN) 300 MG capsule Take 1 capsule (300 mg total) by mouth Nightly.  90 capsule  3  . Garlic (ODORLESS GARLIC) 1250 MG TABS Take 1 tablet by mouth daily.       Marland Kitchen L-Lysine 500 MG TABS Take 1 tablet by mouth daily.       Marland Kitchen loratadine (CLARITIN) 10 MG tablet Take 10 mg by mouth daily.      . magnesium 30 MG tablet Take 30 mg by mouth 2 (two) times daily.      . metoprolol succinate (TOPROL-XL) 25 MG 24 hr tablet Take 12.5 mg by mouth daily.      . Multiple Vitamin (MULTIVITAMIN) capsule Take 0.5 capsules by mouth 2 (two) times daily.       . nitroGLYCERIN (NITROSTAT) 0.4 MG SL tablet Place 1 tablet (0.4 mg total) under the tongue every 5 (five) minutes as needed for chest pain (up to 3 doses).  25 tablet  4  . Omega-3 Fatty Acids (FISH OIL) 1200 MG CAPS Take 1 capsule by mouth every evening.       Marland Kitchen OVER THE COUNTER MEDICATION Take 1 tablet by mouth daily. Triple Magnesium Complex      . oxybutynin (DITROPAN) 5 MG tablet Take 2.5 mg by mouth at bedtime.      Marland Kitchen oxyCODONE-acetaminophen (PERCOCET/ROXICET) 5-325 MG per tablet Take 1-2 tablets by mouth every 4 (four) hours as  needed for pain.      . pantoprazole (PROTONIX) 40 MG tablet Take 40 mg by mouth daily.      . Probiotic  Product (PROBIOTIC DAILY PO) Take 1 tablet by mouth daily.       . simvastatin (ZOCOR) 40 MG tablet Take 40 mg by mouth every evening.      . tiotropium (SPIRIVA HANDIHALER) 18 MCG inhalation capsule Place 1 capsule (18 mcg total) into inhaler and inhale daily.  10 capsule  0  . vitamin B-12 (CYANOCOBALAMIN) 1000 MCG tablet Take 1,000 mcg by mouth daily.        Marland Kitchen alendronate (FOSAMAX) 70 MG tablet Take 1 tablet (70 mg total) by mouth every 7 (seven) days. Take with a full glass of water on an empty stomach.  12 tablet  1   Assessment: 72 yo F admitted for L breast reconstruction s/p L breast cancer.  Pt has positive MRSA nasal swab on admission and physician would like to continue post-op prophylaxis with Vancomycin.   Pt received Vancomycin 1gm pre-op at 1030 today.  SCr 0.88 with CrCl ~ 60 ml/min.  Goal of Therapy:  Vancomycin trough level 10-15 mcg/ml  Plan:  Begin Vancomycin 1250 mg IV q24h.  Next dose due tomorrow 1000 AM.  Dixie Dials, Pharm.D., BCPS Clinical Pharmacist Pager [phone] 06/02/2013 4:39 PM

## 2013-06-02 NOTE — Anesthesia Procedure Notes (Signed)
Procedure Name: Intubation Date/Time: 06/02/2013 10:28 AM Performed by: Elon Alas Pre-anesthesia Checklist: Patient identified, Timeout performed, Emergency Drugs available, Suction available and Patient being monitored Patient Re-evaluated:Patient Re-evaluated prior to inductionOxygen Delivery Method: Circle system utilized Preoxygenation: Pre-oxygenation with 100% oxygen Intubation Type: IV induction Ventilation: Mask ventilation without difficulty Laryngoscope Size: Mac and 3 Grade View: Grade II Tube type: Oral Tube size: 7.0 mm Number of attempts: 1 Airway Equipment and Method: Stylet Placement Confirmation: positive ETCO2,  ETT inserted through vocal cords under direct vision and breath sounds checked- equal and bilateral Secured at: 21 cm Tube secured with: Tape Dental Injury: Teeth and Oropharynx as per pre-operative assessment

## 2013-06-02 NOTE — Transfer of Care (Signed)
Immediate Anesthesia Transfer of Care Note  Patient: [name]  Procedure(s) Performed: Procedure(s) with comments: BREAST LUMPECTOMY WITH NEEDLE LOCALIZATION AND AXILLARY SENTINEL LYMPH NODE BX (Left) - NEEDLE LOC BCG 7:30 NUC MED 9:30 RECONSTRUCTION TO FOLLOW 1 HOUR added  REMOVAL BREAST IMPLANTS; LEFT BREAST CAPSULECTOMY WITH PLACEMENT OF TISSUE EXPANDER AND USE OF FLEX HD (Left)  Patient Location: PACU  Anesthesia Type:General  Level of Consciousness: awake, alert  and oriented  Airway & Oxygen Therapy: Patient Spontanous Breathing and Patient connected to nasal cannula oxygen  Post-op Assessment: Report given to PACU RN, Post -op Vital signs reviewed and stable and Patient moving all extremities X 4  Post vital signs: Reviewed and stable  Complications: No apparent anesthesia complications

## 2013-06-02 NOTE — Anesthesia Preprocedure Evaluation (Addendum)
Anesthesia Evaluation  Patient identified by MRN, date of birth, ID band Patient awake    Reviewed: Allergy & Precautions, H&P , NPO status , Patient's Chart, lab work & pertinent test results, reviewed documented beta blocker date and time   History of Anesthesia Complications Negative for: history of anesthetic complications  Airway Mallampati: II TM Distance: >3 FB Neck ROM: Full    Dental  (+) Edentulous Upper, Partial Lower and Dental Advisory Given   Pulmonary asthma , COPD COPD inhaler,    Pulmonary exam normal       Cardiovascular hypertension, Pt. on home beta blockers + angina + CAD, + Past MI, + Cardiac Stents and + Peripheral Vascular Disease  Nuclear Study, 04/2013  Intermediate risk stress nuclear study with large, severe, fixed defect in the anterior wall, septum and apex consistent with prior infarct; no ischemia. LV Ejection Fraction: 59%.  LV Wall Motion:  Akinesis of the anterior wall and apex   Neuro/Psych TIACVA negative psych ROS   GI/Hepatic Neg liver ROS, PUD, GERD-  Medicated,  Endo/Other  negative endocrine ROS  Renal/GU negative Renal ROS     Musculoskeletal negative musculoskeletal ROS (+)   Abdominal   Peds  Hematology negative hematology ROS (+)   Anesthesia Other Findings   Reproductive/Obstetrics negative OB ROS                       Anesthesia Physical Anesthesia Plan  ASA: III  Anesthesia Plan: General   Post-op Pain Management:    Induction: Intravenous  Airway Management Planned: Oral ETT  Additional Equipment:   Intra-op Plan:   Post-operative Plan: Extubation in OR  Informed Consent: I have reviewed the patients History and Physical, chart, labs and discussed the procedure including the risks, benefits and alternatives for the proposed anesthesia with the patient or authorized representative who has indicated his/her understanding and acceptance.    Dental advisory given  Plan Discussed with: CRNA, Anesthesiologist and Surgeon  Anesthesia Plan Comments:        Anesthesia Quick Evaluation

## 2013-06-02 NOTE — Anesthesia Postprocedure Evaluation (Signed)
Anesthesia Post Note  Patient: [name]  Procedure(s) Performed: Procedure(s) (LRB): BREAST LUMPECTOMY WITH NEEDLE LOCALIZATION AND AXILLARY SENTINEL LYMPH NODE BX (Left) REMOVAL BREAST IMPLANTS; LEFT BREAST CAPSULECTOMY WITH PLACEMENT OF TISSUE EXPANDER AND USE OF FLEX HD (Left)  Anesthesia type: general  Patient location: PACU  Post pain: Pain level controlled  Post assessment: Patient's Cardiovascular Status Stable  Last Vitals:  Filed Vitals:   06/02/13 1435  BP:   Pulse: 76  Temp: 36.4 C  Resp: 11    Post vital signs: Reviewed and stable  Level of consciousness: sedated  Complications: No apparent anesthesia complications

## 2013-06-02 NOTE — Op Note (Signed)
[name] 11-10-1940 [id number] 05/23/2013  Preoperative diagnosis: left breast cancer, lower inner quadrant  Postoperative diagnosis: same  Procedure: wire localized left lumpectomy, blue dye injection, left axillary sentinel lymph node dissection  Surgeon: Currie Paris, MD, FACS  .  Anesthesia: General   Clinical History and Indications: this patient was recently found to have a left breast cancer in the medial aspect of her left breast. It appear to abut and possibly involving the capsule of a subglandular breast implant. After review of her situation we elected to do a left lumpectomy with removal of the implant and capsule, sentinel lymph node evaluation, and reconstruction of the breast.    Description of Procedure:the patient was seen in the preoperative area, the plans confirmed, for localizing films reviewed, and the left breast marked as the operative side. The patient was then taken to the operating room and after satisfactory general anesthesia was obtained a Foley catheter was placed in a time out was done. 5 cc of dilute methylene blue was injected in the subareolar area and massaged in. A full prep and drape and a second time out was performed.  I made a short transverse axillary incision after using the neoprobe to identify a hot area. In the anterior fat-pad I saw blue lymphatic leading to a slightly blue lymph node which had counts of about 5-600. It was soft and clinically normal. It was excised. There were no other palpably abnormal nodes, no other blue lymphatics or blue lymph nodes, and no other hot areas. The incision was closed in layers with 3-0 Vicryl followed by 4-0 Monocryl subcuticular.  Attention was then turned to the lumpectomy. The guidewire entered in the lower inner quadrant and seemed to track medial and superior. A definite mass is not palpable. I made a transverse incision over the approximate area of the mass and elevated and very thin skin flap  inferiorly and manipulate the guidewire into the wound. Medially and inferiorly I divided the fatty tissue down to the chest wall. A raised he then superior flap and began going down towards the chest wall. I extended the skin flap inferiorly and began become a little bit under the capsule. Then I made a thin skin flap laterally towards the areola and divided down towards the capsule which was very superficial.  This point the lumpectomy was done with the exception of where was attached to the capsule. Dr. Odis Luster, who was scrubbed in at that time, then excised the entire capsule with the implant. A specimen mammogram showed the tumor in the midst of the lumpectomy portion of the specimen. Both by visualization and by the mammogram I appeared to have good margins.  At this point Dr. Odis Luster completed the case doing the reconstruction Currie Paris, MD, FACS 06/02/2013 11:52 AM

## 2013-06-02 NOTE — Telephone Encounter (Signed)
I spoke to Dr Einar Gip

## 2013-06-02 NOTE — Progress Notes (Signed)
Pt noted to bleeding from JP drains site large enough to saturate half of incontinent pad on bed. Pt cleaned and 3 drain sponges applied to site. In about an hour and a half, drain sponges also saturated with blood st site. MD notified. New order given for cbc at 8pm and to reinforce dsg at site with abd pads. Done . Ofilia Neas

## 2013-06-02 NOTE — Consult Note (Signed)
PULMONARY  / CRITICAL CARE MEDICINE  Name: [name] MRN: [id number] DOB: July 27, 1941    ADMISSION DATE:  06/02/2013 CONSULTATION DATE:  06/02/2013  REFERRING MD :  06/02/13 PRIMARY SERVICE:  Streck  CHIEF COMPLAINT:  S/p breast lumpectomy   BRIEF PATIENT DESCRIPTION: 72 y/o F with hx of asthma, GERD, ischemic cardiomyopathy, stroke, HTN, COPD and L sided breast cancer presents s/p L breast lumpectomy on 06/02/13. PCCM was consulted for hx of COPD.   SIGNIFICANT EVENTS / STUDIES:  06/02/13 - breast lumpectomy with needle localization and axillary sentinel lymph node bx, removal of breast implant  LINES / TUBES: 8/14 JP L Lateral chest>>> 8/14 JP L Medial chest>>>  CULTURES:   ANTIBIOTICS:   HISTORY OF PRESENT ILLNESS: 72 y/o F with hx of asthma, GERD, ischemic cardiomyopathy, stroke, HTN, COPD and breast cancer presents s/p breast lumpectomy on 06/02/13. Patient also had L lymph node bx and removal of L breast implant. Patient is on 2L O2 at home and sees Dr. Vassie Loll at the office. Family at beside states previous post op stays have resulted in delayed COPD tx which caused delayed hospital stays.   PAST MEDICAL HISTORY :  Past Medical History  Diagnosis Date  . Other emphysema   . Unspecified arthropathy, shoulder region   . Gastric ulcer, unspecified as acute or chronic, without mention of hemorrhage, perforation, or obstruction   . Benign paroxysmal positional vertigo   . COPD (chronic obstructive pulmonary disease)   . Diverticula of colon   . Osteopenia   . Personal history of colonic polyps 2007    tubular adenoma high grade dysplasia  . Diverticulitis   . Hyperlipemia   . Fracture of ankle, bimalleolar, right, closed   . Hypoxemia   . C. difficile diarrhea   . Varicose vein   . Asthma   . GERD (gastroesophageal reflux disease)   . Hepatitis 1989  . Arthritis     "both shoulders"   . TIA (transient ischemic attack) 04/2008; 08/21/2012    "slight droop left lower  lip after 08/21/12 TIA"  . On home oxygen therapy   . Ischemic cardiomyopathy     a. EF 45-50% 2011. b. EF 35-40% by cath 09/01/12.  . Night terrors, adult     dr Pearlean Brownie referred  for PSG in 2011, and again in 2013   . CVA (cerebral vascular accident)   . Stroke, acute, embolic     Nov 2013 , [place] , was treated with TPA.   . Breast cancer 04/08/13 bx    left  . Allergy   . CAD (coronary artery disease)     a. ant MI 49 tx with TPA/PTCA. b. Prior stenting procedures including RCA stent, DES to LCx 04/2011. c. [place] -> DES  to prox LAD 08/2012.   Marland Kitchen Hypertension     TAKES FOR H/O STROKE   Past Surgical History  Procedure Laterality Date  . Total shoulder arthroplasty  2012    left  . Orif ankle fracture  2007    right  . Martial megeti  4098'J    "bladder operation; not successful " (08/31/2012)  . Varicose vein surgery  1983; 1985    "both legs both times" (08/31/2012)  . Total shoulder replacement  12/05/2010    Dr Rennis Chris  . Abdominal hysterectomy      TAH w/BSO  . Augmentation mammaplasty  1975  . Coronary angioplasty with stent placement  2010  . Coronary angioplasty  1990    "3 times" (08/31/2012)  . Colon surgery  07/07/2011    Dr. [name]  . Appendectomy  1960  . Nose surgery      1977  . Breast augmenttation    . Catarct  right      RIGHT EYE   Prior to Admission medications   Medication Sig Start Date End Date Taking? Authorizing Provider  albuterol (PROVENTIL HFA;VENTOLIN HFA) 108 (90 BASE) MCG/ACT inhaler Inhale 2 puffs into the lungs every 6 (six) hours as needed. For shortness of breath 06/01/13  Yes Oretha Milch, MD  anastrozole (ARIMIDEX) 1 MG tablet Take 1 tablet (1 mg total) by mouth daily. 05/19/13  Yes Lowella Dell, MD  aspirin EC 81 MG tablet Take 1 tablet (81 mg total) by mouth daily. 09/02/12  Yes [name] N Dunn, PA-C  Avocado Oil OIL Take 1 capsule by mouth daily.    Yes Historical Provider, MD  benzonatate (TESSALON) 200 MG capsule Take 1  capsule (200 mg total) by mouth 3 (three) times daily as needed for cough. 01/18/13  Yes Oretha Milch, MD  calcium-vitamin D (OSCAL WITH D) 500-200 MG-UNIT per tablet Take 0.5 tablets by mouth 2 (two) times daily.    Yes Historical Provider, MD  Cholecalciferol (VITAMIN D3) 2000 UNITS TABS Take 2 tablets by mouth 2 (two) times daily.    Yes Historical Provider, MD  clonazePAM (KLONOPIN) 0.5 MG tablet Take 0.5 mg by mouth at bedtime.   Yes Historical Provider, MD  clopidogrel (PLAVIX) 75 MG tablet Take 1 tablet (75 mg total) by mouth daily. 11/25/12  Yes Kathleene Hazel, MD  Cranberry-Olive Leaf (URINARY TRACT HEALTH) 365-50 MG CAPS Take 1 capsule by mouth daily.    Yes Historical Provider, MD  docusate sodium (COLACE) 100 MG capsule Take 100 mg by mouth daily.   Yes Historical Provider, MD  Fluticasone-Salmeterol (ADVAIR DISKUS) 250-50 MCG/DOSE AEPB Inhale 1 puff into the lungs 2 (two) times daily. 06/01/13  Yes Oretha Milch, MD  gabapentin (NEURONTIN) 300 MG capsule Take 1 capsule (300 mg total) by mouth Nightly. 04/19/13  Yes Carmen Dohmeier, MD  Garlic (ODORLESS GARLIC) 1250 MG TABS Take 1 tablet by mouth daily.    Yes Historical Provider, MD  L-Lysine 500 MG TABS Take 1 tablet by mouth daily.    Yes Historical Provider, MD  loratadine (CLARITIN) 10 MG tablet Take 10 mg by mouth daily.   Yes Historical Provider, MD  magnesium 30 MG tablet Take 30 mg by mouth 2 (two) times daily.   Yes Historical Provider, MD  metoprolol succinate (TOPROL-XL) 25 MG 24 hr tablet Take 12.5 mg by mouth daily.   Yes Historical Provider, MD  Multiple Vitamin (MULTIVITAMIN) capsule Take 0.5 capsules by mouth 2 (two) times daily.    Yes Historical Provider, MD  nitroGLYCERIN (NITROSTAT) 0.4 MG SL tablet Place 1 tablet (0.4 mg total) under the tongue every 5 (five) minutes as needed for chest pain (up to 3 doses). 09/02/12  Yes [name] N Dunn, PA-C  Omega-3 Fatty Acids (FISH OIL) 1200 MG CAPS Take 1 capsule by mouth every  evening.    Yes Historical Provider, MD  OVER THE COUNTER MEDICATION Take 1 tablet by mouth daily. Triple Magnesium Complex   Yes Historical Provider, MD  oxybutynin (DITROPAN) 5 MG tablet Take 2.5 mg by mouth at bedtime.   Yes Historical Provider, MD  oxyCODONE-acetaminophen (PERCOCET/ROXICET) 5-325 MG per tablet Take 1-2 tablets by mouth every 4 (four)  hours as needed for pain.   Yes Historical Provider, MD  pantoprazole (PROTONIX) 40 MG tablet Take 40 mg by mouth daily.   Yes Historical Provider, MD  Probiotic Product (PROBIOTIC DAILY PO) Take 1 tablet by mouth daily.    Yes Historical Provider, MD  simvastatin (ZOCOR) 40 MG tablet Take 40 mg by mouth every evening.   Yes Historical Provider, MD  tiotropium (SPIRIVA HANDIHALER) 18 MCG inhalation capsule Place 1 capsule (18 mcg total) into inhaler and inhale daily. 06/01/13  Yes Oretha Milch, MD  vitamin B-12 (CYANOCOBALAMIN) 1000 MCG tablet Take 1,000 mcg by mouth daily.     Yes Historical Provider, MD  alendronate (FOSAMAX) 70 MG tablet Take 1 tablet (70 mg total) by mouth every 7 (seven) days. Take with a full glass of water on an empty stomach. 11/25/12   Kathleene Hazel, MD   Allergies  Allergen Reactions  . Escitalopram Oxalate Shortness Of Breath, Diarrhea and Nausea And Vomiting    Hot flashes, dizziness  . Indomethacin Other (See Comments)    "made me very very dizzy and made me have vertigo" (08/31/2012)  . Codeine Nausea And Vomiting    "I can take codeine in my cough medicine" (08/31/2012)  . Cephalexin Other (See Comments)    Complicated by C dif colitis  . Sulfonamide Derivatives Other (See Comments)    Drug induced hepatitis in 1989    FAMILY HISTORY:  Family History  Problem Relation Age of Onset  . Breast cancer Sister     x 2  . Colon cancer Neg Hx   . Diabetes Paternal Grandmother   . Uterine cancer Paternal Grandmother   . Lung cancer Paternal Grandmother   . Heart disease Mother   . Heart disease Father    . Pancreatic cancer Cousin   . Colon polyps Sister   . Diabetes Maternal Uncle   . Heart disease Maternal Grandfather   . Heart disease Paternal Grandfather   . Stroke Paternal Grandfather    SOCIAL HISTORY:  reports that she quit smoking about 15 years ago. Her smoking use included Cigarettes. She has a 40 pack-year smoking history. She has never used smokeless tobacco. She reports that she drinks about 1.8 ounces of alcohol per week. She reports that she does not use illicit drugs.  REVIEW OF SYSTEMS:   Constitutional: Negative for fever, chills, weight loss, malaise/fatigue and diaphoresis.  HENT: Negative for hearing loss, ear pain, nosebleeds, congestion, sore throat, neck pain, tinnitus and ear discharge.   Eyes: Negative for blurred vision, double vision, photophobia, pain, discharge and redness.  Respiratory: Negative for cough, hemoptysis, sputum production, shortness of breath, wheezing and stridor.   Cardiovascular: Negative for chest pain, palpitations, orthopnea, claudication, leg swelling and PND.  Gastrointestinal: Negative for heartburn, nausea, vomiting, abdominal pain, diarrhea, constipation, blood in stool and melena.  Genitourinary: Negative for dysuria, urgency, frequency, hematuria and flank pain.  Musculoskeletal: Negative for myalgias, back pain, joint pain and falls.  Skin: Negative for itching and rash.  Neurological: Negative for dizziness, tingling, tremors, sensory change, speech change, focal weakness, seizures, loss of consciousness, weakness and headaches.  Endo/Heme/Allergies: Negative for environmental allergies and polydipsia. Does not bruise/bleed easily.  SUBJECTIVE: Patient has no acute complaints. Resting comfortably in bed with family by bedside.   VITAL SIGNS: Temp:  [97.1 F (36.2 C)-97.6 F (36.4 C)] 97.6 F (36.4 C) (08/14 1458) Pulse Rate:  [74-86] 86 (08/14 1458) Resp:  [11-20] 16 (08/14 1458) BP: (137-145)/(58-59) 145/58  mmHg (08/14  1458) SpO2:  [95 %-99 %] 98 % (08/14 1458) Weight:  [177 lb 3.2 oz (80.377 kg)] 177 lb 3.2 oz (80.377 kg) (08/14 1458)  PHYSICAL EXAMINATION: General:  WDWN female, in NAD Neuro:  S/p anesthesia, no gross neuro deficits HEENT:  PERRL Cardiovascular:  RRR, no m/r/g Lungs:  CTA, no wheezing, resp's even bil, on 3L [place]  Abdomen:  BS+, NT, ND, no masses palpated Musculoskeletal:  No gross deformites  Skin:  L chest incision s/p L sided lumpectomy 8/14   Recent Labs Lab 05/31/13 1604  NA 141  K 4.7  CL 104  CO2 28  BUN 17  CREATININE 0.88  GLUCOSE 85    Recent Labs Lab 05/31/13 1604  HGB 14.7  HCT 43.0  WBC 9.7  PLT 163   CXR: 8/12 - emphysema  ASSESSMENT / PLAN: A: S/p L breast lumpectomy in setting of L breast cancer Hx of COPD R/o aspiration pna s/p lumpectomy  P:  - Maintain sats at SpO2 88-92% - Continue O2 therapy, pt 2L at home - Initiate duoneb q6  - Initiate Albuterol nebulizer PRN - Initiate Advair BID - CXR in AM to r/o aspiration pna - Initiate incentive spirometry  Will follow up in AM.  Alyson Reedy, M.D. Pulmonary and Critical Care Medicine [facility] Pager: [phone]  06/02/2013, 4:06 PM

## 2013-06-02 NOTE — Preoperative (Signed)
Beta Blockers   Reason not to administer Beta Blockers:Not Applicable 

## 2013-06-03 ENCOUNTER — Inpatient Hospital Stay (HOSPITAL_COMMUNITY): Payer: Medicare Other

## 2013-06-03 ENCOUNTER — Encounter (HOSPITAL_COMMUNITY): Payer: Self-pay | Admitting: Surgery

## 2013-06-03 DIAGNOSIS — J4489 Other specified chronic obstructive pulmonary disease: Secondary | ICD-10-CM

## 2013-06-03 DIAGNOSIS — J449 Chronic obstructive pulmonary disease, unspecified: Secondary | ICD-10-CM

## 2013-06-03 MED ORDER — HYDROMORPHONE HCL PF 1 MG/ML IJ SOLN
1.0000 mg | INTRAMUSCULAR | Status: DC | PRN
  Administered 2013-06-03: 2 mg via INTRAVENOUS
  Filled 2013-06-03: qty 2

## 2013-06-03 MED ORDER — MUPIROCIN CALCIUM 2 % EX CREA
TOPICAL_CREAM | Freq: Two times a day (BID) | CUTANEOUS | Status: DC
  Administered 2013-06-03 – 2013-06-04 (×2): via TOPICAL
  Filled 2013-06-03 (×2): qty 15

## 2013-06-03 MED ORDER — ALBUTEROL SULFATE (5 MG/ML) 0.5% IN NEBU
2.5000 mg | INHALATION_SOLUTION | Freq: Two times a day (BID) | RESPIRATORY_TRACT | Status: DC
  Administered 2013-06-03 – 2013-06-04 (×3): 2.5 mg via RESPIRATORY_TRACT
  Filled 2013-06-03 (×2): qty 0.5

## 2013-06-03 NOTE — Progress Notes (Signed)
Offered to ambulate with patient but patient refused. Will try again later.

## 2013-06-03 NOTE — Progress Notes (Signed)
Patient refusing Chest X ray.

## 2013-06-03 NOTE — Progress Notes (Signed)
Subjective: Sore. Feels too bad to go home.  Objective: Vital signs in last 24 hours: Temp:  [97.5 F (36.4 C)-98.5 F (36.9 C)] 98.3 F (36.8 C) (08/15 0500) Pulse Rate:  [74-91] 85 (08/15 0500) Resp:  [11-20] 20 (08/15 0500) BP: (113-145)/(54-93) 124/70 mmHg (08/15 0500) SpO2:  [94 %-99 %] 94 % (08/15 0500) Weight:  [177 lb 3.2 oz (80.377 kg)] 177 lb 3.2 oz (80.377 kg) (08/14 1458)  Intake/Output from previous day: 08/14 0701 - 08/15 0700 In: 1891.3 [P.O.:340; I.V.:1551.3] Out: 700 [Urine:410; Drains:240; Blood:50] Intake/Output this shift:    Operative Sites:Tissue expander in good position. Drains functioning. Drainage thin. There is no evidence of bleeding or infection.   Recent Labs  05/31/13 1604 06/02/13 2035  WBC 9.7 8.2  HGB 14.7 12.7  HCT 43.0 38.2  NA 141  --   K 4.7  --   CL 104  --   CO2 28  --   BUN 17  --   CREATININE 0.88  --     Studies/Results: Nm Sentinel Node Inj-no Rpt (breast)  06/02/2013   CLINICAL DATA: left breast cancer   Sulfur colloid was injected intradermally by the nuclear medicine  technologist for breast cancer sentinel node localization.    Mm Lt Plc Breast Loc Dev   1st Lesion  Inc Mammo Guide  06/02/2013   NEEDLE LOCALIZATION USING ULTRASOUND GUIDANCE AND SPECIMEN RADIOGRAPH  Comparison: Previous exams.  Patient presents for needle localization prior to lumpectomy.  I met with the patient and we discussed the procedure of needle localization including benefits and alternatives. We discussed the high likelihood of a successful procedure. We discussed the risks of the procedure, including infection, bleeding, tissue injury, and further surgery. Informed, written consent was given. The usual time-out protocol was performed immediately prior to the procedure.  Using ultrasound guidance, sterile technique, 2% lidocaine and a 5 cm modified Kopans needle, a mass was localized using a medial to lateral approach.  The films are marked for Dr.  Jamey Ripa.  Specimen radiograph is performed, and confirms the hookwire and clip to be present in the tissue sample.  The specimen is marked for pathology.  IMPRESSION: Needle localization of the left breast.  No apparent complications.   Original Report Authenticated By: Jerene Dilling, M.D.    Assessment/Plan: Ambulate. Encourage spirometry.   LOS: 1 day    Devarious Pavek M 06/03/2013 1:08 PM

## 2013-06-03 NOTE — Op Note (Signed)
NAMEMarland [name]  ESPERANSA, SARABIA NO.:  [id number]  MEDICAL RECORD NO.:  [id number]  LOCATION:                               FACILITY:  [facility]  PHYSICIAN:  Etter Sjogren, M.D.     DATE OF BIRTH:  18-Jan-1941  DATE OF PROCEDURE:  06/02/2013 DATE OF DISCHARGE:  06/04/2013                              OPERATIVE REPORT   PREOPERATIVE DIAGNOSIS:  Left breast cancer.  POSTOPERATIVE DIAGNOSIS:  Left breast cancer.  PROCEDURE PERFORMED: 1. Total capsulectomy due to cancer adherent to underline breast     implant capsule. 2. Explantation, left side. 3. Immediate breast reconstruction with tissue expander. 4. Chest wall reconstruction with greater than 100 cm2 of acellular     dermal matrix (flex HD) due to an adequate muscle of the chest wall     and inframammary crease reset.  SURGEON:  Etter Sjogren, MD  ANESTHESIA:  General.  ESTIMATED BLOOD LOSS:  20 mL.  DRAINS:  Two 19-French.  CLINICAL:  This 72 year old woman has a left breast cancer in the lower inner quadrant.  She has had previous breast augmentations subglandular silicone gel implant.  The breast carcinoma is attached to the underlying breast implant capsule.  The lumpectomy was discussed with her and that was her decision to proceed with that.  She understood that she may need to have radiation.  With the carcinoma attached to the underlying capsule, General Surgery requested Plastic Surgery consultation for total capsulectomy at that time in order to achieve margins for the cancer.  In addition, the patient desired immediate breast reconstruction.  Options were discussed and she elected to have placement of a tissue expander in a submuscular position and then wait and see if she needed to have radiation or not.  These procedures were discussed with her in great detail.  Risks plus complications were discussed that included but not limited to, bleeding, infection, healing problems, scarring, loss of sensation,  fluid accumulations, contour deformities, depressed areas, failure of device, capsular contracture, displacement of device, wrinkles, ripples, pneumothorax, deep VT, pulmonary embolism, and asymmetry disappointment, chronic pain, and the possibility of failure of the reconstruction, damage to the overlying nipple loss of sensation and the nipple loss of the nipple and loss of tissue.  She clearly understood that radiation would change her left breast with respect to the right and with a significant asymmetry.  She also understood the use of flex HD, if there was an adequate muscle for coverage of the underlying tissue expander.  She understood the nature that material as well as the rationale for its use.  She also understood the use of a anticoagulant in order to achieve prophylaxis for DVT.  She understood all of this and wished to proceed.  DESCRIPTION OF PROCEDURE:  The patient was in the operating room. Sentinel lymph node biopsy had been performed.  The lumpectomy was then performed by Dr. Jamey Ripa and once this had been achieved around the breast flap itself then the posterior margin was in fact the underlying capsule.  Through the lumpectomy approach, the dissection was then continued around the underlying implant and the capsule and the implants were removed.  The implant weight 245  g.  This was a total capsulectomy. Great care was taken to avoid damage to underlying pectoralis muscle. Once this had been completed after changing gloves and changing instruments, the dissection carried deep to the pectoralis.  The pectoralis was divided along its inferior border and submuscular space was then developed, taking great care to avoid damage to underlying chest cavity.  Dissection was also performed laterally.  Thorough irrigation with saline as well as antibiotic solution.  Meticulous hemostasis with electrocautery.  The flex HD was prepared soaking it 1st in saline and then antibiotic  solution for a total soak of 20 minutes. This was an 8 x 16 piece of flex HD.  Two drains were positioned, 1 in the plane submuscular position and one in the subglandular position and these were brought out through separate stab wounds inferolaterally and secured with 3-0 Prolene sutures.  The expander was then prepared again after thoroughly cleaning gloves.  This was a mentor 450 mL tissue expander moderate height.  The 150 mL sterile saline placed using a closed filling system.  The expander was soaked in antibiotic solution for greater than 5 minutes.  The expander was kept in antibiotic solution and the flex HD was then brought into the field.  Care was taken to position with the epidermal side down towards the underlying tissue expander and the dermal side facing up against the under surface of the breast gland.  This was secured around its periphery beginning laterally with a 3-0 PDS running simple suture.  Several different runs were made in order to have the strength to this insetting of the flex HD.  Great care was taken to avoid any wrinkles in the flex HD.  The medial aspect was left open since the expander to be positioned.  Again after thoroughly cleaning gloves, antibiotic solution was placed in the space.  The expander was positioned and care was taken to make sure it was oriented properly.  The remainder of the closure was completed with the tissue expander under direct vision and the flex HD that secured all the way around the periphery to inframammary crease as well as to the edge of the pectoralis muscle.  The closed filling system was then used directly into the valve to place a total of 350 mL of sterile saline. 150 mL had been placed initially to prepare the expander and then additional 200 for the total intraoperatively was 350 mL.  Irrigation with state antibiotic solution, and then meticulous hemostasis was achieved with electrocautery.  The wound was closed with  3-0 Monocryl simple interrupted suture deep, 3-0 Monocryl interrupted inverted deep dermal sutures and a running 4-0 Monocryl subcuticular suture.  The drains had been secured with 3-0 Prolene sutures and they were dressed with a Biopatch and sterile Tegaderm and the Dermabond was applied to the wound, and she was placed in ABDs with a chest vest and transferred to the recovery room stable having tolerated procedure well.     Etter Sjogren, M.D.     DB/MEDQ  D:  06/02/2013  T:  06/02/2013  Job:  [id number]

## 2013-06-03 NOTE — Progress Notes (Signed)
Daughter in for visit, updated about the patient status and behavior.

## 2013-06-03 NOTE — Progress Notes (Signed)
1 Day Post-Op   Assessment: s/p Procedure(s): BREAST LUMPECTOMY WITH NEEDLE LOCALIZATION AND AXILLARY SENTINEL LYMPH NODE BX REMOVAL BREAST IMPLANTS; LEFT BREAST CAPSULECTOMY WITH PLACEMENT OF TISSUE EXPANDER AND USE OF FLEX HD Patient Active Problem List   Diagnosis Date Noted  . Breast cancer, left breast 04/14/2013    Priority: High  . COPD exacerbation 06/02/2013  . Breast cancer   . Cancer of lower-inner quadrant of left female breast 04/12/2013  . Night terrors, adult   . Stroke, acute, embolic   . Unstable angina 09/02/2012  . Abdominal pain 03/26/2012  . Colon cancer 06/27/2011  . Colitis 06/13/2011  . Atypical back pain 06/13/2011  . Hypertension 03/27/2011  . Hyperlipemia 03/27/2011  . Iron deficiency anemia, unspecified 03/27/2011  . Constipation, acute 03/27/2011  . History of peptic ulcer disease 03/27/2011  . HYPERTENSION, BENIGN 12/02/2010  . CARDIOMYOPATHY, ISCHEMIC 09/24/2010  . BENIGN POSITIONAL VERTIGO 05/24/2010  . ACUTE BRONCHITIS 03/25/2010  . DIVERTICULITIS-COLON 11/15/2009  . HYPOXEMIA 07/25/2009  . CVA 07/24/2009  . HYPERLIPIDEMIA-MIXED 05/07/2009  . DIARRHEA-PRESUMED INFECTIOUS 11/01/2008  . ACUT GASTR ULCER W/PERF W/O MENTION OBSTRUCTION 09/15/2008  . Diverticulosis of Colon (without Mention of Hemorrhage) 09/15/2008  . Personal History of Colonic Polyps 09/15/2008  . CANDIDIASIS 09/01/2008  . GASTRIC ULCER 09/01/2008  . ARTHRITIS, LEFT SHOULDER 09/01/2008  . CEREBROVASCULAR ACCIDENT, HX OF 09/01/2008  . DIVERTICULITIS, HX OF 09/01/2008  . EXTERNAL OTITIS 12/03/2007  . COPD 10/11/2007  . CELLULITIS/ABSCESS NOS 06/11/2007  . MYOCARDIAL INFARCTION, HX OF 11/13/2006  . CORONARY ARTERY DISEASE 11/13/2006  . WOUND INFECTION 11/13/2006  . OSTEOPENIA 11/13/2006  . FRACTURE, ANKLE, RIGHT 11/13/2006    Stable post op. Had some bleeding at drain site, but drain fluid clear so suspect thsi was from the skin.   Plan: Plan for discharge  tomorrow  Subjective: Feels OK , just quite tired and feels like she needs more pain meds  Objective: Vital signs in last 24 hours: Temp:  [97.5 F (36.4 C)-98.5 F (36.9 C)] 98.3 F (36.8 C) (08/15 0500) Pulse Rate:  [74-91] 85 (08/15 0500) Resp:  [11-20] 20 (08/15 0500) BP: (113-145)/(54-93) 124/70 mmHg (08/15 0500) SpO2:  [94 %-99 %] 94 % (08/15 0500) Weight:  [177 lb 3.2 oz (80.377 kg)] 177 lb 3.2 oz (80.377 kg) (08/14 1458)   Intake/Output from previous day: 08/14 0701 - 08/15 0700 In: 1891.3 [P.O.:340; I.V.:1551.3] Out: 700 [Urine:410; Drains:240; Blood:50]  General appearance: alert, cooperative and no distress Resp: clear to auscultation bilaterally  Incision: Dressing dry, did not take dressing down  Lab Results:   Recent Labs  05/31/13 1604 06/02/13 2035  WBC 9.7 8.2  HGB 14.7 12.7  HCT 43.0 38.2  PLT 163 135*   BMET  Recent Labs  05/31/13 1604  NA 141  K 4.7  CL 104  CO2 28  GLUCOSE 85  BUN 17  CREATININE 0.88  CALCIUM 9.6    MEDS, Scheduled . albuterol  2.5 mg Nebulization BID  . anastrozole  1 mg Oral Daily  . clonazePAM  0.5 mg Oral QHS  . enoxaparin (LOVENOX) injection  40 mg Subcutaneous Q24H  . gabapentin  300 mg Oral QHS  . HYDROmorphone  2 mg Oral To PACU  . loratadine  10 mg Oral Daily  . metoprolol succinate  12.5 mg Oral QAC supper  . mometasone-formoterol  2 puff Inhalation BID  . oxybutynin  2.5 mg Oral QHS  . pantoprazole  40 mg Oral Daily  . simvastatin  40 mg Oral QPM  . tiotropium  18 mcg Inhalation Daily  . vancomycin  1,250 mg Intravenous Q24H    Studies/Results: Nm Sentinel Node Inj-no Rpt (breast)  06/02/2013   CLINICAL DATA: left breast cancer   Sulfur colloid was injected intradermally by the nuclear medicine  technologist for breast cancer sentinel node localization.    Mm Lt Plc Breast Loc Dev   1st Lesion  Inc Mammo Guide  06/02/2013   NEEDLE LOCALIZATION USING ULTRASOUND GUIDANCE AND SPECIMEN RADIOGRAPH   Comparison: Previous exams.  Patient presents for needle localization prior to lumpectomy.  I met with the patient and we discussed the procedure of needle localization including benefits and alternatives. We discussed the high likelihood of a successful procedure. We discussed the risks of the procedure, including infection, bleeding, tissue injury, and further surgery. Informed, written consent was given. The usual time-out protocol was performed immediately prior to the procedure.  Using ultrasound guidance, sterile technique, 2% lidocaine and a 5 cm modified Kopans needle, a mass was localized using a medial to lateral approach.  The films are marked for Dr. Jamey Ripa.  Specimen radiograph is performed, and confirms the hookwire and clip to be present in the tissue sample.  The specimen is marked for pathology.  IMPRESSION: Needle localization of the left breast.  No apparent complications.   Original Report Authenticated By: Jerene Dilling, M.D.      LOS: 1 day     Currie Paris, MD, [facility], [place] [phone]   06/03/2013 12:23 PM

## 2013-06-03 NOTE — Progress Notes (Signed)
PULMONARY  / CRITICAL CARE MEDICINE  Name: [name] MRN: [id number] DOB: 07/28/1941    ADMISSION DATE:  06/02/2013 CONSULTATION DATE:  06/02/2013  REFERRING MD :  06/02/13 PRIMARY SERVICE:  Streck  CHIEF COMPLAINT:  S/p breast lumpectomy   BRIEF PATIENT DESCRIPTION: 72 y/o F with hx of asthma, GERD, ischemic cardiomyopathy, stroke, HTN, COPD and L sided breast cancer presents s/p L breast lumpectomy on 06/02/13. PCCM was consulted for hx of COPD.   SIGNIFICANT EVENTS / STUDIES:  06/02/13 - breast lumpectomy with needle localization and axillary sentinel lymph node bx, removal of breast implant  LINES / TUBES: 8/14 JP L Lateral chest>>> 8/14 JP L Medial chest>>>  CULTURES:  ANTIBIOTICS: 8/15 vanco>>>  SUBJECTIVE: Patient sitting in chair. Complains of pain. Denies SOB but states when she gets up to walk she has SOB. Currently 97% on 3L .   VITAL SIGNS: Temp:  [97.5 F (36.4 C)-99.7 F (37.6 C)] 99.7 F (37.6 C) (08/15 1324) Pulse Rate:  [76-94] 94 (08/15 1324) Resp:  [11-20] 17 (08/15 1324) BP: (110-145)/(54-93) 110/58 mmHg (08/15 1324) SpO2:  [94 %-98 %] 97 % (08/15 1324) Weight:  [177 lb 3.2 oz (80.377 kg)] 177 lb 3.2 oz (80.377 kg) (08/14 1458)  PHYSICAL EXAMINATION: General:  WDWN female, in NAD Neuro:  Slow to respond, no gross neuro deficits HEENT:  PERRL Cardiovascular:  RRR, no m/r/g Lungs:  CTA, no wheezing, resp's even bil, on 3L   Abdomen:  BS+, NT, ND, no masses palpated Musculoskeletal:  No gross deformites  Skin:  L chest incision s/p L sided lumpectomy 8/14   Recent Labs Lab 05/31/13 1604  NA 141  K 4.7  CL 104  CO2 28  BUN 17  CREATININE 0.88  GLUCOSE 85    Recent Labs Lab 05/31/13 1604 06/02/13 2035  HGB 14.7 12.7  HCT 43.0 38.2  WBC 9.7 8.2  PLT 163 135*   CXR: 8/12 - emphysema  ASSESSMENT / PLAN: A: S/p L breast lumpectomy in setting of L breast cancer Hx of COPD R/o aspiration pna s/p lumpectomy  P:  - Maintain  sats at SpO2 88-92% - Continue O2 therapy, pt 2L at home - Continue albuterol, dulera BID (a formulary substitute for her home Advair) and spiriva qd. She should go home on Spiriva + Advair (not Dulera) - CXR to r/o infiltrates - Continue to use incentive spirometry  PCCM will sign off, please call if we can assist  Levy Pupa, MD, PhD 06/03/2013, 2:51 PM [facility] [phone] or if no answer [phone]

## 2013-06-04 MED ORDER — CLONAZEPAM 0.5 MG PO TABS: 0.2500 mg | ORAL_TABLET | Freq: Every day | ORAL | Status: DC

## 2013-06-04 MED ORDER — DOXYCYCLINE HYCLATE 100 MG PO TABS: 100.0000 mg | ORAL_TABLET | Freq: Two times a day (BID) | ORAL | Status: DC

## 2013-06-04 MED ORDER — HYDROMORPHONE HCL 2 MG PO TABS: 2.0000 mg | ORAL_TABLET | ORAL | Status: DC | PRN

## 2013-06-04 MED ORDER — DOXYCYCLINE HYCLATE 100 MG PO TABS
100.0000 mg | ORAL_TABLET | Freq: Two times a day (BID) | ORAL | Status: DC
  Administered 2013-06-04 (×2): 100 mg via ORAL
  Filled 2013-06-04 (×2): qty 1

## 2013-06-04 MED ORDER — MUPIROCIN 2 % EX OINT: 1.0000 "application " | TOPICAL_OINTMENT | Freq: Two times a day (BID) | CUTANEOUS | Status: DC

## 2013-06-04 MED ORDER — ENOXAPARIN SODIUM 40 MG/0.4ML ~~LOC~~ SOLN: 40.0000 mg | SUBCUTANEOUS | Status: DC

## 2013-06-04 MED ORDER — CHLORHEXIDINE GLUCONATE CLOTH 2 % EX PADS: 6.0000 | MEDICATED_PAD | Freq: Every day | CUTANEOUS | Status: DC

## 2013-06-04 NOTE — Progress Notes (Signed)
Called and spoke with Morrie Sheldon who says "brother" is on the way to pick mom up by 2200. Will review discharge instructions with son and pt.

## 2013-06-04 NOTE — Progress Notes (Signed)
General surgery attending note:  She is doing well and has been discharged home by Dr. Etter Sjogren. He will manage the drain in the wound.left breast skin looks healthy.  I told her to make an appointment to see Dr. Jamey Ripa in approximately 2 weeks. I told her to discuss her pathology report with him once he comes back.   Angelia Mould. Derrell Lolling, M.D., [facility] General and Minimally invasive Surgery Breast and Colorectal Surgery Office:   [phone] Pager:   [phone]

## 2013-06-04 NOTE — Progress Notes (Signed)
Pt transported to family auto via w/c. Accompanied by Sydnee Cabal and his wife Luster Landsberg. Assessment unchanged from am.

## 2013-06-04 NOTE — Discharge Summary (Signed)
Physician Discharge Summary  Patient ID: [name] MRN: [id number] DOB/AGE: 11/16/1940 72 y.o.  Admit date: 06/02/2013 Discharge date: 06/04/2013  Admission Diagnoses:Left breast cancer  Discharge Diagnoses: Same Principal Problem:   Cancer of lower-inner quadrant of left female breast Active Problems:   COPD (chronic obstructive pulmonary disease)   Discharged Condition: good  Hospital Course: On the day of admission the patient was taken to surgery and had left lumpectomy, sentinel node, capsulectomy, implant removal, reconstruction with tissue expander and Flex HD.Marland Kitchen The patient tolerated the procedures well. Postoperatively, the flap maintained excellent color and capillary refill. The patient was ambulatory and tolerating diet on the first postoperative day. Good pain control. She had DVT prophylaxis with lovenox.  Significant Diagnostic Studies: radiology: CXR: normal  Treatments: antibiotics: vancomycin, anticoagulation: LMW heparin and surgery: left lumpectomy, sentinel node, capsulectomy, removal implant, placement of tissue expander, and Flex HD.  Discharge Exam: Blood pressure 117/56, pulse 69, temperature 98.2 F (36.8 C), temperature source Oral, resp. rate 18, height 5\' 4"  (1.626 m), weight 177 lb 3.2 oz (80.377 kg), SpO2 98.00%.  Operative sites:Tissue expander appears to be in good position. Drains functioning. Drainage thin.  Disposition: 01-Home or Self Care  Discharge Orders   Future Appointments Provider Department Dept Phone   07/06/2013 3:00 PM Ronal Fear, NP [facility] [phone]   08/31/2013 11:15 AM Kathleene Hazel, MD [facility] Main Office [place]) [phone]   Future Orders Complete By Expires   Diet - low sodium heart healthy  As directed    Increase activity slowly  As directed        Medication List    STOP taking these medications       aspirin EC 81 MG tablet     Avocado Oil Oil     clopidogrel  75 MG tablet  Commonly known as:  PLAVIX     Fish Oil 1200 MG Caps     loratadine 10 MG tablet  Commonly known as:  CLARITIN     ODORLESS GARLIC 1250 MG Tabs  Generic drug:  Garlic     OVER THE COUNTER MEDICATION     oxyCODONE-acetaminophen 5-325 MG per tablet  Commonly known as:  PERCOCET/ROXICET     URINARY TRACT HEALTH 365-50 MG Caps  Generic drug:  Cranberry-Olive Leaf      TAKE these medications       albuterol 108 (90 BASE) MCG/ACT inhaler  Commonly known as:  PROVENTIL HFA;VENTOLIN HFA  Inhale 2 puffs into the lungs every 6 (six) hours as needed. For shortness of breath     alendronate 70 MG tablet  Commonly known as:  FOSAMAX  Take 1 tablet (70 mg total) by mouth every 7 (seven) days. Take with a full glass of water on an empty stomach.     anastrozole 1 MG tablet  Commonly known as:  ARIMIDEX  Take 1 tablet (1 mg total) by mouth daily.     benzonatate 200 MG capsule  Commonly known as:  TESSALON  Take 1 capsule (200 mg total) by mouth 3 (three) times daily as needed for cough.     calcium-vitamin D 500-200 MG-UNIT per tablet  Commonly known as:  OSCAL WITH D  Take 0.5 tablets by mouth 2 (two) times daily.     clonazePAM 0.5 MG tablet  Commonly known as:  KLONOPIN  Take 0.5 mg by mouth at bedtime.     docusate sodium 100 MG capsule  Commonly known as:  COLACE  Take 100 mg by mouth daily.     doxycycline 100 MG tablet  Commonly known as:  VIBRA-TABS  Take 1 tablet (100 mg total) by mouth every 12 (twelve) hours.     enoxaparin 40 MG/0.4ML injection  Commonly known as:  LOVENOX  Inject 0.4 mL (40 mg total) into the skin daily.     Fluticasone-Salmeterol 250-50 MCG/DOSE Aepb  Commonly known as:  ADVAIR DISKUS  Inhale 1 puff into the lungs 2 (two) times daily.     gabapentin 300 MG capsule  Commonly known as:  NEURONTIN  Take 1 capsule (300 mg total) by mouth Nightly.     HYDROmorphone 2 MG tablet  Commonly known as:  DILAUDID  Take 1 tablet (2  mg total) by mouth every 4 (four) hours as needed for pain.     L-Lysine 500 MG Tabs  Take 1 tablet by mouth daily.     magnesium 30 MG tablet  Take 30 mg by mouth 2 (two) times daily.     metoprolol succinate 25 MG 24 hr tablet  Commonly known as:  TOPROL-XL  Take 12.5 mg by mouth daily.     multivitamin capsule  Take 0.5 capsules by mouth 2 (two) times daily.     nitroGLYCERIN 0.4 MG SL tablet  Commonly known as:  NITROSTAT  Place 1 tablet (0.4 mg total) under the tongue every 5 (five) minutes as needed for chest pain (up to 3 doses).     oxybutynin 5 MG tablet  Commonly known as:  DITROPAN  Take 2.5 mg by mouth at bedtime.     pantoprazole 40 MG tablet  Commonly known as:  PROTONIX  Take 40 mg by mouth daily.     PROBIOTIC DAILY PO  Take 1 tablet by mouth daily.     simvastatin 40 MG tablet  Commonly known as:  ZOCOR  Take 40 mg by mouth every evening.     tiotropium 18 MCG inhalation capsule  Commonly known as:  SPIRIVA HANDIHALER  Place 1 capsule (18 mcg total) into inhaler and inhale daily.     vitamin B-12 1000 MCG tablet  Commonly known as:  CYANOCOBALAMIN  Take 1,000 mcg by mouth daily.     Vitamin D3 2000 UNITS Tabs  Take 2 tablets by mouth 2 (two) times daily.         SignedOdis Luster, Yash Cacciola M 06/04/2013, 9:04 AM

## 2013-06-07 ENCOUNTER — Telehealth (INDEPENDENT_AMBULATORY_CARE_PROVIDER_SITE_OTHER): Payer: Self-pay | Admitting: Surgery

## 2013-06-07 DIAGNOSIS — C50912 Malignant neoplasm of unspecified site of left female breast: Secondary | ICD-10-CM

## 2013-06-07 NOTE — Telephone Encounter (Signed)
Called her and reviewed her path report. She notesthat she is doing OK after surgery and has an appointment tomorrow with Dr Odis Luster.

## 2013-06-10 ENCOUNTER — Telehealth: Payer: Self-pay | Admitting: *Deleted

## 2013-06-10 ENCOUNTER — Telehealth (INDEPENDENT_AMBULATORY_CARE_PROVIDER_SITE_OTHER): Payer: Self-pay

## 2013-06-10 NOTE — Telephone Encounter (Signed)
Called and spoke with patient and confirmed follow up appt. With Dr. Darnelle Catalan 07/07/13 at 130.  POF generated to have patient schedule for genetics.

## 2013-06-10 NOTE — Telephone Encounter (Signed)
Called pt to make her aware that I have scheduled her to come see Dr. Jamey Ripa on 9/3 @ 215pm.  Pt stated that she would not be able to make that appt date due to transportation issues and driving limitation by Dr. Odis Luster.  Pt r/s appt to 9/23.

## 2013-06-13 ENCOUNTER — Telehealth: Payer: Self-pay | Admitting: *Deleted

## 2013-06-13 NOTE — Telephone Encounter (Signed)
Sent LM an email to schedule the pt for genetics...td

## 2013-06-14 ENCOUNTER — Telehealth: Payer: Self-pay | Admitting: Genetic Counselor

## 2013-06-14 ENCOUNTER — Telehealth: Payer: Self-pay | Admitting: *Deleted

## 2013-06-14 NOTE — Telephone Encounter (Signed)
Pt spoke with [name] and decided that she is going to wait until she finds out what her chemo/radiation schedule is going to be since she lives out in [address] and will schedule then.  Emailed [name] back to make her aware.

## 2013-06-14 NOTE — Telephone Encounter (Signed)
Received request from [name] to schedule a genetic appt.  Called pt to schedule this appt and she is wanting to speak with [name] first.  [name] requesting for her to contact this pt.

## 2013-06-14 NOTE — Telephone Encounter (Signed)
[name] mentioned that the patient had questions before she set up the appointment.  I called that patient and she said that she did not want the appointment unless someone could tell her if it would be paid for by her insurance.  I told her that since she has Medicare, she would not be balanced billed for the appointment.  She decided to make an appointment.  I will let [name] know.

## 2013-06-17 ENCOUNTER — Encounter: Payer: Self-pay | Admitting: *Deleted

## 2013-06-17 NOTE — Progress Notes (Signed)
error 

## 2013-06-17 NOTE — Progress Notes (Signed)
Oncotype ordered 06/17/13.  Faxed requisition to pathology and confirmed receipt.

## 2013-06-17 NOTE — Progress Notes (Signed)
Oncotype ordered 06/17/13.

## 2013-06-22 ENCOUNTER — Encounter (INDEPENDENT_AMBULATORY_CARE_PROVIDER_SITE_OTHER): Payer: Medicare Other | Admitting: Surgery

## 2013-06-27 ENCOUNTER — Encounter: Payer: Self-pay | Admitting: *Deleted

## 2013-06-27 NOTE — Progress Notes (Signed)
Received Oncotype Dx results of 6.  Emailed Dr. Darnelle Catalan results.   Put a copy in his box and took a copy to Med Rec to scan.

## 2013-06-27 NOTE — Progress Notes (Signed)
Received oncotype score of 6.  Copy to Dr. Darnelle Catalan and copy to Medical Records to scan.  Emailed Dr. Darnelle Catalan with results.

## 2013-06-30 ENCOUNTER — Ambulatory Visit: Payer: Self-pay | Admitting: Neurology

## 2013-07-04 ENCOUNTER — Telehealth: Payer: Self-pay | Admitting: Pulmonary Disease

## 2013-07-04 NOTE — Telephone Encounter (Signed)
lmomtcb x1 for pt 

## 2013-07-05 ENCOUNTER — Telehealth: Payer: Self-pay | Admitting: Nurse Practitioner

## 2013-07-05 ENCOUNTER — Ambulatory Visit: Payer: Self-pay | Admitting: Neurology

## 2013-07-05 ENCOUNTER — Encounter: Payer: Self-pay | Admitting: Pulmonary Disease

## 2013-07-05 MED ORDER — TIOTROPIUM BROMIDE MONOHYDRATE 18 MCG IN CAPS: 18.0000 ug | ORAL_CAPSULE | Freq: Every day | RESPIRATORY_TRACT | Status: DC

## 2013-07-05 MED ORDER — FLUTICASONE-SALMETEROL 250-50 MCG/DOSE IN AEPB: 1.0000 | INHALATION_SPRAY | Freq: Two times a day (BID) | RESPIRATORY_TRACT | Status: DC

## 2013-07-05 MED ORDER — ALBUTEROL SULFATE HFA 108 (90 BASE) MCG/ACT IN AERS: 2.0000 | INHALATION_SPRAY | Freq: Four times a day (QID) | RESPIRATORY_TRACT | Status: DC | PRN

## 2013-07-05 NOTE — Telephone Encounter (Signed)
Ok to both 

## 2013-07-05 NOTE — Telephone Encounter (Signed)
LM for pt that samples of Advair and Proair were left at front desk.  Also Dr Vassie Loll will be in office on Thursday to sign Handicap form .  Does pt want to pick up or have it mailed?

## 2013-07-05 NOTE — Telephone Encounter (Signed)
Pt in lobby to pick up samples, she is also wanting spiriva.  She also would like her handicap form mailed to her.

## 2013-07-05 NOTE — Telephone Encounter (Signed)
Call pt no answer, left message. °

## 2013-07-05 NOTE — Telephone Encounter (Signed)
Sample of Spiriva given to pt.  Form left for [name] to sign and will mail to pt when complete

## 2013-07-05 NOTE — Telephone Encounter (Signed)
RA, are you okay with patient having a sample if we have in office as well as filling out handicap placard for patient? Thanks.

## 2013-07-06 ENCOUNTER — Encounter: Payer: Self-pay | Admitting: Nurse Practitioner

## 2013-07-06 ENCOUNTER — Ambulatory Visit (INDEPENDENT_AMBULATORY_CARE_PROVIDER_SITE_OTHER): Payer: Medicare Other | Admitting: Nurse Practitioner

## 2013-07-06 VITALS — BP 102/55 | HR 94 | Temp 98.0°F | Ht 64.25 in | Wt 167.0 lb

## 2013-07-06 DIAGNOSIS — I639 Cerebral infarction, unspecified: Secondary | ICD-10-CM

## 2013-07-06 DIAGNOSIS — R42 Dizziness and giddiness: Secondary | ICD-10-CM

## 2013-07-06 DIAGNOSIS — F514 Sleep terrors [night terrors]: Secondary | ICD-10-CM

## 2013-07-06 DIAGNOSIS — I6350 Cerebral infarction due to unspecified occlusion or stenosis of unspecified cerebral artery: Secondary | ICD-10-CM

## 2013-07-06 DIAGNOSIS — G478 Other sleep disorders: Secondary | ICD-10-CM
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
## 2013-07-06 NOTE — Patient Instructions (Signed)
Continue clopidogrel 75 mg orally every day  for secondary stroke prevention and maintain strict control of hypertension with blood pressure goal below 130/90, diabetes with hemoglobin A1c goal below 6.5% and lipids with LDL cholesterol goal below 100 mg/dL.   Your blood pressure is a little low today.  Hold your dose of Metoprolol tonight and drink plenty of water or other fluids.  Followup in the future in 3 months.

## 2013-07-06 NOTE — Telephone Encounter (Signed)
Will mail out to pt

## 2013-07-07 ENCOUNTER — Ambulatory Visit (HOSPITAL_BASED_OUTPATIENT_CLINIC_OR_DEPARTMENT_OTHER): Payer: Medicare Other | Admitting: Oncology

## 2013-07-07 ENCOUNTER — Telehealth: Payer: Self-pay | Admitting: *Deleted

## 2013-07-07 VITALS — BP 114/70 | HR 91 | Temp 97.8°F | Resp 17 | Ht 64.0 in | Wt 166.6 lb

## 2013-07-07 DIAGNOSIS — C50312 Malignant neoplasm of lower-inner quadrant of left female breast: Secondary | ICD-10-CM

## 2013-07-07 DIAGNOSIS — C50319 Malignant neoplasm of lower-inner quadrant of unspecified female breast: Secondary | ICD-10-CM

## 2013-07-07 DIAGNOSIS — D0590 Unspecified type of carcinoma in situ of unspecified breast: Secondary | ICD-10-CM

## 2013-07-07 DIAGNOSIS — Z170 Estrogen receptor positive status [ER+]: Secondary | ICD-10-CM

## 2013-07-07 NOTE — Progress Notes (Signed)
ID: [name] OB: 1941-09-19  MR#: [id number]  WUJ#:[id number]  PCP: Jamie Melnick, MD GYN:  [name] SU: [name] OTHER MD: [name], [name] [name], [name]   HISTORY OF PRESENT ILLNESS: [name] had routine screening mammography 03/22/2013 showing a possible mass in the left breast. Left diagnostic mammography 04/07/2013 confirmed an irregular mass in the lower inner quadrant of the left breast, associated with her sub-glandular implant. Biopsy of this mass 04/08/2013 showed ([id number]) an invasive ductal carcinoma, grade 1, estrogen receptor 100% positive, progesterone receptor 86% positive, with an MIB-1 of 7% and no HER-2 amplification.  Bilateral breast MRIs 04/17/2013 showed no abnormal lymphadenopathy. The mass in question measured 1.7 cm by MRI.  Her subsequent history is as detailed below  INTERVAL HISTORY: [name] for followup of her breast cancer. Since her last visit here she had her definitive surgery, which showed no lymph node involvement. She also had a very favorable Oncotype report.   REVIEW OF SYSTEMS: Unfortunately the postoperative course has been complex. The first draining came out after about 10 days but the second drain is still in place and is still productive of about 60 cc a day. Sometime ago of the fluid and changed from sero-sanguinous 2 purulent and she was started on doxycycline. It is now back to see her sanguinous. However she has quite a bit of pain associated with this and she is taking oxycodone at bedtime to help her sleep. She is moderately fatigued, although she is cooking most nights. She's not doing any heavy lifting vacuuming or similar activities because any of them caused more drainage to occur. She has felt dizzy and actually fell a couple of times her blood pressure medications have been decreased she tells me. She has some ankle swelling. She feels short of breath when walking up stairs which of course is a  chronic problem with her. She has stress urinary incontinence, again not a new issue. She is having more pain in the right shoulder. She status post left shoulder surgery remotely. Otherwise she is doing well and a detailed review of systems was noncontributory except as noted.  PAST MEDICAL HISTORY: Past Medical History  Diagnosis Date  . Other emphysema   . Unspecified arthropathy, shoulder region   . Gastric ulcer, unspecified as acute or chronic, without mention of hemorrhage, perforation, or obstruction   . Benign paroxysmal positional vertigo   . COPD (chronic obstructive pulmonary disease)   . Diverticula of colon   . Osteopenia   . Personal history of colonic polyps 2007    tubular adenoma high grade dysplasia  . Diverticulitis   . Hyperlipemia   . Fracture of ankle, bimalleolar, right, closed   . Hypoxemia   . C. difficile diarrhea   . Varicose vein   . Asthma   . GERD (gastroesophageal reflux disease)   . Hepatitis 1989  . Arthritis     "both shoulders"   . TIA (transient ischemic attack) 04/2008; 08/21/2012    "slight droop left lower lip after 08/21/12 TIA"  . On home oxygen therapy   . Ischemic cardiomyopathy     a. EF 45-50% 2011. b. EF 35-40% by cath 09/01/12.  . Night terrors, adult     Jamie [name] referred  for PSG in 2011, and again in 2013   . Breast cancer 04/08/13 bx    left  . Allergy   . CAD (coronary artery disease)     a. ant  MI 1990 tx with TPA/PTCA. b. Prior stenting procedures including RCA stent, DES to LCx 04/2011. c. [place] -> DES  to prox LAD 08/2012.   Marland Kitchen Hypertension     TAKES FOR H/O STROKE  . CVA (cerebral vascular accident)   . Stroke, acute, embolic     Nov 2013 , [facility] , was treated with TPA.     PAST SURGICAL HISTORY: Past Surgical History  Procedure Laterality Date  . Total shoulder arthroplasty  2012    left  . Orif ankle fracture  2007    right  . Martial megeti  1610'R    "bladder operation; not successful " (08/31/2012)   . Varicose vein surgery  1983; 1985    "both legs both times" (08/31/2012)  . Total shoulder replacement  12/05/2010    [name]  . Abdominal hysterectomy      TAH w/BSO  . Augmentation mammaplasty  1975  . Coronary angioplasty with stent placement  2010  . Coronary angioplasty  1990    "3 times" (08/31/2012)  . Colon surgery  07/07/2011    Jamie. [name]  . Appendectomy  1960  . Nose surgery      1977  . Breast augmenttation    . Catarct  right      RIGHT EYE  . Breast lumpectomy with needle localization and axillary sentinel lymph node bx Left 06/02/2013    Procedure: BREAST LUMPECTOMY WITH NEEDLE LOCALIZATION AND AXILLARY SENTINEL LYMPH NODE BX;  Surgeon: Jamie Paris, MD;  Location: [facility] OR;  Service: General;  Laterality: Left;  NEEDLE LOC BCG 7:30 NUC MED 9:30 RECONSTRUCTION TO FOLLOW 1 HOUR added   . Breast implant removal Left 06/02/2013    Procedure: REMOVAL BREAST IMPLANTS; LEFT BREAST CAPSULECTOMY WITH PLACEMENT OF TISSUE EXPANDER AND USE OF FLEX HD;  Surgeon: Jamie Sjogren, MD;  Location: [facility] OR;  Service: Plastics;  Laterality: Left;    FAMILY HISTORY Family History  Problem Relation Age of Onset  . Breast cancer Sister     x 2  . Colon cancer Neg Hx   . Diabetes Paternal Grandmother   . Uterine cancer Paternal Grandmother   . Lung cancer Paternal Grandmother   . Heart disease Mother   . Heart disease Father   . Pancreatic cancer Cousin   . Colon polyps Sister   . Diabetes Maternal Uncle   . Heart disease Maternal Grandfather   . Heart disease Paternal Grandfather   . Stroke Paternal Grandfather    the patient's father died at age 46 from heart disease. The patient's mother died at 26 from heart disease and COPD. The patient had no brothers, 3 sisters. Sister and has no history of cancer. Sister [name] was diagnosed with breast cancer at the age of 11. She is doing well. Sister [name] was diagnosed with breast cancer at the age of 12. She has had a more rocky  course, but survives. There is no history of ovarian cancer in the family  GYNECOLOGIC HISTORY:  Menarche age 25, first live birth age 76, the patient is GX P3 she had a total abdominal hysterectomy with bilateral salpingo-oophorectomy at the age of 75. She took hormone replacement approximately 5 years. She also took birth control remotely, with no complications.  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 She is divorced and lives by herself, with no pets.    ADVANCED DIRECTIVES: In place   HEALTH MAINTENANCE: History  Substance Use Topics  . Smoking status: Former Smoker -- 1.00 packs/day for 40 years    Types: Cigarettes    Quit date: 10/20/1997  . Smokeless tobacco: Never Used  . Alcohol Use: 1.8 oz/week    3 Glasses of wine per week     Comment: occasionally     Colonoscopy: J N [name]  PAP:  Bone density:  Lipid panel:  Allergies  Allergen Reactions  . Escitalopram Oxalate Shortness Of Breath, Diarrhea and Nausea And Vomiting    Hot flashes, dizziness  . Indomethacin Other (See Comments)    "made me very very dizzy and made me have vertigo" (08/31/2012)  . Codeine Nausea And Vomiting    "I can take codeine in my cough medicine" (08/31/2012)  . Cephalexin Other (See Comments)    Complicated by C dif colitis  . Sulfonamide Derivatives Other (See Comments)    Drug induced hepatitis in 1989    Current Outpatient Prescriptions  Medication Sig Dispense Refill  . albuterol (PROVENTIL HFA;VENTOLIN HFA) 108 (90 BASE) MCG/ACT inhaler Inhale 2 puffs into the lungs every 6 (six) hours as needed. For shortness of breath  1 Inhaler  0  . alendronate (FOSAMAX) 70 MG tablet Take 1 tablet (70 mg total) by mouth every 7 (seven) days. Take with a full glass of water on an empty stomach.  12 tablet  1  . anastrozole (ARIMIDEX) 1 MG tablet Take 1 tablet (1 mg total) by mouth daily.  90 tablet  3  . benzonatate (TESSALON) 200 MG capsule Take 1 capsule  (200 mg total) by mouth 3 (three) times daily as needed for cough.  270 capsule  0  . calcium-vitamin D (OSCAL WITH D) 500-200 MG-UNIT per tablet Take 0.5 tablets by mouth 2 (two) times daily.       . Cholecalciferol (VITAMIN D3) 2000 UNITS TABS Take 2 tablets by mouth 2 (two) times daily.       . clonazePAM (KLONOPIN) 0.5 MG tablet Take 0.5 mg by mouth at bedtime.      . clopidogrel (PLAVIX) 75 MG tablet Take 75 mg by mouth daily.      Marland Kitchen docusate sodium (COLACE) 100 MG capsule Take 100 mg by mouth daily.      Marland Kitchen doxycycline (VIBRA-TABS) 100 MG tablet Take 1 tablet (100 mg total) by mouth every 12 (twelve) hours.  20 tablet  0  . enoxaparin (LOVENOX) 40 MG/0.4ML injection Inject 0.4 mL (40 mg total) into the skin daily.  5 Syringe  0  . Fluticasone-Salmeterol (ADVAIR DISKUS) 250-50 MCG/DOSE AEPB Inhale 1 puff into the lungs 2 (two) times daily.  1 each  0  . gabapentin (NEURONTIN) 300 MG capsule Take 1 capsule (300 mg total) by mouth Nightly.  90 capsule  3  . HYDROmorphone (DILAUDID) 2 MG tablet Take 1 tablet (2 mg total) by mouth every 4 (four) hours as needed for pain.  30 tablet  0  . L-Lysine 500 MG TABS Take 1 tablet by mouth daily.       . magnesium 30 MG tablet Take 30 mg by mouth 2 (two) times daily.      . metoprolol succinate (TOPROL-XL) 25 MG 24 hr tablet Take 12.5 mg by mouth daily.      . Multiple Vitamin (MULTIVITAMIN) capsule Take 0.5 capsules by mouth 2 (two) times daily.       . nitroGLYCERIN (NITROSTAT) 0.4 MG SL tablet Place 1 tablet (0.4 mg total) under the tongue every 5 (  five) minutes as needed for chest pain (up to 3 doses).  25 tablet  4  . oxybutynin (DITROPAN) 5 MG tablet Take 2.5 mg by mouth at bedtime.      . pantoprazole (PROTONIX) 40 MG tablet Take 40 mg by mouth daily.      . Probiotic Product (PROBIOTIC DAILY PO) Take 1 tablet by mouth daily.       . simvastatin (ZOCOR) 40 MG tablet Take 40 mg by mouth every evening.      . tiotropium (SPIRIVA HANDIHALER) 18 MCG  inhalation capsule Place 1 capsule (18 mcg total) into inhaler and inhale daily.  10 capsule  0  . vitamin B-12 (CYANOCOBALAMIN) 1000 MCG tablet Take 1,000 mcg by mouth daily.         No current facility-administered medications for this visit.    OBJECTIVE: Middle-aged white woman with a postoperative drain still in place in the left chest wall Filed Vitals:   07/07/13 1350  BP: 114/70  Pulse: 91  Temp: 97.8 F (36.6 C)  Resp: 17     Body mass index is 28.58 kg/(m^2).    ECOG FS: 2  Sclerae unicteric, pupils equal round and reactive, dentition is fair,Oropharynx clear No cervical or supraclavicular adenopathy Lungs no rales or rhonchi, fair excursion bilaterally, Heart regular rate and rhythm, no murmur appreciated Abd soft, nontender, positive bowel sounds MSK no focal spinal tenderness, no peripheral edema Neuro: non-focal, well-oriented, positive affect Breasts: The right breast is unremarkable. The left breast is status post lumpectomy. The lumpectomy and axillary sentinel lymph node sampling scars are healing very nicely. There is some induration, minimal erythema. The drain exhibits inferiorly below the breast. There is approximately 30 cc of serosanguineous fluid in the bulb  LAB RESULTS:  CMP     Component Value Date/Time   NA 141 05/31/2013 1604   NA 143 04/20/2013 1221   K 4.7 05/31/2013 1604   K 3.7 04/20/2013 1221   CL 104 05/31/2013 1604   CO2 28 05/31/2013 1604   CO2 27 04/20/2013 1221   GLUCOSE 85 05/31/2013 1604   GLUCOSE 108 04/20/2013 1221   GLUCOSE 101* 08/04/2006 1026   BUN 17 05/31/2013 1604   BUN 25.6 04/20/2013 1221   CREATININE 0.88 05/31/2013 1604   CREATININE 0.9 04/20/2013 1221   CREATININE 0.85 07/23/2011 1635   CALCIUM 9.6 05/31/2013 1604   CALCIUM 9.3 04/20/2013 1221   PROT 6.4 05/31/2013 1604   PROT 6.7 04/20/2013 1221   ALBUMIN 3.6 05/31/2013 1604   ALBUMIN 3.7 04/20/2013 1221   AST 22 05/31/2013 1604   AST 23 04/20/2013 1221   ALT 21 05/31/2013 1604   ALT 20  04/20/2013 1221   ALKPHOS 114 05/31/2013 1604   ALKPHOS 111 04/20/2013 1221   BILITOT 0.4 05/31/2013 1604   BILITOT 0.54 04/20/2013 1221   GFRNONAA 64* 05/31/2013 1604   GFRAA 74* 05/31/2013 1604    I No results found for this basename: SPEP,  UPEP,   kappa and lambda light chains    Lab Results  Component Value Date   WBC 8.2 06/02/2013   NEUTROABS 6.0 04/20/2013   HGB 12.7 06/02/2013   HCT 38.2 06/02/2013   MCV 90.3 06/02/2013   PLT 135* 06/02/2013      Chemistry      Component Value Date/Time   NA 141 05/31/2013 1604   NA 143 04/20/2013 1221   K 4.7 05/31/2013 1604   K 3.7 04/20/2013 1221   CL 104  05/31/2013 1604   CO2 28 05/31/2013 1604   CO2 27 04/20/2013 1221   BUN 17 05/31/2013 1604   BUN 25.6 04/20/2013 1221   CREATININE 0.88 05/31/2013 1604   CREATININE 0.9 04/20/2013 1221   CREATININE 0.85 07/23/2011 1635      Component Value Date/Time   CALCIUM 9.6 05/31/2013 1604   CALCIUM 9.3 04/20/2013 1221   ALKPHOS 114 05/31/2013 1604   ALKPHOS 111 04/20/2013 1221   AST 22 05/31/2013 1604   AST 23 04/20/2013 1221   ALT 21 05/31/2013 1604   ALT 20 04/20/2013 1221   BILITOT 0.4 05/31/2013 1604   BILITOT 0.54 04/20/2013 1221       No results found for this basename: LABCA2    No components found with this basename: LABCA125    No results found for this basename: INR,  in the last 168 hours  Urinalysis    Component Value Date/Time   COLORURINE YELLOW 03/21/2012 2006   APPEARANCEUR HAZY* 03/21/2012 2006   LABSPEC 1.017 03/21/2012 2006   PHURINE 6.5 03/21/2012 2006   GLUCOSEU NEGATIVE 03/21/2012 2006   HGBUR NEGATIVE 03/21/2012 2006   BILIRUBINUR NEGATIVE 03/21/2012 2006   BILIRUBINUR Neg 02/05/2012 1602   KETONESUR NEGATIVE 03/21/2012 2006   PROTEINUR NEGATIVE 03/21/2012 2006   UROBILINOGEN 1.0 03/21/2012 2006   UROBILINOGEN 0.2 02/05/2012 1602   NITRITE POSITIVE* 03/21/2012 2006   NITRITE Neg 02/05/2012 1602   LEUKOCYTESUR SMALL* 03/21/2012 2006    STUDIES: No results found.   ASSESSMENT: 72 y.o. [place]  woman status post left breast biopsy 04/08/2013 for a clinical T1c N0, stage IA invasive ductal carcinoma, grade 1, estrogen and progesterone receptor positive, with no HER-2 amplification, and an MIB-1 of 7%  (1) status post left lumpectomy and sentinel lymph node sampling age 60 2014 for a pT1c pN0, stage IA invasive ductal carcinoma, grade 1, with repeat HER-2 again negative  (2) Oncotype score of 6 predicts a distant disease recurrent rate of 5% within 10 years if the patient's only systemic treatment is tamoxifen for 5 years.  (3) genetics results pending  (4) started anastrozole August 2014  PLAN: Mariane Duval is tolerating the anastrozole without any significant side effects. The plan is going to be to continue that for 5 years.  We discussed radiation issues [name]. She understands in women who are over 39 with stage I estrogen receptor positive disease on anti-estrogens, the benefits of radiation in terms of local control are sufficiently marginal that she may be able to avoid radiation. She very much would prefer to do that and of course that would affect her reconstruction options as well. She have asked her to meet with Jamie. Michell Heinrich to get a definitive recommendation from her. At this point however it seems to me unlikely that she will choose to forego radiation and simply continue the anastrozole and add-on with her reconstruction.  I have made her a return appointment with me in mid January. She knows to call for any problems that may develop before that.  Lowella Dell, MD   07/07/2013 1:54 PM

## 2013-07-07 NOTE — Telephone Encounter (Signed)
appts made and printed. Pt will call [name] she already an established pt ...td

## 2013-07-12 ENCOUNTER — Ambulatory Visit (INDEPENDENT_AMBULATORY_CARE_PROVIDER_SITE_OTHER): Payer: Medicare Other | Admitting: Surgery

## 2013-07-12 ENCOUNTER — Encounter (INDEPENDENT_AMBULATORY_CARE_PROVIDER_SITE_OTHER): Payer: Self-pay | Admitting: Surgery

## 2013-07-12 VITALS — BP 126/74 | HR 80 | Temp 97.0°F | Resp 18 | Ht 64.0 in | Wt 164.0 lb

## 2013-07-12 DIAGNOSIS — Z09 Encounter for follow-up examination after completed treatment for conditions other than malignant neoplasm: Secondary | ICD-10-CM

## 2013-07-12 NOTE — Progress Notes (Signed)
CLYDIA NIEVES    [id number] 07/12/2013    03/24/1941   CC:   Chief Complaint  Patient presents with  . Routine Post Op    left breast     HPI:  The patient returns for post op follow-up. She underwent a left lumpectomy ans sln on 06/02/2013. Over all she feels that she is doing well. She felt better after her drain came out  PE: VITAL SIGNS: BP 126/74  Pulse 80  Temp(Src) 97 F (36.1 C)  Resp 18  Ht 5\' 4"  (1.626 m)  Wt 164 lb (74.39 kg)  BMI 28.14 kg/m2  Breast: The incision is healing nicely and there is no evidence of infection or hematoma.    DATA REVIEWED: Pathology report: Noted she already has seen  IMPRESSION: Patient doing well.   PLAN: Her next visit will be in one month.

## 2013-07-12 NOTE — Patient Instructions (Signed)
See me again in about a month

## 2013-07-13 ENCOUNTER — Ambulatory Visit
Admission: RE | Admit: 2013-07-13 | Discharge: 2013-07-13 | Disposition: A | Discharge status: Home / Self Care | Payer: Medicare Other | Source: Ambulatory Visit | Attending: Radiation Oncology | Admitting: Radiation Oncology

## 2013-07-13 DIAGNOSIS — C50919 Malignant neoplasm of unspecified site of unspecified female breast: Secondary | ICD-10-CM | POA: Insufficient documentation

## 2013-07-13 DIAGNOSIS — C50912 Malignant neoplasm of unspecified site of left female breast: Secondary | ICD-10-CM

## 2013-07-13 DIAGNOSIS — Z170 Estrogen receptor positive status [ER+]: Secondary | ICD-10-CM | POA: Insufficient documentation

## 2013-07-13 NOTE — Progress Notes (Signed)
         Patient Information    Patient Name Sex DOB SSN   [name], [name] Female [birth year] [id number]            Progress Notes signed by Lowella Petties, RN at 05/04/2013 3:33 PM    Author: Lowella Petties, RN Service: (none) Author Type: Registered Nurse   Filed: 05/04/2013 3:33 PM Note Time: 05/03/2013 8:26 AM     Related Notes: Original Note by Lowella Petties, RN filed at 05/04/2013 3:32 PM       Location of Breast Cancer:left lower inner quadrant 8 o ' clock position ,possibly involving breast implant capsule (breast b/l Augmentation 39 years ago)  Histology per Pathology Report: 04/08/13: Breast, left, needle core biopsy, mass, 8 o'clock- INVASIVE DUCTAL CARCINOMA WITH EXTRACELLULAR MUCIN.  Receptor Status: ER(+), PR (+), Her2-neu (-)  Did patient present with symptoms : was found on routine screening mammogram  Past/Anticipated interventions by surgeon, if any: 04/27/13 discussed lumpectomy with Dr. Cyndia Bent pending Cardiology/pulmonary consultations  Past/Anticipated interventions by medical oncology, if any: Chemotherapy seen in Multidisciplinary clinic 04/20/13, genetic counseling, high risk family hx; next office visit 05/20/13 with Dr.Magrinat  Lymphedema issues, if any: no  Pain issues, if any: yes in right shoulder a 5 on 1-10 pain scale, has night terrors at night takes klonopin and gabapentin for this  SAFETY ISSUES:  Prior radiation? no  Pacemaker/ICD? no  Possible current pregnancy?no  Is the patient on methotrexate? 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

## 2013-07-13 NOTE — Progress Notes (Signed)
Department of Radiation Oncology  Phone:  [phone] Fax:        [phone]   Name: [name] MRN: [id number]  DOB: 1941-03-31  Date: 07/13/2013  Follow Up Visit Note  Diagnosis: T1cN0 Invasive Ductal Carcinoma (ER+ PR+HER2-) closest margin is anterior  Interval History: [name] presents today for routine followup.  She has recovered well from her surgery. She had drains for 5 weeks. She has met with Dr. Welton Flakes and is continuing antiestrogen treatment. She has an expander in. She is leaving for [place] in [place] few weeks.   Allergies:  Allergies  Allergen Reactions  . Escitalopram Oxalate Shortness Of Breath, Diarrhea and Nausea And Vomiting    Hot flashes, dizziness  . Indomethacin Other (See Comments)    "made me very very dizzy and made me have vertigo" (08/31/2012)  . Codeine Nausea And Vomiting    "I can take codeine in my cough medicine" (08/31/2012)  . Cephalexin Other (See Comments)    Complicated by C dif colitis  . Sulfonamide Derivatives Other (See Comments)    Drug induced hepatitis in 1989    Medications:  Current Outpatient Prescriptions  Medication Sig Dispense Refill  . albuterol (PROVENTIL HFA;VENTOLIN HFA) 108 (90 BASE) MCG/ACT inhaler Inhale 2 puffs into the lungs every 6 (six) hours as needed. For shortness of breath  1 Inhaler  0  . alendronate (FOSAMAX) 70 MG tablet Take 1 tablet (70 mg total) by mouth every 7 (seven) days. Take with a full glass of water on an empty stomach.  12 tablet  1  . anastrozole (ARIMIDEX) 1 MG tablet Take 1 tablet (1 mg total) by mouth daily.  90 tablet  3  . benzonatate (TESSALON) 200 MG capsule Take 1 capsule (200 mg total) by mouth 3 (three) times daily as needed for cough.  270 capsule  0  . calcium-vitamin D (OSCAL WITH D) 500-200 MG-UNIT per tablet Take 0.5 tablets by mouth 2 (two) times daily.       . Cholecalciferol (VITAMIN D3) 2000 UNITS TABS Take 2 tablets by mouth 2 (two) times daily.       .  clonazePAM (KLONOPIN) 0.5 MG tablet Take 0.5 mg by mouth at bedtime.      . clopidogrel (PLAVIX) 75 MG tablet Take 75 mg by mouth daily.      Marland Kitchen docusate sodium (COLACE) 100 MG capsule Take 100 mg by mouth daily.      Marland Kitchen doxycycline (VIBRA-TABS) 100 MG tablet Take 1 tablet (100 mg total) by mouth every 12 (twelve) hours.  20 tablet  0  . Fluticasone-Salmeterol (ADVAIR DISKUS) 250-50 MCG/DOSE AEPB Inhale 1 puff into the lungs 2 (two) times daily.  1 each  0  . gabapentin (NEURONTIN) 300 MG capsule Take 1 capsule (300 mg total) by mouth Nightly.  90 capsule  3  . L-Lysine 500 MG TABS Take 1 tablet by mouth daily.       . magnesium 30 MG tablet Take 30 mg by mouth 2 (two) times daily.      . metoprolol succinate (TOPROL-XL) 25 MG 24 hr tablet Take 12.5 mg by mouth daily.      . Multiple Vitamin (MULTIVITAMIN) capsule Take 0.5 capsules by mouth 2 (two) times daily.       . nitroGLYCERIN (NITROSTAT) 0.4 MG SL tablet Place 1 tablet (0.4 mg total) under the tongue every 5 (five) minutes as needed for chest pain (up to 3 doses).  25 tablet  4  . oxybutynin (DITROPAN) 5 MG tablet Take 2.5 mg by mouth at bedtime.      . pantoprazole (PROTONIX) 40 MG tablet Take 40 mg by mouth daily.      . Probiotic Product (PROBIOTIC DAILY PO) Take 1 tablet by mouth daily.       . simvastatin (ZOCOR) 40 MG tablet Take 40 mg by mouth every evening.      . tiotropium (SPIRIVA HANDIHALER) 18 MCG inhalation capsule Place 1 capsule (18 mcg total) into inhaler and inhale daily.  10 capsule  0  . vitamin B-12 (CYANOCOBALAMIN) 1000 MCG tablet Take 1,000 mcg by mouth daily.         No current facility-administered medications for this encounter.    Physical Exam:  There were no vitals filed for this visit. Her incisions are well healed. No signs of infection  IMPRESSION: [name] is a 72 y.o. female T1cN0 Invasive Left Breast Cancer  PLAN:  I discussed with her the role of radiation in decreasing local failures in patients who  undergo breast conservation.  We discussed the randomized trials looking at the role of radiation in elderly patients.  Her survival is clearly not affected by what decision she makes.  Radiation may improve her local control slightly but I think she will be fine with antiestrogen alone.  Radiation comes at significant cost to her implant and her time with again no impact on her survival. She was comfortable with this decision and has discussed this with Dr. Jamey Ripa and Dr. Darnelle Catalan as well. I have not scheduled follow up with her. She has follow up scheduled with Dr. Darnelle Catalan.  I have released her from follow up with me.     Lurline Hare, MD

## 2013-07-13 NOTE — Progress Notes (Signed)
Please see the Nurse Progress Note in the MD Initial Consult Encounter for this patient. 

## 2013-07-15 NOTE — Addendum Note (Signed)
Encounter addended by: Delynn Flavin, RN on: 07/15/2013  9:15 AM<BR>     Documentation filed: Charges VN

## 2013-07-21 ENCOUNTER — Other Ambulatory Visit: Payer: Self-pay | Admitting: Neurology

## 2013-07-21 MED ORDER — CLONAZEPAM 0.5 MG PO TABS: 0.5000 mg | ORAL_TABLET | Freq: Every day | ORAL | Status: DC

## 2013-07-21 NOTE — Telephone Encounter (Signed)
Rx signed and faxed.

## 2013-08-23 ENCOUNTER — Other Ambulatory Visit: Payer: Self-pay | Admitting: Plastic Surgery

## 2013-08-25 ENCOUNTER — Other Ambulatory Visit: Payer: Self-pay

## 2013-08-26 ENCOUNTER — Telehealth: Payer: Self-pay | Admitting: *Deleted

## 2013-08-29 ENCOUNTER — Ambulatory Visit (INDEPENDENT_AMBULATORY_CARE_PROVIDER_SITE_OTHER): Payer: Self-pay | Admitting: *Deleted

## 2013-08-29 DIAGNOSIS — I6350 Cerebral infarction due to unspecified occlusion or stenosis of unspecified cerebral artery: Secondary | ICD-10-CM

## 2013-08-29 DIAGNOSIS — I639 Cerebral infarction, unspecified: Secondary | ICD-10-CM

## 2013-08-29 NOTE — Progress Notes (Signed)
Participant in the office today for her EXIT Visit for the IRIS Study. MMSE was completed successfully without any problems. Blood was drawn and shipped. Last bottle of study medications was returned to the [facility].  Participant to  Continue with follow-up visits with her PCP.   Participant will be notified of study trial result in late 2015.

## 2013-08-30 ENCOUNTER — Encounter (INDEPENDENT_AMBULATORY_CARE_PROVIDER_SITE_OTHER): Payer: Self-pay | Admitting: Surgery

## 2013-08-30 ENCOUNTER — Ambulatory Visit (INDEPENDENT_AMBULATORY_CARE_PROVIDER_SITE_OTHER): Payer: Medicare Other | Admitting: Surgery

## 2013-08-30 VITALS — BP 110/62 | HR 67 | Temp 98.0°F | Resp 18 | Ht 64.0 in | Wt 163.0 lb

## 2013-08-30 DIAGNOSIS — Z09 Encounter for follow-up examination after completed treatment for conditions other than malignant neoplasm: Secondary | ICD-10-CM

## 2013-08-30 NOTE — Progress Notes (Signed)
[name]    [id number] 08/30/2013    1941-01-06   CC:   Chief Complaint  Patient presents with  . Routine Post Op    left br     HPI:  The patient returns for post op follow-up. She underwent a left lumpectomy ans sln on 06/02/2013. 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 Etter Sjogren. She again has a drain in place but overall is feeling better.  PE: VITAL SIGNS: BP 110/62  Pulse 67  Temp(Src) 98 F (36.7 C)  Resp 18  Ht 5\' 4"  (1.626 m)  Wt 163 lb (73.936 kg)  BMI 27.97 kg/m2  Breast: The incision is healing. The JP is draining clear fluid.    DATA REVIEWED: No new data  IMPRESSION: Patient recovering from recent surgery  PLAN: Her next visit will be in about 3 months. She asked to see Dr. Carolynne Edouard for followup.

## 2013-08-31 ENCOUNTER — Ambulatory Visit (INDEPENDENT_AMBULATORY_CARE_PROVIDER_SITE_OTHER): Payer: Medicare Other | Admitting: Cardiovascular Disease

## 2013-08-31 ENCOUNTER — Encounter: Payer: Self-pay | Admitting: Cardiovascular Disease

## 2013-08-31 VITALS — BP 122/76 | HR 78 | Ht 64.0 in | Wt 162.1 lb

## 2013-08-31 DIAGNOSIS — I255 Ischemic cardiomyopathy: Secondary | ICD-10-CM

## 2013-08-31 DIAGNOSIS — I2589 Other forms of chronic ischemic heart disease: Secondary | ICD-10-CM

## 2013-08-31 DIAGNOSIS — R0609 Other forms of dyspnea: Secondary | ICD-10-CM

## 2013-08-31 DIAGNOSIS — C50919 Malignant neoplasm of unspecified site of unspecified female breast: Secondary | ICD-10-CM

## 2013-08-31 DIAGNOSIS — I10 Essential (primary) hypertension: Secondary | ICD-10-CM

## 2013-08-31 DIAGNOSIS — R0600 Dyspnea, unspecified: Secondary | ICD-10-CM

## 2013-08-31 DIAGNOSIS — I2510 Atherosclerotic heart disease of native coronary artery without angina pectoris: Secondary | ICD-10-CM

## 2013-08-31 DIAGNOSIS — E785 Hyperlipidemia, unspecified: Secondary | ICD-10-CM

## 2013-08-31 DIAGNOSIS — C50912 Malignant neoplasm of unspecified site of left female breast: Secondary | ICD-10-CM

## 2013-08-31 LAB — LIPID PANEL
Cholesterol: 137 mg/dL (ref 0–200)
LDL Cholesterol: 80 mg/dL (ref 0–99)

## 2013-08-31 LAB — HEPATIC FUNCTION PANEL
AST: 24 U/L (ref 0–37)
Alkaline Phosphatase: 100 U/L (ref 39–117)
Bilirubin, Direct: 0.1 mg/dL (ref 0.0–0.3)
Total Protein: 6.3 g/dL (ref 6.0–8.3)

## 2013-08-31 NOTE — Progress Notes (Signed)
History of Present Illness: 72 y.o. female with history of CAD, s/p post multiple prior stenting procedures, ischemic cardiomyopathy, severe COPD, prior strokes, HTN, HL, GERD, breast cancer here today for cardiac follow up. She suffered an anterior MI in 1990 treated with TPA followed by angioplasty. She then had a DES placed in the circumflex in 04/2009. She suffered a stroke in 2009 treated with TPA. She was diagnosed with pericarditis [place] the beach [place] 07[place]2012. 2 weeks later, she was treated again with TPA for a stroke in the hospital in [place], [place]. She was discharged on Plavix. LHC 08/2012 done for chest pain: Proximal LAD 90%, diagonal 30%, circumflex stent patent, mid RCA 50%, EF 35-40%. PCI: Promus DES to proximal LAD. Echo 12/13: EF 40-45% apical hypo-to akinesis. Stress myoview July 2014 with large anterior wall scar but no ischemia. Left lumpectomy August 2014 with infection of spacer and repeat surgery.   She is here today for follow up. No chest pain. She has dyspnea with minimal exertion. She is using all inhalers. Due for f/u with Dr. Vassie Loll. No syncope. No orthopnea, PND, edema.   Primary Care Physician: Dr. [name] Pulmonary: Dr. Vassie Loll  Last Lipid Profile:Lipid Panel     Component Value Date/Time   CHOL  Value: 110        ATP III CLASSIFICATION:  <200     mg/dL   Desirable  629-528  mg/dL   Borderline High  >=413    mg/dL   High        11/23/4008 0430   TRIG 77 05/22/2010 0430   HDL 43 05/22/2010 0430   CHOLHDL 2.6 05/22/2010 0430   VLDL 15 05/22/2010 0430   LDLCALC  Value: 52        Total Cholesterol/HDL:CHD Risk Coronary Heart Disease Risk Table                     Men   Women  1/2 Average Risk   3.4   3.3  Average Risk       5.0   4.4  2 X Average Risk   9.6   7.1  3 X Average Risk  23.4   11.0        Use the calculated Patient Ratio above and the CHD Risk Table to determine the patient's CHD Risk.        ATP III CLASSIFICATION (LDL):  <100     mg/dL   Optimal  272-536  mg/dL   Near or  Above                    Optimal  130-159  mg/dL   Borderline  644-034  mg/dL   High  >742     mg/dL   Very High 02/25/5637 7564     Past Medical History  Diagnosis Date  . Other emphysema   . Unspecified arthropathy, shoulder region   . Gastric ulcer, unspecified as acute or chronic, without mention of hemorrhage, perforation, or obstruction   . Benign paroxysmal positional vertigo   . COPD (chronic obstructive pulmonary disease)   . Diverticula of colon   . Osteopenia   . Personal history of colonic polyps 2007    tubular adenoma high grade dysplasia  . Diverticulitis   . Hyperlipemia   . Fracture of ankle, bimalleolar, right, closed   . Hypoxemia   . C. difficile diarrhea   . Varicose vein   . Asthma   .  GERD (gastroesophageal reflux disease)   . Hepatitis 1989  . Arthritis     "both shoulders"   . TIA (transient ischemic attack) 04/2008; 08/21/2012    "slight droop left lower lip after 08/21/12 TIA"  . On home oxygen therapy   . Ischemic cardiomyopathy     a. EF 45-50% 2011. b. EF 35-40% by cath 09/01/12.  . Night terrors, adult     dr Pearlean Brownie referred  for PSG in 2011, and again in 2013   . Breast cancer 04/08/13 bx    left  . Allergy   . CAD (coronary artery disease)     a. ant MI 109 tx with TPA/PTCA. b. Prior stenting procedures including RCA stent, DES to LCx 04/2011. c. [place] -> DES  to prox LAD 08/2012.   Marland Kitchen Hypertension     TAKES FOR H/O STROKE  . CVA (cerebral vascular accident)   . Stroke, acute, embolic     Nov 2013 , [place]  [facility] , was treated with TPA.   . 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 Path G1 IDC, 1.6 cm, neg margin, neg SLN     Past Surgical History  Procedure Laterality Date  . Total shoulder arthroplasty  2012    left  . Orif ankle fracture  2007    right  . Martial megeti  1191'Y    "bladder operation; not successful  " (08/31/2012)  . Varicose vein surgery  1983; 1985    "both legs both times" (08/31/2012)  . Total shoulder replacement  12/05/2010    Dr Rennis Chris  . Abdominal hysterectomy      TAH w/BSO  . Augmentation mammaplasty  1975  . Coronary angioplasty with stent placement  2010  . Coronary angioplasty  1990    "3 times" (08/31/2012)  . Colon surgery  07/07/2011    Dr. [name]  . Appendectomy  1960  . Nose surgery      1977  . Breast augmenttation    . Catarct  right      RIGHT EYE  . Breast lumpectomy with needle localization and axillary sentinel lymph node bx Left 06/02/2013    Procedure: BREAST LUMPECTOMY WITH NEEDLE LOCALIZATION AND AXILLARY SENTINEL LYMPH NODE BX;  Surgeon: Currie Paris, MD;  Location: [facility] OR;  Service: General;  Laterality: Left;  NEEDLE LOC BCG 7:30 NUC MED 9:30 RECONSTRUCTION TO FOLLOW 1 HOUR added   . Breast implant removal Left 06/02/2013    Procedure: REMOVAL BREAST IMPLANTS; LEFT BREAST CAPSULECTOMY WITH PLACEMENT OF TISSUE EXPANDER AND USE OF FLEX HD;  Surgeon: Etter Sjogren, MD;  Location: [facility] OR;  Service: Plastics;  Laterality: Left;    Current Outpatient Prescriptions  Medication Sig Dispense Refill  . albuterol (PROVENTIL HFA;VENTOLIN HFA) 108 (90 BASE) MCG/ACT inhaler Inhale 2 puffs into the lungs every 6 (six) hours as needed. For shortness of breath  1 Inhaler  0  . alendronate (FOSAMAX) 70 MG tablet Take 1 tablet (70 mg total) by mouth every 7 (seven) days. Take with a full glass of water on an empty stomach.  12 tablet  1  . anastrozole (ARIMIDEX) 1 MG tablet Take 1 tablet (1 mg total) by mouth daily.  90 tablet  3  . benzonatate (TESSALON) 200 MG capsule Take 1 capsule (200 mg total) by mouth 3 (three) times daily as needed for cough.  270 capsule  0  . calcium-vitamin D (OSCAL WITH D) 500-200 MG-UNIT per tablet Take 0.5 tablets by mouth 2 (two) times daily.       . Cholecalciferol (VITAMIN D3) 2000 UNITS TABS Take 2 tablets by mouth 2 (two) times  daily.       . clonazePAM (KLONOPIN) 0.5 MG tablet Take 1 tablet (0.5 mg total) by mouth at bedtime.  90 tablet  1  . clopidogrel (PLAVIX) 75 MG tablet Take 75 mg by mouth daily.      Marland Kitchen docusate sodium (COLACE) 100 MG capsule Take 100 mg by mouth daily.      Marland Kitchen doxycycline (VIBRA-TABS) 100 MG tablet Take 1 tablet (100 mg total) by mouth every 12 (twelve) hours.  20 tablet  0  . Fluticasone-Salmeterol (ADVAIR DISKUS) 250-50 MCG/DOSE AEPB Inhale 1 puff into the lungs 2 (two) times daily.  1 each  0  . gabapentin (NEURONTIN) 300 MG capsule Take 1 capsule (300 mg total) by mouth Nightly.  90 capsule  3  . L-Lysine 500 MG TABS Take 1 tablet by mouth daily.       . magnesium 30 MG tablet Take 30 mg by mouth 2 (two) times daily.      . metoprolol succinate (TOPROL-XL) 25 MG 24 hr tablet Take 12.5 mg by mouth daily.      . Multiple Vitamin (MULTIVITAMIN) capsule Take 0.5 capsules by mouth 2 (two) times daily.       . nitroGLYCERIN (NITROSTAT) 0.4 MG SL tablet Place 1 tablet (0.4 mg total) under the tongue every 5 (five) minutes as needed for chest pain (up to 3 doses).  25 tablet  4  . oxybutynin (DITROPAN) 5 MG tablet Take 2.5 mg by mouth at bedtime.      . pantoprazole (PROTONIX) 40 MG tablet Take 40 mg by mouth daily.      . Probiotic Product (PROBIOTIC DAILY PO) Take 1 tablet by mouth daily.       . simvastatin (ZOCOR) 40 MG tablet Take 40 mg by mouth every evening.      . tiotropium (SPIRIVA HANDIHALER) 18 MCG inhalation capsule Place 1 capsule (18 mcg total) into inhaler and inhale daily.  10 capsule  0  . vitamin B-12 (CYANOCOBALAMIN) 1000 MCG tablet Take 1,000 mcg by mouth daily.         No current facility-administered medications for this visit.    Allergies  Allergen Reactions  . Escitalopram Oxalate Shortness Of Breath, Diarrhea and Nausea And Vomiting    Hot flashes, dizziness  . Indomethacin Other (See Comments)    "made me very very dizzy and made me have vertigo" (08/31/2012)  .  Codeine Nausea And Vomiting    "I can take codeine in my cough medicine" (08/31/2012)  . Cephalexin Other (See Comments)    Complicated by C dif colitis  . Sulfonamide Derivatives Other (See Comments)    Drug induced hepatitis in 1989    History   Social History  . Marital Status: Single    Spouse Name: N/A    Number of Children: 3  . Years of Education: N/A   Occupational History  . 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    Marland Kitchen Retired    Social History Main Topics  . Smoking status: Former Smoker -- 1.00 packs/day for 40 years    Types: Cigarettes    Quit date: 10/20/1997  . Smokeless tobacco: Never Used  . Alcohol Use: 1.8 oz/week    3  Glasses of wine per week     Comment: occasionally  . Drug Use: No  . Sexual Activity: Not Currently   Other Topics Concern  . Not on file   Social History Narrative   Patient is single and lives at home alone.   Patient is retired.   Patient has some college .   Patient has 3 children.     Family History  Problem Relation Age of Onset  . Breast cancer Sister     x 2  . Colon cancer Neg Hx   . Diabetes Paternal Grandmother   . Uterine cancer Paternal Grandmother   . Lung cancer Paternal Grandmother   . Heart disease Mother   . Alcohol abuse Mother   . Heart disease Father   . Pancreatic cancer Cousin   . Colon polyps Sister   . Diabetes Maternal Uncle   . Heart disease Maternal Grandfather   . Heart disease Paternal Grandfather   . Stroke Paternal Grandfather     Review of Systems:  As stated in the HPI and otherwise negative.   BP 122/76  Pulse 78  Ht 5\' 4"  (1.626 m)  Wt 162 lb 1.9 oz (73.537 kg)  BMI 27.81 kg/m2  Physical Examination: General: Well developed, well nourished, NAD HEENT: OP clear, mucus membranes moist SKIN: warm, dry. No rashes. Neuro: No focal deficits Musculoskeletal: Muscle strength 5/5 all ext Psychiatric: Mood and affect normal Neck: No JVD, no carotid bruits, no thyromegaly, no  lymphadenopathy. Lungs:Clear bilaterally, no wheezes, rhonci, crackles Cardiovascular: Regular rate and rhythm. No murmurs, gallops or rubs. Abdomen:Soft. Bowel sounds present. Non-tender.  Extremities: No lower extremity edema. Pulses are 2 + in the bilateral DP/PT.  Stress myoview 05/09/13: Stress Procedure: The patient received IV Lexiscan 0.4 mg over 15-seconds with concurrent low level exercise and then Technetium 54m Sestamibi was injected at 30-seconds while the patient continued walking one more minute. This patient got sob and chest tightness with the Lexiscan injection. Quantitative spect images were obtained after a 45-minute delay.  Stress ECG: No significant ST segment change suggestive of ischemia.  QPS  Raw Data Images: Acquisition technically good; normal left ventricular size.  Stress Images: There is decreased uptake in the anterior wall, septum and apex.  Rest Images: There is decreased uptake in the anterior wall, septum and apex.  Subtraction (SDS): There is a fixed defect that is most consistent with a previous infarction.  Transient Ischemic Dilatation (Normal <1.22): n/a  Lung/Heart Ratio (Normal <0.45): 0.26  Quantitative Gated Spect Images  QGS EDV: 94 ml  QGS ESV: 39 ml  Impression  Exercise Capacity: Lexiscan with no exercise.  BP Response: Normal blood pressure response.  Clinical Symptoms: There is chest tightness and dyspnea.  ECG Impression: No significant ST segment change suggestive of ischemia.  Comparison with Prior Nuclear Study: No images to compare  Overall Impression: Intermediate risk stress nuclear study with large, severe, fixed defect in the anterior wall, septum and apex consistent with prior infarct; no ischemia..  LV Ejection Fraction: 59%. LV Wall Motion: Akinesis of the anterior wall and apex; calculated EF appears to overestimate LV function; suggest echocardiogram to better assess.   Assessment and Plan:   1. CAD: Known CAD.  Recent  Lexiscan stress myoview July 2014 with large anterior wall scar, no ischemia. Continue ASA, Plavix, statin.   2. Ischemic Cardiomyopathy: Volume stable. She has been hypotensive. Will d/c Toprol 12. 5 mg daily. Will not add ACE inhibitor with hypotension. I  do not think she will tolerate medical therapy.   3.  Hypertension: Controlled. No changes.   4.  Hyperlipidemia: Continue statin. Check lipids and LFTs today.  5. COPD: She is followed by Dr. Vassie Loll. Needs to plan f/u.   6. Breast CA: Plan per surgery and oncology. s/p lumpectomy. No chemo or XRT.

## 2013-08-31 NOTE — Patient Instructions (Addendum)
Your physician recommends that you schedule a follow-up appointment in:  3 months with Dr. Clifton James.  Please call our office to schedule this appointment.   Your physician has recommended you make the following change in your medication:  Stop Toprol.     You have a history of a drug eluting stent--Circumflex--2010 and Drug eluting stent--LAD--2013

## 2013-09-05 ENCOUNTER — Encounter (HOSPITAL_COMMUNITY): Payer: Self-pay | Admitting: Emergency Medicine

## 2013-09-05 ENCOUNTER — Emergency Department (HOSPITAL_COMMUNITY)
Admission: EM | Admit: 2013-09-05 | Discharge: 2013-09-05 | Disposition: A | Discharge status: Home / Self Care | Payer: Medicare Other | Attending: Emergency Medicine | Admitting: Emergency Medicine

## 2013-09-05 DIAGNOSIS — Z860100 Personal history of colon polyps, unspecified: Secondary | ICD-10-CM | POA: Insufficient documentation

## 2013-09-05 DIAGNOSIS — M7981 Nontraumatic hematoma of soft tissue: Secondary | ICD-10-CM | POA: Insufficient documentation

## 2013-09-05 DIAGNOSIS — I2510 Atherosclerotic heart disease of native coronary artery without angina pectoris: Secondary | ICD-10-CM | POA: Insufficient documentation

## 2013-09-05 DIAGNOSIS — F411 Generalized anxiety disorder: Secondary | ICD-10-CM | POA: Insufficient documentation

## 2013-09-05 DIAGNOSIS — E785 Hyperlipidemia, unspecified: Secondary | ICD-10-CM | POA: Insufficient documentation

## 2013-09-05 DIAGNOSIS — Z8673 Personal history of transient ischemic attack (TIA), and cerebral infarction without residual deficits: Secondary | ICD-10-CM | POA: Insufficient documentation

## 2013-09-05 DIAGNOSIS — J441 Chronic obstructive pulmonary disease with (acute) exacerbation: Secondary | ICD-10-CM | POA: Insufficient documentation

## 2013-09-05 DIAGNOSIS — M19019 Primary osteoarthritis, unspecified shoulder: Secondary | ICD-10-CM | POA: Insufficient documentation

## 2013-09-05 DIAGNOSIS — Z7982 Long term (current) use of aspirin: Secondary | ICD-10-CM | POA: Insufficient documentation

## 2013-09-05 DIAGNOSIS — Z79899 Other long term (current) drug therapy: Secondary | ICD-10-CM | POA: Insufficient documentation

## 2013-09-05 DIAGNOSIS — K219 Gastro-esophageal reflux disease without esophagitis: Secondary | ICD-10-CM | POA: Insufficient documentation

## 2013-09-05 DIAGNOSIS — Z8669 Personal history of other diseases of the nervous system and sense organs: Secondary | ICD-10-CM | POA: Insufficient documentation

## 2013-09-05 DIAGNOSIS — T148XXA Other injury of unspecified body region, initial encounter: Secondary | ICD-10-CM

## 2013-09-05 DIAGNOSIS — Z87891 Personal history of nicotine dependence: Secondary | ICD-10-CM | POA: Insufficient documentation

## 2013-09-05 DIAGNOSIS — Z8619 Personal history of other infectious and parasitic diseases: Secondary | ICD-10-CM | POA: Insufficient documentation

## 2013-09-05 DIAGNOSIS — I10 Essential (primary) hypertension: Secondary | ICD-10-CM | POA: Insufficient documentation

## 2013-09-05 DIAGNOSIS — N390 Urinary tract infection, site not specified: Secondary | ICD-10-CM | POA: Insufficient documentation

## 2013-09-05 DIAGNOSIS — Z9861 Coronary angioplasty status: Secondary | ICD-10-CM | POA: Insufficient documentation

## 2013-09-05 DIAGNOSIS — Z8781 Personal history of (healed) traumatic fracture: Secondary | ICD-10-CM | POA: Insufficient documentation

## 2013-09-05 DIAGNOSIS — Z853 Personal history of malignant neoplasm of breast: Secondary | ICD-10-CM | POA: Insufficient documentation

## 2013-09-05 DIAGNOSIS — J438 Other emphysema: Secondary | ICD-10-CM | POA: Insufficient documentation

## 2013-09-05 LAB — URINALYSIS, ROUTINE W REFLEX MICROSCOPIC
Hgb urine dipstick: NEGATIVE
Specific Gravity, Urine: 1.036 — ABNORMAL HIGH (ref 1.005–1.030)
Urobilinogen, UA: 1 mg/dL (ref 0.0–1.0)
pH: 6.5 (ref 5.0–8.0)

## 2013-09-05 LAB — URINE MICROSCOPIC-ADD ON

## 2013-09-05 MED ORDER — CEPHALEXIN 500 MG PO CAPS
500.0000 mg | ORAL_CAPSULE | Freq: Once | ORAL | Status: AC
  Administered 2013-09-05: 500 mg via ORAL
  Filled 2013-09-05: qty 1

## 2013-09-05 MED ORDER — ACETAMINOPHEN 500 MG PO TABS
1000.0000 mg | ORAL_TABLET | Freq: Once | ORAL | Status: AC
  Administered 2013-09-05: 1000 mg via ORAL
  Filled 2013-09-05: qty 2

## 2013-09-05 MED ORDER — CEPHALEXIN 500 MG PO CAPS: 500.0000 mg | ORAL_CAPSULE | Freq: Four times a day (QID) | ORAL | Status: DC

## 2013-09-05 NOTE — ED Notes (Signed)
Called lab to inquire about UA results, they stated sample is being processed now and it should result soon.

## 2013-09-05 NOTE — ED Notes (Signed)
Pt presents to ed with c/o bruising to right buttock and vagina. Pt reports taking plavix and sts recently underwent surgery for breast cancer and has been lying in bed for 3 days. Pt sts she is worried about blood clots.

## 2013-09-05 NOTE — ED Notes (Signed)
Bed: WA01 Expected date:  Expected time:  Means of arrival:  Comments: Low grade fever

## 2013-09-05 NOTE — ED Provider Notes (Signed)
CSN: [id number]     Arrival date & time 09/05/13  1520 History   First MD Initiated Contact with Patient 09/05/13 1548     Chief Complaint  Patient presents with  . Bleeding/Bruising   (Consider location/radiation/quality/duration/timing/severity/associated sxs/prior Treatment) The history is provided by the patient.  [name] is a 72 y.o. female hx of gastric ulcer, COPD, CAD s/p stents on ASA, plavix here with R buttock hematoma. She recently has left breast cancer surgery. She was laying in bed for the last to 3 days mostly laying on her right side. She got up this morning and noticed that her right by Dr. and right vaginal area is red. Denies any fevers or chills denies any leg swelling. Denies any chest pain or shortness of breath. She is able to urinate and defecate.    Past Medical History  Diagnosis Date  . Other emphysema   . Unspecified arthropathy, shoulder region   . Gastric ulcer, unspecified as acute or chronic, without mention of hemorrhage, perforation, or obstruction   . Benign paroxysmal positional vertigo   . COPD (chronic obstructive pulmonary disease)   . Diverticula of colon   . Osteopenia   . Personal history of colonic polyps 2007    tubular adenoma high grade dysplasia  . Diverticulitis   . Hyperlipemia   . Fracture of ankle, bimalleolar, right, closed   . Hypoxemia   . C. difficile diarrhea   . Varicose vein   . Asthma   . GERD (gastroesophageal reflux disease)   . Hepatitis 1989  . Arthritis     "both shoulders"   . TIA (transient ischemic attack) 04/2008; 08/21/2012    "slight droop left lower lip after 08/21/12 TIA"  . On home oxygen therapy   . Ischemic cardiomyopathy     a. EF 45-50% 2011. b. EF 35-40% by cath 09/01/12.  . Night terrors, adult     dr Pearlean Brownie referred  for PSG in 2011, and again in 2013   . Breast cancer 04/08/13 bx    left  . Allergy   . CAD (coronary artery disease)     a. ant MI 68 tx with TPA/PTCA. b. Prior stenting  procedures including RCA stent, DES to LCx 04/2011. c. [place] -> DES  to prox LAD 08/2012.   Marland Kitchen Hypertension     TAKES FOR H/O STROKE  . CVA (cerebral vascular accident)   . Stroke, acute, embolic     Nov 2013 , [place]  [facility] , was treated with TPA.   . 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 Path G1 IDC, 1.6 cm, neg margin, neg SLN    Past Surgical History  Procedure Laterality Date  . Total shoulder arthroplasty  2012    left  . Orif ankle fracture  2007    right  . Martial megeti  4098'J    "bladder operation; not successful " (08/31/2012)  . Varicose vein surgery  1983; 1985    "both legs both times" (08/31/2012)  . Total shoulder replacement  12/05/2010    Dr Rennis Chris  . Abdominal hysterectomy      TAH w/BSO  . Augmentation mammaplasty  1975  . Coronary angioplasty with stent placement  2010  . Coronary angioplasty  1990    "3 times" (08/31/2012)  . Colon surgery  07/07/2011    Dr. [name]  .  Appendectomy  1960  . Nose surgery      1977  . Breast augmenttation    . Catarct  right      RIGHT EYE  . Breast lumpectomy with needle localization and axillary sentinel lymph node bx Left 06/02/2013    Procedure: BREAST LUMPECTOMY WITH NEEDLE LOCALIZATION AND AXILLARY SENTINEL LYMPH NODE BX;  Surgeon: Currie Paris, MD;  Location: [facility] OR;  Service: General;  Laterality: Left;  NEEDLE LOC BCG 7:30 NUC MED 9:30 RECONSTRUCTION TO FOLLOW 1 HOUR added   . Breast implant removal Left 06/02/2013    Procedure: REMOVAL BREAST IMPLANTS; LEFT BREAST CAPSULECTOMY WITH PLACEMENT OF TISSUE EXPANDER AND USE OF FLEX HD;  Surgeon: Etter Sjogren, MD;  Location: [facility] OR;  Service: Plastics;  Laterality: Left;   Family History  Problem Relation Age of Onset  . Breast cancer Sister     x 2  . Colon cancer Neg Hx   . Diabetes Paternal Grandmother   . Uterine cancer Paternal  Grandmother   . Lung cancer Paternal Grandmother   . Heart disease Mother   . Alcohol abuse Mother   . Heart disease Father   . Pancreatic cancer Cousin   . Colon polyps Sister   . Diabetes Maternal Uncle   . Heart disease Maternal Grandfather   . Heart disease Paternal Grandfather   . Stroke Paternal Grandfather    History  Substance Use Topics  . Smoking status: Former Smoker -- 1.00 packs/day for 40 years    Types: Cigarettes    Quit date: 10/20/1997  . Smokeless tobacco: Never Used  . Alcohol Use: 1.8 oz/week    3 Glasses of wine per week     Comment: occasionally   OB History   Grav Para Term Preterm Abortions TAB SAB Ect Mult Living                 Review of Systems  Skin: Positive for color change.  All other systems reviewed and are negative.    Allergies  Escitalopram oxalate; Indomethacin; Codeine; Cephalexin; and Sulfonamide derivatives  Home Medications   Current Outpatient Rx  Name  Route  Sig  Dispense  Refill  . albuterol (PROVENTIL HFA;VENTOLIN HFA) 108 (90 BASE) MCG/ACT inhaler   Inhalation   Inhale 2 puffs into the lungs every 6 (six) hours as needed. For shortness of breath   1 Inhaler   0   . anastrozole (ARIMIDEX) 1 MG tablet   Oral   Take 1 tablet (1 mg total) by mouth daily.   90 tablet   3   . aspirin EC 81 MG tablet   Oral   Take 81 mg by mouth daily.         . clonazePAM (KLONOPIN) 0.5 MG tablet   Oral   Take 1 tablet (0.5 mg total) by mouth at bedtime.   90 tablet   1     Pharmacy Fax [phone]   . clopidogrel (PLAVIX) 75 MG tablet   Oral   Take 75 mg by mouth daily.         Marland Kitchen docusate sodium (COLACE) 100 MG capsule   Oral   Take 100 mg by mouth daily.         Marland Kitchen doxycycline (VIBRA-TABS) 100 MG tablet   Oral   Take 1 tablet (100 mg total) by mouth every 12 (twelve) hours.   20 tablet   0   . Fluticasone-Salmeterol (ADVAIR DISKUS) 250-50 MCG/DOSE AEPB  Inhalation   Inhale 1 puff into the lungs 2 (two)  times daily.   1 each   0   . gabapentin (NEURONTIN) 300 MG capsule   Oral   Take 1 capsule (300 mg total) by mouth Nightly.   90 capsule   3   . L-Lysine 500 MG TABS   Oral   Take 1 tablet by mouth daily.          Marland Kitchen oxybutynin (DITROPAN) 5 MG tablet   Oral   Take 2.5 mg by mouth at bedtime.         . pantoprazole (PROTONIX) 40 MG tablet   Oral   Take 40 mg by mouth daily.         . Probiotic Product (PROBIOTIC DAILY PO)   Oral   Take 1 tablet by mouth daily.          . simvastatin (ZOCOR) 40 MG tablet   Oral   Take 40 mg by mouth every evening.         . tiotropium (SPIRIVA HANDIHALER) 18 MCG inhalation capsule   Inhalation   Place 1 capsule (18 mcg total) into inhaler and inhale daily.   10 capsule   0   . nitroGLYCERIN (NITROSTAT) 0.4 MG SL tablet   Sublingual   Place 1 tablet (0.4 mg total) under the tongue every 5 (five) minutes as needed for chest pain (up to 3 doses).   25 tablet   4    BP 137/70  Pulse 87  Temp(Src) 97.4 F (36.3 C) (Oral)  Resp 18  SpO2 94% Physical Exam  Nursing note and vitals reviewed. Constitutional: She is oriented to person, place, and time. She appears well-developed and well-nourished.  Slightly anxious   HENT:  Head: Normocephalic.  Mouth/Throat: Oropharynx is clear and moist.  Eyes: Conjunctivae are normal. Pupils are equal, round, and reactive to light.  Neck: Normal range of motion. Neck supple.  Cardiovascular: Normal rate, regular rhythm and normal heart sounds.   Pulmonary/Chest: Effort normal and breath sounds normal. No respiratory distress. She has no wheezes.  L breast surgical wound healing well with sutures in place and no evidence of infection   Abdominal: Soft. Bowel sounds are normal. She exhibits no distension. There is no tenderness. There is no rebound.  Genitourinary:  R vaginal area with hematoma that spreads to R buttock. No fluctuance or cellulitis.   Musculoskeletal: Normal range of  motion.  No calf tenderness or edema   Neurological: She is alert and oriented to person, place, and time.  Skin: Skin is warm and dry.  Psychiatric: She has a normal mood and affect. Her behavior is normal. Judgment and thought content normal.    ED Course  Procedures (including critical care time)  EMERGENCY DEPARTMENT US SOFT TISSUE INTERPRETATION "Study: Limited Ultrasound of the noted body part in comments below"  INDICATIONS: Pain Multiple views of the body part are obtained with a multi-frequency linear probe  PERFORMED BY:  Myself  IMAGES ARCHIVED?: Yes  SIDE:Right   BODY PART:Other soft tisse (comment in note)  FINDINGS: R buttock hematoma  LIMITATIONS:  Emergent Procedure  INTERPRETATION:  No abcess noted  COMMENT:  R buttock area with hematoma   EMERGENCY DEPARTMENT US DVT INTERPRETATION "Study: Limited Ultrasound of the noted body part in comments below"  INDICATIONS: Pain Multiple views of the body part are obtained with a multi-frequency linear probe  PERFORMED BY:  Myself  IMAGES ARCHIVED?: No  SIDE:Right  BODY PART:Lower extremity  FINDINGS: Other No proximal DVT  LIMITATIONS:  Emergent Procedure  INTERPRETATION: no DVT  COMMENT:  No proximal DVT     Labs Review Labs Reviewed  URINALYSIS, ROUTINE W REFLEX MICROSCOPIC - Abnormal; Notable for the following:    Color, Urine AMBER (*)    APPearance CLOUDY (*)    Specific Gravity, Urine 1.036 (*)    Bilirubin Urine SMALL (*)    Nitrite POSITIVE (*)    Leukocytes, UA MODERATE (*)    All other components within normal limits  URINE MICROSCOPIC-ADD ON - Abnormal; Notable for the following:    Squamous Epithelial / LPF FEW (*)    Bacteria, UA MANY (*)    Crystals CA OXALATE CRYSTALS (*)    All other components within normal limits  URINE CULTURE   Imaging Review No results found.  EKG Interpretation   None       MDM  No diagnosis found. [name] is a 72 y.o. female  here with R buttock and vaginal area swelling. Likely hematoma. I performed US that showed hematoma. RLE US showed no DVT.   6:02 PM Able to urinate. UA + UTI. Previous urine culture resistant to cipro and ampicillin. She has sulfa allergy. Will give keflex. She had c diff with keflex before but she had diverticulitis surgery and will like to try it.    Richardean Canal, MD 09/05/13 [phone]

## 2013-09-07 LAB — URINE CULTURE

## 2013-09-08 NOTE — ED Notes (Signed)
Post ED Visit - Positive Culture Follow-up  Culture report reviewed by antimicrobial stewardship pharmacist: []  Wes Dulaney, Pharm.D., BCPS []  Celedonio Miyamoto, Pharm.D., BCPS []  Georgina Pillion, [address].D., BCPS []  [place], [address].D., BCPS, AAHIVP [x]  Estella Husk, Pharm.D., BCPS, AAHIVP  Positive urine culture Treated with Keflex, organism sensitive to the same and no further patient follow-up is required at this time.  Kylie A Holland 09/08/2013, 11:09 AM

## 2013-09-09 ENCOUNTER — Telehealth: Payer: Self-pay | Admitting: Pulmonary Disease

## 2013-09-09 MED ORDER — TIOTROPIUM BROMIDE MONOHYDRATE 18 MCG IN CAPS: 18.0000 ug | ORAL_CAPSULE | Freq: Every day | RESPIRATORY_TRACT | Status: DC

## 2013-09-09 MED ORDER — FLUTICASONE-SALMETEROL 250-50 MCG/DOSE IN AEPB: 1.0000 | INHALATION_SPRAY | Freq: Two times a day (BID) | RESPIRATORY_TRACT | Status: DC

## 2013-09-09 NOTE — Telephone Encounter (Signed)
Pt aware samples elft upfront for pick up. Nothing further eneded

## 2013-09-09 NOTE — Telephone Encounter (Signed)
lmtcb x1 for pt   She can see TP or she can schedule appt to see RA in the [place] office. RA has nothing in [facility]

## 2013-09-09 NOTE — Telephone Encounter (Signed)
Pt is requesting an appt with RA before christmas. i have called and lmom to let the pt know that we will have to check with mindy to see where we can work her in for an appt.  Pt is aware that we will call her back to schedule this appt.  mindy please advise where we can schedule this pt.  thanks

## 2013-09-13 NOTE — Telephone Encounter (Signed)
appt set for RA on 10-03-13 in [place]. Pt aware. Carron Curie, CMA

## 2013-10-03 ENCOUNTER — Ambulatory Visit (INDEPENDENT_AMBULATORY_CARE_PROVIDER_SITE_OTHER): Payer: Medicare Other | Admitting: Pulmonary Disease

## 2013-10-03 ENCOUNTER — Other Ambulatory Visit: Payer: Self-pay | Admitting: Pulmonary Disease

## 2013-10-03 ENCOUNTER — Encounter: Payer: Self-pay | Admitting: Pulmonary Disease

## 2013-10-03 VITALS — BP 122/62 | HR 85 | Temp 97.7°F | Ht 64.5 in | Wt 167.0 lb

## 2013-10-03 DIAGNOSIS — J449 Chronic obstructive pulmonary disease, unspecified: Secondary | ICD-10-CM

## 2013-10-03 DIAGNOSIS — Z23 Encounter for immunization: Secondary | ICD-10-CM

## 2013-10-03 DIAGNOSIS — N390 Urinary tract infection, site not specified: Secondary | ICD-10-CM

## 2013-10-03 NOTE — Progress Notes (Signed)
Subjective:    Patient ID: [name], female    DOB: 09/17/1941, 72 y.o.   MRN: [id number]  HPI  PCP - [name]   72/F, ex smoker, realtor with Gold C COPD FEV1 1.0 L (48%), on nocturnal O2 since 10/10 for FU.  She smoked for 40 years and quit in 1999. Advair worked better than symbicort.  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  Cardiac cath 08/31/12 with severe stenosis proximal LAD now s/p drug eluting stent proximal LAD  She went to [facility] for a second opinion - rehab & Theophylline advised  Remains reluctant to enroll in pulm rehab     10/03/2013 Underwent surgery Left breast for breast cancer Developed post op infectious complications No cough , no wheezing, no chest tx per pt.  Pt uses oxygen at bedtime.  2-3 steroid flares per year requiring steroid bursts - last 4/14  Feels not fully recovered & worse than baseline  Planning for right shoulder surgery next year Prior post op courses reviewed as noted above  Recent Proteus UTI - given keflex - no dysuria No desatn on walking 2 laps  Past Medical History  Diagnosis Date  . Other emphysema   . Unspecified arthropathy, shoulder region   . Gastric ulcer, unspecified as acute or chronic, without mention of hemorrhage, perforation, or obstruction   . Benign paroxysmal positional vertigo   . COPD (chronic obstructive pulmonary disease)   . Diverticula of colon   . Osteopenia   . Personal history of colonic polyps 2007    tubular adenoma high grade dysplasia  . Diverticulitis   . Hyperlipemia   . Fracture of ankle, bimalleolar, right, closed   . Hypoxemia   . C. difficile diarrhea    . Varicose vein   . Asthma   . GERD (gastroesophageal reflux disease)   . Hepatitis 1989  . Arthritis     "both shoulders"   . TIA (transient ischemic attack) 04/2008; 08/21/2012    "slight droop left lower lip after 08/21/12 TIA"  . On home oxygen therapy   . Ischemic cardiomyopathy     a. EF 45-50% 2011. b. EF 35-40% by cath 09/01/12.  . Night terrors, adult     dr Pearlean Brownie referred  for PSG in 2011, and again in 2013   . Breast cancer 04/08/13 bx    left  . Allergy   . CAD (coronary artery disease)     a. ant MI 66 tx with TPA/PTCA. b. Prior stenting procedures including RCA stent, DES to LCx 04/2011. c. [place] -> DES  to prox LAD 08/2012.   Marland Kitchen Hypertension     TAKES FOR H/O STROKE  . CVA (cerebral vascular accident)   . Stroke, acute, embolic     Nov 2013 , [facility] , was treated with TPA.   . 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 Path G1 IDC, 1.6 cm, neg margin, neg SLN      Review of Systems neg for any significant sore throat, dysphagia,  itching, sneezing, nasal congestion or excess/ purulent secretions, fever, chills, sweats, unintended wt loss, pleuritic or exertional cp, hempoptysis, orthopnea pnd or change in chronic leg swelling. Also denies presyncope, palpitations, heartburn, abdominal pain, nausea, vomiting, diarrhea or change in bowel or urinary habits, dysuria,hematuria, rash, arthralgias, visual complaints, headache, numbness weakness or ataxia.     Objective:   Physical Exam  Gen. Pleasant, obese, in no distress, normal affect ENT - no lesions, no post nasal drip, class 2-3 airway Neck: No JVD, no thyromegaly, no carotid bruits Lungs: no use of accessory muscles, no dullness to percussion, decreased without rales or rhonchi  Cardiovascular: Rhythm regular, heart sounds  normal, no murmurs or gallops, no peripheral edema Abdomen: soft  and non-tender, no hepatosplenomegaly, BS normal. Musculoskeletal: No deformities, no cyanosis or clubbing Neuro:  alert, non focal, no tremors       Assessment & Plan:

## 2013-10-03 NOTE — Assessment & Plan Note (Signed)
OK to proceed with shoulder surgery when you are feeling better - you are moderate to high risk for pulmonary complications Ambulatory satn Prevnar vaccine today Urinalysis to ensure UTI clearance Stay on advair & spiriva Take albuterol neb in am if activity planned  Will require IV solucortef 100 q 8h peri-operatively x 5 doses with rapid taper to pred 20 mg x 3ds ( since she has received multiple steroid tapers in the past year) Albuterol nebs q 6h peri-op  Oxygen during sleep Early mobilisation & DVT prophylaxis as would be routine Note that she had AF-RVR durig prior surgery

## 2013-10-03 NOTE — Patient Instructions (Addendum)
OK to proceed with shoulder surgery when you are feeling better - you are mdoerate to high risk for pulmonary complications Ambulatory satn Prevnar vaccine today Urinalysis to ensure UTI clearance Stay on advair & spiriva Take albuterol neb in am if activity planned

## 2013-10-04 LAB — URINALYSIS
Bilirubin Urine: NEGATIVE
Glucose, UA: NEGATIVE mg/dL
Hgb urine dipstick: NEGATIVE
Protein, ur: NEGATIVE mg/dL
pH: 5.5 (ref 5.0–8.0)

## 2013-10-06 ENCOUNTER — Telehealth: Payer: Self-pay | Admitting: Pulmonary Disease

## 2013-10-06 NOTE — Telephone Encounter (Signed)
Called and spoke with pt about her lab results per RA.  Pt voiced her understanding and nothing further is needed.

## 2013-10-29 ENCOUNTER — Other Ambulatory Visit: Payer: Self-pay | Admitting: Pulmonary Disease

## 2013-11-02 ENCOUNTER — Telehealth: Payer: Self-pay | Admitting: Cardiovascular Disease

## 2013-11-02 NOTE — Telephone Encounter (Signed)
Pt called to see if she needs to be seen prior being clear for a right total shoulder replacement, Like she had it done with the left  shoulder two years ago. Pt is aware that pt may not need to be seen prior being clear. The  Last O/V with  Dr. Angelena Form was on 08/31/13. Pt's is scheduled for surgery on 11/17/13.

## 2013-11-02 NOTE — Telephone Encounter (Signed)
New message     Talk to someone about some upcoming surgery she has scheduled

## 2013-11-03 ENCOUNTER — Telehealth: Payer: Self-pay | Admitting: Pulmonary Disease

## 2013-11-03 MED ORDER — AZITHROMYCIN 250 MG PO TABS: 250.0000 mg | ORAL_TABLET | ORAL | Status: DC

## 2013-11-03 MED ORDER — PREDNISONE 10 MG PO TABS: ORAL_TABLET | ORAL | Status: DC

## 2013-11-03 NOTE — Telephone Encounter (Signed)
Spoke with pt who reports she has had 2 episodes of chest pain in last month. Both relieved with 2 NTG. She is out of town until Sunday evening. She cannot be here for appointment on November 09, 2013 as she has other appointments that day. Appt made for pt to see Richardson Dopp, PA on November 10, 2013 at 2:20

## 2013-11-03 NOTE — Telephone Encounter (Signed)
Pt aware of rx called into pharmacy. [name] [phone]

## 2013-11-03 NOTE — Telephone Encounter (Signed)
mucinex Prednisone 10 mg tabs  Take 2 tabs daily with food x 5ds, then 1 tab daily with food x 5ds then STOP Z-pak if yellow sputum or fever

## 2013-11-03 NOTE — Telephone Encounter (Signed)
Called spoke with patient who c/o difficulty breathing, wheezing, chest tightness onset last night.  [place].  She will not return to [place] until Sunnday 1.18.15 and requests ov next week when she returns.  No mucus production, f/c/s, nausea, vomiting  RA only in the office one day next week and has no openings; is in [place] this afternoon but not again until 2.5.15. Appt scheduled with BQ for 1.20.15 @ 3pm for acute visit for bronchitis.  In the meantime, pt would like recommendations.  She has been taking Tessalon only for symptoms. Allergies  Allergen Reactions  . Escitalopram Oxalate Shortness Of Breath, Diarrhea and Nausea And Vomiting    Hot flashes, dizziness  . Indomethacin Other (See Comments)    "made me very very dizzy and made me have vertigo" (08/31/2012)  . Codeine Nausea And Vomiting    "I can take codeine in my cough medicine" (08/31/2012)  . Sulfonamide Derivatives Other (See Comments)    Drug induced hepatitis in [place] [address] [name] please advise, thank you.

## 2013-11-03 NOTE — Telephone Encounter (Signed)
Can we check with her and see how she is feeling? If she is doing well, I can write a letter to her surgeon. Normal stress test July 2014. Last sent November 2013. chris

## 2013-11-03 NOTE — Telephone Encounter (Signed)
Agree. cdm 

## 2013-11-08 ENCOUNTER — Encounter (INDEPENDENT_AMBULATORY_CARE_PROVIDER_SITE_OTHER): Payer: Self-pay | Admitting: General Surgery

## 2013-11-08 ENCOUNTER — Encounter (HOSPITAL_COMMUNITY): Payer: Self-pay | Admitting: Emergency Medicine

## 2013-11-08 ENCOUNTER — Encounter: Payer: Self-pay | Admitting: Pulmonary Disease

## 2013-11-08 ENCOUNTER — Ambulatory Visit (INDEPENDENT_AMBULATORY_CARE_PROVIDER_SITE_OTHER): Payer: Medicare Other | Admitting: Pulmonary Disease

## 2013-11-08 ENCOUNTER — Emergency Department (HOSPITAL_COMMUNITY): Payer: Medicare Other

## 2013-11-08 ENCOUNTER — Other Ambulatory Visit: Payer: Self-pay

## 2013-11-08 ENCOUNTER — Inpatient Hospital Stay (HOSPITAL_COMMUNITY)
Admission: EM | Admit: 2013-11-08 | Discharge: 2013-11-18 | DRG: 207 | Disposition: A | Discharge status: Other Acute Inpatient Hospital | Payer: Medicare Other | Attending: Pulmonary Disease | Admitting: Pulmonary Disease

## 2013-11-08 VITALS — BP 136/84 | HR 93 | Temp 98.0°F | Ht 64.0 in | Wt 165.0 lb

## 2013-11-08 DIAGNOSIS — T380X5A Adverse effect of glucocorticoids and synthetic analogues, initial encounter: Secondary | ICD-10-CM | POA: Diagnosis present

## 2013-11-08 DIAGNOSIS — E785 Hyperlipidemia, unspecified: Secondary | ICD-10-CM

## 2013-11-08 DIAGNOSIS — K5732 Diverticulitis of large intestine without perforation or abscess without bleeding: Secondary | ICD-10-CM

## 2013-11-08 DIAGNOSIS — Z860100 Personal history of colon polyps, unspecified: Secondary | ICD-10-CM

## 2013-11-08 DIAGNOSIS — K219 Gastro-esophageal reflux disease without esophagitis: Secondary | ICD-10-CM | POA: Diagnosis present

## 2013-11-08 DIAGNOSIS — M899 Disorder of bone, unspecified: Secondary | ICD-10-CM

## 2013-11-08 DIAGNOSIS — I10 Essential (primary) hypertension: Secondary | ICD-10-CM | POA: Diagnosis present

## 2013-11-08 DIAGNOSIS — C50312 Malignant neoplasm of lower-inner quadrant of left female breast: Secondary | ICD-10-CM

## 2013-11-08 DIAGNOSIS — C50912 Malignant neoplasm of unspecified site of left female breast: Secondary | ICD-10-CM | POA: Diagnosis present

## 2013-11-08 DIAGNOSIS — K5900 Constipation, unspecified: Secondary | ICD-10-CM

## 2013-11-08 DIAGNOSIS — B379 Candidiasis, unspecified: Secondary | ICD-10-CM

## 2013-11-08 DIAGNOSIS — Z7902 Long term (current) use of antithrombotics/antiplatelets: Secondary | ICD-10-CM

## 2013-11-08 DIAGNOSIS — I472 Ventricular tachycardia, unspecified: Secondary | ICD-10-CM | POA: Diagnosis not present

## 2013-11-08 DIAGNOSIS — I959 Hypotension, unspecified: Secondary | ICD-10-CM | POA: Diagnosis not present

## 2013-11-08 DIAGNOSIS — K529 Noninfective gastroenteritis and colitis, unspecified: Secondary | ICD-10-CM

## 2013-11-08 DIAGNOSIS — Z8711 Personal history of peptic ulcer disease: Secondary | ICD-10-CM

## 2013-11-08 DIAGNOSIS — R109 Unspecified abdominal pain: Secondary | ICD-10-CM

## 2013-11-08 DIAGNOSIS — H60399 Other infective otitis externa, unspecified ear: Secondary | ICD-10-CM

## 2013-11-08 DIAGNOSIS — K259 Gastric ulcer, unspecified as acute or chronic, without hemorrhage or perforation: Secondary | ICD-10-CM

## 2013-11-08 DIAGNOSIS — K5730 Diverticulosis of large intestine without perforation or abscess without bleeding: Secondary | ICD-10-CM

## 2013-11-08 DIAGNOSIS — J441 Chronic obstructive pulmonary disease with (acute) exacerbation: Secondary | ICD-10-CM | POA: Diagnosis present

## 2013-11-08 DIAGNOSIS — L0291 Cutaneous abscess, unspecified: Secondary | ICD-10-CM

## 2013-11-08 DIAGNOSIS — K251 Acute gastric ulcer with perforation: Secondary | ICD-10-CM

## 2013-11-08 DIAGNOSIS — I5022 Chronic systolic (congestive) heart failure: Secondary | ICD-10-CM | POA: Diagnosis present

## 2013-11-08 DIAGNOSIS — I252 Old myocardial infarction: Secondary | ICD-10-CM

## 2013-11-08 DIAGNOSIS — I509 Heart failure, unspecified: Secondary | ICD-10-CM | POA: Diagnosis present

## 2013-11-08 DIAGNOSIS — R0902 Hypoxemia: Secondary | ICD-10-CM

## 2013-11-08 DIAGNOSIS — J209 Acute bronchitis, unspecified: Secondary | ICD-10-CM | POA: Diagnosis present

## 2013-11-08 DIAGNOSIS — J15211 Pneumonia due to Methicillin susceptible Staphylococcus aureus: Secondary | ICD-10-CM

## 2013-11-08 DIAGNOSIS — I2510 Atherosclerotic heart disease of native coronary artery without angina pectoris: Secondary | ICD-10-CM | POA: Diagnosis present

## 2013-11-08 DIAGNOSIS — J45901 Unspecified asthma with (acute) exacerbation: Secondary | ICD-10-CM

## 2013-11-08 DIAGNOSIS — I2589 Other forms of chronic ischemic heart disease: Secondary | ICD-10-CM

## 2013-11-08 DIAGNOSIS — J1100 Influenza due to unidentified influenza virus with unspecified type of pneumonia: Principal | ICD-10-CM | POA: Diagnosis present

## 2013-11-08 DIAGNOSIS — F514 Sleep terrors [night terrors]: Secondary | ICD-10-CM

## 2013-11-08 DIAGNOSIS — M549 Dorsalgia, unspecified: Secondary | ICD-10-CM

## 2013-11-08 DIAGNOSIS — K1120 Sialoadenitis, unspecified: Secondary | ICD-10-CM | POA: Diagnosis present

## 2013-11-08 DIAGNOSIS — J440 Chronic obstructive pulmonary disease with acute lower respiratory infection: Secondary | ICD-10-CM | POA: Diagnosis present

## 2013-11-08 DIAGNOSIS — Z8719 Personal history of other diseases of the digestive system: Secondary | ICD-10-CM

## 2013-11-08 DIAGNOSIS — M19019 Primary osteoarthritis, unspecified shoulder: Secondary | ICD-10-CM

## 2013-11-08 DIAGNOSIS — J189 Pneumonia, unspecified organism: Secondary | ICD-10-CM

## 2013-11-08 DIAGNOSIS — C50919 Malignant neoplasm of unspecified site of unspecified female breast: Secondary | ICD-10-CM

## 2013-11-08 DIAGNOSIS — J9600 Acute respiratory failure, unspecified whether with hypoxia or hypercapnia: Secondary | ICD-10-CM

## 2013-11-08 DIAGNOSIS — I4729 Other ventricular tachycardia: Secondary | ICD-10-CM

## 2013-11-08 DIAGNOSIS — Z96619 Presence of unspecified artificial shoulder joint: Secondary | ICD-10-CM

## 2013-11-08 DIAGNOSIS — Z8679 Personal history of other diseases of the circulatory system: Secondary | ICD-10-CM

## 2013-11-08 DIAGNOSIS — I6350 Cerebral infarction due to unspecified occlusion or stenosis of unspecified cerebral artery: Secondary | ICD-10-CM | POA: Diagnosis present

## 2013-11-08 DIAGNOSIS — J449 Chronic obstructive pulmonary disease, unspecified: Secondary | ICD-10-CM

## 2013-11-08 DIAGNOSIS — C189 Malignant neoplasm of colon, unspecified: Secondary | ICD-10-CM

## 2013-11-08 DIAGNOSIS — R0602 Shortness of breath: Secondary | ICD-10-CM

## 2013-11-08 DIAGNOSIS — I639 Cerebral infarction, unspecified: Secondary | ICD-10-CM

## 2013-11-08 DIAGNOSIS — D509 Iron deficiency anemia, unspecified: Secondary | ICD-10-CM

## 2013-11-08 DIAGNOSIS — J4489 Other specified chronic obstructive pulmonary disease: Secondary | ICD-10-CM

## 2013-11-08 DIAGNOSIS — Z803 Family history of malignant neoplasm of breast: Secondary | ICD-10-CM

## 2013-11-08 DIAGNOSIS — Z9861 Coronary angioplasty status: Secondary | ICD-10-CM

## 2013-11-08 DIAGNOSIS — R197 Diarrhea, unspecified: Secondary | ICD-10-CM

## 2013-11-08 DIAGNOSIS — G478 Other sleep disorders: Secondary | ICD-10-CM | POA: Diagnosis present

## 2013-11-08 DIAGNOSIS — Z9981 Dependence on supplemental oxygen: Secondary | ICD-10-CM

## 2013-11-08 DIAGNOSIS — R7309 Other abnormal glucose: Secondary | ICD-10-CM | POA: Diagnosis present

## 2013-11-08 DIAGNOSIS — Z8673 Personal history of transient ischemic attack (TIA), and cerebral infarction without residual deficits: Secondary | ICD-10-CM

## 2013-11-08 DIAGNOSIS — L089 Local infection of the skin and subcutaneous tissue, unspecified: Secondary | ICD-10-CM

## 2013-11-08 DIAGNOSIS — J101 Influenza due to other identified influenza virus with other respiratory manifestations: Secondary | ICD-10-CM

## 2013-11-08 DIAGNOSIS — Z87891 Personal history of nicotine dependence: Secondary | ICD-10-CM

## 2013-11-08 DIAGNOSIS — S82899A Other fracture of unspecified lower leg, initial encounter for closed fracture: Secondary | ICD-10-CM

## 2013-11-08 DIAGNOSIS — F411 Generalized anxiety disorder: Secondary | ICD-10-CM | POA: Diagnosis present

## 2013-11-08 DIAGNOSIS — L0390 Cellulitis, unspecified: Secondary | ICD-10-CM

## 2013-11-08 DIAGNOSIS — G9340 Encephalopathy, unspecified: Secondary | ICD-10-CM

## 2013-11-08 DIAGNOSIS — Z7982 Long term (current) use of aspirin: Secondary | ICD-10-CM

## 2013-11-08 DIAGNOSIS — I200 Unstable angina: Secondary | ICD-10-CM

## 2013-11-08 DIAGNOSIS — H8110 Benign paroxysmal vertigo, unspecified ear: Secondary | ICD-10-CM

## 2013-11-08 DIAGNOSIS — M949 Disorder of cartilage, unspecified: Secondary | ICD-10-CM

## 2013-11-08 LAB — BLOOD GAS, ARTERIAL
Acid-base deficit: 0.9 mmol/L (ref 0.0–2.0)
Bicarbonate: 25 mEq/L — ABNORMAL HIGH (ref 20.0–24.0)
Drawn by: 308601
O2 Content: 8 L/min
O2 SAT: 97.7 %
PCO2 ART: 49 mmHg — AB (ref 35.0–45.0)
PO2 ART: 116 mmHg — AB (ref 80.0–100.0)
Patient temperature: 98.6
TCO2: 22.5 mmol/L (ref 0–100)
pH, Arterial: 7.329 — ABNORMAL LOW (ref 7.350–7.450)

## 2013-11-08 LAB — PRO B NATRIURETIC PEPTIDE: Pro B Natriuretic peptide (BNP): 199.9 pg/mL — ABNORMAL HIGH (ref 0–125)

## 2013-11-08 LAB — CBC
HEMATOCRIT: 41 % (ref 36.0–46.0)
HEMOGLOBIN: 13.4 g/dL (ref 12.0–15.0)
MCH: 28.6 pg (ref 26.0–34.0)
MCHC: 32.7 g/dL (ref 30.0–36.0)
MCV: 87.4 fL (ref 78.0–100.0)
Platelets: 160 10*3/uL (ref 150–400)
RBC: 4.69 MIL/uL (ref 3.87–5.11)
RDW: 13.9 % (ref 11.5–15.5)
WBC: 9.2 10*3/uL (ref 4.0–10.5)

## 2013-11-08 LAB — BASIC METABOLIC PANEL
BUN: 22 mg/dL (ref 6–23)
CHLORIDE: 98 meq/L (ref 96–112)
CO2: 25 meq/L (ref 19–32)
CREATININE: 0.8 mg/dL (ref 0.50–1.10)
Calcium: 9.3 mg/dL (ref 8.4–10.5)
GFR calc Af Amer: 83 mL/min — ABNORMAL LOW (ref >90)
GFR calc non Af Amer: 72 mL/min — ABNORMAL LOW (ref >90)
GLUCOSE: 94 mg/dL (ref 70–99)
Potassium: 4.2 mEq/L (ref 3.7–5.3)
Sodium: 138 mEq/L (ref 137–147)

## 2013-11-08 MED ORDER — OXYBUTYNIN CHLORIDE 5 MG PO TABS
2.5000 mg | ORAL_TABLET | Freq: Every day | ORAL | Status: DC
  Administered 2013-11-10 – 2013-11-12 (×3): 2.5 mg via ORAL
  Filled 2013-11-08 (×6): qty 0.5

## 2013-11-08 MED ORDER — CLOPIDOGREL BISULFATE 75 MG PO TABS
75.0000 mg | ORAL_TABLET | Freq: Every day | ORAL | Status: DC
  Administered 2013-11-09 – 2013-11-18 (×9): 75 mg via ORAL
  Filled 2013-11-08 (×12): qty 1

## 2013-11-08 MED ORDER — SODIUM CHLORIDE 0.9 % IV BOLUS (SEPSIS)
500.0000 mL | Freq: Once | INTRAVENOUS | Status: AC
  Administered 2013-11-08: 500 mL via INTRAVENOUS

## 2013-11-08 MED ORDER — ONDANSETRON HCL 4 MG PO TABS: 4.0000 mg | ORAL_TABLET | Freq: Four times a day (QID) | ORAL | Status: DC | PRN

## 2013-11-08 MED ORDER — METOPROLOL TARTRATE 12.5 MG HALF TABLET
12.5000 mg | ORAL_TABLET | Freq: Two times a day (BID) | ORAL | Status: DC
  Administered 2013-11-09 – 2013-11-13 (×9): 12.5 mg via ORAL
  Filled 2013-11-08 (×11): qty 1

## 2013-11-08 MED ORDER — ASPIRIN EC 81 MG PO TBEC
81.0000 mg | DELAYED_RELEASE_TABLET | Freq: Every evening | ORAL | Status: DC
  Administered 2013-11-08 – 2013-11-12 (×4): 81 mg via ORAL
  Filled 2013-11-08 (×6): qty 1

## 2013-11-08 MED ORDER — ONDANSETRON HCL 4 MG/2ML IJ SOLN: 4.0000 mg | Freq: Four times a day (QID) | INTRAMUSCULAR | Status: DC | PRN

## 2013-11-08 MED ORDER — IOHEXOL 300 MG/ML  SOLN
100.0000 mL | Freq: Once | INTRAMUSCULAR | Status: AC | PRN
  Administered 2013-11-08: 100 mL via INTRAVENOUS

## 2013-11-08 MED ORDER — ACETAMINOPHEN-CODEINE #3 300-30 MG PO TABS: 1.0000 | ORAL_TABLET | Freq: Four times a day (QID) | ORAL | Status: DC | PRN

## 2013-11-08 MED ORDER — CLONAZEPAM 0.5 MG PO TABS
0.5000 mg | ORAL_TABLET | Freq: Every day | ORAL | Status: DC
  Administered 2013-11-08 – 2013-11-10 (×2): 0.5 mg via ORAL
  Filled 2013-11-08 (×3): qty 1

## 2013-11-08 MED ORDER — METHYLPREDNISOLONE SODIUM SUCC 125 MG IJ SOLR
125.0000 mg | Freq: Once | INTRAMUSCULAR | Status: AC
  Administered 2013-11-08: 125 mg via INTRAVENOUS
  Filled 2013-11-08: qty 2

## 2013-11-08 MED ORDER — ENOXAPARIN SODIUM 40 MG/0.4ML ~~LOC~~ SOLN
40.0000 mg | Freq: Every day | SUBCUTANEOUS | Status: DC
  Administered 2013-11-08 – 2013-11-17 (×10): 40 mg via SUBCUTANEOUS
  Filled 2013-11-08 (×11): qty 0.4

## 2013-11-08 MED ORDER — TIOTROPIUM BROMIDE MONOHYDRATE 18 MCG IN CAPS
18.0000 ug | ORAL_CAPSULE | Freq: Every day | RESPIRATORY_TRACT | Status: DC
  Administered 2013-11-09: 18 ug via RESPIRATORY_TRACT
  Filled 2013-11-08: qty 5

## 2013-11-08 MED ORDER — SODIUM CHLORIDE 0.9 % IJ SOLN
3.0000 mL | Freq: Two times a day (BID) | INTRAMUSCULAR | Status: DC
  Administered 2013-11-08 – 2013-11-12 (×8): 3 mL via INTRAVENOUS
  Administered 2013-11-13: 10 mL via INTRAVENOUS
  Administered 2013-11-13 – 2013-11-17 (×7): 3 mL via INTRAVENOUS

## 2013-11-08 MED ORDER — MOMETASONE FURO-FORMOTEROL FUM 100-5 MCG/ACT IN AERO
2.0000 | INHALATION_SPRAY | Freq: Two times a day (BID) | RESPIRATORY_TRACT | Status: DC
  Administered 2013-11-08 – 2013-11-09 (×2): 2 via RESPIRATORY_TRACT
  Filled 2013-11-08: qty 8.8

## 2013-11-08 MED ORDER — ALBUTEROL SULFATE (2.5 MG/3ML) 0.083% IN NEBU: 2.5000 mg | INHALATION_SOLUTION | RESPIRATORY_TRACT | Status: DC

## 2013-11-08 MED ORDER — LORAZEPAM 2 MG/ML IJ SOLN
0.5000 mg | Freq: Once | INTRAMUSCULAR | Status: AC
  Administered 2013-11-08: 0.5 mg via INTRAVENOUS
  Filled 2013-11-08: qty 1

## 2013-11-08 MED ORDER — METHYLPREDNISOLONE SODIUM SUCC 125 MG IJ SOLR
60.0000 mg | Freq: Four times a day (QID) | INTRAMUSCULAR | Status: DC
  Administered 2013-11-08 – 2013-11-09 (×3): 60 mg via INTRAVENOUS
  Filled 2013-11-08 (×4): qty 0.96
  Filled 2013-11-08: qty 2
  Filled 2013-11-08 (×2): qty 0.96

## 2013-11-08 MED ORDER — GUAIFENESIN ER 600 MG PO TB12
600.0000 mg | ORAL_TABLET | Freq: Two times a day (BID) | ORAL | Status: DC
  Administered 2013-11-08 – 2013-11-10 (×4): 600 mg via ORAL
  Filled 2013-11-08 (×7): qty 1

## 2013-11-08 MED ORDER — OXYCODONE-ACETAMINOPHEN 5-325 MG PO TABS
2.0000 | ORAL_TABLET | Freq: Four times a day (QID) | ORAL | Status: DC | PRN
  Administered 2013-11-08: 1 via ORAL
  Filled 2013-11-08 (×2): qty 2

## 2013-11-08 MED ORDER — MAGNESIUM SULFATE 50 % IJ SOLN
1.0000 g | Freq: Once | INTRAMUSCULAR | Status: DC
Start: 2013-11-08 — End: 2013-11-08

## 2013-11-08 MED ORDER — IPRATROPIUM-ALBUTEROL 0.5-2.5 (3) MG/3ML IN SOLN
3.0000 mL | RESPIRATORY_TRACT | Status: DC
  Administered 2013-11-08 – 2013-11-09 (×3): 3 mL via RESPIRATORY_TRACT
  Filled 2013-11-08 (×3): qty 3

## 2013-11-08 MED ORDER — MAGNESIUM SULFATE IN D5W 10-5 MG/ML-% IV SOLN
1.0000 g | Freq: Once | INTRAVENOUS | Status: AC
  Administered 2013-11-08: 1 g via INTRAVENOUS
  Filled 2013-11-08: qty 100

## 2013-11-08 MED ORDER — SIMVASTATIN 40 MG PO TABS
40.0000 mg | ORAL_TABLET | Freq: Every evening | ORAL | Status: DC
  Administered 2013-11-08 – 2013-11-10 (×2): 40 mg via ORAL
  Filled 2013-11-08 (×4): qty 1

## 2013-11-08 MED ORDER — ANASTROZOLE 1 MG PO TABS
1.0000 mg | ORAL_TABLET | Freq: Every day | ORAL | Status: DC
  Administered 2013-11-09 (×2): 1 mg via ORAL
  Filled 2013-11-08 (×2): qty 1

## 2013-11-08 MED ORDER — ALBUTEROL (5 MG/ML) CONTINUOUS INHALATION SOLN
10.0000 mg/h | INHALATION_SOLUTION | Freq: Once | RESPIRATORY_TRACT | Status: AC
  Administered 2013-11-08: 10 mg/h via RESPIRATORY_TRACT
  Filled 2013-11-08: qty 20

## 2013-11-08 MED ORDER — NITROGLYCERIN 0.4 MG SL SUBL: 0.4000 mg | SUBLINGUAL_TABLET | SUBLINGUAL | Status: DC | PRN

## 2013-11-08 MED ORDER — DOCUSATE SODIUM 100 MG PO CAPS
100.0000 mg | ORAL_CAPSULE | Freq: Two times a day (BID) | ORAL | Status: DC
  Administered 2013-11-08 – 2013-11-10 (×4): 100 mg via ORAL
  Filled 2013-11-08 (×7): qty 1

## 2013-11-08 MED ORDER — PANTOPRAZOLE SODIUM 40 MG PO TBEC
40.0000 mg | DELAYED_RELEASE_TABLET | Freq: Every day | ORAL | Status: DC
  Administered 2013-11-09: 40 mg via ORAL
  Filled 2013-11-08 (×3): qty 1

## 2013-11-08 MED ORDER — IPRATROPIUM BROMIDE 0.02 % IN SOLN: 0.5000 mg | RESPIRATORY_TRACT | Status: DC

## 2013-11-08 MED ORDER — OXYCODONE-ACETAMINOPHEN 5-325 MG PO TABS
1.0000 | ORAL_TABLET | Freq: Three times a day (TID) | ORAL | Status: DC | PRN
  Administered 2013-11-08 – 2013-11-09 (×3): 1 via ORAL
  Filled 2013-11-08 (×3): qty 1

## 2013-11-08 MED ORDER — IPRATROPIUM BROMIDE 0.02 % IN SOLN
0.5000 mg | Freq: Once | RESPIRATORY_TRACT | Status: AC
  Administered 2013-11-08: 0.5 mg via RESPIRATORY_TRACT
  Filled 2013-11-08: qty 2.5

## 2013-11-08 MED ORDER — HYDROCOD POLST-CHLORPHEN POLST 10-8 MG/5ML PO LQCR
5.0000 mL | Freq: Once | ORAL | Status: AC
  Administered 2013-11-08: 5 mL via ORAL
  Filled 2013-11-08: qty 5

## 2013-11-08 MED ORDER — BENZONATATE 100 MG PO CAPS
200.0000 mg | ORAL_CAPSULE | Freq: Three times a day (TID) | ORAL | Status: DC | PRN
  Administered 2013-11-09 (×2): 200 mg via ORAL
  Filled 2013-11-08 (×2): qty 2

## 2013-11-08 MED ORDER — GABAPENTIN 300 MG PO CAPS
300.0000 mg | ORAL_CAPSULE | Freq: Every day | ORAL | Status: DC
  Administered 2013-11-08 – 2013-11-10 (×3): 300 mg via ORAL
  Filled 2013-11-08 (×4): qty 1

## 2013-11-08 MED ORDER — ALBUTEROL SULFATE (2.5 MG/3ML) 0.083% IN NEBU: 2.5000 mg | INHALATION_SOLUTION | RESPIRATORY_TRACT | Status: DC | PRN

## 2013-11-08 MED ORDER — CLINDAMYCIN PHOSPHATE 600 MG/50ML IV SOLN
600.0000 mg | Freq: Three times a day (TID) | INTRAVENOUS | Status: DC
  Administered 2013-11-09 (×2): 600 mg via INTRAVENOUS
  Filled 2013-11-08 (×6): qty 50

## 2013-11-08 NOTE — ED Notes (Signed)
Pt believes she has bronchitis and says that she has been coughing up yellow sputum since last Wednesday.

## 2013-11-08 NOTE — ED Notes (Signed)
Patient transported to CT 

## 2013-11-08 NOTE — Progress Notes (Signed)
   CARE MANAGEMENT ED NOTE 11/08/2013  Patient:  [name], [name]   Account Number:  [id number]  Date Initiated:  11/08/2013  Documentation initiated by:  Livia Snellen  Subjective/Objective Assessment:   Patient presents to Ed with shortness of breath, and cough for yellow sputum.     Subjective/Objective Assessment Detail:   Patient taking prednisone and zpac at home without relief. Patient with pmhx of COPD     Action/Plan:   Action/Plan Detail:   Patient received continuous nebulizer and IV steroids in the ED.   Anticipated DC Date:       Status Recommendation to Physician:   Result of Recommendation:    Other ED Services  Consult Working [place]  Other    Choice offered to / List presented to:            Status of service:  Completed, signed off  ED Comments:   ED Comments Detail:  Patient reports she lives at home alone.  Patient wears oxygen at 2 liters at home at night but lately has been wearing it twenty four hours a day.  Patient receives her oxygen from [facility].  Patient reports she has a cane, walker, bedside commode, shower chair, hand held shower head.  Patient reports her daughter had to come and help her get dressed today because she wasn't feeling well. Patient reports she is able to perform her ADL's without difficulty usually.  Patient is not receiving any home health services currently.  Patient reports that she is planning to have a total shoulder replacement done at [facility] on January 29th 2015.  She reports after her surgery she will be going to [facility] for rehab.  EDCM exlained to patient that with home health she may receive a visiting RN, PT, OT, aide and Education officer, museum.  Patient reports she doesn't think she needs those services as of yet.  [facility] provided patient a list of private duty nursing services per patient request.  Patient thnakful for services.  No further EDCM needs at this time.

## 2013-11-08 NOTE — H&P (Signed)
[facility] History and Physical  Patient: [name]  BTD:[id number]  DOB: 19-Oct-1941  DOS: the patient was seen and examined on 11/08/2013 PCP: Unice Cobble, MD  Chief Complaint: Shortness of breath and cough  HPI: Jamie BARAY is a 73 y.o. female with Past medical history of COPD, GERD, CVA, CAD status post PCI, hypertension, breast cancer. The patient is coming from home. The patient presents with complaints of cough and shortness of breath that has been ongoing since Wednesday. She mentions that she has baseline shortness of breath and gets out of breath walking to and fro to the mailbox at her baseline. Since last Wednesday she has been having progressive shortness of breath. She also had a cough with yellow-green expectoration and mentions that because of the cough her chest has been hurting. No sick contacts. No hospitalization no recent travel. No aspiration. She is constipated but last bowel movement 4 days ago. She recently has been started on OxyIR for right shoulder pain for which she scheduled for a total replacement on 29th. Today's she saw her pulmonologist for preop clearance at which time as she was hypoxic and tachypneic was sent to the ED.  she has completed five-day course of azithromycin and of short taper prednisone from 20 mg in the last 5 days. She denies any orthopnea or PND chest pressure exertional chest pain. She is a denies any recent travel leg swelling or leg tenderness. She is a former smoker quit smoking 16 years ago. She uses 2 L of oxygen as needed but since last Friday has been using it continuously. She also has noted swelling at the base of her right side of mandible since this morning   Review of Systems: as mentioned in the history of present illness.  A Comprehensive review of the other systems is negative.  Past Medical History  Diagnosis Date  . Other emphysema   . Unspecified arthropathy, shoulder region   . Gastric ulcer,  unspecified as acute or chronic, without mention of hemorrhage, perforation, or obstruction   . Benign paroxysmal positional vertigo   . COPD (chronic obstructive pulmonary disease)   . Diverticula of colon   . Osteopenia   . Personal history of colonic polyps 2007    tubular adenoma high grade dysplasia  . Diverticulitis   . Hyperlipemia   . Fracture of ankle, bimalleolar, right, closed   . Hypoxemia   . C. difficile diarrhea   . Varicose vein   . Asthma   . GERD (gastroesophageal reflux disease)   . Hepatitis 1989  . Arthritis     "both shoulders"   . TIA (transient ischemic attack) 04/2008; 08/21/2012    "slight droop left lower lip after 08/21/12 TIA"  . On home oxygen therapy   . Ischemic cardiomyopathy     a. EF 45-50% 2011. b. EF 35-40% by cath 09/01/12.  . Night terrors, adult     dr Leonie Man referred  for PSG in 2011, and again in 2013   . Breast cancer 04/08/13 bx    left  . Allergy   . CAD (coronary artery disease)     a. ant MI 48 tx with TPA/PTCA. b. Prior stenting procedures including RCA stent, DES to LCx 04/2011. c. [place] -> DES  to prox LAD 08/2012.   Marland Kitchen Hypertension     TAKES FOR H/O STROKE  . CVA (cerebral vascular accident)   . Stroke, acute, embolic     Nov 1062 , [place]  [facility] , was treated with TPA.   . 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 Path G1 IDC, 1.6 cm, neg margin, neg SLN    Past Surgical History  Procedure Laterality Date  . Total shoulder arthroplasty  2012    left  . Orif ankle fracture  2007    right  . Martial megeti  7846'N    "bladder operation; not successful " (08/31/2012)  . Varicose vein surgery  1983; 1985    "both legs both times" (08/31/2012)  . Total shoulder replacement  12/05/2010    Dr Onnie Graham  . Abdominal hysterectomy      TAH w/BSO  . Augmentation mammaplasty  1975  . Coronary angioplasty with  stent placement  2010  . Coronary angioplasty  1990    "3 times" (08/31/2012)  . Colon surgery  07/07/2011    Dr. Hassell Done  . Appendectomy  1960  . Nose surgery      1977  . Breast augmenttation    . Catarct  right      RIGHT EYE  . Breast lumpectomy with needle localization and axillary sentinel lymph node bx Left 06/02/2013    Procedure: BREAST LUMPECTOMY WITH NEEDLE LOCALIZATION AND AXILLARY SENTINEL LYMPH NODE BX;  Surgeon: Haywood Lasso, MD;  Location: [facility];  Service: General;  Laterality: Left;  NEEDLE LOC BCG 7:30 NUC MED 9:30 RECONSTRUCTION TO FOLLOW 1 HOUR added   . Breast implant removal Left 06/02/2013    Procedure: REMOVAL BREAST IMPLANTS; LEFT BREAST CAPSULECTOMY WITH PLACEMENT OF TISSUE EXPANDER AND USE OF FLEX HD;  Surgeon: Crissie Reese, MD;  Location: [facility];  Service: Plastics;  Laterality: Left;   Social History:  reports that she quit smoking about 16 years ago. Her smoking use included Cigarettes. She has a 40 pack-year smoking history. She has never used smokeless tobacco. She reports that she drinks about 1.8 ounces of alcohol per week. She reports that she does not use illicit drugs. Independent for most of her  ADL.  Allergies  Allergen Reactions  . Escitalopram Oxalate Shortness Of Breath, Diarrhea and Nausea And Vomiting    Hot flashes, dizziness  . Indomethacin Other (See Comments)    "made me very very dizzy and made me have vertigo" (08/31/2012)  . Nsaids     Jerking, hot flashes, nausea  . Codeine Nausea And Vomiting    "I can take codeine in my cough medicine" (08/31/2012)  . Sulfonamide Derivatives Other (See Comments)    Drug induced hepatitis in 1989    Family History  Problem Relation Age of Onset  . Breast cancer Sister     x 2  . Colon cancer Neg Hx   . Diabetes Paternal Grandmother   . Uterine cancer Paternal Grandmother   . Lung cancer Paternal Grandmother   . Heart disease Mother   . Alcohol abuse Mother   . Heart disease Father    . Pancreatic cancer Cousin   . Colon polyps Sister   . Diabetes Maternal Uncle   . Heart disease Maternal Grandfather   . Heart disease Paternal Grandfather   . Stroke Paternal Grandfather     Prior to Admission medications   Medication Sig Start Date End Date Taking? Authorizing Provider  acetaminophen-codeine (TYLENOL #3) 300-30 MG per tablet Take 1 tablet by mouth every 6 (six) hours as needed for moderate pain.  Yes Historical Provider, MD  albuterol (PROVENTIL HFA;VENTOLIN HFA) 108 (90 BASE) MCG/ACT inhaler Inhale 2 puffs into the lungs every 6 (six) hours as needed. For shortness of breath 07/05/13  Yes Rigoberto Noel, MD  anastrozole (ARIMIDEX) 1 MG tablet Take 1 tablet (1 mg total) by mouth daily. 05/19/13  Yes Chauncey Cruel, MD  aspirin EC 81 MG tablet Take 81 mg by mouth every evening.    Yes Historical Provider, MD  benzonatate (TESSALON) 200 MG capsule Take 200 mg by mouth 3 (three) times daily as needed for cough.   Yes Historical Provider, MD  clonazePAM (KLONOPIN) 0.5 MG tablet Take 1 tablet (0.5 mg total) by mouth at bedtime. 07/21/13  Yes Carmen Dohmeier, MD  clopidogrel (PLAVIX) 75 MG tablet Take 75 mg by mouth daily.   Yes Historical Provider, MD  docusate sodium (COLACE) 100 MG capsule Take 100 mg by mouth daily.   Yes Historical Provider, MD  Fluticasone-Salmeterol (ADVAIR) 250-50 MCG/DOSE AEPB Inhale 1 puff into the lungs 2 (two) times daily.   Yes Historical Provider, MD  gabapentin (NEURONTIN) 300 MG capsule Take 300 mg by mouth at bedtime.   Yes Historical Provider, MD  L-Lysine 500 MG TABS Take 1 tablet by mouth daily.    Yes Historical Provider, MD  metoprolol tartrate (LOPRESSOR) 25 MG tablet Take 12.5 mg by mouth every evening.    Yes Historical Provider, MD  nitroGLYCERIN (NITROSTAT) 0.4 MG SL tablet Place 1 tablet (0.4 mg total) under the tongue every 5 (five) minutes as needed for chest pain (up to 3 doses). 09/02/12  Yes [name] N Dunn, PA-C  oxybutynin  (DITROPAN) 5 MG tablet Take 2.5 mg by mouth at bedtime.   Yes Historical Provider, MD  oxyCODONE-acetaminophen (PERCOCET/ROXICET) 5-325 MG per tablet Take 1 tablet by mouth every 8 (eight) hours as needed for severe pain.   Yes Historical Provider, MD  pantoprazole (PROTONIX) 40 MG tablet Take 40 mg by mouth daily.   Yes Historical Provider, MD  predniSONE (DELTASONE) 10 MG tablet Take 2 tabs daily with food x 5 days, take 1 tab x 5 days then stop. 11/03/13  Yes Rigoberto Noel, MD  Probiotic Product (PROBIOTIC DAILY PO) Take 1 tablet by mouth daily.    Yes Historical Provider, MD  simvastatin (ZOCOR) 40 MG tablet Take 40 mg by mouth every evening.   Yes Historical Provider, MD  tiotropium (SPIRIVA) 18 MCG inhalation capsule Place 18 mcg into inhaler and inhale daily.   Yes Historical Provider, MD    Physical Exam: Filed Vitals:   11/08/13 1550 11/08/13 1554 11/08/13 2020  BP:  157/74   Pulse:  95   Temp:  98.4 F (36.9 C)   TempSrc:  Oral   Resp:  19   SpO2: 96% 94% 94%    General: Alert, Awake and Oriented to Time, Place and Person. Appear in severe distress Eyes: PERRL ENT: Oral Mucosa clear dry. Swelling with redness at the right lower corner of mandible, with tenderness, redness and warmth to touch. No stridor, able to protect the airway,  Neck: no JVD Cardiovascular: S1 and S2 Present, no Murmur, Peripheral Pulses Present Respiratory: Bilateral Air entry equal and Decreased, no Crackles,extensive expiratory wheezes Abdomen: Bowel Sound Present, Soft and Non tender Skin: No Rash Extremities: No Pedal edema, no calf tenderness Neurologic: Grossly Unremarkable. Significantly anxious  Labs on Admission:  CBC:  Recent Labs Lab 11/08/13 1715  WBC 9.2  HGB 13.4  HCT 41.0  MCV  87.4  PLT 160    CMP     Component Value Date/Time   NA 138 11/08/2013 1715   NA 143 04/20/2013 1221   K 4.2 11/08/2013 1715   K 3.7 04/20/2013 1221   CL 98 11/08/2013 1715   CO2 25 11/08/2013 1715    CO2 27 04/20/2013 1221   GLUCOSE 94 11/08/2013 1715   GLUCOSE 108 04/20/2013 1221   GLUCOSE 101* 08/04/2006 1026   BUN 22 11/08/2013 1715   BUN 25.6 04/20/2013 1221   CREATININE 0.80 11/08/2013 1715   CREATININE 0.9 04/20/2013 1221   CREATININE 0.85 07/23/2011 1635   CALCIUM 9.3 11/08/2013 1715   CALCIUM 9.3 04/20/2013 1221   PROT 6.3 08/31/2013 1213   PROT 6.7 04/20/2013 1221   ALBUMIN 3.5 08/31/2013 1213   ALBUMIN 3.7 04/20/2013 1221   AST 24 08/31/2013 1213   AST 23 04/20/2013 1221   ALT 21 08/31/2013 1213   ALT 20 04/20/2013 1221   ALKPHOS 100 08/31/2013 1213   ALKPHOS 111 04/20/2013 1221   BILITOT 1.0 08/31/2013 1213   BILITOT 0.54 04/20/2013 1221   GFRNONAA 72* 11/08/2013 1715   GFRAA 83* 11/08/2013 1715    No results found for this basename: LIPASE, AMYLASE,  in the last 168 hours No results found for this basename: AMMONIA,  in the last 168 hours  No results found for this basename: CKTOTAL, CKMB, CKMBINDEX, TROPONINI,  in the last 168 hours BNP (last 3 results) No results found for this basename: PROBNP,  in the last 8760 hours  Radiological Exams on Admission: Dg Chest 2 View (if Patient Has Fever And/or Copd)  11/08/2013   CLINICAL DATA:  Shortness of breath. History of COPD and asthma. History of left breast cancer.  EXAM: CHEST  2 VIEW  COMPARISON:  06/03/2013.  FINDINGS: Stable heart and mediastinal contours. Atherosclerotic calcification of the thoracic aortic arch. No airspace disease or pleural effusion is identified. The lungs appear emphysematous. Negative for pneumothorax. The bones are diffusely osteopenic. Left shoulder arthroplasty is noted.  IMPRESSION: Emphysema.  No acute findings identified.   Electronically Signed   By: Curlene Dolphin M.D.   On: 11/08/2013 17:31   Ct Soft Tissue Neck W Contrast  11/08/2013   CLINICAL DATA:  Evaluate right submandibular mass  EXAM: CT NECK WITH CONTRAST  TECHNIQUE: Multidetector CT imaging of the neck was performed using the standard protocol  following the bolus administration of intravenous contrast.  CONTRAST:  128m OMNIPAQUE IOHEXOL 300 MG/ML  SOLN  COMPARISON:  Prior CT from 10/02/2011  FINDINGS: The visualized portions of the brain are unremarkable. Prominent vascular calcifications noted within the carotid siphons.  Partially visualized orbits are within normal limits.  Air-fluid level noted within the right maxillary sinus with scattered mucoperiosteal thickening within the anterior posterior ethmoidal air cells bilaterally. No mastoid effusion.  The parotid glands and left submandibular gland are normal. The right submandibular gland is enlarged and relatively hyper enhancing as compared to the contralateral left. There is adjacent inflammatory fat stranding within the right submandibular space with overlying skin thickening and induration. No obstructive calcified stones are seen within the sublingual duct. No obstructive mass lesions seen at the floor of mouth or within the right submandibular gland itself. No loculated fluid collection to suggest abscess.  The oral cavity and oropharynx are within normal limits. There is mild asymmetric in effacement of the right parapharyngeal fat, likely related to inflammatory changes involving the right submandibular space. The nasopharynx, hypopharynx, and  supraglottic larynx are normal. Vocal cords are symmetric in appearance without evidence of mass lesion or other abnormality. Evaluation of the subglottic airway is somewhat limited due to motion, but grossly normal.  The thyroid gland is grossly normal.  No pathologically enlarged cervical lymph nodes are identified within the neck. No pathologically enlarged nodes are seen within the superior mediastinum.  Severe emphysematous changes noted within the partially visualized lungs. Irregular pleural-parenchymal thickening/scarring present at the left lung apex.  Normal intravascular enhancement seen throughout the neck.  No focal lytic or blastic  osseous lesions are identified. Left shoulder arthroplasty partially visualized.  IMPRESSION: 1. Enlargement and relative hyper enhancement of the right submandibular gland with associated inflammatory fat stranding, most compatible with acute sialadenitis. No obstructive stone or mass lesion identified. No loculated fluid collection to suggest abscess. 2. Severe emphysema. 3. Mild paranasal sinus disease as above.   Electronically Signed   By: Jeannine Boga M.D.   On: 11/08/2013 20:19    Telemetry sinus tachycardia.  Assessment/Plan Principal Problem:   COPD exacerbation Active Problems:   CORONARY ARTERY DISEASE   CVA   Acute bronchitis   CEREBROVASCULAR ACCIDENT, HX OF   Hypertension   Breast cancer, left, LIQ, Stage 1, receptor+, her2 neg   Sialadenitis   1. COPD exacerbation The patient is presenting with numbness of cough and shortness of breath. Her chest x-ray is clear and does not have any signs of pneumonia. She does have extensive wheezing on examination appears significantly tachypneic and tachycardic. With this I would admit her to the hospital step down unit as her work of breathing appears significantly elevated. She has received IV Solu-Medrol I will continue 60 mg every 6 hours. I would also continue her on clindamycin. The choice of antibiotics is to include coverage for MRSA, community-acquired lung pathogen, oral pathogen for her submandibular sialedenitis. I would continue her on duo nebs every 4 hours. Mucinex.   2.sialedenitis. The patient is presenting with swelling of her right jaw. CT scan of the face and neck shows swelling and inflammation of the submandibular gland without any stone. At present I would treat her with IV clindamycin which will cover oral organisms. At present it does not appear that her airway is compromised I would continue monitoring step down unit.  3. history of coronary artery disease and CVA I would continue her on aspirin,  Plavix  4. constipation Continue with Dulcolax and MiraLax. One dose of milk of magnesia.   DVT Prophylaxis: subcutaneous Heparin Nutrition: Advance as tolerated cardiac  Code Status: Full  Family Communication: Daughter was present at bedside, opportunity was given to ask question and all questions were answered satisfactorily at the time of interview. Disposition: Admitted to inpatient in step-down unit.  Author: Berle Mull, MD [facility] Pager: [phone] 11/08/2013, 9:00 PM    If 7PM-7AM, please contact night-coverage [email or web address] Password [email or web address]

## 2013-11-08 NOTE — ED Notes (Signed)
Bed: WA08 Expected date:  Expected time:  Means of arrival:  Comments: EMS-SOB 

## 2013-11-08 NOTE — Progress Notes (Signed)
Subjective:    Patient ID: [name], female    DOB: 20-Aug-1941, 73 y.o.   MRN: [id number]  HPI  [name] patient with GOLD C COPD  11/08/2013 Sick visit >>[name] is here to see me because she is profoundly short of breath.  She has been having a lot of coughing, dyspnea, and mucus production despite the fact that she has been taking prednisone and azithromycin lately.  She denies fever or chills.  Her grandaughter has bronchitis with a fever.    Past Medical History  Diagnosis Date  . Other emphysema   . Unspecified arthropathy, shoulder region   . Gastric ulcer, unspecified as acute or chronic, without mention of hemorrhage, perforation, or obstruction   . Benign paroxysmal positional vertigo   . COPD (chronic obstructive pulmonary disease)   . Diverticula of colon   . Osteopenia   . Personal history of colonic polyps 2007    tubular adenoma high grade dysplasia  . Diverticulitis   . Hyperlipemia   . Fracture of ankle, bimalleolar, right, closed   . Hypoxemia   . C. difficile diarrhea   . Varicose vein   . Asthma   . GERD (gastroesophageal reflux disease)   . Hepatitis 1989  . Arthritis     "both shoulders"   . TIA (transient ischemic attack) 04/2008; 08/21/2012    "slight droop left lower lip after 08/21/12 TIA"  . On home oxygen therapy   . Ischemic cardiomyopathy     a. EF 45-50% 2011. b. EF 35-40% by cath 09/01/12.  . Night terrors, adult     dr Leonie Man referred  for PSG in 2011, and again in 2013   . Breast cancer 04/08/13 bx    left  . Allergy   . CAD (coronary artery disease)     a. ant MI 47 tx with TPA/PTCA. b. Prior stenting procedures including RCA stent, DES to LCx 04/2011. c. [place] -> DES  to prox LAD 08/2012.   Marland Kitchen Hypertension     TAKES FOR H/O STROKE  . CVA (cerebral vascular accident)   . Stroke, acute, embolic     Nov 3151 , [place]  [facility] , was treated with TPA.   . Breast cancer, left, LIQ, Stage 1, receptor+, her2 neg 04/14/2013    June 2014  invasive ductal cancer, rx lumpectomy, removal of implant and capsuel, SLN on 81414. Path G1 IDC, 1.6 cm, neg margin, neg SLN       Review of Systems     Objective:   Physical Exam Filed Vitals:   11/08/13 1459  BP: 136/84  Pulse: 93  Temp: 98 F (36.7 C)  Height: 5' 4" (1.626 m)  Weight: 165 lb (74.844 kg)  SpO2: 90%   RA  Gen: acutely ill appearing, dyspnic HEENT: NCAT PULM: wheezing bilaterally CV: RRR, normal S1/S2 AB: BS+, soft, nontender Ext: warm, no edema       Assessment & Plan:   COPD Exacerbatoin> severe, failing outpatient therapy with hypoxemia >go to [facility] right now by EMS >steroids, chest x-ray >will need admission, TRH service, PCCM consult  Called her daughter (RN at [facility] ED) to discuss, she is aware she is coming.  Updated Medication List Outpatient Encounter Prescriptions as of 11/08/2013  Medication Sig  . acetaminophen-codeine (TYLENOL #3) 300-30 MG per tablet Take 1 tablet by mouth every 6 (six) hours as needed for moderate pain.  Marland Kitchen ADVAIR DISKUS 250-50 MCG/DOSE AEPB INHALE 1 PUFF EVERY 12 HOURS  AS DIRECTED  . albuterol (PROVENTIL HFA;VENTOLIN HFA) 108 (90 BASE) MCG/ACT inhaler Inhale 2 puffs into the lungs every 6 (six) hours as needed. For shortness of breath  . anastrozole (ARIMIDEX) 1 MG tablet Take 1 tablet (1 mg total) by mouth daily.  Marland Kitchen aspirin EC 81 MG tablet Take 81 mg by mouth daily.  . benzonatate (TESSALON) 200 MG capsule Take 200 mg by mouth 3 (three) times daily as needed for cough.  . clonazePAM (KLONOPIN) 0.5 MG tablet Take 1 tablet (0.5 mg total) by mouth at bedtime.  . clopidogrel (PLAVIX) 75 MG tablet Take 75 mg by mouth daily.  Marland Kitchen docusate sodium (COLACE) 100 MG capsule Take 100 mg by mouth daily.  Marland Kitchen gabapentin (NEURONTIN) 300 MG capsule Take 1 capsule (300 mg total) by mouth Nightly.  Marland Kitchen HYDROcodone-acetaminophen (NORCO/VICODIN) 5-325 MG per tablet Take 1 tablet by mouth every 6 (six) hours as needed for moderate pain.  Marland Kitchen  L-Lysine 500 MG TABS Take 1 tablet by mouth daily.   . metoprolol tartrate (LOPRESSOR) 25 MG tablet Take 25 mg by mouth daily. Marland Kitchen5 tablet at night  . nitroGLYCERIN (NITROSTAT) 0.4 MG SL tablet Place 1 tablet (0.4 mg total) under the tongue every 5 (five) minutes as needed for chest pain (up to 3 doses).  Marland Kitchen oxybutynin (DITROPAN) 5 MG tablet Take 2.5 mg by mouth at bedtime.  . pantoprazole (PROTONIX) 40 MG tablet Take 40 mg by mouth daily.  . predniSONE (DELTASONE) 10 MG tablet Take 2 tabs daily with food x 5 days, take 1 tab x 5 days then stop.  . Probiotic Product (PROBIOTIC DAILY PO) Take 1 tablet by mouth daily.   . simvastatin (ZOCOR) 40 MG tablet Take 40 mg by mouth every evening.  Marland Kitchen SPIRIVA HANDIHALER 18 MCG inhalation capsule INHALE THE CONTENTS OF 1 CAPSULE DAILY  . [DISCONTINUED] azithromycin (ZITHROMAX) 250 MG tablet Take 1 tablet (250 mg total) by mouth as directed.

## 2013-11-08 NOTE — ED Notes (Signed)
Pt presents via EMS with c/o shortness of breath. Pt was at her doctor's office, [facility], and EMS was called reference shortness of breath. Pt was initially 90% RA when EMS arrived and is now 96% 3L of O2 via [place]. Pt does have home oxygen as needed. Per pt, she was given a breathing tx at the office.

## 2013-11-08 NOTE — ED Provider Notes (Signed)
CSN: [id number]     Arrival date & time 11/08/13  64 History   First MD Initiated Contact with Patient 11/08/13 1636     Chief Complaint  Patient presents with  . Shortness of Breath   (Consider location/radiation/quality/duration/timing/severity/associated sxs/prior Treatment) HPI Comments: 22F here with SOB. Sent from Pulmonlogist's office for COPD. Patient had steroids and Z-pack prescribed earlier this week with no relief. Received breathing tx in Pulmonologist's office with no relief.  Patient is a 73 y.o. female presenting with shortness of breath. The history is provided by the patient.  Shortness of Breath Severity:  Moderate Onset quality:  Gradual Duration:  6 days Timing:  Constant Progression:  Worsening Chronicity:  Recurrent Context: weather changes   Context: not URI   Relieved by:  Nothing Ineffective treatments: steroids, antibiotics. Associated symptoms: cough and swollen glands (R submandibular)   Associated symptoms: no abdominal pain, no chest pain, no rash and no syncope     Past Medical History  Diagnosis Date  . Other emphysema   . Unspecified arthropathy, shoulder region   . Gastric ulcer, unspecified as acute or chronic, without mention of hemorrhage, perforation, or obstruction   . Benign paroxysmal positional vertigo   . COPD (chronic obstructive pulmonary disease)   . Diverticula of colon   . Osteopenia   . Personal history of colonic polyps 2007    tubular adenoma high grade dysplasia  . Diverticulitis   . Hyperlipemia   . Fracture of ankle, bimalleolar, right, closed   . Hypoxemia   . C. difficile diarrhea   . Varicose vein   . Asthma   . GERD (gastroesophageal reflux disease)   . Hepatitis 1989  . Arthritis     "both shoulders"   . TIA (transient ischemic attack) 04/2008; 08/21/2012    "slight droop left lower lip after 08/21/12 TIA"  . On home oxygen therapy   . Ischemic cardiomyopathy     a. EF 45-50% 2011. b. EF 35-40% by cath  09/01/12.  . Night terrors, adult     dr Leonie Man referred  for PSG in 2011, and again in 2013   . Breast cancer 04/08/13 bx    left  . Allergy   . CAD (coronary artery disease)     a. ant MI 8 tx with TPA/PTCA. b. Prior stenting procedures including RCA stent, DES to LCx 04/2011. c. [place] -> DES  to prox LAD 08/2012.   Marland Kitchen Hypertension     TAKES FOR H/O STROKE  . CVA (cerebral vascular accident)   . Stroke, acute, embolic     Nov 3662 , [facility] , was treated with TPA.   . 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 Path G1 IDC, 1.6 cm, neg margin, neg SLN    Past Surgical History  Procedure Laterality Date  . Total shoulder arthroplasty  2012    left  . Orif ankle fracture  2007    right  . Martial megeti  9476'L    "bladder operation; not successful " (08/31/2012)  . Varicose vein surgery  1983; 1985    "both legs both times" (08/31/2012)  . Total shoulder replacement  12/05/2010    Dr Onnie Graham  . Abdominal hysterectomy      TAH w/BSO  . Augmentation mammaplasty  1975  . Coronary angioplasty with stent placement  2010  .  Coronary angioplasty  1990    "3 times" (08/31/2012)  . Colon surgery  07/07/2011    Dr. Hassell Done  . Appendectomy  1960  . Nose surgery      1977  . Breast augmenttation    . Catarct  right      RIGHT EYE  . Breast lumpectomy with needle localization and axillary sentinel lymph node bx Left 06/02/2013    Procedure: BREAST LUMPECTOMY WITH NEEDLE LOCALIZATION AND AXILLARY SENTINEL LYMPH NODE BX;  Surgeon: Haywood Lasso, MD;  Location: [facility];  Service: General;  Laterality: Left;  NEEDLE LOC BCG 7:30 NUC MED 9:30 RECONSTRUCTION TO FOLLOW 1 HOUR added   . Breast implant removal Left 06/02/2013    Procedure: REMOVAL BREAST IMPLANTS; LEFT BREAST CAPSULECTOMY WITH PLACEMENT OF TISSUE EXPANDER AND USE OF FLEX HD;  Surgeon: Crissie Reese, MD;   Location: [facility];  Service: Plastics;  Laterality: Left;   Family History  Problem Relation Age of Onset  . Breast cancer Sister     x 2  . Colon cancer Neg Hx   . Diabetes Paternal Grandmother   . Uterine cancer Paternal Grandmother   . Lung cancer Paternal Grandmother   . Heart disease Mother   . Alcohol abuse Mother   . Heart disease Father   . Pancreatic cancer Cousin   . Colon polyps Sister   . Diabetes Maternal Uncle   . Heart disease Maternal Grandfather   . Heart disease Paternal Grandfather   . Stroke Paternal Grandfather    History  Substance Use Topics  . Smoking status: Former Smoker -- 1.00 packs/day for 40 years    Types: Cigarettes    Quit date: 10/20/1997  . Smokeless tobacco: Never Used  . Alcohol Use: 1.8 oz/week    3 Glasses of wine per week     Comment: occasionally   OB History   Grav Para Term Preterm Abortions TAB SAB Ect Mult Living                 Review of Systems  Respiratory: Positive for cough and shortness of breath.   Cardiovascular: Negative for chest pain and syncope.  Gastrointestinal: Negative for abdominal pain.  Skin: Negative for rash.  All other systems reviewed and are negative.    Allergies  Escitalopram oxalate; Indomethacin; Codeine; and Sulfonamide derivatives  Home Medications   Current Outpatient Rx  Name  Route  Sig  Dispense  Refill  . acetaminophen-codeine (TYLENOL #3) 300-30 MG per tablet   Oral   Take 1 tablet by mouth every 6 (six) hours as needed for moderate pain.         Marland Kitchen albuterol (PROVENTIL HFA;VENTOLIN HFA) 108 (90 BASE) MCG/ACT inhaler   Inhalation   Inhale 2 puffs into the lungs every 6 (six) hours as needed. For shortness of breath   1 Inhaler   0   . anastrozole (ARIMIDEX) 1 MG tablet   Oral   Take 1 tablet (1 mg total) by mouth daily.   90 tablet   3   . aspirin EC 81 MG tablet   Oral   Take 81 mg by mouth every evening.          . benzonatate (TESSALON) 200 MG capsule   Oral    Take 200 mg by mouth 3 (three) times daily as needed for cough.         . clonazePAM (KLONOPIN) 0.5 MG tablet   Oral   Take 1 tablet (  0.5 mg total) by mouth at bedtime.   90 tablet   1     Pharmacy Fax [phone]   . clopidogrel (PLAVIX) 75 MG tablet   Oral   Take 75 mg by mouth daily.         Marland Kitchen docusate sodium (COLACE) 100 MG capsule   Oral   Take 100 mg by mouth daily.         . Fluticasone-Salmeterol (ADVAIR) 250-50 MCG/DOSE AEPB   Inhalation   Inhale 1 puff into the lungs 2 (two) times daily.         Marland Kitchen gabapentin (NEURONTIN) 300 MG capsule   Oral   Take 300 mg by mouth at bedtime.         Marland Kitchen L-Lysine 500 MG TABS   Oral   Take 1 tablet by mouth daily.          . metoprolol tartrate (LOPRESSOR) 25 MG tablet   Oral   Take 12.5 mg by mouth every evening.          . nitroGLYCERIN (NITROSTAT) 0.4 MG SL tablet   Sublingual   Place 1 tablet (0.4 mg total) under the tongue every 5 (five) minutes as needed for chest pain (up to 3 doses).   25 tablet   4   . oxybutynin (DITROPAN) 5 MG tablet   Oral   Take 2.5 mg by mouth at bedtime.         Marland Kitchen oxyCODONE-acetaminophen (PERCOCET/ROXICET) 5-325 MG per tablet   Oral   Take 1 tablet by mouth every 8 (eight) hours as needed for severe pain.         . pantoprazole (PROTONIX) 40 MG tablet   Oral   Take 40 mg by mouth daily.         . predniSONE (DELTASONE) 10 MG tablet      Take 2 tabs daily with food x 5 days, take 1 tab x 5 days then stop.   15 tablet   0   . Probiotic Product (PROBIOTIC DAILY PO)   Oral   Take 1 tablet by mouth daily.          . simvastatin (ZOCOR) 40 MG tablet   Oral   Take 40 mg by mouth every evening.         . tiotropium (SPIRIVA) 18 MCG inhalation capsule   Inhalation   Place 18 mcg into inhaler and inhale daily.          BP 157/74  Pulse 95  Temp(Src) 98.4 F (36.9 C) (Oral)  Resp 19  SpO2 94% Physical Exam  Nursing note and vitals  reviewed. Constitutional: She is oriented to person, place, and time. She appears well-developed and well-nourished. No distress.  HENT:  Head: Normocephalic and atraumatic.  Eyes: EOM are normal. Pupils are equal, round, and reactive to light.  Neck: Normal range of motion. Neck supple. No tracheal tenderness present. No tracheal deviation present.    Cardiovascular: Normal rate and regular rhythm.  Exam reveals no friction rub.   No murmur heard. Pulmonary/Chest: No stridor. She is in respiratory distress (moderate, talking in choppy sentences). She has wheezes (diffuse). She has no rales.  Abdominal: Soft. She exhibits no distension. There is no tenderness. There is no rebound.  Musculoskeletal: Normal range of motion. She exhibits no edema.  Neurological: She is alert and oriented to person, place, and time.  Skin: She is not diaphoretic.    ED Course  Procedures (including critical  care time) Labs Review Labs Reviewed  CBC  BASIC METABOLIC PANEL   Imaging Review Dg Chest 2 View (if Patient Has Fever And/or Copd)  11/08/2013   CLINICAL DATA:  Shortness of breath. History of COPD and asthma. History of left breast cancer.  EXAM: CHEST  2 VIEW  COMPARISON:  06/03/2013.  FINDINGS: Stable heart and mediastinal contours. Atherosclerotic calcification of the thoracic aortic arch. No airspace disease or pleural effusion is identified. The lungs appear emphysematous. Negative for pneumothorax. The bones are diffusely osteopenic. Left shoulder arthroplasty is noted.  IMPRESSION: Emphysema.  No acute findings identified.   Electronically Signed   By: Curlene Dolphin M.D.   On: 11/08/2013 17:31   Ct Soft Tissue Neck W Contrast  11/08/2013   CLINICAL DATA:  Evaluate right submandibular mass  EXAM: CT NECK WITH CONTRAST  TECHNIQUE: Multidetector CT imaging of the neck was performed using the standard protocol following the bolus administration of intravenous contrast.  CONTRAST:  1106m OMNIPAQUE  IOHEXOL 300 MG/ML  SOLN  COMPARISON:  Prior CT from 10/02/2011  FINDINGS: The visualized portions of the brain are unremarkable. Prominent vascular calcifications noted within the carotid siphons.  Partially visualized orbits are within normal limits.  Air-fluid level noted within the right maxillary sinus with scattered mucoperiosteal thickening within the anterior posterior ethmoidal air cells bilaterally. No mastoid effusion.  The parotid glands and left submandibular gland are normal. The right submandibular gland is enlarged and relatively hyper enhancing as compared to the contralateral left. There is adjacent inflammatory fat stranding within the right submandibular space with overlying skin thickening and induration. No obstructive calcified stones are seen within the sublingual duct. No obstructive mass lesions seen at the floor of mouth or within the right submandibular gland itself. No loculated fluid collection to suggest abscess.  The oral cavity and oropharynx are within normal limits. There is mild asymmetric in effacement of the right parapharyngeal fat, likely related to inflammatory changes involving the right submandibular space. The nasopharynx, hypopharynx, and supraglottic larynx are normal. Vocal cords are symmetric in appearance without evidence of mass lesion or other abnormality. Evaluation of the subglottic airway is somewhat limited due to motion, but grossly normal.  The thyroid gland is grossly normal.  No pathologically enlarged cervical lymph nodes are identified within the neck. No pathologically enlarged nodes are seen within the superior mediastinum.  Severe emphysematous changes noted within the partially visualized lungs. Irregular pleural-parenchymal thickening/scarring present at the left lung apex.  Normal intravascular enhancement seen throughout the neck.  No focal lytic or blastic osseous lesions are identified. Left shoulder arthroplasty partially visualized.  IMPRESSION:  1. Enlargement and relative hyper enhancement of the right submandibular gland with associated inflammatory fat stranding, most compatible with acute sialadenitis. No obstructive stone or mass lesion identified. No loculated fluid collection to suggest abscess. 2. Severe emphysema. 3. Mild paranasal sinus disease as above.   Electronically Signed   By: BJeannine BogaM.D.   On: 11/08/2013 20:19    EKG Interpretation    Date/Time:    Ventricular Rate:    PR Interval:    QRS Duration:   QT Interval:    QTC Calculation:   R Axis:     Text Interpretation:             CRITICAL CARE Performed by: WOsvaldo Shipper  Total critical care time: 30 minutes  Critical care time was exclusive of separately billable procedures and treating other patients.  Critical care was  necessary to treat or prevent imminent or life-threatening deterioration.  Critical care was time spent personally by me on the following activities: development of treatment plan with patient and/or surrogate as well as nursing, discussions with consultants, evaluation of patient's response to treatment, examination of patient, obtaining history from patient or surrogate, ordering and performing treatments and interventions, ordering and review of laboratory studies, ordering and review of radiographic studies, pulse oximetry and re-evaluation of patient's condition.   MDM   1. COPD exacerbation   2. Shortness of breath   3. Acute bronchitis   4. Breast cancer, left breast   5. Essential hypertension, benign   6. Constipation, acute   7. Sialadenitis   8. Influenza A   9. Abdominal pain    73 year old female presents with shortness of breath. His been going on for the past 6 days. She received prednisone and a Z-Pak for her PCP without relief. She was sent from Dr. Anastasia Pall office today for hypoxia. She is 90% on room air. She is on oxygen at night but not all the time. She was put on option all the  time on Friday by her regular doctor. She reports continued shortness of breath, productive cough with yellow sputum, sore throat. She denies any fevers, chest pain. She also noted right submandibular swelling that started this morning. He is tender to palpation. On exam, her vitals are stable here. She is in the mid 90s on 2 L of O2. Has a prolonged expiration phase and diffuse wheezes in her lungs. She is refusing a breathing treatment at this time stating she got one in the office earlier. Patient also has right submandibular swelling with tenderness and mild redness over top. Her submental space is soft and there is no elevation of the floor mouth. She is not have any airway management issues at this time. We will CT her neck to determine etiology of this, question possible abscess. We will also give her steroids and admit her for her COPD exacerbation. After 1st hour long breathing treatment, patient still wheezing. 2nd hour long tx initiated. Will go to [facility]  Patient's CT shows acute sialoadenitis.     Osvaldo Shipper, MD 11/09/13 [phone]

## 2013-11-09 ENCOUNTER — Encounter (HOSPITAL_COMMUNITY): Payer: Self-pay | Admitting: Pharmacy Technician

## 2013-11-09 ENCOUNTER — Inpatient Hospital Stay (HOSPITAL_COMMUNITY): Admission: RE | Admit: 2013-11-09 | Payer: Medicare Other | Source: Ambulatory Visit

## 2013-11-09 ENCOUNTER — Inpatient Hospital Stay (HOSPITAL_COMMUNITY): Payer: Medicare Other

## 2013-11-09 ENCOUNTER — Ambulatory Visit: Payer: Medicare Other | Admitting: Neurology

## 2013-11-09 DIAGNOSIS — J9600 Acute respiratory failure, unspecified whether with hypoxia or hypercapnia: Secondary | ICD-10-CM

## 2013-11-09 DIAGNOSIS — K5900 Constipation, unspecified: Secondary | ICD-10-CM

## 2013-11-09 DIAGNOSIS — R109 Unspecified abdominal pain: Secondary | ICD-10-CM

## 2013-11-09 DIAGNOSIS — K1120 Sialoadenitis, unspecified: Secondary | ICD-10-CM

## 2013-11-09 DIAGNOSIS — J111 Influenza due to unidentified influenza virus with other respiratory manifestations: Secondary | ICD-10-CM

## 2013-11-09 LAB — COMPREHENSIVE METABOLIC PANEL
ALBUMIN: 3.4 g/dL — AB (ref 3.5–5.2)
ALT: 38 U/L — ABNORMAL HIGH (ref 0–35)
AST: 34 U/L (ref 0–37)
Alkaline Phosphatase: 127 U/L — ABNORMAL HIGH (ref 39–117)
BILIRUBIN TOTAL: 0.3 mg/dL (ref 0.3–1.2)
BUN: 24 mg/dL — ABNORMAL HIGH (ref 6–23)
CALCIUM: 8.8 mg/dL (ref 8.4–10.5)
CO2: 25 mEq/L (ref 19–32)
CREATININE: 0.76 mg/dL (ref 0.50–1.10)
Chloride: 98 mEq/L (ref 96–112)
GFR calc Af Amer: >90 mL/min (ref >90)
GFR calc non Af Amer: 82 mL/min — ABNORMAL LOW (ref >90)
Glucose, Bld: 190 mg/dL — ABNORMAL HIGH (ref 70–99)
Potassium: 4.1 mEq/L (ref 3.7–5.3)
Sodium: 138 mEq/L (ref 137–147)
TOTAL PROTEIN: 6.9 g/dL (ref 6.0–8.3)

## 2013-11-09 LAB — LEGIONELLA ANTIGEN, URINE: Legionella Antigen, Urine: NEGATIVE

## 2013-11-09 LAB — CBC
HEMATOCRIT: 40.6 % (ref 36.0–46.0)
HEMOGLOBIN: 13.3 g/dL (ref 12.0–15.0)
MCH: 28.5 pg (ref 26.0–34.0)
MCHC: 32.8 g/dL (ref 30.0–36.0)
MCV: 86.9 fL (ref 78.0–100.0)
Platelets: 144 10*3/uL — ABNORMAL LOW (ref 150–400)
RBC: 4.67 MIL/uL (ref 3.87–5.11)
RDW: 13.8 % (ref 11.5–15.5)
WBC: 8.9 10*3/uL (ref 4.0–10.5)

## 2013-11-09 LAB — BLOOD GAS, ARTERIAL
Acid-Base Excess: 1.8 mmol/L (ref 0.0–2.0)
BICARBONATE: 26.7 meq/L — AB (ref 20.0–24.0)
Drawn by: 23532
FIO2: 0.5 %
O2 Saturation: 98.1 %
PEEP: 8 cmH2O
PH ART: 7.379 (ref 7.350–7.450)
Patient temperature: 99.6
Pressure control: 4 cmH2O
RATE: 15 resp/min
TCO2: 23.9 mmol/L (ref 0–100)
pCO2 arterial: 46.7 mmHg — ABNORMAL HIGH (ref 35.0–45.0)
pO2, Arterial: 125 mmHg — ABNORMAL HIGH (ref 80.0–100.0)

## 2013-11-09 LAB — PROTIME-INR
INR: 1.04 (ref 0.00–1.49)
Prothrombin Time: 13.4 seconds (ref 11.6–15.2)

## 2013-11-09 LAB — STREP PNEUMONIAE URINARY ANTIGEN: STREP PNEUMO URINARY ANTIGEN: NEGATIVE

## 2013-11-09 LAB — INFLUENZA PANEL BY PCR (TYPE A & B)
H1N1FLUPCR: NOT DETECTED
Influenza A By PCR: POSITIVE — AB
Influenza B By PCR: NEGATIVE

## 2013-11-09 LAB — MRSA PCR SCREENING: MRSA by PCR: NEGATIVE

## 2013-11-09 MED ORDER — DEXMEDETOMIDINE HCL IN NACL 400 MCG/100ML IV SOLN
0.0000 ug/kg/h | INTRAVENOUS | Status: DC
  Administered 2013-11-09: 0.4 ug/kg/h via INTRAVENOUS
  Administered 2013-11-09: 0.1 ug/kg/h via INTRAVENOUS
  Administered 2013-11-10: 1.2 ug/kg/h via INTRAVENOUS
  Administered 2013-11-10: 0.5 ug/kg/h via INTRAVENOUS
  Administered 2013-11-10 (×2): 1.2 ug/kg/h via INTRAVENOUS
  Administered 2013-11-10: 0.5 ug/kg/h via INTRAVENOUS
  Administered 2013-11-10: 0.3 ug/kg/h via INTRAVENOUS
  Administered 2013-11-11 (×3): 1.2 ug/kg/h via INTRAVENOUS
  Administered 2013-11-11: 1 ug/kg/h via INTRAVENOUS
  Administered 2013-11-12 (×3): 1.2 ug/kg/h via INTRAVENOUS
  Administered 2013-11-12: 1 ug/kg/h via INTRAVENOUS
  Filled 2013-11-09: qty 100
  Filled 2013-11-09 (×4): qty 50
  Filled 2013-11-09: qty 100
  Filled 2013-11-09: qty 50
  Filled 2013-11-09: qty 100
  Filled 2013-11-09: qty 50
  Filled 2013-11-09 (×7): qty 100

## 2013-11-09 MED ORDER — BIOTENE DRY MOUTH MT LIQD
15.0000 mL | Freq: Two times a day (BID) | OROMUCOSAL | Status: DC
  Administered 2013-11-10 – 2013-11-14 (×10): 15 mL via OROMUCOSAL

## 2013-11-09 MED ORDER — SENNA 8.6 MG PO TABS
1.0000 | ORAL_TABLET | Freq: Every day | ORAL | Status: DC | PRN
  Administered 2013-11-09: 8.6 mg via ORAL
  Filled 2013-11-09: qty 1

## 2013-11-09 MED ORDER — METHYLPREDNISOLONE SODIUM SUCC 125 MG IJ SOLR
60.0000 mg | Freq: Two times a day (BID) | INTRAMUSCULAR | Status: DC
  Administered 2013-11-09 – 2013-11-13 (×8): 60 mg via INTRAVENOUS
  Filled 2013-11-09: qty 2
  Filled 2013-11-09 (×5): qty 0.96
  Filled 2013-11-09 (×2): qty 2
  Filled 2013-11-09 (×5): qty 0.96

## 2013-11-09 MED ORDER — CHLORHEXIDINE GLUCONATE 0.12 % MT SOLN
15.0000 mL | Freq: Two times a day (BID) | OROMUCOSAL | Status: DC
  Administered 2013-11-09 – 2013-11-16 (×13): 15 mL via OROMUCOSAL
  Filled 2013-11-09 (×12): qty 15

## 2013-11-09 MED ORDER — HYDROCOD POLST-CHLORPHEN POLST 10-8 MG/5ML PO LQCR
5.0000 mL | Freq: Two times a day (BID) | ORAL | Status: DC
  Administered 2013-11-09 – 2013-11-13 (×7): 5 mL via ORAL
  Filled 2013-11-09 (×7): qty 5

## 2013-11-09 MED ORDER — POLYETHYLENE GLYCOL 3350 17 G PO PACK
17.0000 g | PACK | Freq: Every day | ORAL | Status: DC
  Administered 2013-11-09 – 2013-11-14 (×5): 17 g via ORAL
  Filled 2013-11-09 (×7): qty 1

## 2013-11-09 MED ORDER — SENNOSIDES-DOCUSATE SODIUM 8.6-50 MG PO TABS
1.0000 | ORAL_TABLET | Freq: Two times a day (BID) | ORAL | Status: DC
  Administered 2013-11-09 – 2013-11-14 (×10): 1 via ORAL
  Filled 2013-11-09 (×16): qty 1

## 2013-11-09 MED ORDER — HALOPERIDOL LACTATE 5 MG/ML IJ SOLN: 5.0000 mg | Freq: Once | INTRAMUSCULAR | Status: DC

## 2013-11-09 MED ORDER — BUDESONIDE 0.5 MG/2ML IN SUSP
0.5000 mg | Freq: Two times a day (BID) | RESPIRATORY_TRACT | Status: DC
  Administered 2013-11-09 – 2013-11-10 (×2): 0.5 mg via RESPIRATORY_TRACT
  Filled 2013-11-09 (×4): qty 2

## 2013-11-09 MED ORDER — MIDAZOLAM HCL 2 MG/2ML IJ SOLN
INTRAMUSCULAR | Status: AC
  Filled 2013-11-09: qty 4

## 2013-11-09 MED ORDER — FENTANYL CITRATE 0.05 MG/ML IJ SOLN
INTRAMUSCULAR | Status: AC
  Filled 2013-11-09: qty 2

## 2013-11-09 MED ORDER — ANASTROZOLE 1 MG PO TABS
1.0000 mg | ORAL_TABLET | Freq: Every day | ORAL | Status: DC
  Administered 2013-11-10: 1 mg via ORAL
  Filled 2013-11-09 (×4): qty 1

## 2013-11-09 MED ORDER — OSELTAMIVIR PHOSPHATE 75 MG PO CAPS
75.0000 mg | ORAL_CAPSULE | Freq: Two times a day (BID) | ORAL | Status: DC
  Administered 2013-11-09 – 2013-11-10 (×3): 75 mg via ORAL
  Filled 2013-11-09 (×7): qty 1

## 2013-11-09 MED ORDER — ALBUTEROL SULFATE (2.5 MG/3ML) 0.083% IN NEBU
2.5000 mg | INHALATION_SOLUTION | RESPIRATORY_TRACT | Status: DC
  Administered 2013-11-09 (×2): 2.5 mg via RESPIRATORY_TRACT
  Filled 2013-11-09 (×2): qty 3

## 2013-11-09 MED ORDER — ARFORMOTEROL TARTRATE 15 MCG/2ML IN NEBU
15.0000 ug | INHALATION_SOLUTION | Freq: Two times a day (BID) | RESPIRATORY_TRACT | Status: DC
  Administered 2013-11-09 – 2013-11-10 (×2): 15 ug via RESPIRATORY_TRACT
  Filled 2013-11-09 (×4): qty 2

## 2013-11-09 NOTE — Progress Notes (Signed)
Came to patient's room to place PICC line and patient requested that we place PICC tomorrow because she is short of breath at this time and can't lay down. Staff Nurse at bedside aware.

## 2013-11-09 NOTE — Progress Notes (Signed)
CARE MANAGEMENT NOTE 11/09/2013  Patient:  [name], [name]   Account Number:  [id number]  Date Initiated:  11/09/2013  Documentation initiated by:  DAVIS,RHONDA  Subjective/Objective Assessment:   hx of copd excerbation at the present due to influzena A positive.     Action/Plan:   home when stable/lives alone will follow for hhc versus st snf placement or ltac   Anticipated DC Date:  11/12/2013   Anticipated DC Plan:  HOME/SELF CARE  In-house referral  NA      DC Planning Services  NA      [facility] Choice  NA   Choice offered to / List presented to:  NA   DME arranged  NA      DME agency  NA     [place] arranged  NA      [place] agency  NA   Status of service:  In process, will continue to follow Medicare Important Message given?  NA - LOS <3 / Initial given by admissions (If response is "NO", the following Medicare IM given date fields will be blank) Date Medicare IM given:   Date Additional Medicare IM given:    Discharge Disposition:    Per UR Regulation:  Reviewed for med. necessity/level of care/duration of stay  If discussed at [place] of Stay Meetings, dates discussed:    Comments:  [id number]/Rhonda Eldridge Dace, BSN, [place] [phone] Chart Reviewed for discharge and hospital needs. Discharge needs at time of review:  None present will follow for needs. Review of patient progress due on [id number].

## 2013-11-09 NOTE — Progress Notes (Signed)
[facility] Progress Note Patient Name: [name] DOB: [birth year] MRN: [id number]  Date of Service  11/09/2013   HPI/Events of Note   Calm but worsening WOB.  Saturation holding.  Daughters very concerned.  eICU Interventions  ABG now, if worsening acidosis will intubate.      YACOUB,WESAM 11/09/2013, 10:21 PM

## 2013-11-09 NOTE — Consult Note (Signed)
Name: [name] MRN: [id number] DOB: 10-05-1941    ADMISSION DATE:  11/08/2013 CONSULTATION DATE:  1/21   REFERRING MD :  Doyle Askew PRIMARY SERVICE: [facility]  CHIEF COMPLAINT:  AE COPD  BRIEF PATIENT DESCRIPTION: 73 y/o female with COPD admitted from pulmonary clinic with AE COPD and Influenza A.  SIGNIFICANT EVENTS / STUDIES:  1/20 CT neck> salivary gland swelling due to sialadenitis  LINES / TUBES:   CULTURES: 1/20 Influenza A positive  ANTIBIOTICS: 1/20 clinda >>1/21 1/21 tamiflu >>  HISTORY OF PRESENT ILLNESS:  73 y/o female with COPD admitted from pulmonary clinic with AE COPD and Influenza A.  She had cough refractory cough, dyspnea, wheeze, chest congestion and mucus production despite prednisone and a zpack.  She came to the pulmonary office on 1/20 and could barely breath.  We gave her a breathing treatment and sent her by EMS to the [facility] ED. She has been found to be flu negative even though she hasn't had fever, headache or body aches.  She had some neck swelling and tenderness under her right jaw.  This is better today.  She feels like her breathing is better but she is still coughing a lot and can't clear the mucus.  She has not had a bowel movement in nearly a week.  PAST MEDICAL HISTORY :  Past Medical History  Diagnosis Date  . Other emphysema   . Unspecified arthropathy, shoulder region   . Gastric ulcer, unspecified as acute or chronic, without mention of hemorrhage, perforation, or obstruction   . Benign paroxysmal positional vertigo   . COPD (chronic obstructive pulmonary disease)   . Diverticula of colon   . Osteopenia   . Personal history of colonic polyps 2007    tubular adenoma high grade dysplasia  . Diverticulitis   . Hyperlipemia   . Fracture of ankle, bimalleolar, right, closed   . Hypoxemia   . C. difficile diarrhea   . Varicose vein   . Asthma   . GERD (gastroesophageal reflux disease)   . Hepatitis 1989  . Arthritis     "both shoulders"     . TIA (transient ischemic attack) 04/2008; 08/21/2012    "slight droop left lower lip after 08/21/12 TIA"  . On home oxygen therapy   . Ischemic cardiomyopathy     a. EF 45-50% 2011. b. EF 35-40% by cath 09/01/12.  . Night terrors, adult     dr Leonie Man referred  for PSG in 2011, and again in 2013   . Breast cancer 04/08/13 bx    left  . Allergy   . CAD (coronary artery disease)     a. ant MI 42 tx with TPA/PTCA. b. Prior stenting procedures including RCA stent, DES to LCx 04/2011. c. [place] -> DES  to prox LAD 08/2012.   Marland Kitchen Hypertension     TAKES FOR H/O STROKE  . CVA (cerebral vascular accident)   . Stroke, acute, embolic     Nov 3614 , [place]  [facility] , was treated with TPA.   . 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 Path G1 IDC, 1.6 cm, neg margin, neg SLN    Past Surgical History  Procedure Laterality Date  . Total shoulder arthroplasty  2012    left  . Orif ankle fracture  2007    right  . Martial megeti  1980's    "bladder operation; not successful " (08/31/2012)  . Varicose vein surgery  1983; 1985    "both legs both times" (08/31/2012)  . Total shoulder replacement  12/05/2010    Dr Onnie Graham  . Abdominal hysterectomy      TAH w/BSO  . Augmentation mammaplasty  1975  . Coronary angioplasty with stent placement  2010  . Coronary angioplasty  1990    "3 times" (08/31/2012)  . Colon surgery  07/07/2011    Dr. Hassell Done  . Appendectomy  1960  . Nose surgery      1977  . Breast augmenttation    . Catarct  right      RIGHT EYE  . Breast lumpectomy with needle localization and axillary sentinel lymph node bx Left 06/02/2013    Procedure: BREAST LUMPECTOMY WITH NEEDLE LOCALIZATION AND AXILLARY SENTINEL LYMPH NODE BX;  Surgeon: Haywood Lasso, MD;  Location: [place];  Service: General;  Laterality: Left;  NEEDLE LOC BCG 7:30 NUC MED 9:30 RECONSTRUCTION TO  FOLLOW 1 HOUR added   . Breast implant removal Left 06/02/2013    Procedure: REMOVAL BREAST IMPLANTS; LEFT BREAST CAPSULECTOMY WITH PLACEMENT OF TISSUE EXPANDER AND USE OF FLEX HD;  Surgeon: Crissie Reese, MD;  Location: [place];  Service: Plastics;  Laterality: Left;   Prior to Admission medications   Medication Sig Start Date End Date Taking? Authorizing Provider  acetaminophen-codeine (TYLENOL #3) 300-30 MG per tablet Take 1 tablet by mouth every 6 (six) hours as needed for moderate pain.   Yes Historical Provider, MD  albuterol (PROVENTIL HFA;VENTOLIN HFA) 108 (90 BASE) MCG/ACT inhaler Inhale 2 puffs into the lungs every 6 (six) hours as needed. For shortness of breath 07/05/13  Yes Rigoberto Noel, MD  anastrozole (ARIMIDEX) 1 MG tablet Take 1 tablet (1 mg total) by mouth daily. 05/19/13  Yes Chauncey Cruel, MD  aspirin EC 81 MG tablet Take 81 mg by mouth every evening.    Yes Historical Provider, MD  benzonatate (TESSALON) 200 MG capsule Take 200 mg by mouth 3 (three) times daily as needed for cough.   Yes Historical Provider, MD  clonazePAM (KLONOPIN) 0.5 MG tablet Take 1 tablet (0.5 mg total) by mouth at bedtime. 07/21/13  Yes Carmen Dohmeier, MD  clopidogrel (PLAVIX) 75 MG tablet Take 75 mg by mouth daily.   Yes Historical Provider, MD  docusate sodium (COLACE) 100 MG capsule Take 100 mg by mouth daily.   Yes Historical Provider, MD  Fluticasone-Salmeterol (ADVAIR) 250-50 MCG/DOSE AEPB Inhale 1 puff into the lungs 2 (two) times daily.   Yes Historical Provider, MD  gabapentin (NEURONTIN) 300 MG capsule Take 300 mg by mouth at bedtime.   Yes Historical Provider, MD  L-Lysine 500 MG TABS Take 1 tablet by mouth daily.    Yes Historical Provider, MD  metoprolol tartrate (LOPRESSOR) 25 MG tablet Take 12.5 mg by mouth every evening.    Yes Historical Provider, MD  nitroGLYCERIN (NITROSTAT) 0.4 MG SL tablet Place 1 tablet (0.4 mg total) under the tongue every 5 (five) minutes as needed for chest pain  (up to 3 doses). 09/02/12  Yes [name] N Dunn, PA-C  oxybutynin (DITROPAN) 5 MG tablet Take 2.5 mg by mouth at bedtime.   Yes Historical Provider, MD  oxyCODONE-acetaminophen (PERCOCET/ROXICET) 5-325 MG per tablet Take 1 tablet by mouth every 8 (eight) hours as needed for severe pain.   Yes Historical Provider, MD  pantoprazole (PROTONIX) 40 MG tablet Take 40 mg  by mouth daily.   Yes Historical Provider, MD  predniSONE (DELTASONE) 10 MG tablet Take 2 tabs daily with food x 5 days, take 1 tab x 5 days then stop. 11/03/13  Yes Rakesh V Alva, MD  Probiotic Product (PROBIOTIC DAILY PO) Take 1 tablet by mouth daily.    Yes Historical Provider, MD  simvastatin (ZOCOR) 40 MG tablet Take 40 mg by mouth every evening.   Yes Historical Provider, MD  tiotropium (SPIRIVA) 18 MCG inhalation capsule Place 18 mcg into inhaler and inhale daily.   Yes Historical Provider, MD   Allergies  Allergen Reactions  . Escitalopram Oxalate Shortness Of Breath, Diarrhea and Nausea And Vomiting    Hot flashes, dizziness  . Indomethacin Other (See Comments)    "made me very very dizzy and made me have vertigo" (08/31/2012)  . Nsaids     Jerking, hot flashes, nausea  . Codeine Nausea And Vomiting    "I can take codeine in my cough medicine" (08/31/2012)  . Sulfonamide Derivatives Other (See Comments)    Drug induced hepatitis in 1989    FAMILY HISTORY:  Family History  Problem Relation Age of Onset  . Breast cancer Sister     x 2  . Colon cancer Neg Hx   . Diabetes Paternal Grandmother   . Uterine cancer Paternal Grandmother   . Lung cancer Paternal Grandmother   . Heart disease Mother   . Alcohol abuse Mother   . Heart disease Father   . Pancreatic cancer Cousin   . Colon polyps Sister   . Diabetes Maternal Uncle   . Heart disease Maternal Grandfather   . Heart disease Paternal Grandfather   . Stroke Paternal Grandfather    SOCIAL HISTORY:  reports that she quit smoking about 16 years ago. Her smoking use  included Cigarettes. She has a 40 pack-year smoking history. She has never used smokeless tobacco. She reports that she drinks about 1.8 ounces of alcohol per week. She reports that she does not use illicit drugs.  REVIEW OF SYSTEMS:   Gen: Denies fever, chills, weight change, fatigue, night sweats HEENT: Denies blurred vision, double vision, hearing loss, tinnitus, sinus congestion, rhinorrhea, sore throat, + neck stiffness, dysphagia PULM: per HPI CV: Denies chest pain, edema, orthopnea, paroxysmal nocturnal dyspnea, palpitations GI: Denies abdominal pain, nausea, vomiting, diarrhea, hematochezia, melena, +++ constipation, change in bowel habits GU: Denies dysuria, hematuria, polyuria, oliguria, urethral discharge Endocrine: Denies hot or cold intolerance, polyuria, polyphagia or appetite change Derm: Denies rash, dry skin, scaling or peeling skin change Heme: Denies easy bruising, bleeding, bleeding gums Neuro: Denies headache, numbness, weakness, slurred speech, loss of memory or consciousness   SUBJECTIVE:   VITAL SIGNS: Temp:  [98 F (36.7 C)-98.8 F (37.1 C)] 98.4 F (36.9 C) (01/21 0800) Pulse Rate:  [90-133] 99 (01/21 1000) Resp:  [18-30] 20 (01/21 1000) BP: (135-157)/(54-84) 135/54 mmHg (01/21 1000) SpO2:  [90 %-97 %] 94 % (01/21 1000) Weight:  [74.844 kg (165 lb)-74.9 kg (165 lb 2 oz)] 74.9 kg (165 lb 2 oz) (01/20 2145) HEMODYNAMICS:   VENTILATOR SETTINGS:   INTAKE / OUTPUT: Intake/Output     01/20 0701 - 01/21 0700 01/21 0701 - 01/22 0700   P.O. 240 150   I.V. (mL/kg)  10 (0.1)   IV Piggyback 100    Total Intake(mL/kg) 340 (4.5) 160 (2.1)   Net +340 +160        Urine Occurrence 4 x      PHYSICAL EXAMINATION:   General: no acute distress HEENT: NCAT, EOMi, neck swelling under R mandible improved today PULM: Minimal wheezing, diminished air movement CV: RRR, no mgr AB: BS+, soft, nontender Ext: warm, no edema Neuro: A&Ox4, maew  LABS:  CBC  Recent  Labs Lab 11/08/13 1715 11/09/13 0737  WBC 9.2 8.9  HGB 13.4 13.3  HCT 41.0 40.6  PLT 160 144*   Coag's  Recent Labs Lab 11/09/13 0737  INR 1.04   BMET  Recent Labs Lab 11/08/13 1715 11/09/13 0737  NA 138 138  K 4.2 4.1  CL 98 98  CO2 25 25  BUN 22 24*  CREATININE 0.80 0.76  GLUCOSE 94 190*   Electrolytes  Recent Labs Lab 11/08/13 1715 11/09/13 0737  CALCIUM 9.3 8.8   Sepsis Markers No results found for this basename: LATICACIDVEN, PROCALCITON, O2SATVEN,  in the last 168 hours ABG  Recent Labs Lab 11/08/13 2055  PHART 7.329*  PCO2ART 49.0*  PO2ART 116.0*   Liver Enzymes  Recent Labs Lab 11/09/13 0737  AST 34  ALT 38*  ALKPHOS 127*  BILITOT 0.3  ALBUMIN 3.4*   Cardiac Enzymes  Recent Labs Lab 11/08/13 1715  PROBNP 199.9*   Glucose No results found for this basename: GLUCAP,  in the last 168 hours  Imaging    CXR: reviewed, emphysema but no infiltrate  ASSESSMENT / PLAN:  PULMONARY A: Severe Acute Exacerbation of COPD due to influenza A; improved over night on steroids Chest congestion Acute hypoxemic respiratory failure due to COPD exacerbation P:   -wean O2 for target O2 saturation 92-94% -change duoneb to albuterol scheduled q4h -prn albuterol -continue spiriva and dulera -wean steroids to q12 -continue Guaifenesin scheduled -see ID  GASTROINTESTINAL A:  Constipation Sialadenitis P:   -hard candies for sialadenitis -add senna -continue probiotic and colace  INFECTIOUS A:  Influenza A P:   -considering this culture result and failure of antibiotics for 6 days now, will d/c antibiotics and treat with tamiflu alone  Jillyn Hidden PCCM Pager: [phone] Cell: [phone] If no response, call [phone]  11/09/2013, 10:35 AM

## 2013-11-09 NOTE — Progress Notes (Signed)
[place] Progress Note Patient Name: [name] DOB: [birth year] MRN: [id number]  Date of Service  11/09/2013   HPI/Events of Note   Respiratory distress  eICU Interventions  ABG, haldol and CXR ordered. Dr. Halford Chessman will go see patient and evaluate bedside.      YACOUB,WESAM 11/09/2013, 6:00 PM

## 2013-11-09 NOTE — Progress Notes (Signed)
Subjective: C/o not being able to catch her breath.  Very anxious, and can't lay flat.  Objective: BP 158/75  Pulse 107  Temp(Src) 98.1 F (36.7 C) (Oral)  Resp 16  Ht 5\' 4"  (1.626 m)  Wt 165 lb 2 oz (74.9 kg)  BMI 28.33 kg/m2  SpO2 98%  General: using accessory muscles HEENT: No sinus tenderness Cardiac: regular, tachycardic Chest: shallow breathing, b/l wheeze Abd: soft, non tender Ext: 1+ edema Neuro: anxious  CBC Recent Labs     11/08/13  1715  11/09/13  0737  WBC  9.2  8.9  HGB  13.4  13.3  HCT  41.0  40.6  PLT  160  144*    Coag's Recent Labs     11/09/13  0737  INR  1.04    BMET Recent Labs     11/08/13  1715  11/09/13  0737  NA  138  138  K  4.2  4.1  CL  98  98  CO2  25  25  BUN  22  24*  CREATININE  0.80  0.76  GLUCOSE  94  190*    Electrolytes Recent Labs     11/08/13  1715  11/09/13  0737  CALCIUM  9.3  8.8   ABG Recent Labs     11/08/13  2055  PHART  7.329*  PCO2ART  49.0*  PO2ART  116.0*    Liver Enzymes Recent Labs     11/09/13  0737  AST  34  ALT  38*  ALKPHOS  127*  BILITOT  0.3  ALBUMIN  3.4*    Cardiac Enzymes Recent Labs     11/08/13  1715  PROBNP  199.9*   Imaging Dg Chest 2 View (if Patient Has Fever And/or Copd)  11/08/2013   CLINICAL DATA:  Shortness of breath. History of COPD and asthma. History of left breast cancer.  EXAM: CHEST  2 VIEW  COMPARISON:  06/03/2013.  FINDINGS: Stable heart and mediastinal contours. Atherosclerotic calcification of the thoracic aortic arch. No airspace disease or pleural effusion is identified. The lungs appear emphysematous. Negative for pneumothorax. The bones are diffusely osteopenic. Left shoulder arthroplasty is noted.  IMPRESSION: Emphysema.  No acute findings identified.   Electronically Signed   By: Curlene Dolphin M.D.   On: 11/08/2013 17:31   Ct Soft Tissue Neck W Contrast  11/08/2013   CLINICAL DATA:  Evaluate right submandibular mass  EXAM: CT NECK WITH  CONTRAST  TECHNIQUE: Multidetector CT imaging of the neck was performed using the standard protocol following the bolus administration of intravenous contrast.  CONTRAST:  12mL OMNIPAQUE IOHEXOL 300 MG/ML  SOLN  COMPARISON:  Prior CT from 10/02/2011  FINDINGS: The visualized portions of the brain are unremarkable. Prominent vascular calcifications noted within the carotid siphons.  Partially visualized orbits are within normal limits.  Air-fluid level noted within the right maxillary sinus with scattered mucoperiosteal thickening within the anterior posterior ethmoidal air cells bilaterally. No mastoid effusion.  The parotid glands and left submandibular gland are normal. The right submandibular gland is enlarged and relatively hyper enhancing as compared to the contralateral left. There is adjacent inflammatory fat stranding within the right submandibular space with overlying skin thickening and induration. No obstructive calcified stones are seen within the sublingual duct. No obstructive mass lesions seen at the floor of mouth or within the right submandibular gland itself. No loculated fluid collection to suggest abscess.  The oral cavity and oropharynx are within normal  limits. There is mild asymmetric in effacement of the right parapharyngeal fat, likely related to inflammatory changes involving the right submandibular space. The nasopharynx, hypopharynx, and supraglottic larynx are normal. Vocal cords are symmetric in appearance without evidence of mass lesion or other abnormality. Evaluation of the subglottic airway is somewhat limited due to motion, but grossly normal.  The thyroid gland is grossly normal.  No pathologically enlarged cervical lymph nodes are identified within the neck. No pathologically enlarged nodes are seen within the superior mediastinum.  Severe emphysematous changes noted within the partially visualized lungs. Irregular pleural-parenchymal thickening/scarring present at the left lung  apex.  Normal intravascular enhancement seen throughout the neck.  No focal lytic or blastic osseous lesions are identified. Left shoulder arthroplasty partially visualized.  IMPRESSION: 1. Enlargement and relative hyper enhancement of the right submandibular gland with associated inflammatory fat stranding, most compatible with acute sialadenitis. No obstructive stone or mass lesion identified. No loculated fluid collection to suggest abscess. 2. Severe emphysema. 3. Mild paranasal sinus disease as above.   Electronically Signed   By: Jeannine Boga M.D.   On: 11/08/2013 20:19    Assessment/Plan:  73 yo with acute respiratory failure in setting of Influenza and AECOPD. Plan: -change to brovana/pulmicort with prn albuterol nebulizer -continue spiriva -BiPAP prn >> hopefully can avoid intubation -continue solumedrol  Updated family over the phone.  CC time 50 minutes.  Chesley Mires, MD [facility] Pulmonary/Critical Care 11/09/2013, 4:43 PM Pager:  [phone] After 3pm call: [phone]

## 2013-11-09 NOTE — Plan of Care (Signed)
Problem: Phase I Progression Outcomes Goal: Progress activity as tolerated unless otherwise ordered Outcome: Not Progressing SHOB

## 2013-11-09 NOTE — Progress Notes (Signed)
Pt did well initially on BIPAP, and was able to fall asleep.  She then woke up, and felt very anxious.  She then requested to have BiPAP taken off.  I discussed with pt again how BiPAP can be beneficial >> she is willing to try this again.  Will add precedex to assist with control of anxiety/agitation.  If she does not respond to these interventions, then she may need intubation.  ABG    Component Value Date/Time   PHART 7.329* 11/08/2013 2055   PCO2ART 49.0* 11/08/2013 2055   PO2ART 116.0* 11/08/2013 2055   HCO3 25.0* 11/08/2013 2055   TCO2 22.5 11/08/2013 2055   ACIDBASEDEF 0.9 11/08/2013 2055   O2SAT 97.7 11/08/2013 2055    Plan d/w pt's daughter at bedside.  Additional CC time 35 minutes.  Chesley Mires, MD [facility] Pulmonary/Critical Care 11/09/2013, 6:28 PM Pager:  [phone] After 3pm call: [phone]

## 2013-11-09 NOTE — Progress Notes (Signed)
Rt placed pt on servo (BIPAP) for respiratory distress per MD order.

## 2013-11-09 NOTE — Progress Notes (Addendum)
Patient ID: [name], female   DOB: December 22, 1940, 73 y.o.   MRN: [id number] [facility] PROGRESS NOTE  [name] RWE:[id number] DOB: [birth year] DOA: 11/08/2013 PCP: Jamie Cobble, MD  Brief narrative: 73 y/o female with COPD admitted from pulmonary clinic for further management of AE COPD and Influenza A. Pt reports several days duration of productive cough of yellow sputum, mostly exertional SOB, wheezing, despite treatment with Zithromax and prednisone. Pt was sent via EMS from pulmonary clinic to [facility].   Principal Problem:   COPD exacerbation - pt is clinically improving and reports feeling better - continue to keep on oxygen if needed and taper down to keep oxygen level at 92% - 94% - continue Solumedrol and taper to Q12 today - no need for ABX as pt recently treated with Z-Pack  - continue albuterol nebulizer  Active Problems:   Influenza A - started 01/21 - supportive care will be provided   Hyperglycemia  - most likely secondary to steroids - cover with SSI while inpatient on steroids    Acute sialadenitis - continuing Clindamycin day #2   Hypertension - reasonable inpatient control    Breast cancer, left, LIQ, Stage 1, receptor+, her2 neg - continue Anastrozole   Consultants:  PCCM  Procedures/Studies: Dg Chest 2 View   11/08/2013   Emphysema.  No acute findings identified.    Ct Soft Tissue Neck W Contrast    11/08/2013  Enlargement and relative hyper enhancement of the right submandibular gland with associated inflammatory fat stranding, most compatible with acute sialadenitis. No obstructive stone or mass lesion identified. No loculated fluid collection to suggest abscess. Severe emphysema.. Mild paranasal sinus disease as above.  Antibiotics/Antivirals:  Clindamycin 01/20 -->  Tamiflu 01/21 -->  Code Status: Full Family Communication: Pt at bedside Disposition Plan: Home when medically stable  HPI/Subjective: No events overnight.    Objective: Filed Vitals:   11/09/13 0018 11/09/13 0312 11/09/13 0421 11/09/13 0756  BP: 150/54  141/58   Pulse: 114  90   Temp: 98.5 F (36.9 C)  98.8 F (37.1 C)   TempSrc: Oral  Oral   Resp: 21  18   Height:      Weight:      SpO2: 90% 95% 96% 96%    Intake/Output Summary (Last 24 hours) at 11/09/13 0851 Last data filed at 11/09/13 0655  Gross per 24 hour  Intake    340 ml  Output      0 ml  Net    340 ml    Exam:   General:  Pt is alert, follows commands appropriately, not in acute distress  Cardiovascular: Regular rate and rhythm, S1/S2, no murmurs, no rubs, no gallops  Respiratory: Diminished breath sounds bilaterally with expiratory wheezing noted   Abdomen: Soft, non tender, non distended, bowel sounds present, no guarding  Extremities: No edema, pulses DP and PT palpable bilaterally  Neuro: Grossly nonfocal  Data Reviewed: Basic Metabolic Panel:  Recent Labs Lab 11/08/13 1715 11/09/13 0737  NA 138 138  K 4.2 4.1  CL 98 98  CO2 25 25  GLUCOSE 94 190*  BUN 22 24*  CREATININE 0.80 0.76  CALCIUM 9.3 8.8   Liver Function Tests:  Recent Labs Lab 11/09/13 0737  AST 34  ALT 38*  ALKPHOS 127*  BILITOT 0.3  PROT 6.9  ALBUMIN 3.4*   CBC:  Recent Labs Lab 11/08/13 1715 11/09/13 0737  WBC 9.2 8.9  HGB 13.4 13.3  HCT 41.0 40.6  MCV 87.4 86.9  PLT 160 144*    Recent Results (from the past 240 hour(s))  MRSA PCR SCREENING     Status: None   Collection Time    11/08/13 11:40 PM      Result Value Range Status   MRSA by PCR NEGATIVE  NEGATIVE Final   Comment:            The GeneXpert MRSA Assay (FDA     approved for NASAL specimens     only), is one component of a     comprehensive MRSA colonization     surveillance program. It is not     intended to diagnose MRSA     infection nor to guide or     monitor treatment for     MRSA infections.     Scheduled Meds: . anastrozole  1 mg Oral Daily  . aspirin EC  81 mg Oral QPM  .  clindamycin  IV  600 mg Intravenous Q8H  . clonazePAM  0.5 mg Oral QHS  . clopidogrel  75 mg Oral Daily  . docusate sodium  100 mg Oral BID  . enoxaparin  injection  40 mg Subcutaneous QHS  . gabapentin  300 mg Oral QHS  . guaiFENesin  600 mg Oral BID  . ipratropium-albuterol  3 mL Nebulization Q4H  . SOLU-MEDROL injection  60 mg Intravenous Q6H  . metoprolol tartrate  12.5 mg Oral BID  . mometasone-formoterol  2 puff Inhalation BID  . oxybutynin  2.5 mg Oral QHS  . pantoprazole  40 mg Oral Daily  . simvastatin  40 mg Oral QPM  . tiotropium  18 mcg Inhalation Daily   Continuous Infusions:   Jamie Ramsay, MD  [facility] Pager [phone]  If 7PM-7AM, please contact night-coverage [email or web address] Password [email or web address] 11/09/2013, 8:51 AM   LOS: 1 day

## 2013-11-10 ENCOUNTER — Ambulatory Visit: Payer: Medicare Other | Admitting: Physician Assistant

## 2013-11-10 ENCOUNTER — Inpatient Hospital Stay (HOSPITAL_COMMUNITY): Payer: Medicare Other

## 2013-11-10 LAB — BASIC METABOLIC PANEL
BUN: 29 mg/dL — AB (ref 6–23)
CHLORIDE: 101 meq/L (ref 96–112)
CO2: 26 mEq/L (ref 19–32)
Calcium: 8.8 mg/dL (ref 8.4–10.5)
Creatinine, Ser: 0.69 mg/dL (ref 0.50–1.10)
GFR calc non Af Amer: 85 mL/min — ABNORMAL LOW (ref >90)
GLUCOSE: 149 mg/dL — AB (ref 70–99)
POTASSIUM: 4.7 meq/L (ref 3.7–5.3)
Sodium: 139 mEq/L (ref 137–147)

## 2013-11-10 LAB — BLOOD GAS, ARTERIAL
Acid-Base Excess: 0.4 mmol/L (ref 0.0–2.0)
BICARBONATE: 27.4 meq/L — AB (ref 20.0–24.0)
Drawn by: 331471
FIO2: 0.4 %
MECHVT: 440 mL
O2 Saturation: 89.1 %
PATIENT TEMPERATURE: 98.6
PEEP: 5 cmH2O
PH ART: 7.304 — AB (ref 7.350–7.450)
PO2 ART: 64.1 mmHg — AB (ref 80.0–100.0)
RATE: 14 resp/min
TCO2: 24.8 mmol/L (ref 0–100)
pCO2 arterial: 56.8 mmHg — ABNORMAL HIGH (ref 35.0–45.0)

## 2013-11-10 LAB — CBC
HEMATOCRIT: 40.6 % (ref 36.0–46.0)
HEMOGLOBIN: 13.3 g/dL (ref 12.0–15.0)
MCH: 28.4 pg (ref 26.0–34.0)
MCHC: 32.8 g/dL (ref 30.0–36.0)
MCV: 86.8 fL (ref 78.0–100.0)
Platelets: 121 10*3/uL — ABNORMAL LOW (ref 150–400)
RBC: 4.68 MIL/uL (ref 3.87–5.11)
RDW: 14 % (ref 11.5–15.5)
WBC: 8.4 10*3/uL (ref 4.0–10.5)

## 2013-11-10 MED ORDER — SUCCINYLCHOLINE CHLORIDE 20 MG/ML IJ SOLN
INTRAMUSCULAR | Status: AC
  Filled 2013-11-10: qty 1

## 2013-11-10 MED ORDER — VITAL AF 1.2 CAL PO LIQD
1000.0000 mL | ORAL | Status: DC
  Administered 2013-11-10 – 2013-11-14 (×5): 1000 mL
  Filled 2013-11-10 (×8): qty 1000

## 2013-11-10 MED ORDER — MIDAZOLAM HCL 2 MG/2ML IJ SOLN
INTRAMUSCULAR | Status: AC
  Filled 2013-11-10: qty 4

## 2013-11-10 MED ORDER — VITAL AF 1.2 CAL PO LIQD
1000.0000 mL | ORAL | Status: DC
  Filled 2013-11-10: qty 1000

## 2013-11-10 MED ORDER — ETOMIDATE 2 MG/ML IV SOLN
20.0000 mg | Freq: Once | INTRAVENOUS | Status: AC
  Administered 2013-11-10: 20 mg via INTRAVENOUS
  Filled 2013-11-10: qty 10

## 2013-11-10 MED ORDER — FENTANYL CITRATE 0.05 MG/ML IJ SOLN
100.0000 ug | Freq: Once | INTRAMUSCULAR | Status: AC
  Administered 2013-11-10: 100 ug via INTRAVENOUS

## 2013-11-10 MED ORDER — MIDAZOLAM HCL 2 MG/2ML IJ SOLN
4.0000 mg | Freq: Once | INTRAMUSCULAR | Status: AC
  Administered 2013-11-10: 4 mg via INTRAVENOUS

## 2013-11-10 MED ORDER — VANCOMYCIN HCL IN DEXTROSE 1-5 GM/200ML-% IV SOLN
1000.0000 mg | Freq: Two times a day (BID) | INTRAVENOUS | Status: DC
  Administered 2013-11-10 – 2013-11-13 (×6): 1000 mg via INTRAVENOUS
  Filled 2013-11-10 (×7): qty 200

## 2013-11-10 MED ORDER — ETOMIDATE 2 MG/ML IV SOLN
INTRAVENOUS | Status: AC
  Filled 2013-11-10: qty 20

## 2013-11-10 MED ORDER — ROCURONIUM BROMIDE 50 MG/5ML IV SOLN
INTRAVENOUS | Status: AC
  Filled 2013-11-10: qty 2

## 2013-11-10 MED ORDER — LIDOCAINE HCL (CARDIAC) 20 MG/ML IV SOLN
INTRAVENOUS | Status: AC
  Filled 2013-11-10: qty 5

## 2013-11-10 MED ORDER — ROCURONIUM BROMIDE 50 MG/5ML IV SOLN
70.0000 mg | Freq: Once | INTRAVENOUS | Status: AC
  Administered 2013-11-10: 70 mg via INTRAVENOUS
  Filled 2013-11-10: qty 7

## 2013-11-10 MED ORDER — FENTANYL CITRATE 0.05 MG/ML IJ SOLN
50.0000 ug | INTRAMUSCULAR | Status: DC | PRN
  Administered 2013-11-10 – 2013-11-12 (×12): 50 ug via INTRAVENOUS
  Filled 2013-11-10 (×13): qty 2

## 2013-11-10 MED ORDER — PIPERACILLIN-TAZOBACTAM 3.375 G IVPB
3.3750 g | Freq: Three times a day (TID) | INTRAVENOUS | Status: DC
  Administered 2013-11-10 – 2013-11-13 (×10): 3.375 g via INTRAVENOUS
  Filled 2013-11-10 (×10): qty 50

## 2013-11-10 MED ORDER — FENTANYL CITRATE 0.05 MG/ML IJ SOLN
INTRAMUSCULAR | Status: AC
  Filled 2013-11-10: qty 2

## 2013-11-10 NOTE — Progress Notes (Signed)
ANTIBIOTIC CONSULT NOTE - INITIAL  Pharmacy Consult for Vancomycin/Zosyn Indication: pneumonia  Allergies  Allergen Reactions  . Escitalopram Oxalate Shortness Of Breath, Diarrhea and Nausea And Vomiting    Hot flashes, dizziness  . Indomethacin Other (See Comments)    "made me very very dizzy and made me have vertigo" (08/31/2012)  . Nsaids     Jerking, hot flashes, nausea  . Codeine Nausea And Vomiting    "I can take codeine in my cough medicine" (08/31/2012)  . Sulfonamide Derivatives Other (See Comments)    Drug induced hepatitis in 1989    Patient Measurements: Height: 5' 4"  (162.6 cm) Weight: 166 lb 10.7 oz (75.6 kg) IBW/kg (Calculated) : 54.7  Vital Signs: BP: 147/87 mmHg (01/22 0600) Pulse Rate: 86 (01/22 0600) Intake/Output from previous day: 01/21 0701 - 01/22 0700 In: 365.4 [P.O.:250; I.V.:115.4] Out: 400 [Urine:400] Intake/Output from this shift:    Labs:  Recent Labs  11/08/13 1715 11/09/13 0737 11/10/13 0400  WBC 9.2 8.9 8.4  HGB 13.4 13.3 13.3  PLT 160 144* 121*  CREATININE 0.80 0.76 0.69   Estimated Creatinine Clearance: 63.3 ml/min (by C-G formula based on Cr of 0.69). No results found for this basename: VANCOTROUGH, Corlis Leak, VANCORANDOM, [place], GENTPEAK, GENTRANDOM, TOBRATROUGH, TOBRAPEAK, TOBRARND, AMIKACINPEAK, AMIKACINTROU, AMIKACIN,  in the last 72 hours   Microbiology: Recent Results (from the past 720 hour(s))  MRSA PCR SCREENING     Status: None   Collection Time    11/08/13 11:40 PM      Result Value Range Status   MRSA by PCR NEGATIVE  NEGATIVE Final   Comment:            The GeneXpert MRSA Assay (FDA     approved for NASAL specimens     only), is one component of a     comprehensive MRSA colonization     surveillance program. It is not     intended to diagnose MRSA     infection nor to guide or     monitor treatment for     MRSA infections.    Medical History: Past Medical History  Diagnosis Date  . Other  emphysema   . Unspecified arthropathy, shoulder region   . Gastric ulcer, unspecified as acute or chronic, without mention of hemorrhage, perforation, or obstruction   . Benign paroxysmal positional vertigo   . COPD (chronic obstructive pulmonary disease)   . Diverticula of colon   . Osteopenia   . Personal history of colonic polyps 2007    tubular adenoma high grade dysplasia  . Diverticulitis   . Hyperlipemia   . Fracture of ankle, bimalleolar, right, closed   . Hypoxemia   . C. difficile diarrhea   . Varicose vein   . Asthma   . GERD (gastroesophageal reflux disease)   . Hepatitis 1989  . Arthritis     "both shoulders"   . TIA (transient ischemic attack) 04/2008; 08/21/2012    "slight droop left lower lip after 08/21/12 TIA"  . On home oxygen therapy   . Ischemic cardiomyopathy     a. EF 45-50% 2011. b. EF 35-40% by cath 09/01/12.  . Night terrors, adult     dr Leonie Man referred  for PSG in 2011, and again in 2013   . Breast cancer 04/08/13 bx    left  . Allergy   . CAD (coronary artery disease)     a. ant MI 61 tx with TPA/PTCA. b. Prior stenting procedures including RCA stent,  DES to LCx 04/2011. c. [place] -> DES  to prox LAD 08/2012.   Marland Kitchen Hypertension     TAKES FOR H/O STROKE  . CVA (cerebral vascular accident)   . Stroke, acute, embolic     Nov 8280 , [place]  [facility] , was treated with TPA.   . 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 Path G1 IDC, 1.6 cm, neg margin, neg SLN      Assessment: 80 yoF with acute respiratory failure in setting of influenza and AECOPD.  Patient was treated with zpack and prednisone PTA but respiratory symptoms progressed and patient was admitted 1/20 PM.  Clindamycin and Tamiflu started on admission.  1/21 PM CXR shows suspicion of developing left-sided airspace disease.  Beginning Vancomycin and Zosyn for pneumonia.  1/21  Clindamycin x 1 1/21 Tamiflu >> (x 5 days) 1/22 Zosyn >> 1/22 Vancomycin >>   Tmax24h: 99.6  WBC: WNL  Renal: SCr 0.69, CrCl~71 ml/min/1.65m (normalized, SCr adjusted to 0.8), ~74 ml/min (CG)  Blood cultures ordered  Legionella and Strep pneumo urinary antigens negative  Influenza panel POSITIVE for influenza A   Goal of Therapy:  Vancomycin trough level 15-20 mcg/ml  Plan:  1.  Zosyn 3.375g IV q8h (4 hour infusion time). 2.  Vancomycin 1g IV q12h. 3.  F/u SCr, trough level, culture results, clinical course.   RHershal Coria1/22/2015,9:32 AM

## 2013-11-10 NOTE — Procedures (Signed)
Central Venous Catheter Insertion Procedure Note [name] [id number] 12/21/1940  Procedure: Insertion of Central Venous Catheter Indications: Assessment of intravascular volume and Drug and/or fluid administration  Procedure Details Consent: Risks of procedure as well as the alternatives and risks of each were explained to the (patient/caregiver).  Consent for procedure obtained. Time Out: Verified patient identification, verified procedure, site/side was marked, verified correct patient position, special equipment/implants available, medications/allergies/relevent history reviewed, required imaging and test results available.  Performed  Maximum sterile technique was used including antiseptics, cap, gloves, gown, hand hygiene, mask and sheet. Skin prep: Chlorhexidine; local anesthetic administered A antimicrobial bonded/coated triple lumen catheter was placed in the left internal jugular vein using the Seldinger technique.  Ultrasound was used to verify the patency of the vein and for real time needle guidance.  Evaluation Blood flow good Complications: No apparent complications Patient did tolerate procedure well. Chest X-ray ordered to verify placement.  CXR: pending.  MCQUAID, DOUGLAS 11/10/2013, 12:13 PM

## 2013-11-10 NOTE — Progress Notes (Signed)
[facility] PCCM  Progressive respiratory failure Family updated at bedside  Exam:  Marked increased work of breathing on vent  Intubated  Impression: 1) Severe CAP vs HCAP 2) Influenza pneumonia  Plan: 1) intubate 2) Full vent support 3) resp culture 4) CVL 5) sedation/PAS protocol  Additional CC time 32 minutes  Jillyn Hidden PCCM Pager: [phone] Cell: [phone] If no response, call [phone]

## 2013-11-10 NOTE — Progress Notes (Signed)
Patient very tachypnic with very labored breathing on Bipap. Patient still highly anxious. Precedex gtt still infusing.  CCM MD Mcquaid aware of situation. Currently in PT room. Will continue to monitor breath for worsening which per CCM MD will result in intubation. Family updated of situation by CCM MD Mcquaid.

## 2013-11-10 NOTE — Progress Notes (Signed)
Patient ID: [name], female   DOB: 08/13/1941, 73 y.o.   MRN: [id number] [facility] PROGRESS NOTE  [name] BVQ:[id number] DOB: 1941-10-03 DOA: 11/08/2013 PCP: Unice Cobble, MD  Brief narrative: 73 y/o female with COPD admitted from pulmonary clinic for further management of AE COPD and Influenza A. Pt reports several days duration of productive cough of yellow sputum, mostly exertional SOB, wheezing, despite treatment with Zithromax and prednisone. Pt was sent via EMS from pulmonary clinic to [facility].   Events: 01/21 --> pt with progressive dyspnea requiring BiPAP  Principal Problem:  COPD exacerbation  - pt currently on BiPAP, opens eyes but follows some commands, in mild distress to to SOB  - continue to keep on BIPA - continue Solumedrol, management per PCCM ADDENDUM: pt required intubation due to increased work of breathing, will transfer service to PCCM  Active Problems:  Influenza A  - started Tamiflu 01/21  - supportive care provided  Hyperglycemia  - most likely secondary to steroids  - cover with SSI while inpatient on steroids  Acute sialadenitis  - continuing Clindamycin day #3 Hypertension  - reasonable inpatient control  Breast cancer, left, LIQ, Stage 1, receptor+, her2 neg  - continue Anastrozole   Consultants:  PCCM Procedures/Studies:  Dg Chest 2 View 11/08/2013 Emphysema. No acute findings identified.  Ct Soft Tissue Neck W Contrast 11/08/2013 Enlargement and relative hyper enhancement of the right submandibular gland with associated inflammatory fat stranding, most compatible with acute sialadenitis. No obstructive stone or mass lesion identified. No loculated fluid collection to suggest abscess. Severe emphysema.. Mild paranasal sinus disease as above. Dg Chest Port 1 View   11/09/2013 COPD/chronic bronchitis.  Suspicion of developing left-sided airspace disease. Antibiotics/Antivirals:  Clindamycin 01/20 -->  Tamiflu 01/21 -->  Code Status:  Full  Family Communication: Pt at bedside, family over the phone   Disposition Plan: Keep in ICU  HPI/Subjective: Pt with increased work of breathing. Currently on BiPAP  Objective: Filed Vitals:   11/10/13 0100 11/10/13 0200 11/10/13 0300 11/10/13 0327  BP: 170/78 119/42 123/59 123/59  Pulse: 79 69 67 68  Temp:      TempSrc:      Resp: 23 20 20 20   Height:      Weight:      SpO2: 93% 96% 94% 94%    Intake/Output Summary (Last 24 hours) at 11/10/13 0522 Last data filed at 11/10/13 0200  Gross per 24 hour  Intake 392.68 ml  Output    400 ml  Net  -7.32 ml    Exam:   General:  Pt is somnolent, follows some commands appropriately, in mild distress due to SOB  Cardiovascular: Regular rate and rhythm, S1/S2, no murmurs, no rubs, no gallops  Respiratory: Course breath sounds with bilateral rhonchi, end expiratory wheezing   Abdomen: Soft, non tender, non distended, bowel sounds present, no guarding  Extremities: No edema, pulses DP and PT palpable bilaterally  Data Reviewed: Basic Metabolic Panel:  Recent Labs Lab 11/08/13 1715 11/09/13 0737 11/10/13 0400  NA 138 138 139  K 4.2 4.1 4.7  CL 98 98 101  CO2 25 25 26   GLUCOSE 94 190* 149*  BUN 22 24* 29*  CREATININE 0.80 0.76 0.69  CALCIUM 9.3 8.8 8.8   Liver Function Tests:  Recent Labs Lab 11/09/13 0737  AST 34  ALT 38*  ALKPHOS 127*  BILITOT 0.3  PROT 6.9  ALBUMIN 3.4*   CBC:  Recent Labs Lab 11/08/13 1715 11/09/13  1031 11/10/13 0400  WBC 9.2 8.9 8.4  HGB 13.4 13.3 13.3  HCT 41.0 40.6 40.6  MCV 87.4 86.9 86.8  PLT 160 144* 121*   Recent Results (from the past 240 hour(s))  MRSA PCR SCREENING     Status: None   Collection Time    11/08/13 11:40 PM      Result Value Range Status   MRSA by PCR NEGATIVE  NEGATIVE Final   Comment:            The GeneXpert MRSA Assay (FDA     approved for NASAL specimens     only), is one component of a     comprehensive MRSA colonization      surveillance program. It is not     intended to diagnose MRSA     infection nor to guide or     monitor treatment for     MRSA infections.     Scheduled Meds: . anastrozole  1 mg Oral QHS  . antiseptic oral rinse  15 mL Mouth Rinse q12n4p  . arformoterol  15 mcg Nebulization BID  . aspirin EC  81 mg Oral QPM  . budesonide (PULMICORT) nebulizer solution  0.5 mg Nebulization BID  . chlorhexidine  15 mL Mouth Rinse BID  . chlorpheniramine-HYDROcodone  5 mL Oral Q12H  . clonazePAM  0.5 mg Oral QHS  . clopidogrel  75 mg Oral Daily  . docusate sodium  100 mg Oral BID  . enoxaparin (LOVENOX) injection  40 mg Subcutaneous QHS  . gabapentin  300 mg Oral QHS  . guaiFENesin  600 mg Oral BID  . methylPREDNISolone (SOLU-MEDROL) injection  60 mg Intravenous Q12H  . metoprolol tartrate  12.5 mg Oral BID  . oseltamivir  75 mg Oral BID  . oxybutynin  2.5 mg Oral QHS  . pantoprazole  40 mg Oral Daily  . polyethylene glycol  17 g Oral Daily  . senna-docusate  1 tablet Oral BID  . simvastatin  40 mg Oral QPM  . sodium chloride  3 mL Intravenous Q12H  . tiotropium  18 mcg Inhalation Daily   Continuous Infusions: . dexmedetomidine 0.2 mcg/kg/hr (11/10/13 2811)   Faye Ramsay, MD  [facility] Pager [phone]  If 7PM-7AM, please contact night-coverage [email or web address] Password [email or web address] 11/10/2013, 5:22 AM   LOS: 2 days

## 2013-11-10 NOTE — Procedures (Signed)
Intubation Procedure Note FAYOLA MECKES [id number] 12-21-1940  Procedure: Intubation Indications: Respiratory insufficiency  Procedure Details Consent: Risks of procedure as well as the alternatives and risks of each were explained to the (patient/caregiver).  Consent for procedure obtained. Time Out: Verified patient identification, verified procedure, site/side was marked, verified correct patient position, special equipment/implants available, medications/allergies/relevent history reviewed, required imaging and test results available.  Performed  Drugs Etomidate 20mg , Rocuronium 70mg , Versed 4mg , Fentanyl 114mcg DL x 1 with MAC 3 blade Grade 2 view 7.5 ET tube passed through cords under direct visualization Placement confirmed with bilateral breath sounds, positive EtCO2 change and smoke in tube   Evaluation Hemodynamic Status: BP stable throughout; O2 sats: stable throughout Patient's Current Condition: stable Complications: No apparent complications Patient did tolerate procedure well. Chest X-ray ordered to verify placement.  CXR: pending.   Simonne Maffucci 11/10/2013

## 2013-11-10 NOTE — Progress Notes (Addendum)
[facility] PCCM   Name: [name] MRN: [id number] DOB: [birth year]    ADMISSION DATE:  11/08/2013 CONSULTATION DATE:  1/21   REFERRING MD :  Doyle Askew PRIMARY SERVICE: [facility]  CHIEF COMPLAINT:  AE COPD  BRIEF PATIENT DESCRIPTION: 73 y/o female with COPD admitted from pulmonary clinic with AE COPD and Influenza A.  SIGNIFICANT EVENTS / STUDIES:  1/20 CT neck> salivary gland swelling due to sialadenitis 1/21 increased WOB, needing BIPAP  LINES / TUBES:   CULTURES: 1/20 Influenza A positive 1/22 blood >>  ANTIBIOTICS: 1/20 clinda >>1/21 1/21 tamiflu >> 1/22 vanc >> 1/22 zosyn >>   SUBJECTIVE: Increased work of breathing on BIPAP  VITAL SIGNS: Temp:  [97.8 F (36.6 C)-99.6 F (37.6 C)] 99.6 F (37.6 C) (01/21 2000) Pulse Rate:  [54-128] 86 (01/22 0600) Resp:  [16-36] 26 (01/22 0600) BP: (102-170)/(28-115) 147/87 mmHg (01/22 0600) SpO2:  [73 %-99 %] 96 % (01/22 0817) FiO2 (%):  [40 %-100 %] 40 % (01/22 0818) Weight:  [75.6 kg (166 lb 10.7 oz)] 75.6 kg (166 lb 10.7 oz) (01/22 0524) HEMODYNAMICS:   VENTILATOR SETTINGS: Vent Mode:  [-] BIPAP FiO2 (%):  [40 %-100 %] 40 % Set Rate:  [15 bmp] 15 bmp PEEP:  [8 cmH20] 8 cmH20 INTAKE / OUTPUT: Intake/Output     01/21 0701 - 01/22 0700 01/22 0701 - 01/23 0700   P.O. 250    I.V. (mL/kg) 115.4 (1.5)    IV Piggyback     Total Intake(mL/kg) 365.4 (4.8)    Urine (mL/kg/hr) 400 (0.2)    Total Output 400     Net -34.6          Urine Occurrence 4 x      PHYSICAL EXAMINATION: General: increased work of breathing on BIPAP HEENT: NCAT, BIPAP mask in place PULM: increased work of breathing, diminished air movement CV: RRR, no mgr AB: BS+, soft, nontender Ext: warm, no edema Neuro: sedate on precedex, on BIPAP  LABS:  CBC  Recent Labs Lab 11/08/13 1715 11/09/13 0737 11/10/13 0400  WBC 9.2 8.9 8.4  HGB 13.4 13.3 13.3  HCT 41.0 40.6 40.6  PLT 160 144* 121*   Coag's  Recent Labs Lab 11/09/13 0737  INR 1.04    BMET  Recent Labs Lab 11/08/13 1715 11/09/13 0737 11/10/13 0400  NA 138 138 139  K 4.2 4.1 4.7  CL 98 98 101  CO2 25 25 26   BUN 22 24* 29*  CREATININE 0.80 0.76 0.69  GLUCOSE 94 190* 149*   Electrolytes  Recent Labs Lab 11/08/13 1715 11/09/13 0737 11/10/13 0400  CALCIUM 9.3 8.8 8.8   Sepsis Markers No results found for this basename: LATICACIDVEN, PROCALCITON, O2SATVEN,  in the last 168 hours ABG  Recent Labs Lab 11/08/13 2055 11/09/13 2230  PHART 7.329* 7.379  PCO2ART 49.0* 46.7*  PO2ART 116.0* 125.0*   Liver Enzymes  Recent Labs Lab 11/09/13 0737  AST 34  ALT 38*  ALKPHOS 127*  BILITOT 0.3  ALBUMIN 3.4*   Cardiac Enzymes  Recent Labs Lab 11/08/13 1715  PROBNP 199.9*   Glucose No results found for this basename: GLUCAP,  in the last 168 hours  Imaging   CXR: reviewed, emphysema and worsening infiltrate on left  ASSESSMENT / PLAN:  PULMONARY A:  Severe Acute Exacerbation of COPD due to influenza A> worsening work of breath HCAP with worsening CXR findings Acute hypoxemic respiratory failure due to COPD exacerbation P:   -intubate today -albuterol scheduled q4h -prn  albuterol -continue spiriva and dulera -maintain steroids to q12 -continue Guaifenesin scheduled -see ID  Cardiovascular: A: Hemodynamically stable Hypertension P: -monitor BP post intubation -may need CVL for meds, sedation  GASTROINTESTINAL A:  Constipation Sialadenitis P:   -monitor neck exam -add senna -continue probiotic and colace  INFECTIOUS A:  Influenza A HCAP P:   -tamiflu -add vanc, zosyn -blood culture today  Neuro: A: Anxiety/agitation P: -will need intubation today -precedex drip for now  Daughter updated by phone  CC time 40 minutes  Jillyn Hidden PCCM Pager: [phone] Cell: [phone] If no response, call [phone]  11/10/2013, 9:20 AM

## 2013-11-10 NOTE — Progress Notes (Signed)
INITIAL NUTRITION ASSESSMENT  DOCUMENTATION CODES Per approved criteria  -Not Applicable   INTERVENTION: - Start TF via OGT of Vital AF 1.2 at 47m/hr increase by 168mevery 4 hours to goal of 5077mr. Goal rate will provide 1440 calories, 90g protein, and 973m49mee water and meet 99% estimated calorie needs and 100% estimated protein needs - Continue adult enteral protocol - Will continue to monitor   NUTRITION DIAGNOSIS: Inadequate oral intake related to inability to eat as evidenced by NPO, mechanical ventilation.   Goal: TF to meet >90% of estimated nutritional needs  Monitor:  Weights, labs, TF tolerance/advancement, vent status  Reason for Assessment: Ventilated pt, consult for TF management  72 y66. female  Admitting Dx: COPD exacerbation  ASSESSMENT: Admitted with shortness of breath going on since Wednseday, found to have COPD exacerbation. Hx of COPD, gastric ulcer, diverticulitis, hyperlipidemia, GERD, TIA, HTN, CAD, CVA, and breast CA. Also with sialadenitis, constipation, and influenza A. Pt with progressive respiratory failure requiring intubation today. Pt alone in room.   Alk phos and ALT elevated   Patient is currently intubated on ventilator support.  MV: 6.2 L/min Temp (24hrs), Avg:98.1 F (36.7 C), Min:97 F (36.1 C), Max:99.6 F (37.6 C)  Propofol: off    Height: Ht Readings from Last 1 Encounters:  11/08/13 _0  (1.626 m)    Weight: Wt Readings from Last 1 Encounters:  11/10/13 166 lb 10.7 oz (75.6 kg)    Ideal Body Weight: 120 lb   % Ideal Body Weight: 138%  Wt Readings from Last 10 Encounters:  11/10/13 166 lb 10.7 oz (75.6 kg)  11/08/13 165 lb (74.844 kg)  10/03/13 167 lb (75.751 kg)  08/31/13 162 lb 1.9 oz (73.537 kg)  08/30/13 163 lb (73.936 kg)  07/12/13 164 lb (74.39 kg)  07/07/13 166 lb 9.6 oz (75.569 kg)  07/06/13 167 lb (75.751 kg)  06/02/13 177 lb 3.2 oz (80.377 kg)  06/02/13 177 lb 3.2 oz (80.377 kg)    Usual Body  Weight: 167 lb last month  % Usual Body Weight: 99%  BMI:  Body mass index is 28.59 kg/(m^2).  Estimated Nutritional Needs: Kcal: 14598032tein: 75-90g Fluid: >1.4L/day  Skin: +1 generalized edema  Diet Order: NPO  EDUCATION NEEDS: -No education needs identified at this time   Intake/Output Summary (Last 24 hours) at 11/10/13 1232 Last data filed at 11/10/13 1000  Gross per 24 hour  Intake 182.96 ml  Output    400 ml  Net -217.04 ml    Last BM: 1 week ago per pt   Labs:   Recent Labs Lab 11/08/13 1715 11/09/13 0737 11/10/13 0400  NA 138 138 139  K 4.2 4.1 4.7  CL 98 98 101  CO2 _1 BUN 22 24* 29*  CREATININE 0.80 0.76 0.69  CALCIUM 9.3 8.8 8.8  GLUCOSE 94 190* 149*    CBG (last 3)  No results found for this basename: GLUCAP,  in the last 72 hours  Scheduled Meds: . anastrozole  1 mg Oral QHS  . antiseptic oral rinse  15 mL Mouth Rinse q12n4p  . aspirin EC  81 mg Oral QPM  . chlorhexidine  15 mL Mouth Rinse BID  . chlorpheniramine-HYDROcodone  5 mL Oral Q12H  . clopidogrel  75 mg Oral Daily  . docusate sodium  100 mg Oral BID  . enoxaparin (LOVENOX) injection  40 mg Subcutaneous QHS  . etomidate      . etomidate  20  mg Intravenous Once  . fentaNYL      . fentaNYL  100 mcg Intravenous Once  . gabapentin  300 mg Oral QHS  . guaiFENesin  600 mg Oral BID  . lidocaine (cardiac) 100 mg/62m      . methylPREDNISolone (SOLU-MEDROL) injection  60 mg Intravenous Q12H  . metoprolol tartrate  12.5 mg Oral BID  . midazolam      . midazolam  4 mg Intravenous Once  . oseltamivir  75 mg Oral BID  . oxybutynin  2.5 mg Oral QHS  . pantoprazole  40 mg Oral Daily  . piperacillin-tazobactam (ZOSYN)  IV  3.375 g Intravenous Q8H  . polyethylene glycol  17 g Oral Daily  . rocuronium      . rocuronium  70 mg Intravenous Once  . senna-docusate  1 tablet Oral BID  . simvastatin  40 mg Oral QPM  . sodium chloride  3 mL Intravenous Q12H  . succinylcholine      .  tiotropium  18 mcg Inhalation Daily  . vancomycin  1,000 mg Intravenous Q12H    Continuous Infusions: . dexmedetomidine 0.5 mcg/kg/hr (11/10/13 0900)  . feeding supplement (VITAL AF 1.2 CAL)      Past Medical History  Diagnosis Date  . Other emphysema   . Unspecified arthropathy, shoulder region   . Gastric ulcer, unspecified as acute or chronic, without mention of hemorrhage, perforation, or obstruction   . Benign paroxysmal positional vertigo   . COPD (chronic obstructive pulmonary disease)   . Diverticula of colon   . Osteopenia   . Personal history of colonic polyps 2007    tubular adenoma high grade dysplasia  . Diverticulitis   . Hyperlipemia   . Fracture of ankle, bimalleolar, right, closed   . Hypoxemia   . C. difficile diarrhea   . Varicose vein   . Asthma   . GERD (gastroesophageal reflux disease)   . Hepatitis 1989  . Arthritis     "both shoulders"   . TIA (transient ischemic attack) 04/2008; 08/21/2012    "slight droop left lower lip after 08/21/12 TIA"  . On home oxygen therapy   . Ischemic cardiomyopathy     a. EF 45-50% 2011. b. EF 35-40% by cath 09/01/12.  . Night terrors, adult     dr sLeonie Manreferred  for PSG in 2011, and again in 2013   . Breast cancer 04/08/13 bx    left  . Allergy   . CAD (coronary artery disease)     a. ant MI 129tx with TPA/PTCA. b. Prior stenting procedures including RCA stent, DES to LCx 04/2011. c. [place]  to prox LAD 08/2012.   .Marland KitchenHypertension     TAKES FOR H/O STROKE  . CVA (cerebral vascular accident)   . Stroke, acute, embolic     Nov [address]185, [place]  [facility] , was treated with TPA.   . 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 Path G1 IDC, 1.6 cm, neg margin, neg SLN     Past Surgical History  Procedure Laterality Date  . Total shoulder arthroplasty  2012    left  . Orif ankle fracture  2007     right  . Martial megeti  16314'H   "bladder operation; not successful " (08/31/2012)  . Varicose vein surgery  1983; 1985    "  both legs both times" (08/31/2012)  . Total shoulder replacement  12/05/2010    Dr Onnie Graham  . Abdominal hysterectomy      TAH w/BSO  . Augmentation mammaplasty  1975  . Coronary angioplasty with stent placement  2010  . Coronary angioplasty  1990    "3 times" (08/31/2012)  . Colon surgery  07/07/2011    Dr. Hassell Done  . Appendectomy  1960  . Nose surgery      1977  . Breast augmenttation    . Catarct  right      RIGHT EYE  . Breast lumpectomy with needle localization and axillary sentinel lymph node bx Left 06/02/2013    Procedure: BREAST LUMPECTOMY WITH NEEDLE LOCALIZATION AND AXILLARY SENTINEL LYMPH NODE BX;  Surgeon: Haywood Lasso, MD;  Location: [facility];  Service: General;  Laterality: Left;  NEEDLE LOC BCG 7:30 NUC MED 9:30 RECONSTRUCTION TO FOLLOW 1 HOUR added   . Breast implant removal Left 06/02/2013    Procedure: REMOVAL BREAST IMPLANTS; LEFT BREAST CAPSULECTOMY WITH PLACEMENT OF TISSUE EXPANDER AND USE OF FLEX HD;  Surgeon: Crissie Reese, MD;  Location: [place];  Service: Plastics;  Laterality: Left;    Mikey College MS, [place], [place] Pager [phone] After Hours Pager

## 2013-11-10 NOTE — Progress Notes (Signed)
Due to worsening SOB and tachypnea PT will be intubated. Family at bedside aware of situation and request to be present for procedures. Respiratory, Nurse and CCM Mcquaid all present at bedside. Suction set up in room. Pt premedicated with RSI kit. See MAR. Oral care done prior to intubation. Patient intubated successfully. OG tube, foley and CVC all to be placed on patient.  Consent signed by family member. STAT CT ordered. Will continue to monitor.

## 2013-11-11 ENCOUNTER — Inpatient Hospital Stay (HOSPITAL_COMMUNITY): Payer: Medicare Other

## 2013-11-11 DIAGNOSIS — J4489 Other specified chronic obstructive pulmonary disease: Secondary | ICD-10-CM

## 2013-11-11 DIAGNOSIS — J449 Chronic obstructive pulmonary disease, unspecified: Secondary | ICD-10-CM

## 2013-11-11 LAB — BLOOD GAS, ARTERIAL
ACID-BASE EXCESS: 0.9 mmol/L (ref 0.0–2.0)
BICARBONATE: 26.2 meq/L — AB (ref 20.0–24.0)
FIO2: 0.4 %
MECHVT: 440 mL
O2 SAT: 97.7 %
PCO2 ART: 47 mmHg — AB (ref 35.0–45.0)
PEEP: 5 cmH2O
PO2 ART: 114 mmHg — AB (ref 80.0–100.0)
Patient temperature: 98.6
RATE: 14 resp/min
TCO2: 23.5 mmol/L (ref 0–100)
pH, Arterial: 7.365 (ref 7.350–7.450)

## 2013-11-11 LAB — BASIC METABOLIC PANEL
BUN: 30 mg/dL — ABNORMAL HIGH (ref 6–23)
CALCIUM: 8.5 mg/dL (ref 8.4–10.5)
CO2: 27 meq/L (ref 19–32)
CREATININE: 0.6 mg/dL (ref 0.50–1.10)
Chloride: 100 mEq/L (ref 96–112)
GFR calc non Af Amer: 89 mL/min — ABNORMAL LOW (ref >90)
Glucose, Bld: 195 mg/dL — ABNORMAL HIGH (ref 70–99)
Potassium: 4.3 mEq/L (ref 3.7–5.3)
Sodium: 137 mEq/L (ref 137–147)

## 2013-11-11 LAB — CBC WITH DIFFERENTIAL/PLATELET
Basophils Absolute: 0 10*3/uL (ref 0.0–0.1)
Basophils Relative: 0 % (ref 0–1)
EOS ABS: 0 10*3/uL (ref 0.0–0.7)
EOS PCT: 0 % (ref 0–5)
HEMATOCRIT: 40.1 % (ref 36.0–46.0)
HEMOGLOBIN: 13.1 g/dL (ref 12.0–15.0)
LYMPHS ABS: 0.8 10*3/uL (ref 0.7–4.0)
Lymphocytes Relative: 10 % — ABNORMAL LOW (ref 12–46)
MCH: 28.7 pg (ref 26.0–34.0)
MCHC: 32.7 g/dL (ref 30.0–36.0)
MCV: 87.9 fL (ref 78.0–100.0)
MONOS PCT: 9 % (ref 3–12)
Monocytes Absolute: 0.7 10*3/uL (ref 0.1–1.0)
Neutro Abs: 6.1 10*3/uL (ref 1.7–7.7)
Neutrophils Relative %: 81 % — ABNORMAL HIGH (ref 43–77)
Platelets: 127 10*3/uL — ABNORMAL LOW (ref 150–400)
RBC: 4.56 MIL/uL (ref 3.87–5.11)
RDW: 14.1 % (ref 11.5–15.5)
WBC: 7.6 10*3/uL (ref 4.0–10.5)

## 2013-11-11 LAB — CBC
HCT: 40.5 % (ref 36.0–46.0)
Hemoglobin: 13 g/dL (ref 12.0–15.0)
MCH: 28.3 pg (ref 26.0–34.0)
MCHC: 32.1 g/dL (ref 30.0–36.0)
MCV: 88.2 fL (ref 78.0–100.0)
PLATELETS: 145 10*3/uL — AB (ref 150–400)
RBC: 4.59 MIL/uL (ref 3.87–5.11)
RDW: 14 % (ref 11.5–15.5)
WBC: 6.4 10*3/uL (ref 4.0–10.5)

## 2013-11-11 LAB — GLUCOSE, CAPILLARY
GLUCOSE-CAPILLARY: 139 mg/dL — AB (ref 70–99)
Glucose-Capillary: 139 mg/dL — ABNORMAL HIGH (ref 70–99)

## 2013-11-11 MED ORDER — OSELTAMIVIR PHOSPHATE 6 MG/ML PO SUSR
75.0000 mg | Freq: Two times a day (BID) | ORAL | Status: AC
  Administered 2013-11-11 – 2013-11-15 (×9): 75 mg
  Filled 2013-11-11 (×14): qty 12.5

## 2013-11-11 MED ORDER — IPRATROPIUM-ALBUTEROL 0.5-2.5 (3) MG/3ML IN SOLN
3.0000 mL | RESPIRATORY_TRACT | Status: DC
  Administered 2013-11-11 – 2013-11-18 (×46): 3 mL via RESPIRATORY_TRACT
  Filled 2013-11-11 (×46): qty 3

## 2013-11-11 MED ORDER — MIDAZOLAM HCL 2 MG/2ML IJ SOLN
1.0000 mg | INTRAMUSCULAR | Status: DC | PRN
  Administered 2013-11-11 – 2013-11-12 (×10): 2 mg via INTRAVENOUS
  Filled 2013-11-11 (×11): qty 2

## 2013-11-11 MED ORDER — DOCUSATE SODIUM 50 MG/5ML PO LIQD
100.0000 mg | Freq: Two times a day (BID) | ORAL | Status: DC
  Administered 2013-11-11 – 2013-11-14 (×8): 100 mg
  Filled 2013-11-11 (×10): qty 10

## 2013-11-11 MED ORDER — GUAIFENESIN 100 MG/5ML PO SYRP
300.0000 mg | ORAL_SOLUTION | Freq: Four times a day (QID) | ORAL | Status: DC
  Administered 2013-11-11 – 2013-11-13 (×8): 300 mg
  Filled 2013-11-11 (×12): qty 15

## 2013-11-11 MED ORDER — PANTOPRAZOLE SODIUM 40 MG PO PACK
40.0000 mg | PACK | Freq: Every day | ORAL | Status: DC
  Administered 2013-11-11 – 2013-11-15 (×5): 40 mg
  Filled 2013-11-11 (×7): qty 20

## 2013-11-11 MED ORDER — HYDRALAZINE HCL 20 MG/ML IJ SOLN
10.0000 mg | INTRAMUSCULAR | Status: DC | PRN
  Administered 2013-11-11: 20 mg via INTRAVENOUS
  Administered 2013-11-11: 40 mg via INTRAVENOUS
  Administered 2013-11-12: 20 mg via INTRAVENOUS
  Administered 2013-11-12: 30 mg via INTRAVENOUS
  Administered 2013-11-12: 10 mg via INTRAVENOUS
  Administered 2013-11-12: 20 mg via INTRAVENOUS
  Filled 2013-11-11 (×4): qty 1
  Filled 2013-11-11 (×2): qty 2
  Filled 2013-11-11: qty 1

## 2013-11-11 MED ORDER — CLONAZEPAM 0.5 MG PO TABS
0.5000 mg | ORAL_TABLET | Freq: Every day | ORAL | Status: DC
  Administered 2013-11-11: 0.5 mg via ORAL
  Filled 2013-11-11: qty 1

## 2013-11-11 NOTE — Progress Notes (Signed)
Inpatient Diabetes Program Recommendations  AACE/ADA: New Consensus Statement on Inpatient Glycemic Control (2013)  Target Ranges:  Prepandial:   less than 140 mg/dL      Peak postprandial:   less than 180 mg/dL (1-2 hours)      Critically ill patients:  140 - 180 mg/dL   Reason for Visit: Elevated Lab glucose while on IV steroids. 11/11/2013 13:24  Ref. Range 11/11/2013 04:39  Glucose Latest Range: 70-99 mg/dL 195 (H)   Diabetes history: No history  Please consider starting "ICU glycemic control" protocol, phase 1.  Thanks, Adah Perl, RN, BC-ADM Inpatient Diabetes Coordinator Pager [phone]

## 2013-11-11 NOTE — Progress Notes (Signed)
eLink Physician-Brief Progress Note Patient Name: CHESTINA KOMATSU DOB: [birth year] MRN: [id number]  Date of Service  11/11/2013   HPI/Events of Note  No standing nebs   eICU Interventions  Dc spiriva duonebs q 4h   Intervention Category Intermediate Interventions: Medication change / dose adjustment  Sheyli Horwitz V. 11/11/2013, 4:22 AM

## 2013-11-11 NOTE — Progress Notes (Signed)
[facility] PCCM   Name: [name] MRN: [id number] DOB: 1940-12-22    ADMISSION DATE:  11/08/2013 CONSULTATION DATE:  1/21   REFERRING MD :  Doyle Askew PRIMARY SERVICE: TRH  CHIEF COMPLAINT:  AE COPD  BRIEF PATIENT DESCRIPTION: 73 y/o female with COPD admitted from pulmonary clinic with AE COPD and Influenza A.  SIGNIFICANT EVENTS / STUDIES:  1/20 CT neck> salivary gland swelling due to sialadenitis 1/21 increased WOB, needing BIPAP  LINES / TUBES: 1/22 ETT>> 1/22 L IJ CVL >>  CULTURES: 1/20 Influenza A positive 1/22 blood >> 1/22 resp culture >> Staph aureus  ANTIBIOTICS: 1/20 clinda >>1/21 1/21 tamiflu >> 1/22 vanc >> 1/22 zosyn >>   SUBJECTIVE: Intubated on 1/23, some agitation/anxiety overnight and this morning  VITAL SIGNS: Temp:  [97.9 F (36.6 C)-100.6 F (38.1 C)] 97.9 F (36.6 C) (01/23 0900) Pulse Rate:  [26-108] 80 (01/23 0900) Resp:  [12-29] 17 (01/23 0900) BP: (123-251)/(48-220) 148/64 mmHg (01/23 0900) SpO2:  [89 %-100 %] 96 % (01/23 0900) FiO2 (%):  [40 %-50 %] 40 % (01/23 0900) Weight:  [75 kg (165 lb 5.5 oz)] 75 kg (165 lb 5.5 oz) (01/23 0445) HEMODYNAMICS:   VENTILATOR SETTINGS: Vent Mode:  [-] PRVC FiO2 (%):  [40 %-50 %] 40 % Set Rate:  [14 bmp] 14 bmp Vt Set:  [440 mL] 440 mL PEEP:  [5 cmH20] 5 cmH20 Plateau Pressure:  [17 cmH20-23 cmH20] 23 cmH20 INTAKE / OUTPUT: Intake/Output     01/22 0701 - 01/23 0700 01/23 0701 - 01/24 0700   P.O.     I.V. (mL/kg) 385 (5.1) 45 (0.6)   Other 260    NG/GT 361.2 90   IV Piggyback 550    Total Intake(mL/kg) 1556.1 (20.7) 135 (1.8)   Urine (mL/kg/hr) 925 (0.5)    Total Output 925     Net +631.1 +135        Urine Occurrence 2 x      PHYSICAL EXAMINATION: General: sedated, comfortable on vent HEENT: NCAT, BIPAP mask in place PULM: wheezing bilaterally CV: RRR, no mgr AB: BS+, soft, nontender Ext: warm, no edema Neuro: sedate on precedex  LABS:  CBC  Recent Labs Lab 11/09/13 0737  11/10/13 0400 11/11/13 0439  WBC 8.9 8.4 6.4  HGB 13.3 13.3 13.0  HCT 40.6 40.6 40.5  PLT 144* 121* 145*   Coag's  Recent Labs Lab 11/09/13 0737  INR 1.04   BMET  Recent Labs Lab 11/09/13 0737 11/10/13 0400 11/11/13 0439  NA 138 139 137  K 4.1 4.7 4.3  CL 98 101 100  CO2 25 26 27   BUN 24* 29* 30*  CREATININE 0.76 0.69 0.60  GLUCOSE 190* 149* 195*   Electrolytes  Recent Labs Lab 11/09/13 0737 11/10/13 0400 11/11/13 0439  CALCIUM 8.8 8.8 8.5   Sepsis Markers No results found for this basename: LATICACIDVEN, PROCALCITON, O2SATVEN,  in the last 168 hours ABG  Recent Labs Lab 11/08/13 2055 11/09/13 2230 11/10/13 1245  PHART 7.329* 7.379 7.304*  PCO2ART 49.0* 46.7* 56.8*  PO2ART 116.0* 125.0* 64.1*   Liver Enzymes  Recent Labs Lab 11/09/13 0737  AST 34  ALT 38*  ALKPHOS 127*  BILITOT 0.3  ALBUMIN 3.4*   Cardiac Enzymes  Recent Labs Lab 11/08/13 1715  PROBNP 199.9*   Glucose No results found for this basename: GLUCAP,  in the last 168 hours  Imaging   CXR: reviewed, emphysema and worsening infiltrate on left  ASSESSMENT / PLAN:  PULMONARY  A:  Severe Acute Exacerbation of COPD due to influenza A HCAP with worsening CXR findings Acute hypoxemic respiratory failure due to COPD exacerbation Hemoptysis> likely due to influenza, mild P:   -full vent support -abg now -duoneb scheduled q4h -maintain steroids to q12 -may need to hold plavix if hemop  Cardiovascular: A: Hemodynamically stable Hypertension CAD, DES 2013 P: -monitor BP post intubation -continue metoprolol -continue plavix for now  GASTROINTESTINAL A:  Constipation Sialadenitis > resolved P:   -monitor neck exam -add senna -continue probiotic and colace  INFECTIOUS A:  Influenza A HCAP > Staph P:   -tamiflu -vanc, zosyn -blood culture today  Neuro: A: Anxiety/agitation P: -precedex -prn versed -prn fentanyl   CC time 40 minutes  Jillyn Hidden PCCM Pager: [phone] Cell: [phone] If no response, call [phone]  11/11/2013, 9:56 AM

## 2013-11-11 NOTE — Progress Notes (Signed)
[facility] Progress Note Patient Name: SIARRA GILKERSON DOB: 1941/10/19 MRN: [id number]  Date of Service  11/11/2013   HPI/Events of Note     eICU Interventions  Hydralazine prn Treat pain/ anxiety first - consider resuming klonopin   Intervention Category Intermediate Interventions: Hypertension - evaluation and management  Brityn Mastrogiovanni V. 11/11/2013, 1:44 AM

## 2013-11-12 ENCOUNTER — Inpatient Hospital Stay (HOSPITAL_COMMUNITY): Payer: Medicare Other

## 2013-11-12 DIAGNOSIS — G9340 Encephalopathy, unspecified: Secondary | ICD-10-CM

## 2013-11-12 DIAGNOSIS — J189 Pneumonia, unspecified organism: Secondary | ICD-10-CM

## 2013-11-12 DIAGNOSIS — I519 Heart disease, unspecified: Secondary | ICD-10-CM

## 2013-11-12 LAB — BLOOD GAS, ARTERIAL
Acid-Base Excess: 1.6 mmol/L (ref 0.0–2.0)
Bicarbonate: 26.1 mEq/L — ABNORMAL HIGH (ref 20.0–24.0)
DRAWN BY: 31814
FIO2: 0.4 %
LHR: 14 {breaths}/min
MECHVT: 440 mL
O2 SAT: 96.6 %
PATIENT TEMPERATURE: 98.6
PEEP/CPAP: 5 cmH2O
PO2 ART: 92.6 mmHg (ref 80.0–100.0)
TCO2: 23.2 mmol/L (ref 0–100)
pCO2 arterial: 42.7 mmHg (ref 35.0–45.0)
pH, Arterial: 7.403 (ref 7.350–7.450)

## 2013-11-12 LAB — BASIC METABOLIC PANEL
BUN: 34 mg/dL — AB (ref 6–23)
CALCIUM: 8.4 mg/dL (ref 8.4–10.5)
CO2: 25 mEq/L (ref 19–32)
Chloride: 102 mEq/L (ref 96–112)
Creatinine, Ser: 0.59 mg/dL (ref 0.50–1.10)
GFR calc Af Amer: >90 mL/min (ref >90)
GFR calc non Af Amer: 89 mL/min — ABNORMAL LOW (ref >90)
GLUCOSE: 223 mg/dL — AB (ref 70–99)
Potassium: 3.7 mEq/L (ref 3.7–5.3)
Sodium: 137 mEq/L (ref 137–147)

## 2013-11-12 LAB — GLUCOSE, CAPILLARY
Glucose-Capillary: 206 mg/dL — ABNORMAL HIGH (ref 70–99)
Glucose-Capillary: 180 mg/dL — ABNORMAL HIGH (ref 70–99)
Glucose-Capillary: 164 mg/dL — ABNORMAL HIGH (ref 70–99)
Glucose-Capillary: 151 mg/dL — ABNORMAL HIGH (ref 70–99)
Glucose-Capillary: 127 mg/dL — ABNORMAL HIGH (ref 70–99)

## 2013-11-12 LAB — TROPONIN I

## 2013-11-12 LAB — CULTURE, RESPIRATORY: Special Requests: NORMAL

## 2013-11-12 LAB — CULTURE, RESPIRATORY W GRAM STAIN

## 2013-11-12 MED ORDER — PROPOFOL 10 MG/ML IV EMUL
5.0000 ug/kg/min | INTRAVENOUS | Status: DC
  Administered 2013-11-12: 70 ug/kg/min via INTRAVENOUS
  Administered 2013-11-12: 60 ug/kg/min via INTRAVENOUS
  Administered 2013-11-12: 10 ug/kg/min via INTRAVENOUS
  Administered 2013-11-13 (×2): 40 ug/kg/min via INTRAVENOUS
  Administered 2013-11-13: 70 ug/kg/min via INTRAVENOUS
  Administered 2013-11-14: 15 ug/kg/min via INTRAVENOUS
  Administered 2013-11-14: 40 ug/kg/min via INTRAVENOUS
  Administered 2013-11-15: 10 ug/kg/min via INTRAVENOUS
  Filled 2013-11-12 (×11): qty 100

## 2013-11-12 MED ORDER — POTASSIUM CHLORIDE 10 MEQ/50ML IV SOLN
10.0000 meq | INTRAVENOUS | Status: AC
  Administered 2013-11-12 (×2): 10 meq via INTRAVENOUS
  Filled 2013-11-12 (×2): qty 50

## 2013-11-12 MED ORDER — FENTANYL CITRATE 0.05 MG/ML IJ SOLN
50.0000 ug | INTRAMUSCULAR | Status: DC | PRN
  Administered 2013-11-12: 100 ug via INTRAVENOUS
  Administered 2013-11-12: 200 ug via INTRAVENOUS
  Administered 2013-11-12 (×3): 100 ug via INTRAVENOUS
  Administered 2013-11-13 (×3): 50 ug via INTRAVENOUS
  Administered 2013-11-13: 100 ug via INTRAVENOUS
  Filled 2013-11-12: qty 2
  Filled 2013-11-12: qty 4
  Filled 2013-11-12 (×7): qty 2

## 2013-11-12 MED ORDER — FENTANYL CITRATE 0.05 MG/ML IJ SOLN: 200.0000 ug | INTRAMUSCULAR | Status: AC

## 2013-11-12 NOTE — Progress Notes (Signed)
[facility] PCCM   Name: [name] MRN: [id number] DOB: [birth year]   LOS 4  Days PCP Unice Cobble, MD Pulmonary: Dr [name]  ADMISSION DATE:  11/08/2013 CONSULTATION DATE:  1/21   REFERRING MD :  Doyle Askew PRIMARY SERVICE: TRH  CHIEF COMPLAINT:  AE COPD  BRIEF PATIENT DESCRIPTION: 73 y/o female with COPD admitted from pulmonary clinic ([facility] patient) with AE COPD and Influenza A (echo 08/21/12 at [facility]  -e f 45%). Baseline described as highly anxious. Daughter is ER nurse at [employer] / STUDIES:  1/20 CT neck> salivary gland swelling due to sialadenitis 1/21 increased WOB, needing BIPAP 11/11/13: ntubated on 1/22  some agitation/anxiety overnight and this morning   LINES / TUBES: 1/22 ETT>> 1/22 L IJ CVL >>  CULTURES: 11/08/13 - MRSA PCR - neg 1/20 Influenza A positive 1/22 blood >> 1/22 resp culture >> Staph aureus (sensit pending)      ANTIBIOTICS: 1/20 clinda >>1/21 1/21 tamiflu >> 1/22 vanc >> 1/22 zosyn >>   SUBJECTIVE:   11/12/13: Bradycardia with precedex overnight and then resolved. Currently meets SBT criteria but RN feels patient will benefit from more fentanyl prn  VITAL SIGNS: Temp:  [95.7 F (35.4 C)-98.4 F (36.9 C)] 97.9 F (36.6 C) (01/24 0600) Pulse Rate:  [49-87] 87 (01/24 0600) Resp:  [12-29] 17 (01/24 0600) BP: (148-217)/(53-90) 184/54 mmHg (01/24 0646) SpO2:  [95 %-99 %] 98 % (01/24 0600) FiO2 (%):  [40 %] 40 % (01/24 0510) Weight:  [76.4 kg (168 lb 6.9 oz)] 76.4 kg (168 lb 6.9 oz) (01/24 0400) HEMODYNAMICS:   VENTILATOR SETTINGS: Vent Mode:  [-] PRVC FiO2 (%):  [40 %] 40 % Set Rate:  [14 bmp] 14 bmp Vt Set:  [440 mL] 440 mL PEEP:  [5 cmH20] 5 cmH20 Plateau Pressure:  [13 cmH20-26 cmH20] 18 cmH20 INTAKE / OUTPUT: Intake/Output     01/23 0701 - 01/24 0700 01/24 0701 - 01/25 0700   I.V. (mL/kg) 1013.9 (13.3)    Other     NG/GT 906.7    IV Piggyback 537.5    Total Intake(mL/kg) 2458.1 (32.2)    Urine (mL/kg/hr)  1730 (0.9)    Total Output 1730     Net +728.1            PHYSICAL EXAMINATION: General: sedated, comfortable on vent HEENT: NCAT, BIPAP mask in place PULM: NO wheezing bilaterally CV: RRR, no mgr AB: BS+, soft, nontender Ext: warm, no edema Neuro: sedate on precedex  LABS: PULMONARY  Recent Labs Lab 11/08/13 2055 11/09/13 2230 11/10/13 1245 11/11/13 1050 11/12/13 0513  PHART 7.329* 7.379 7.304* 7.365 7.403  PCO2ART 49.0* 46.7* 56.8* 47.0* 42.7  PO2ART 116.0* 125.0* 64.1* 114.0* 92.6  HCO3 25.0* 26.7* 27.4* 26.2* 26.1*  TCO2 22.5 23.9 24.8 23.5 23.2  O2SAT 97.7 98.1 89.1 97.7 96.6    CBC  Recent Labs Lab 11/10/13 0400 11/11/13 0439 11/11/13 1115  HGB 13.3 13.0 13.1  HCT 40.6 40.5 40.1  WBC 8.4 6.4 7.6  PLT 121* 145* 127*    COAGULATION  Recent Labs Lab 11/09/13 0737  INR 1.04    CARDIAC  No results found for this basename: TROPONINI,  in the last 168 hours  Recent Labs Lab 11/08/13 1715  PROBNP 199.9*     CHEMISTRY  Recent Labs Lab 11/08/13 1715 11/09/13 0737 11/10/13 0400 11/11/13 0439 11/12/13 0408  NA 138 138 139 137 137  K 4.2 4.1 4.7 4.3 3.7  CL 98 98  101 100 102  CO2 25 25 26 27 25   GLUCOSE 94 190* 149* 195* 223*  BUN 22 24* 29* 30* 34*  CREATININE 0.80 0.76 0.69 0.60 0.59  CALCIUM 9.3 8.8 8.8 8.5 8.4   Estimated Creatinine Clearance: 63.6 ml/min (by C-G formula based on Cr of 0.59).   LIVER  Recent Labs Lab 11/09/13 0737  AST 34  ALT 38*  ALKPHOS 127*  BILITOT 0.3  PROT 6.9  ALBUMIN 3.4*  INR 1.04     INFECTIOUS No results found for this basename: LATICACIDVEN, PROCALCITON,  in the last 168 hours   ENDOCRINE CBG (last 3)   Recent Labs  11/11/13 1958 11/11/13 2329 11/12/13 0339  GLUCAP 139* 139* 206*         IMAGING x48h  Dg Chest Port 1 View  11/12/2013   CLINICAL DATA:  Evaluate endotracheal tube placement.  EXAM: PORTABLE CHEST - 1 VIEW  COMPARISON:  Chest x-ray 11/11/2013.  FINDINGS:  An endotracheal tube is in place with tip 2.5 cm above the carina. There is a left-sided internal jugular central venous catheter with tip terminating in the distal superior vena cava. A nasogastric tube is seen extending into the stomach, however, the tip of the nasogastric tube extends below the lower margin of the image. Lung volumes remain slightly low. There are patchy areas of interstitial prominence throughout the lungs bilaterally, most pronounced in the right base and throughout the entire left lung, similar to the recent prior study. In a similar distribution, there is some patchy ill-defined airspace opacities which appear slightly worsened. Relative sparing of the right upper lobe. No definite pleural effusions. Findings do not appear to represent edema given the lack of cephalization of pulmonary vasculature and normal heart size. Mediastinal contours are unremarkable. Atherosclerosis in the thoracic aorta.  IMPRESSION: 1. Support apparatus, as above. 2. Patchy multifocal asymmetrically distributed interstitial and airspace disease, slightly increased compared to the prior study, favored to reflect a multilobar bronchopneumonia. 3. Atherosclerosis.   Electronically Signed   By: Vinnie Langton M.D.   On: 11/12/2013 07:42   Portable Chest Xray In Am  11/11/2013   CLINICAL DATA:  Hypoxia  EXAM: PORTABLE CHEST - 1 VIEW  COMPARISON:  November 10, 2013  FINDINGS: Endotracheal tube tip is 4.8 cm above the carina. Central catheter tip is in the superior vena cava. Nasogastric tube tip and side port are positioned below the diaphragm. No pneumothorax. Patchy interstitial edema remains without change. There is no new opacity. Heart is upper normal in size with normal pulmonary vascularity. No adenopathy.  IMPRESSION: Tube and catheter positions as described without pneumothorax. Patchy edema, stable. No new opacity.   Electronically Signed   By: Lowella Grip M.D.   On: 11/11/2013 07:02   Dg Chest Port  1 View  11/10/2013   ADDENDUM REPORT: 11/10/2013 14:04  ADDENDUM: A central line was placed but the position was not described in the original dictation. I was asked to addend the report to indicate the position of the left IJ central line. The tip is partly obscured by overlying cardiac leads but appears to be located over the upper SVC. No pneumothorax. The original report is otherwise correct as originally dictated. This was discussed with the nurse taking care of the patient.  Addendum by Dr. Conchita Paris on 11/10/2013 at 2:05 p.m.   Electronically Signed   By: Conchita Paris M.D.   On: 11/10/2013 14:04   11/10/2013   CLINICAL DATA:  Intubation.  EXAM: PORTABLE CHEST - 1 VIEW  COMPARISON:  11/09/2013.  FINDINGS: Endotracheal tube noted with its tip 2.8 cm above the carina. NGT tip noted below left hemidiaphragm. Mediastinum and hilar structures are normal. Heart size stable. Previously identified patchy pulmonary infiltrates in the left lower lobe again noted. Tiny pleural effusions cannot be excluded. No pneumothorax. No acute osseous abnormality.  IMPRESSION: 1. Endotracheal tube tip noted 2.8 cm above the carina. NG tube noted below left hemidiaphragm.  2. Persistent mild unchanged left lower lobe pulmonary infiltrates noted.  Electronically Signed: By: Marcello Moores  Register On: 11/10/2013 13:05       ASSESSMENT / PLAN:  PULMONARY A:  Severe Acute Exacerbation of COPD due to influenza A HCAP with worsening CXR findings Acute hypoxemic respiratory failure due to COPD exacerbation Hemoptysis> likely due to influenza, mild  11/12/13: Meets SBT criteria but doubt extubatable given anxiety and infiltrates. No hemoptysis  P:   - SBT but careful with extubation decision -full vent support -abg now -duoneb scheduled q4h -maintain steroids to q12 -may need to hold plavix if hemoptysis recurrs   Cardiovascular: A: Hemodynamically stable Hypertension CAD, DES 2013 EF 45% at [facility] in  2013  P: -monitor BP post intubation -continue metoprolol -continue plavix for now -  - check troponin, bnp and echp  GASTROINTESTINAL A:  Constipation Sialadenitis > resolved P:   -monitor neck exam -add senna -continue probiotic and colace  INFECTIOUS A:  Influenza A HCAP > Staph P:   -tamiflu -vanc, zosyn -blood culture today - await sensitivities  Neuro: A: Anxiety/agitation P: -precedex -prn versed -prn fentanyl  GLOBAL 11/12/13: No family at bedside. SBT today but careful with extubation decision. Check cardiac status   The patient is critically ill with multiple organ systems failure and requires high complexity decision making for assessment and support, frequent evaluation and titration of therapies, application of advanced monitoring technologies and extensive interpretation of multiple databases.   Critical Care Time devoted to patient care services described in this note is  35  Minutes.  Dr. Brand Males, M.D., [facility].C.P Pulmonary and Critical Care Medicine Staff Physician [facility] Pager: [phone], If no answer or between  15:00h - 7:00h: call [phone]  11/12/2013 8:42 AM

## 2013-11-12 NOTE — Progress Notes (Signed)
1833 patient received 200 mcg of fentanyl IV.  At 1913 versed  2mg  IV.  Propofol drip is infusing at 70 mcg per hour.  Patient was beating on side of the bed before versed 2 mg administered.  She indicated that she wanted to sleep.  Patient only sleeping in 15 minute to one hour increments.  Rass changes quickly from a -2 to a 3.  Notified Dr. Encarnacion Slates, Warren Lacy of patient agitation.

## 2013-11-12 NOTE — Progress Notes (Signed)
Since initiating propofol drip, bp has came down to 120/45 and patient is resting quietly.

## 2013-11-12 NOTE — Progress Notes (Signed)
  Echocardiogram 2D Echocardiogram has been performed.  [name] 11/12/2013, 10:37 AM

## 2013-11-12 NOTE — Progress Notes (Signed)
Bp continues to be greater than 180 after two administrations of apresoline IV.  Patient sleeps less than one hour intervals before awakening in an agitated state.  Notified Dr. Oliver Pila, Warren Lacy. Propofol drip ordered.

## 2013-11-12 NOTE — Progress Notes (Signed)
[facility] ADULT ICU REPLACEMENT PROTOCOL FOR AM LAB REPLACEMENT ONLY  The patient does apply for the [facility] Adult ICU Electrolyte Replacment Protocol based on the criteria listed below:   1. Is GFR >/= 40 ml/min? yes  Patient's GFR today is 89 2. Is urine output >/= 0.5 ml/kg/hr for the last 6 hours? yes Patient's UOP is 1.6 ml/kg/hr 3. Is BUN < 60 mg/dL? yes  Patient's BUN today is 34 4. Abnormal electrolyte(s): K is 3.7 5. Ordered repletion with:protocol 6. If a panic level lab has been reported, has the CCM MD in charge been notified? no.   Physician:    Fraser Din [place] 11/12/2013 6:35 AM;ink

## 2013-11-13 ENCOUNTER — Inpatient Hospital Stay (HOSPITAL_COMMUNITY): Payer: Medicare Other

## 2013-11-13 DIAGNOSIS — J15211 Pneumonia due to Methicillin susceptible Staphylococcus aureus: Secondary | ICD-10-CM

## 2013-11-13 LAB — CBC WITH DIFFERENTIAL/PLATELET
BASOS ABS: 0 10*3/uL (ref 0.0–0.1)
BASOS PCT: 0 % (ref 0–1)
EOS PCT: 0 % (ref 0–5)
Eosinophils Absolute: 0 10*3/uL (ref 0.0–0.7)
HEMATOCRIT: 38.4 % (ref 36.0–46.0)
HEMOGLOBIN: 12.4 g/dL (ref 12.0–15.0)
LYMPHS PCT: 4 % — AB (ref 12–46)
Lymphs Abs: 0.7 10*3/uL (ref 0.7–4.0)
MCH: 28.4 pg (ref 26.0–34.0)
MCHC: 32.3 g/dL (ref 30.0–36.0)
MCV: 87.9 fL (ref 78.0–100.0)
MONOS PCT: 6 % (ref 3–12)
Monocytes Absolute: 1 10*3/uL (ref 0.1–1.0)
NEUTROS ABS: 15.5 10*3/uL — AB (ref 1.7–7.7)
Neutrophils Relative %: 90 % — ABNORMAL HIGH (ref 43–77)
Platelets: 215 10*3/uL (ref 150–400)
RBC: 4.37 MIL/uL (ref 3.87–5.11)
RDW: 14.6 % (ref 11.5–15.5)
WBC: 17.2 10*3/uL — AB (ref 4.0–10.5)

## 2013-11-13 LAB — BASIC METABOLIC PANEL
BUN: 28 mg/dL — ABNORMAL HIGH (ref 6–23)
CO2: 26 meq/L (ref 19–32)
Calcium: 8.2 mg/dL — ABNORMAL LOW (ref 8.4–10.5)
Chloride: 101 mEq/L (ref 96–112)
Creatinine, Ser: 0.59 mg/dL (ref 0.50–1.10)
GFR calc Af Amer: >90 mL/min (ref >90)
GFR calc non Af Amer: 89 mL/min — ABNORMAL LOW (ref >90)
GLUCOSE: 190 mg/dL — AB (ref 70–99)
POTASSIUM: 4.3 meq/L (ref 3.7–5.3)
SODIUM: 137 meq/L (ref 137–147)

## 2013-11-13 LAB — GLUCOSE, CAPILLARY
GLUCOSE-CAPILLARY: 125 mg/dL — AB (ref 70–99)
GLUCOSE-CAPILLARY: 195 mg/dL — AB (ref 70–99)
GLUCOSE-CAPILLARY: 154 mg/dL — AB (ref 70–99)
Glucose-Capillary: 129 mg/dL — ABNORMAL HIGH (ref 70–99)
Glucose-Capillary: 177 mg/dL — ABNORMAL HIGH (ref 70–99)
Glucose-Capillary: 90 mg/dL (ref 70–99)

## 2013-11-13 LAB — PHOSPHORUS: Phosphorus: 2.3 mg/dL (ref 2.3–4.6)

## 2013-11-13 LAB — MAGNESIUM: Magnesium: 2.2 mg/dL (ref 1.5–2.5)

## 2013-11-13 LAB — TROPONIN I: Troponin I: <0.3 ng/mL (ref <0.30)

## 2013-11-13 MED ORDER — CLONAZEPAM 0.5 MG PO TABS: 0.5000 mg | ORAL_TABLET | Freq: Every day | ORAL | Status: DC

## 2013-11-13 MED ORDER — IRBESARTAN 150 MG PO TABS
150.0000 mg | ORAL_TABLET | Freq: Every day | ORAL | Status: DC
  Filled 2013-11-13: qty 1

## 2013-11-13 MED ORDER — GABAPENTIN 300 MG PO CAPS
300.0000 mg | ORAL_CAPSULE | Freq: Every day | ORAL | Status: DC
  Administered 2013-11-13 – 2013-11-14 (×2): 300 mg via ORAL
  Filled 2013-11-13 (×3): qty 1

## 2013-11-13 MED ORDER — VITAMINS A & D EX OINT
TOPICAL_OINTMENT | CUTANEOUS | Status: AC
  Administered 2013-11-13: 5
  Filled 2013-11-13: qty 5

## 2013-11-13 MED ORDER — HYDRALAZINE HCL 20 MG/ML IJ SOLN
10.0000 mg | INTRAMUSCULAR | Status: DC | PRN
  Administered 2013-11-15: 40 mg via INTRAVENOUS
  Administered 2013-11-15: 20 mg via INTRAVENOUS
  Administered 2013-11-15: 40 mg via INTRAVENOUS
  Filled 2013-11-13: qty 1
  Filled 2013-11-13 (×2): qty 2

## 2013-11-13 MED ORDER — LORAZEPAM 2 MG/ML IJ SOLN
1.0000 mg | Freq: Four times a day (QID) | INTRAMUSCULAR | Status: DC
  Administered 2013-11-13 (×2): 1 mg via INTRAVENOUS
  Filled 2013-11-13 (×2): qty 1

## 2013-11-13 MED ORDER — DOXYCYCLINE HYCLATE 100 MG IV SOLR
100.0000 mg | Freq: Two times a day (BID) | INTRAVENOUS | Status: DC
  Administered 2013-11-13 – 2013-11-17 (×9): 100 mg via INTRAVENOUS
  Filled 2013-11-13 (×9): qty 100

## 2013-11-13 MED ORDER — FUROSEMIDE 10 MG/ML IJ SOLN
20.0000 mg | Freq: Once | INTRAMUSCULAR | Status: AC
  Administered 2013-11-13: 20 mg via INTRAVENOUS
  Filled 2013-11-13: qty 2

## 2013-11-13 MED ORDER — AMLODIPINE BESYLATE 10 MG PO TABS
10.0000 mg | ORAL_TABLET | Freq: Every day | ORAL | Status: DC
  Filled 2013-11-13: qty 1

## 2013-11-13 MED ORDER — METHYLPREDNISOLONE SODIUM SUCC 40 MG IJ SOLR
40.0000 mg | Freq: Two times a day (BID) | INTRAMUSCULAR | Status: DC
  Administered 2013-11-13 – 2013-11-15 (×5): 40 mg via INTRAVENOUS
  Filled 2013-11-13 (×7): qty 1

## 2013-11-13 MED ORDER — LORAZEPAM 2 MG/ML IJ SOLN
2.0000 mg | Freq: Once | INTRAMUSCULAR | Status: AC
  Administered 2013-11-13: 2 mg via INTRAVENOUS
  Filled 2013-11-13: qty 1

## 2013-11-13 MED ORDER — ASPIRIN 81 MG PO CHEW
81.0000 mg | CHEWABLE_TABLET | Freq: Every day | ORAL | Status: DC
  Administered 2013-11-13 – 2013-11-16 (×3): 81 mg
  Filled 2013-11-13 (×6): qty 1

## 2013-11-13 MED ORDER — BISOPROLOL FUMARATE 10 MG PO TABS
20.0000 mg | ORAL_TABLET | Freq: Every day | ORAL | Status: DC
  Administered 2013-11-13 – 2013-11-17 (×5): 20 mg via ORAL
  Filled 2013-11-13 (×7): qty 2

## 2013-11-13 MED ORDER — FUROSEMIDE 10 MG/ML IJ SOLN
20.0000 mg | Freq: Two times a day (BID) | INTRAMUSCULAR | Status: DC
  Administered 2013-11-13 – 2013-11-17 (×9): 20 mg via INTRAVENOUS
  Filled 2013-11-13 (×12): qty 2

## 2013-11-13 NOTE — Progress Notes (Signed)
[facility] PCCM   Name: [name] MRN: [id number] DOB: 01/02/1941   LOS 5  Days PCP Unice Cobble, MD Pulmonary: Dr [name]  ADMISSION DATE:  11/08/2013 CONSULTATION DATE:  1/21   REFERRING MD :  Doyle Askew PRIMARY SERVICE: TRH  CHIEF COMPLAINT:  AE COPD  BRIEF PATIENT DESCRIPTION: 74 y/o female with COPD FEV 1L/48% admitted from pulmonary clinic ([facility] patient) with AE COPD and Influenza A (echo 08/21/12 at [facility]  -e f 45%). Baseline described as some anxious at baseline but described as Type A by daughter. Daughter is ER nurse at [employer] On gabapentin qhs for :night terrors" . She is awaiting shoulder surgery   has a past medical history of Other emphysema; Unspecified arthropathy, shoulder region; Gastric ulcer, unspecified as acute or chronic, without mention of hemorrhage, perforation, or obstruction; Benign paroxysmal positional vertigo; COPD (chronic obstructive pulmonary disease); Diverticula of colon; Osteopenia; Personal history of colonic polyps (2007); Diverticulitis; Hyperlipemia; Fracture of ankle, bimalleolar, right, closed; Hypoxemia; C. 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   has past surgical history that includes Total shoulder arthroplasty (2012); ORIF ankle fracture (2007); martial megeti (4128'N); Varicose vein surgery (8676; 1985); Total shoulder replacement (12/05/2010); Abdominal hysterectomy; Augmentation mammaplasty (1975); Coronary angioplasty with stent (2010); Coronary angioplasty (1990); Colon surgery (07/07/2011); Appendectomy (1960); Nose surgery; BREAST  AUGMENTTATION; CATARCT  RIGHT; Breast lumpectomy with needle localization and axillary sentinel lymph node bx (Left, 06/02/2013); and Breast implant removal (Left, 06/02/2013).   LINES / TUBES: 1/22 ETT>> 1/22 L IJ CVL >>  CULTURES: 11/08/13 - MRSA PCR - neg 1/20 Influenza A positive 1/22 blood >> 1/22 resp culture >> MSSA  ANTIBIOTICS: 1/20 clinda >>1/21 1/21 tamiflu >> 1/22 vanc >>1/25 1/22 zosyn >>1/15 1/25 dOXY iv (Mssa) >>    SIGNIFICANT EVENTS / STUDIES:  1/20 CT neck> salivary gland swelling due to sialadenitis 1/21 increased WOB, needing BIPAP 11/11/13: Intubated on 1/22  some agitation/anxiety overnight and this morning 11/12/13: Bradycardia with precedex overnight and then resolved. Currently meets SBT criteria but RN feels patient will benefit from more fentanyl prn 11/12/13: ECHO: Extremely poor acoustic windows limit study LV is difficult to see. Overall LVEF appears to be mild to moderately depressed. There is akinesis of the apex. Other walls are difficult to see well. The cavity size was normal    SUBJECTIVE/OVERNIGHT/INTERVAL HX  11/13/13: Very hypertensive sbp 200s (baseline: 120 on office visits on lopressor). Overnight precedex changed to diprivan. Off diprivan: bp sbp 200s. Did some SBT. Very anxious on sbt. Declined extubation.  Marland Kitchen  VITAL SIGNS: Temp:  [98.1 F (36.7 C)-100 F (37.8 C)] 99.5 F (37.5 C) (01/25 0900) Pulse Rate:  [67-138] 138 (01/25 0923) Resp:  [12-30] 29 (01/25 0900) BP: (120-233)/(45-96) 233/96 mmHg (01/25 0923) SpO2:  [95 %-99 %] 98 % (01/25 0900) FiO2 (%):  [40 %] 40 % (01/25 0900) Weight:  [78.3 kg (172 lb 9.9 oz)] 78.3 kg (172 lb 9.9 oz) (01/25 0403) HEMODYNAMICS:   VENTILATOR SETTINGS: Vent Mode:  [-] CPAP FiO2 (%):  [40 %] 40 % Set Rate:  [14 bmp] 14 bmp Vt Set:  [440 mL] 440 mL PEEP:  [5 cmH20] 5 cmH20 Pressure Support:  [5 cmH20-8 cmH20] 5  cmH20 Plateau Pressure:  [15 cmH20-21 cmH20] 21 cmH20 INTAKE /  OUTPUT: Intake/Output     01/24 0701 - 01/25 0700 01/25 0701 - 01/26 0700   I.V. (mL/kg) 1788.9 (22.8) 112.1 (1.4)   NG/GT 650 100   IV Piggyback 637.5    Total Intake(mL/kg) 3076.4 (39.3) 212.1 (2.7)   Urine (mL/kg/hr) 1255 (0.7) 50 (0.2)   Total Output 1255 50   Net +1821.4 +162.1        Stool Occurrence  1 x     PHYSICAL EXAMINATION: General: On vent HEENT: Intubated PULM: NO wheezing bilaterally.  CV: RRR, no mgr. sbp 200s AB: BS+, soft, nontender Ext: warm, no edema Neuro: on diprivan + prn fentanyl -> calm LABS: PULMONARY  Recent Labs Lab 11/08/13 2055 11/09/13 2230 11/10/13 1245 11/11/13 1050 11/12/13 0513  PHART 7.329* 7.379 7.304* 7.365 7.403  PCO2ART 49.0* 46.7* 56.8* 47.0* 42.7  PO2ART 116.0* 125.0* 64.1* 114.0* 92.6  HCO3 25.0* 26.7* 27.4* 26.2* 26.1*  TCO2 22.5 23.9 24.8 23.5 23.2  O2SAT 97.7 98.1 89.1 97.7 96.6    CBC  Recent Labs Lab 11/11/13 0439 11/11/13 1115 11/13/13 0340  HGB 13.0 13.1 12.4  HCT 40.5 40.1 38.4  WBC 6.4 7.6 17.2*  PLT 145* 127* 215    COAGULATION  Recent Labs Lab 11/09/13 0737  INR 1.04    CARDIAC    Recent Labs Lab 11/12/13 1030 11/12/13 1525 11/12/13 2130 11/13/13 0340  TROPONINI <0.30 <0.30 <0.30 <0.30    Recent Labs Lab 11/08/13 1715  PROBNP 199.9*     CHEMISTRY  Recent Labs Lab 11/09/13 0737 11/10/13 0400 11/11/13 0439 11/12/13 0408 11/13/13 0340  NA 138 139 137 137 137  K 4.1 4.7 4.3 3.7 4.3  CL 98 101 100 102 101  CO2 25 26 27 25 26   GLUCOSE 190* 149* 195* 223* 190*  BUN 24* 29* 30* 34* 28*  CREATININE 0.76 0.69 0.60 0.59 0.59  CALCIUM 8.8 8.8 8.5 8.4 8.2*  MG  --   --   --   --  2.2  PHOS  --   --   --   --  2.3   Estimated Creatinine Clearance: 64.3 ml/min (by C-G formula based on Cr of 0.59).   LIVER  Recent Labs Lab 11/09/13 0737  AST 34  ALT 38*  ALKPHOS 127*  BILITOT 0.3  PROT 6.9  ALBUMIN 3.4*  INR 1.04     INFECTIOUS No results found for this  basename: LATICACIDVEN, PROCALCITON,  in the last 168 hours   ENDOCRINE CBG (last 3)   Recent Labs  11/12/13 1603 11/12/13 2129 11/13/13 0742  GLUCAP 151* 127* 125*         IMAGING x48h  Dg Chest Port 1 View  11/13/2013   CLINICAL DATA:  Respiratory difficulty  EXAM: PORTABLE CHEST - 1 VIEW  COMPARISON:  Yesterday  FINDINGS: Tubular device is stable. Lungs remain hyperaerated. Edema improved. There remains relative sparing of the right upper lobe. No pneumothorax.  IMPRESSION: Improved edema.   Electronically Signed   By: Maryclare Bean M.D.   On: 11/13/2013 07:16   Dg Chest Port 1 View  11/12/2013   CLINICAL DATA:  Evaluate endotracheal tube placement.  EXAM: PORTABLE CHEST - 1 VIEW  COMPARISON:  Chest x-ray 11/11/2013.  FINDINGS: An endotracheal tube is in place with tip 2.5 cm above the carina. There is a left-sided internal jugular central venous catheter with tip terminating in the distal superior vena cava. A nasogastric  tube is seen extending into the stomach, however, the tip of the nasogastric tube extends below the lower margin of the image. Lung volumes remain slightly low. There are patchy areas of interstitial prominence throughout the lungs bilaterally, most pronounced in the right base and throughout the entire left lung, similar to the recent prior study. In a similar distribution, there is some patchy ill-defined airspace opacities which appear slightly worsened. Relative sparing of the right upper lobe. No definite pleural effusions. Findings do not appear to represent edema given the lack of cephalization of pulmonary vasculature and normal heart size. Mediastinal contours are unremarkable. Atherosclerosis in the thoracic aorta.  IMPRESSION: 1. Support apparatus, as above. 2. Patchy multifocal asymmetrically distributed interstitial and airspace disease, slightly increased compared to the prior study, favored to reflect a multilobar bronchopneumonia. 3. Atherosclerosis.    Electronically Signed   By: Vinnie Langton M.D.   On: 11/12/2013 07:42       ASSESSMENT / PLAN:  PULMONARY A:  Severe Acute Exacerbation of COPD due to influenza A HCAP with worsening CXR findings Acute hypoxemic respiratory failure due to COPD exacerbation Hemoptysis> likely due to influenza, mild  11/13/13: Did SBT but anxiety and severe BP precluding extubation   P:   - SBTas tolerated but no extubation 11/13/13 -full vent support -duoneb scheduled q4h -maintain steroids to q12; reduce dose -may need to hold plavix if hemoptysis recurrs   Cardiovascular: A: Hemodynamically stable Hypertension CAD, DES 2013 EF 45% at [facility] in 2013  11/13/13: SEvere hyperteension in ICU (baseline 120-140 sbp with lopressor at home) - ? Due to steroids, ? Anxiety, ? Agitation. Diprian helping  P: =- continue diprivan - reduce steroid dose = hydralaizine prn - change loperssor to bisoprolol - see CNS section for anxiety mgmt --continue plavix for now -  - check troponin, bnp and echp - lasix x 1  GASTROINTESTINAL A:  Constipation Sialadenitis > resolved P:   -monitor neck exam -add senna -continue probiotic and colace  INFECTIOUS A:  Influenza A HCAP > MSSA P:   -tamiflu contiue -dc vanc, zosyn Start IV Doxy   Neuro: A: Anxiety/agitation 11/13/13: Failed precedex 11/12/13. Needing diprivan. Hx of Type A personality and mild anxity. On nocturanl klonopin Hx of night terorros on neurotnin  P: -diprivan- - schedule low dose ativan - gabapentin qhs  GLOBAL 11/12/13: No family at bedside. SBT today but careful with extubation decision.  09/23/13: Daughter Janene Harvey  [phone] updated over phone from bedsie   The patient is critically ill with multiple organ systems failure and requires high complexity decision making for assessment and support, frequent evaluation and titration of therapies, application of advanced monitoring technologies and extensive  interpretation of multiple databases.   Critical Care Time devoted to patient care services described in this note is  40 Minutes.  Dr. Brand Males, M.D., [facility].C.P Pulmonary and Critical Care Medicine Staff Physician [place] [facility] Pager: [phone], If no answer or between  15:00h - 7:00h: call [phone]  11/13/2013 9:59 AM

## 2013-11-13 NOTE — Progress Notes (Signed)
Patient received 20 mg lasix IV at 1038 am.  Emptied 1425 ml from urinary catheter at 1330.  Propofol is infusing at 40 mcg.  Rass is -2.  Patient is sleeping quietly with decreased use of accessory muscle.

## 2013-11-14 ENCOUNTER — Ambulatory Visit: Payer: Medicare Other | Admitting: Oncology

## 2013-11-14 ENCOUNTER — Inpatient Hospital Stay (HOSPITAL_COMMUNITY): Payer: Medicare Other

## 2013-11-14 ENCOUNTER — Other Ambulatory Visit: Payer: Medicare Other

## 2013-11-14 ENCOUNTER — Other Ambulatory Visit: Payer: Self-pay | Admitting: Oncology

## 2013-11-14 DIAGNOSIS — J15211 Pneumonia due to Methicillin susceptible Staphylococcus aureus: Secondary | ICD-10-CM

## 2013-11-14 LAB — GLUCOSE, CAPILLARY
GLUCOSE-CAPILLARY: 134 mg/dL — AB (ref 70–99)
GLUCOSE-CAPILLARY: 127 mg/dL — AB (ref 70–99)
Glucose-Capillary: 119 mg/dL — ABNORMAL HIGH (ref 70–99)
Glucose-Capillary: 151 mg/dL — ABNORMAL HIGH (ref 70–99)
Glucose-Capillary: 117 mg/dL — ABNORMAL HIGH (ref 70–99)
Glucose-Capillary: 170 mg/dL — ABNORMAL HIGH (ref 70–99)

## 2013-11-14 LAB — BASIC METABOLIC PANEL
BUN: 26 mg/dL — ABNORMAL HIGH (ref 6–23)
CALCIUM: 8.5 mg/dL (ref 8.4–10.5)
CO2: 33 mEq/L — ABNORMAL HIGH (ref 19–32)
Chloride: 100 mEq/L (ref 96–112)
Creatinine, Ser: 0.57 mg/dL (ref 0.50–1.10)
GFR calc Af Amer: >90 mL/min (ref >90)
Glucose, Bld: 141 mg/dL — ABNORMAL HIGH (ref 70–99)
Potassium: 4.2 mEq/L (ref 3.7–5.3)
Sodium: 142 mEq/L (ref 137–147)

## 2013-11-14 LAB — MAGNESIUM: Magnesium: 2.1 mg/dL (ref 1.5–2.5)

## 2013-11-14 LAB — CBC WITH DIFFERENTIAL/PLATELET
BASOS ABS: 0 10*3/uL (ref 0.0–0.1)
Basophils Relative: 0 % (ref 0–1)
EOS ABS: 0 10*3/uL (ref 0.0–0.7)
Eosinophils Relative: 0 % (ref 0–5)
HCT: 35.8 % — ABNORMAL LOW (ref 36.0–46.0)
Hemoglobin: 11.9 g/dL — ABNORMAL LOW (ref 12.0–15.0)
Lymphocytes Relative: 9 % — ABNORMAL LOW (ref 12–46)
Lymphs Abs: 1.6 10*3/uL (ref 0.7–4.0)
MCH: 29.3 pg (ref 26.0–34.0)
MCHC: 33.2 g/dL (ref 30.0–36.0)
MCV: 88.2 fL (ref 78.0–100.0)
Monocytes Absolute: 1.4 10*3/uL — ABNORMAL HIGH (ref 0.1–1.0)
Monocytes Relative: 8 % (ref 3–12)
NEUTROS PCT: 83 % — AB (ref 43–77)
Neutro Abs: 14.4 10*3/uL — ABNORMAL HIGH (ref 1.7–7.7)
PLATELETS: 179 10*3/uL (ref 150–400)
RBC: 4.06 MIL/uL (ref 3.87–5.11)
RDW: 14.8 % (ref 11.5–15.5)
WBC: 17.4 10*3/uL — ABNORMAL HIGH (ref 4.0–10.5)

## 2013-11-14 LAB — TRIGLYCERIDES: Triglycerides: 80 mg/dL (ref <150)

## 2013-11-14 LAB — PHOSPHORUS: Phosphorus: 3.2 mg/dL (ref 2.3–4.6)

## 2013-11-14 MED ORDER — FUROSEMIDE 10 MG/ML IJ SOLN
20.0000 mg | Freq: Once | INTRAMUSCULAR | Status: AC
  Administered 2013-11-14: 20 mg via INTRAVENOUS

## 2013-11-14 NOTE — Progress Notes (Signed)
CARE MANAGEMENT NOTE 11/14/2013  Patient:  [name], [name]   Account Number:  [id number]  Date Initiated:  11/09/2013  Documentation initiated by:  Cherylee Rawlinson  Subjective/Objective Assessment:   hx of copd excerbation at the present due to influzena A positive.     Action/Plan:   home when stable/lives alone will follow for hhc versus st snf placement or ltac   Anticipated DC Date:  11/17/2013   Anticipated DC Plan:  HOME/SELF CARE  In-house referral  NA      DC Planning Services  NA      [facility] Choice  NA   Choice offered to / List presented to:  NA   DME arranged  NA      DME agency  NA     [place] arranged  NA      [place] agency  NA   Status of service:  In process, will continue to follow Medicare Important Message given?  NA - LOS <3 / Initial given by admissions (If response is "NO", the following Medicare IM given date fields will be blank) Date Medicare IM given:   Date Additional Medicare IM given:    Discharge Disposition:    Per UR Regulation:  Reviewed for med. necessity/level of care/duration of stay  If discussed at [place] of Stay Meetings, dates discussed:    Comments:  [id number]/Westlynn Fifer Rosana Hoes RN, BSN, [place], [phone] Chart reviewed for update of needs and condition. patient worsened and required intubation on [id number] vent 3/wbc-17.4, did test positive for influenza A/ 11/14/13: NO Extubatin as RASS -2  [id number]/Jyssica Rief Rosana Hoes, RN, BSN, [place] [phone] Chart Reviewed for discharge and hospital needs. Discharge needs at time of review:  None present will follow for needs. Review of patient progress due on [id number].

## 2013-11-14 NOTE — Progress Notes (Addendum)
[facility] PCCM   Name: [name] MRN: [id number] DOB: Jan 05, 1941   LOS 6  Days PCP Unice Cobble, MD Pulmonary: Dr [name]  ADMISSION DATE:  11/08/2013 CONSULTATION DATE:  1/21   REFERRING MD :  Doyle Askew PRIMARY SERVICE: TRH  CHIEF COMPLAINT:  AE COPD  BRIEF PATIENT DESCRIPTION: 73 y/o female with COPD FEV 1L/48% admitted from pulmonary clinic ([facility] patient) with AE COPD and Influenza A (echo 08/21/12 at [facility]  -e f 45%). Baseline described as some anxious at baseline but described as Type A by daughter. Daughter is ER nurse at [employer] On gabapentin qhs for :night terrors" . She is awaiting shoulder surgery    LINES / TUBES: 1/22 ETT>> 1/22 L IJ CVL >>  CULTURES: 11/08/13 - MRSA PCR - neg 1/20 Influenza A positive 1/22 blood >> 1/22 resp culture >> MSSA  ANTIBIOTICS: 1/20 clinda >>1/21 1/21 tamiflu >> 1/22 vanc >>1/25 1/22 zosyn >>1/15 1/25 Doxy  iv (Mssa) >>    SIGNIFICANT EVENTS / STUDIES:  1/20 CT neck> salivary gland swelling due to sialadenitis 1/21 increased WOB, needing BIPAP 11/11/13: Intubated on 1/22  some agitation/anxiety overnight and this morning 11/12/13: Bradycardia with precedex overnight and then resolved. Currently meets SBT criteria but RN feels patient will benefit from more fentanyl prn 11/12/13: ECHO: Extremely poor acoustic windows limit study LV is difficult to see. Overall LVEF appears to be mild to moderately depressed. There is akinesis of the apex. Other walls are difficult to see well. The cavity size was normal    SUBJECTIVE/OVERNIGHT/INTERVAL HX  11/14/13: STaff MD: 4mg  ativan x past 24h - now RASS -2. Doing SBT .  VITAL SIGNS: Temp:  [99.3 F (37.4 C)-100.2 F (37.9 C)] 100.2 F (37.9 C) (01/26 0900) Pulse Rate:  [81-109] 82 (01/26 0900) Resp:  [13-24] 15 (01/26 0900) BP: (131-217)/(54-92) 179/59 mmHg (01/26 0916) SpO2:  [94 %-99 %] 97 % (01/26 0916) FiO2 (%):  [35 %-40 %] 35 % (01/26 0916) Weight:  [171 lb 4.8 oz (77.7 kg)]  171 lb 4.8 oz (77.7 kg) (01/26 0400) HEMODYNAMICS:   VENTILATOR SETTINGS: Vent Mode:  [-] PSV FiO2 (%):  [35 %-40 %] 35 % Set Rate:  [14 bmp] 14 bmp Vt Set:  [440 mL] 440 mL PEEP:  [5 cmH20] 5 cmH20 Pressure Support:  [5 cmH20] 5 cmH20 Plateau Pressure:  [15 cmH20-25 cmH20] 15 cmH20 INTAKE / OUTPUT: Intake/Output     01/25 0701 - 01/26 0700 01/26 0701 - 01/27 0700   I.V. (mL/kg) 1470.3 (18.9)    NG/GT 1050 70   IV Piggyback 275    Total Intake(mL/kg) 2795.3 (36) 70 (0.9)   Urine (mL/kg/hr) 4575 (2.5) 180 (0.7)   Total Output 4575 180   Net -1779.7 -110        Stool Occurrence 1 x      PHYSICAL EXAMINATION: General: On vent, sedated HEENT: Intubated, no JVD PULM: prolonged exp phase   CV: RRR, no mgr. sbp 200s AB: BS+, soft, non tender(tube feeds off for possible extubation) Ext: warm, no edema Neuro: on diprivan + prn fentanyl -> calm LABS: PULMONARY  Recent Labs Lab 11/08/13 2055 11/09/13 2230 11/10/13 1245 11/11/13 1050 11/12/13 0513  PHART 7.329* 7.379 7.304* 7.365 7.403  PCO2ART 49.0* 46.7* 56.8* 47.0* 42.7  PO2ART 116.0* 125.0* 64.1* 114.0* 92.6  HCO3 25.0* 26.7* 27.4* 26.2* 26.1*  TCO2 22.5 23.9 24.8 23.5 23.2  O2SAT 97.7 98.1 89.1 97.7 96.6    CBC  Recent Labs Lab 11/11/13  1115 11/13/13 0340 11/14/13 0640  HGB 13.1 12.4 11.9*  HCT 40.1 38.4 35.8*  WBC 7.6 17.2* 17.4*  PLT 127* 215 179    COAGULATION  Recent Labs Lab 11/09/13 0737  INR 1.04    CARDIAC    Recent Labs Lab 11/12/13 1030 11/12/13 1525 11/12/13 2130 11/13/13 0340  TROPONINI <0.30 <0.30 <0.30 <0.30    Recent Labs Lab 11/08/13 1715  PROBNP 199.9*     CHEMISTRY  Recent Labs Lab 11/10/13 0400 11/11/13 0439 11/12/13 0408 11/13/13 0340 11/14/13 0640  NA 139 137 137 137 142  K 4.7 4.3 3.7 4.3 4.2  CL 101 100 102 101 100  CO2 26 27 25 26  33*  GLUCOSE 149* 195* 223* 190* 141*  BUN 29* 30* 34* 28* 26*  CREATININE 0.69 0.60 0.59 0.59 0.57  CALCIUM 8.8  8.5 8.4 8.2* 8.5  MG  --   --   --  2.2 2.1  PHOS  --   --   --  2.3 3.2   Estimated Creatinine Clearance: 64.1 ml/min (by C-G formula based on Cr of 0.57).   LIVER  Recent Labs Lab 11/09/13 0737  AST 34  ALT 38*  ALKPHOS 127*  BILITOT 0.3  PROT 6.9  ALBUMIN 3.4*  INR 1.04     INFECTIOUS No results found for this basename: LATICACIDVEN, PROCALCITON,  in the last 168 hours   ENDOCRINE CBG (last 3)   Recent Labs  11/13/13 2050 11/14/13 0426 11/14/13 0758  GLUCAP 90 151* 117*         IMAGING x48h  Dg Chest Port 1 View  11/14/2013   CLINICAL DATA:  Evaluate endotracheal tube position.  EXAM: PORTABLE CHEST - 1 VIEW  COMPARISON:  DG CHEST 1V PORT dated 11/13/2013  FINDINGS: Endotracheal tube unchanged with 1 4 cm above carina. Nasogastric extends beyond the inferior aspect of the film. Left-sided internal jugular line terminates at low SVC.  Underline hyperinflation/ COPD. Normal heart size. No pleural effusion or pneumothorax. Improved interstitial edema, with asymmetric pulmonary venous congestion remaining. Greater on the left.  IMPRESSION: COPD/chronic bronchitis with improved pulmonary venous congestion.  Stable position of endotracheal tube.   Electronically Signed   By: Abigail Miyamoto M.D.   On: 11/14/2013 08:10   Dg Chest Port 1 View  11/13/2013   CLINICAL DATA:  Respiratory difficulty  EXAM: PORTABLE CHEST - 1 VIEW  COMPARISON:  Yesterday  FINDINGS: Tubular device is stable. Lungs remain hyperaerated. Edema improved. There remains relative sparing of the right upper lobe. No pneumothorax.  IMPRESSION: Improved edema.   Electronically Signed   By: Maryclare Bean M.D.   On: 11/13/2013 07:16       ASSESSMENT / PLAN:  PULMONARY A:  Severe Acute Exacerbation of COPD due to influenza A HCAP with worsening CXR findings Acute hypoxemic respiratory failure due to COPD exacerbation Hemoptysis> likely due to influenza, mild  11/14/13: NO Extubatin as RASS -2    P:    - SBTas tolerated consider extubation 1-26 -full vent support -duoneb scheduled q4h -maintain steroids to q12; reduced dose -may need to hold plavix if hemoptysis recurrs   Cardiovascular: A: Hemodynamically stable Hypertension CAD, DES 2013 EF 45% at [facility] in 2013  Intake/Output Summary (Last 24 hours) at 11/14/13 1030 Last data filed at 11/14/13 1000  Gross per 24 hour  Intake 2682.73 ml  Output   4655 ml  Net -1972.27 ml      P: - continue diprivan if not  extubated - reduce steroid dose - hydralaizine prn - changed loperssor to bisoprolol - see CNS section for anxiety mgmt --continue plavix for now - check troponin, bnp and echo - lasix to continue at Q12h  GASTROINTESTINAL A:  Constipation Sialadenitis > resolved P:   -monitor neck exam -add senna -continue probiotic and colace  INFECTIOUS A:  Influenza A HCAP > MSSA P:   -tamiflu contiue -dc vanc, zosyn Started IV Doxy see flows   Neuro: A: Anxiety/agitation 11/13/13: Failed precedex 11/12/13. Needing diprivan. Hx of Type A personality and mild anxity. 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 SBT today but careful with extubation decision.  09/23/13: Daughter Janene Harvey  [phone] updated over phone from bedsie 1/26 no exutbation  Daughter Caryl Pina updated over phone   [facility] ACNP Maryanna Shape PCCM Pager [phone] till 3 pm If no answer page [phone] 11/14/2013, 10:31 AM   STAFF NOTE: I, Dr Ann Lions have personally reviewed patient's available data, including medical history, events of note, physical examination and test results as part of my evaluation. I have discussed with resident/NP and other care providers such as  pharmacist, RN and RRT.  In addition,  I personally evaluated patient and elicited key findings of acute resp failure due to viral and secondary MSSA AECOPD/pna. No extubation today; too drowsy. DC ativan. Continue diprivan; monitor for diprivan with lactate and ck  Rest per NP/medical resident whose note is outlined above and that I agree with  The patient is critically ill with multiple organ systems failure and requires high complexity decision making for assessment and support, frequent evaluation and titration of therapies, application of advanced monitoring technologies and extensive interpretation of multiple databases.   Critical Care Time devoted to patient care services described in this note is  35  Minutes.  Dr. Brand Males, M.D., [facility].C.P Pulmonary and Critical Care Medicine Staff Physician [place] [facility] Pager: [phone], If no answer or between  15:00h - 7:00h: call [phone]  11/14/2013 12:00 PM

## 2013-11-14 NOTE — Progress Notes (Signed)
Pt passed SBT of 5/5 and is tolerating well at this time. No complications noted. RT will monitor.

## 2013-11-14 NOTE — Progress Notes (Signed)
Pt placed back on full support due to no plans to extubate the pt. Pt still very lethargic even with all sedation off and pt continues to not follow commands. RT will monitor.

## 2013-11-14 NOTE — Progress Notes (Signed)
COURTESY NOTE:  Patient was scheduled for followup at the cancer center today, which made [place] aware of her current situation. In brief:  This 73 y.o. [place] woman is status post left breast biopsy 04/08/2013 for a clinical T1c N0, stage IA invasive ductal carcinoma, grade 1, estrogen and progesterone receptor positive, with no HER-2 amplification, and an MIB-1 of 7%  (1) status post left lumpectomy and sentinel lymph node sampling age 73 2014 for a pT1c pN0, stage IA invasive ductal carcinoma, grade 1, with repeat HER-2 again negative  (2) Oncotype score of 6 predicts a distant disease recurrent rate of 5% within 10 years if the patient's only systemic treatment is tamoxifen for 5 years.  (3)  started anastrozole August 2014  When patient is taking po's normally again her anastrozole 1 mg. Po/ day should be resumed. We will scheule a six-month follow up here for her.  Please let me know if I can be of assistance.

## 2013-11-15 ENCOUNTER — Telehealth: Payer: Self-pay | Admitting: *Deleted

## 2013-11-15 ENCOUNTER — Inpatient Hospital Stay (HOSPITAL_COMMUNITY): Payer: Medicare Other

## 2013-11-15 LAB — PRO B NATRIURETIC PEPTIDE: Pro B Natriuretic peptide (BNP): 969.8 pg/mL — ABNORMAL HIGH (ref 0–125)

## 2013-11-15 LAB — CBC WITH DIFFERENTIAL/PLATELET
Basophils Absolute: 0.1 10*3/uL (ref 0.0–0.1)
Basophils Relative: 0 % (ref 0–1)
EOS ABS: 0 10*3/uL (ref 0.0–0.7)
Eosinophils Relative: 0 % (ref 0–5)
HCT: 38.5 % (ref 36.0–46.0)
HEMOGLOBIN: 12.3 g/dL (ref 12.0–15.0)
Lymphocytes Relative: 14 % (ref 12–46)
Lymphs Abs: 2.3 10*3/uL (ref 0.7–4.0)
MCH: 28.2 pg (ref 26.0–34.0)
MCHC: 31.9 g/dL (ref 30.0–36.0)
MCV: 88.3 fL (ref 78.0–100.0)
MONOS PCT: 9 % (ref 3–12)
Monocytes Absolute: 1.4 10*3/uL — ABNORMAL HIGH (ref 0.1–1.0)
NEUTROS ABS: 12.8 10*3/uL — AB (ref 1.7–7.7)
Neutrophils Relative %: 77 % (ref 43–77)
Platelets: 177 10*3/uL (ref 150–400)
RBC: 4.36 MIL/uL (ref 3.87–5.11)
RDW: 14.6 % (ref 11.5–15.5)
WBC: 16.5 10*3/uL — ABNORMAL HIGH (ref 4.0–10.5)

## 2013-11-15 LAB — GLUCOSE, CAPILLARY
Glucose-Capillary: 156 mg/dL — ABNORMAL HIGH (ref 70–99)
Glucose-Capillary: 169 mg/dL — ABNORMAL HIGH (ref 70–99)
Glucose-Capillary: 123 mg/dL — ABNORMAL HIGH (ref 70–99)
Glucose-Capillary: 168 mg/dL — ABNORMAL HIGH (ref 70–99)
Glucose-Capillary: 136 mg/dL — ABNORMAL HIGH (ref 70–99)
Glucose-Capillary: 104 mg/dL — ABNORMAL HIGH (ref 70–99)

## 2013-11-15 LAB — BASIC METABOLIC PANEL
BUN: 30 mg/dL — ABNORMAL HIGH (ref 6–23)
CO2: 36 meq/L — AB (ref 19–32)
CREATININE: 0.6 mg/dL (ref 0.50–1.10)
Calcium: 8.4 mg/dL (ref 8.4–10.5)
Chloride: 99 mEq/L (ref 96–112)
GFR calc Af Amer: >90 mL/min (ref >90)
GFR calc non Af Amer: 89 mL/min — ABNORMAL LOW (ref >90)
Glucose, Bld: 133 mg/dL — ABNORMAL HIGH (ref 70–99)
POTASSIUM: 3.2 meq/L — AB (ref 3.7–5.3)
Sodium: 144 mEq/L (ref 137–147)

## 2013-11-15 LAB — MAGNESIUM: Magnesium: 2.1 mg/dL (ref 1.5–2.5)

## 2013-11-15 LAB — CK TOTAL AND CKMB (NOT AT ARMC)
CK TOTAL: 47 U/L (ref 7–177)
CK, MB: 2.4 ng/mL (ref 0.3–4.0)
RELATIVE INDEX: INVALID (ref 0.0–2.5)

## 2013-11-15 LAB — LACTIC ACID, PLASMA: Lactic Acid, Venous: 0.2 mmol/L — ABNORMAL LOW (ref 0.5–2.2)

## 2013-11-15 LAB — PHOSPHORUS: Phosphorus: 3 mg/dL (ref 2.3–4.6)

## 2013-11-15 MED ORDER — LORAZEPAM 2 MG/ML IJ SOLN
0.5000 mg | INTRAMUSCULAR | Status: DC | PRN
  Administered 2013-11-16: 0.5 mg via INTRAVENOUS
  Filled 2013-11-15: qty 1

## 2013-11-15 MED ORDER — BIOTENE DRY MOUTH MT LIQD
15.0000 mL | OROMUCOSAL | Status: DC
Start: 2013-11-15 — End: 2013-11-16
  Administered 2013-11-15 – 2013-11-16 (×7): 15 mL via OROMUCOSAL

## 2013-11-15 MED ORDER — ACETAMINOPHEN 325 MG PO TABS
650.0000 mg | ORAL_TABLET | Freq: Four times a day (QID) | ORAL | Status: DC | PRN
  Administered 2013-11-15 – 2013-11-16 (×2): 650 mg via ORAL
  Filled 2013-11-15 (×3): qty 2

## 2013-11-15 MED ORDER — POTASSIUM CHLORIDE 20 MEQ/15ML (10%) PO LIQD
40.0000 meq | Freq: Once | ORAL | Status: AC
  Administered 2013-11-15: 40 meq
  Filled 2013-11-15: qty 30

## 2013-11-15 MED ORDER — GABAPENTIN 250 MG/5ML PO SOLN
300.0000 mg | Freq: Every day | ORAL | Status: DC
  Administered 2013-11-16 – 2013-11-17 (×2): 300 mg via ORAL
  Filled 2013-11-15 (×4): qty 6

## 2013-11-15 NOTE — Significant Event (Signed)
Pt c/o headache.  Will order prn tylenol.  Chesley Mires, MD 11/15/2013, 6:46 PM

## 2013-11-15 NOTE — Progress Notes (Addendum)
Came to visit patient at bedside earlier and she was resting on bipap. Will come back at later time. It was thought that patient was already active with [facility]. However, she is not active. Evaluated for [facility] Care Management services. Will continue to follow and speak with patient at beside when appropriate. Will make inpatient RNCM aware.  Marthenia Rolling, MSN- [facility] [phone]

## 2013-11-15 NOTE — Telephone Encounter (Signed)
Pt mvm is full. i will mail a letter/avs  Giving appts for 05/15/14 w/labs@ 10:45am and ov@ 11:15am....td

## 2013-11-15 NOTE — Progress Notes (Signed)
[facility] Progress Note Patient Name: [name] DOB: May 19, 1941 MRN: [id number]  Date of Service  11/15/2013   HPI/Events of Note   Anxiety on BIPAP  eICU Interventions  Low dose ativan   Intervention Category Minor Interventions: Agitation / anxiety - evaluation and management  MCQUAID, DOUGLAS 11/15/2013, 11:47 PM

## 2013-11-15 NOTE — Procedures (Signed)
Extubation Procedure Note  Patient Details:   Name: [name] DOB: 11-18-1940 MRN: [id number]   Airway Documentation:     Evaluation  O2 sats: stable throughout Complications: No apparent complications Patient did tolerate procedure well. Bilateral Breath Sounds: Rhonchi Suctioning: Airway Yes  Johnette Abraham 11/15/2013, 10:21 AM

## 2013-11-15 NOTE — Progress Notes (Signed)
Pt passed SBT of PSV 5/5 and is tolerating well at this time. Pt is more alert and following some commands this morning. No complications noted. RT will monitor.

## 2013-11-15 NOTE — Progress Notes (Signed)
[facility] PCCM   Name: [name] MRN: [id number] DOB: January 10, 1941   LOS 7  Days PCP Unice Cobble, MD Pulmonary: Dr [name]  ADMISSION DATE:  11/08/2013 CONSULTATION DATE:  1/21   REFERRING MD :  Doyle Askew PRIMARY SERVICE: TRH  CHIEF COMPLAINT:  AE COPD  BRIEF PATIENT DESCRIPTION: 73 y/o female with COPD FEV 1L/48% admitted from pulmonary clinic ([facility] patient) with AE COPD and Influenza A (echo 08/21/12 at [facility]  -e f 45%). Baseline described as some anxious at baseline but described as Type A by daughter. Daughter is ER nurse at [employer] On gabapentin qhs for :night terrors" . She is awaiting shoulder surgery    LINES / TUBES: 1/22 ETT>> 1/22 L IJ CVL >>  CULTURES: 11/08/13 - MRSA PCR - neg 1/20 Influenza A positive 1/22 blood >> 1/22 resp culture >> MSSA  ANTIBIOTICS: 1/20 clinda >>1/21 1/21 tamiflu >> 1/22 vanc >>1/25 1/22 zosyn >>1/15 1/25 Doxy  iv (Mssa) >>    SIGNIFICANT EVENTS / STUDIES:  1/20 CT neck> salivary gland swelling due to sialadenitis 1/21 increased WOB, needing BIPAP 11/11/13: Intubated on 1/22  some agitation/anxiety overnight and this morning 11/12/13: Bradycardia with precedex overnight and then resolved. Currently meets SBT criteria but RN feels patient will benefit from more fentanyl prn 11/12/13: ECHO: Extremely poor acoustic windows limit study LV is difficult to see. Overall LVEF appears to be mild to moderately depressed. There is akinesis of the apex. Other walls are difficult to see well. The cavity size was normal 1/27 more awake. Negative 2 litre x 24 hours    SUBJECTIVE/OVERNIGHT/INTERVAL HX Awake and cooperative  VITAL SIGNS: Temp:  [99.1 F (37.3 C)-100.9 F (38.3 C)] 99.1 F (37.3 C) (01/27 0800) Pulse Rate:  [77-100] 92 (01/27 0800) Resp:  [14-30] 23 (01/27 0800) BP: (121-203)/(44-105) 203/79 mmHg (01/27 0903) SpO2:  [89 %-98 %] 94 % (01/27 0903) FiO2 (%):  [35 %] 35 % (01/27 0903) Weight:  [167 lb 1.7 oz (75.8 kg)] 167 lb  1.7 oz (75.8 kg) (01/27 0612) HEMODYNAMICS:   VENTILATOR SETTINGS: Vent Mode:  [-] PRVC FiO2 (%):  [35 %] 35 % Set Rate:  [14 bmp] 14 bmp Vt Set:  [440 mL] 440 mL PEEP:  [5 cmH20] 5 cmH20 Pressure Support:  [5 cmH20] 5 cmH20 Plateau Pressure:  [14 cmH20-26 cmH20] 19 cmH20 INTAKE / OUTPUT: Intake/Output     01/26 0701 - 01/27 0700 01/27 0701 - 01/28 0700   I.V. (mL/kg) 1580.9 (20.9) 60 (0.8)   NG/GT 1330 0   IV Piggyback 500    Total Intake(mL/kg) 3410.9 (45) 60 (0.8)   Urine (mL/kg/hr) 5265 (2.9)    Total Output 5265     Net -1854.1 +60        Stool Occurrence 1 x      PHYSICAL EXAMINATION: General: On vent, awake and cooperative  HEENT: Intubated, no JVD PULM: prolonged exp phase   CV: RRR, no mgr.  AB: BS+, soft, non tender(tube feeds off for possible extubation) large bm Ext: warm, no edema Neuro: on diprivan + prn fentanyl -> calm LABS: PULMONARY  Recent Labs Lab 11/08/13 2055 11/09/13 2230 11/10/13 1245 11/11/13 1050 11/12/13 0513  PHART 7.329* 7.379 7.304* 7.365 7.403  PCO2ART 49.0* 46.7* 56.8* 47.0* 42.7  PO2ART 116.0* 125.0* 64.1* 114.0* 92.6  HCO3 25.0* 26.7* 27.4* 26.2* 26.1*  TCO2 22.5 23.9 24.8 23.5 23.2  O2SAT 97.7 98.1 89.1 97.7 96.6    CBC  Recent Labs Lab 11/13/13 0340  11/14/13 0640 11/15/13 0545  HGB 12.4 11.9* 12.3  HCT 38.4 35.8* 38.5  WBC 17.2* 17.4* 16.5*  PLT 215 179 177    COAGULATION  Recent Labs Lab 11/09/13 0737  INR 1.04    CARDIAC    Recent Labs Lab 11/12/13 1030 11/12/13 1525 11/12/13 2130 11/13/13 0340  TROPONINI <0.30 <0.30 <0.30 <0.30    Recent Labs Lab 11/08/13 1715 11/15/13 0545  PROBNP 199.9* 969.8*     CHEMISTRY  Recent Labs Lab 11/11/13 0439 11/12/13 0408 11/13/13 0340 11/14/13 0640 11/15/13 0545  NA 137 137 137 142 144  K 4.3 3.7 4.3 4.2 3.2*  CL 100 102 101 100 99  CO2 27 25 26  33* 36*  GLUCOSE 195* 223* 190* 141* 133*  BUN 30* 34* 28* 26* 30*  CREATININE 0.60 0.59 0.59  0.57 0.60  CALCIUM 8.5 8.4 8.2* 8.5 8.4  MG  --   --  2.2 2.1 2.1  PHOS  --   --  2.3 3.2 3.0   Estimated Creatinine Clearance: 63.3 ml/min (by C-G formula based on Cr of 0.6).   LIVER  Recent Labs Lab 11/09/13 0737  AST 34  ALT 38*  ALKPHOS 127*  BILITOT 0.3  PROT 6.9  ALBUMIN 3.4*  INR 1.04     INFECTIOUS  Recent Labs Lab 11/15/13 0545  LATICACIDVEN 0.2*     ENDOCRINE CBG (last 3)   Recent Labs  11/14/13 2318 11/15/13 0310 11/15/13 0758  GLUCAP 156* 169* 123*         IMAGING x48h  Dg Chest Port 1 View  11/15/2013   CLINICAL DATA:  Intubation.  EXAM: PORTABLE CHEST - 1 VIEW  COMPARISON:  Chest x-ray 11/14/2013.  FINDINGS: Endotracheal tube 1.7 cm above the carina. Slight withdrawal may prove useful. Left IJ line in good anatomic position. NG tube in good anatomic position. Stable cardiomegaly with mild pulmonary vascular prominence and interstitial prominence noted. No pleural effusion or pneumothorax. Underlying COPD. Left shoulder replacement.  IMPRESSION: 1. Endotracheal tube 1.7 cm above the carina. Slight proximal repositioning may prove useful. 2. Central line and NG tube in stable position. 3. Persistent unchanged pulmonary venous congestion and interstitial edema.   Electronically Signed   By: Marcello Moores  Register   On: 11/15/2013 07:57   Dg Chest Port 1 View  11/14/2013   CLINICAL DATA:  Evaluate endotracheal tube position.  EXAM: PORTABLE CHEST - 1 VIEW  COMPARISON:  DG CHEST 1V PORT dated 11/13/2013  FINDINGS: Endotracheal tube unchanged with 1 4 cm above carina. Nasogastric extends beyond the inferior aspect of the film. Left-sided internal jugular line terminates at low SVC.  Underline hyperinflation/ COPD. Normal heart size. No pleural effusion or pneumothorax. Improved interstitial edema, with asymmetric pulmonary venous congestion remaining. Greater on the left.  IMPRESSION: COPD/chronic bronchitis with improved pulmonary venous congestion.  Stable  position of endotracheal tube.   Electronically Signed   By: Abigail Miyamoto M.D.   On: 11/14/2013 08:10       ASSESSMENT / PLAN:  PULMONARY A:  Severe Acute Exacerbation of COPD due to influenza A HCAP with worsening CXR findings Acute hypoxemic respiratory failure due to COPD exacerbation Hemoptysis> likely due to influenza, mild     P:   - SBTas tolerated consider extubation 1-27 -full vent support -duoneb scheduled q4h -maintain steroids to q12; reduced dose -may need to hold plavix if hemoptysis recurrs   Cardiovascular: A: Hemodynamically stable Hypertension CAD, DES 2013 EF 45% at [facility] in 2013  Intake/Output Summary (Last 24 hours) at 11/15/13 7096 Last data filed at 11/15/13 0800  Gross per 24 hour  Intake 3287.08 ml  Output   5265 ml  Net -1977.92 ml      P: - continue diprivan if not extubated - reduce steroid dose - hydralaizine prn - changed loperssor to bisoprolol - see CNS section for anxiety mgmt --continue plavix for now - check troponin (<.30), bnp(969) and echo - lasix to continue at Q12h(replete K+ as needed)  GASTROINTESTINAL A:  Constipation>resolved Sialadenitis > resolved P:   -monitor neck exam -add senna(stop) -continue probiotic and colace  INFECTIOUS A:  Influenza A HCAP > MSSA P:   -tamiflu contiue -dc vanc, zosyn Started IV Doxy see flows   Neuro: A: Anxiety/agitation 11/13/13: Failed precedex 11/12/13. Needing diprivan. Hx of Type A personality and mild anxity. 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 SBT today but careful with extubation decision.  09/23/13: Daughter [name]  [phone] updated over phone from bedsie 1/26 no exutbation  Daughter  [name] updated over phone 1/27 attempt extubation  [place] PCCM Pager [phone] till 3 pm If no answer page [phone] 11/15/2013, 9:05 AM    STAFF NOTE: I, Dr Ann Lions have personally reviewed patient's available data, including medical history, events of note, physical examination and test results as part of my evaluation. I have discussed with resident/NP and other care providers such as pharmacist, RN and RRT.  In addition,  I personally evaluated patient and elicited key findings of  Acute resp failure due to severe AECOPD/Pna. Extubate to bipap today. Still some ativan residual effect. Doing well on bipap. continuje NPO. Daughter [name] updated at bedside. Improved but not out of woods.  Rest per NP/medical resident whose note is outlined above and that I agree with  The patient is critically ill with multiple organ systems failure and requires high complexity decision making for assessment and support, frequent evaluation and titration of therapies, application of advanced monitoring technologies and extensive interpretation of multiple databases.   Critical Care Time devoted to patient care services described in this note is  35  Minutes.  Dr. Brand Males, M.D., [facility].C.P Pulmonary and Critical Care Medicine Staff Physician [place] [facility] Pager: [phone], If no answer or between  15:00h - 7:00h: call [phone]  11/15/2013 12:10 PM

## 2013-11-16 ENCOUNTER — Inpatient Hospital Stay (HOSPITAL_COMMUNITY): Payer: Medicare Other

## 2013-11-16 DIAGNOSIS — I472 Ventricular tachycardia, unspecified: Secondary | ICD-10-CM

## 2013-11-16 DIAGNOSIS — I4729 Other ventricular tachycardia: Secondary | ICD-10-CM

## 2013-11-16 LAB — GLUCOSE, CAPILLARY
GLUCOSE-CAPILLARY: 104 mg/dL — AB (ref 70–99)
GLUCOSE-CAPILLARY: 127 mg/dL — AB (ref 70–99)
GLUCOSE-CAPILLARY: 140 mg/dL — AB (ref 70–99)
GLUCOSE-CAPILLARY: 85 mg/dL (ref 70–99)
GLUCOSE-CAPILLARY: 99 mg/dL (ref 70–99)
Glucose-Capillary: 107 mg/dL — ABNORMAL HIGH (ref 70–99)

## 2013-11-16 LAB — CBC WITH DIFFERENTIAL/PLATELET
BASOS PCT: 0 % (ref 0–1)
Basophils Absolute: 0 10*3/uL (ref 0.0–0.1)
Eosinophils Absolute: 0 10*3/uL (ref 0.0–0.7)
Eosinophils Relative: 0 % (ref 0–5)
HEMATOCRIT: 36.1 % (ref 36.0–46.0)
Hemoglobin: 11.4 g/dL — ABNORMAL LOW (ref 12.0–15.0)
Lymphocytes Relative: 13 % (ref 12–46)
Lymphs Abs: 1.5 10*3/uL (ref 0.7–4.0)
MCH: 28.2 pg (ref 26.0–34.0)
MCHC: 31.6 g/dL (ref 30.0–36.0)
MCV: 89.4 fL (ref 78.0–100.0)
Monocytes Absolute: 0.8 10*3/uL (ref 0.1–1.0)
Monocytes Relative: 7 % (ref 3–12)
NEUTROS ABS: 9.2 10*3/uL — AB (ref 1.7–7.7)
NEUTROS PCT: 80 % — AB (ref 43–77)
Platelets: 180 10*3/uL (ref 150–400)
RBC: 4.04 MIL/uL (ref 3.87–5.11)
RDW: 14.5 % (ref 11.5–15.5)
WBC: 11.5 10*3/uL — ABNORMAL HIGH (ref 4.0–10.5)

## 2013-11-16 LAB — BASIC METABOLIC PANEL
BUN: 32 mg/dL — ABNORMAL HIGH (ref 6–23)
BUN: 33 mg/dL — AB (ref 6–23)
BUN: 31 mg/dL — AB (ref 6–23)
CHLORIDE: 97 meq/L (ref 96–112)
CHLORIDE: 101 meq/L (ref 96–112)
CO2: 37 meq/L — AB (ref 19–32)
CO2: 36 meq/L — AB (ref 19–32)
CO2: 35 meq/L — AB (ref 19–32)
CREATININE: 0.66 mg/dL (ref 0.50–1.10)
Calcium: 8.5 mg/dL (ref 8.4–10.5)
Calcium: 9 mg/dL (ref 8.4–10.5)
Calcium: 8.9 mg/dL (ref 8.4–10.5)
Chloride: 97 mEq/L (ref 96–112)
Creatinine, Ser: 0.65 mg/dL (ref 0.50–1.10)
Creatinine, Ser: 0.68 mg/dL (ref 0.50–1.10)
GFR calc Af Amer: >90 mL/min (ref >90)
GFR calc Af Amer: >90 mL/min (ref >90)
GFR calc Af Amer: >90 mL/min (ref >90)
GFR calc non Af Amer: 87 mL/min — ABNORMAL LOW (ref >90)
GFR calc non Af Amer: 85 mL/min — ABNORMAL LOW (ref >90)
GFR calc non Af Amer: 86 mL/min — ABNORMAL LOW (ref >90)
GLUCOSE: 127 mg/dL — AB (ref 70–99)
Glucose, Bld: 113 mg/dL — ABNORMAL HIGH (ref 70–99)
Glucose, Bld: 113 mg/dL — ABNORMAL HIGH (ref 70–99)
POTASSIUM: 3.6 meq/L — AB (ref 3.7–5.3)
POTASSIUM: 3.6 meq/L — AB (ref 3.7–5.3)
Potassium: 4.4 mEq/L (ref 3.7–5.3)
SODIUM: 141 meq/L (ref 137–147)
SODIUM: 142 meq/L (ref 137–147)
Sodium: 142 mEq/L (ref 137–147)

## 2013-11-16 LAB — CULTURE, BLOOD (ROUTINE X 2)
CULTURE: NO GROWTH
Culture: NO GROWTH

## 2013-11-16 LAB — TROPONIN I
Troponin I: <0.3 ng/mL (ref <0.30)
Troponin I: <0.3 ng/mL (ref <0.30)

## 2013-11-16 LAB — PROCALCITONIN

## 2013-11-16 LAB — PHOSPHORUS: PHOSPHORUS: 4 mg/dL (ref 2.3–4.6)

## 2013-11-16 LAB — MAGNESIUM
MAGNESIUM: 2.2 mg/dL (ref 1.5–2.5)
Magnesium: 2.2 mg/dL (ref 1.5–2.5)

## 2013-11-16 MED ORDER — PANTOPRAZOLE SODIUM 40 MG PO TBEC
40.0000 mg | DELAYED_RELEASE_TABLET | Freq: Every day | ORAL | Status: DC
  Administered 2013-11-16 – 2013-11-18 (×3): 40 mg via ORAL
  Filled 2013-11-16 (×3): qty 1

## 2013-11-16 MED ORDER — VITAMINS A & D EX OINT
TOPICAL_OINTMENT | CUTANEOUS | Status: AC
  Administered 2013-11-16: 1
  Filled 2013-11-16: qty 10

## 2013-11-16 MED ORDER — POTASSIUM CHLORIDE 10 MEQ/50ML IV SOLN
INTRAVENOUS | Status: AC
  Filled 2013-11-16: qty 100

## 2013-11-16 MED ORDER — POTASSIUM CHLORIDE CRYS ER 20 MEQ PO TBCR
40.0000 meq | EXTENDED_RELEASE_TABLET | Freq: Once | ORAL | Status: AC
  Administered 2013-11-16: 40 meq via ORAL
  Filled 2013-11-16 (×2): qty 2

## 2013-11-16 MED ORDER — ACETAMINOPHEN-CODEINE #3 300-30 MG PO TABS
1.0000 | ORAL_TABLET | Freq: Four times a day (QID) | ORAL | Status: DC | PRN
  Administered 2013-11-16: 1 via ORAL
  Filled 2013-11-16: qty 1

## 2013-11-16 MED ORDER — METHYLPREDNISOLONE SODIUM SUCC 40 MG IJ SOLR
40.0000 mg | Freq: Every day | INTRAMUSCULAR | Status: DC
  Administered 2013-11-16 – 2013-11-17 (×2): 40 mg via INTRAVENOUS
  Filled 2013-11-16: qty 1

## 2013-11-16 MED ORDER — POTASSIUM CHLORIDE CRYS ER 20 MEQ PO TBCR: 20.0000 meq | EXTENDED_RELEASE_TABLET | ORAL | Status: DC

## 2013-11-16 MED ORDER — POTASSIUM CHLORIDE CRYS ER 20 MEQ PO TBCR
40.0000 meq | EXTENDED_RELEASE_TABLET | Freq: Once | ORAL | Status: AC
  Administered 2013-11-16: 40 meq via ORAL
  Filled 2013-11-16: qty 2

## 2013-11-16 MED ORDER — LORAZEPAM 2 MG/ML IJ SOLN: 1.0000 mg | Freq: Once | INTRAMUSCULAR | Status: DC

## 2013-11-16 MED ORDER — CHLORHEXIDINE GLUCONATE 0.12 % MT SOLN
15.0000 mL | Freq: Two times a day (BID) | OROMUCOSAL | Status: DC
  Administered 2013-11-16 – 2013-11-17 (×2): 15 mL via OROMUCOSAL
  Filled 2013-11-16 (×3): qty 15

## 2013-11-16 MED ORDER — BIOTENE DRY MOUTH MT LIQD
15.0000 mL | Freq: Two times a day (BID) | OROMUCOSAL | Status: DC
  Administered 2013-11-16 (×2): 15 mL via OROMUCOSAL

## 2013-11-16 MED ORDER — POTASSIUM CHLORIDE 10 MEQ/50ML IV SOLN
10.0000 meq | INTRAVENOUS | Status: AC
  Administered 2013-11-16 (×2): 10 meq via INTRAVENOUS

## 2013-11-16 NOTE — Progress Notes (Signed)
NUTRITION FOLLOW UP  Intervention:   - Diet advancement per MD - Resource Breeze BID - Will continue to monitor   Nutrition Dx:   Inadequate oral intake related to inability to eat as evidenced by NPO, mechanical ventilation - no longer appropriate, diet advanced  New nutrition dx: Inadequate oral intake related to clear liquid diet as evidenced diet order.    Goal:   TF to meet >90% of estimated nutritional needs - not met, pt no longer on TF  New goal: Advance diet as tolerated to regular diet   Monitor:   Weights, labs, diet advancement  Assessment:   Admitted with shortness of breath going on since Wednseday, found to have COPD exacerbation. Hx of COPD, gastric ulcer, diverticulitis, hyperlipidemia, GERD, TIA, HTN, CAD, CVA, and breast CA. Also with sialadenitis, constipation, and influenza A. Pt with progressive respiratory failure requiring intubation today.   1/22 - Pt alone in room. Received consult for TF management. Started TF via OGT of Vital AF 1.2 with goal of 62m/hr. Goal rate will provide 1440 calories, 90g protein, and 9734mfree water and meet 99% estimated calorie needs and 100% estimated protein needs.  1/27 - Pt extubated. TF d/c.   1/28 - Pt's weight down 7 pounds since admission. Pt getting Lasix. Edema improving per RN charting. Met with pt who reports tolerating clear liquid diet. Said she was eating well PTA.   Potassium low, getting IV and oral replacement   Height: Ht Readings from Last 1 Encounters:  11/12/13 5' 4"  (1.626 m)    Weight Status:   Wt Readings from Last 1 Encounters:  11/16/13 158 lb 1.1 oz (71.7 kg)  Admit wt:        165 lb 2 oz (74.9 kg)  Re-estimated needs:  Kcal: 1600-1800 Protein: 75-90g Fluid: 1.6-1.8L/day  Skin: +1 RUE, LUE edema  Diet Order: NPO   Intake/Output Summary (Last 24 hours) at 11/16/13 0954 Last data filed at 11/16/13 0810  Gross per 24 hour  Intake   1420 ml  Output   2775 ml  Net  -1355 ml     Last BM: 1/28   Labs:   Recent Labs Lab 11/14/13 0640 11/15/13 0545 11/16/13 0445  NA 142 144 141  K 4.2 3.2* 3.6*  CL 100 99 97  CO2 33* 36* 37*  BUN 26* 30* 32*  CREATININE 0.57 0.60 0.65  CALCIUM 8.5 8.4 8.5  MG 2.1 2.1 2.2  PHOS 3.2 3.0 4.0  GLUCOSE 141* 133* 113*    CBG (last 3)   Recent Labs  11/16/13 0027 11/16/13 0400 11/16/13 0736  GLUCAP 140* 107* 85    Scheduled Meds: . antiseptic oral rinse  15 mL Mouth Rinse Q4H  . aspirin  81 mg Per Tube q1800  . bisoprolol  20 mg Oral Daily  . chlorhexidine  15 mL Mouth Rinse BID  . clopidogrel  75 mg Oral Daily  . doxycycline (VIBRAMYCIN) IV  100 mg Intravenous Q12H  . enoxaparin (LOVENOX) injection  40 mg Subcutaneous QHS  . furosemide  20 mg Intravenous Q12H  . gabapentin  300 mg Oral QHS  . ipratropium-albuterol  3 mL Nebulization Q4H  . methylPREDNISolone (SOLU-MEDROL) injection  40 mg Intravenous Daily  . oseltamivir  75 mg Per Tube BID  . pantoprazole sodium  40 mg Per Tube Daily  . potassium chloride  40 mEq Oral Once  . sodium chloride  3 mL Intravenous Q12H    HeMikey CollegeS, RD,  LDN [phone] Pager [phone] After Hours Pager

## 2013-11-16 NOTE — Progress Notes (Signed)
Took pt off Bi-PAP and placed on 6lpm nasal cannula. RN notified. RT will continue to monitor.

## 2013-11-16 NOTE — Significant Event (Signed)
Pt c/o headache.  She has tylenol #3 at home.  Will re-order this.  Chesley Mires, MD 11/16/2013, 5:00 PM

## 2013-11-16 NOTE — Progress Notes (Signed)
[facility] PCCM   Name: [name] MRN: [id number] DOB: 1940/12/31   LOS 8  Days PCP Unice Cobble, MD Pulmonary: Dr [name]  ADMISSION DATE:  11/08/2013 CONSULTATION DATE:  1/21   REFERRING MD :  Doyle Askew PRIMARY SERVICE: TRH  CHIEF COMPLAINT:  AE COPD  BRIEF PATIENT DESCRIPTION: 73 y/o female with COPD FEV 1L/48% admitted from pulmonary clinic ([facility] patient) with AE COPD and Influenza A (echo 08/21/12 at [facility]  -e f 45%). Baseline described as some anxious at baseline but described as Type A by daughter. Daughter is ER nurse at [employer] On gabapentin qhs for :night terrors" . She is awaiting shoulder surgery   has a past medical history of Other emphysema; Unspecified arthropathy, shoulder region; Gastric ulcer, unspecified as acute or chronic, without mention of hemorrhage, perforation, or obstruction; Benign paroxysmal positional vertigo; COPD (chronic obstructive pulmonary disease); Diverticula of colon; Osteopenia; Personal history of colonic polyps (2007); Diverticulitis; Hyperlipemia; Fracture of ankle, bimalleolar, right, closed; Hypoxemia; C. 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   has past surgical history that includes Total shoulder arthroplasty (2012); ORIF ankle fracture (2007); martial megeti (8546'E); Varicose vein surgery (7035; 1985); Total shoulder replacement (12/05/2010); Abdominal hysterectomy; Augmentation mammaplasty (1975); Coronary angioplasty with stent (2010); Coronary angioplasty (1990); Colon surgery (07/07/2011); Appendectomy (1960); Nose surgery; BREAST  AUGMENTTATION; CATARCT  RIGHT; Breast lumpectomy with needle localization and axillary sentinel lymph node bx (Left, 06/02/2013); and Breast implant removal (Left, 06/02/2013).    LINES / TUBES: 1/22 ETT>>1/27 1/22 L IJ CVL >>  CULTURES: 11/08/13 - MRSA PCR - neg 1/20 Influenza A positive 1/22 blood >>neg 1/22 resp culture >> MSSA  ANTIBIOTICS: 1/20 clinda >>1/21, 1/25 Doxy  iv (Mssa) >> 1/21 tamiflu >>1/28 1/22 vanc >>1/25 1/22 zosyn >>1/15     SIGNIFICANT EVENTS / STUDIES:  1/20 CT neck> salivary gland swelling due to sialadenitis 1/21 increased WOB, needing BIPAP 11/11/13: Intubated on 1/22  some agitation/anxiety overnight and this morning 11/12/13: Bradycardia with precedex overnight and then resolved. Currently meets SBT criteria but RN feels patient will benefit from more fentanyl prn 11/12/13: ECHO: Extremely poor acoustic windows limit study LV is difficult to see. Overall LVEF appears to be mild to moderately depressed. There is akinesis of the apex. Other walls are difficult to see well. The cavity size was normal 1/27 more awake. Negative 2 litre x 24 hours. Extubated    SUBJECTIVE/OVERNIGHT/INTERVAL HX Awake and cooperative  11/16/13 STaff MD note: at 11am 12 beat VTach and then resolved  VITAL SIGNS: Temp:  [98.4 F (36.9 C)-99.5 F (37.5 C)] 99.3 F (37.4 C) (01/28 0600) Pulse Rate:  [64-99] 70 (01/28 0600) Resp:  [10-31] 12 (01/28 0600) BP: (138-203)/(27-79) 146/47 mmHg (01/28 0600) SpO2:  [87 %-100 %] 97 % (01/28 0809) FiO2 (%):  [35 %] 35 % (01/28 0021) Weight:  [158 lb 1.1 oz (71.7 kg)] 158 lb 1.1 oz (71.7 kg) (01/28 0400) HEMODYNAMICS:   VENTILATOR SETTINGS: Vent Mode:  [-] BIPAP FiO2 (%):  [35 %] 35 % Set Rate:  [14 bmp] 14 bmp PEEP:  [5 cmH20] 5 cmH20 Pressure Support:  [5 cmH20] 5 cmH20 INTAKE / OUTPUT: Intake/Output     01/27 0701 - 01/28  0700 01/28 0701 - 01/29 0700   I.V. (mL/kg) 860 (12)    NG/GT 0    IV Piggyback 500    Total  Intake(mL/kg) 1360 (19)    Urine (mL/kg/hr) 2635 (1.5)    Total Output 2635     Net -1275          Stool Occurrence 2 x      PHYSICAL EXAMINATION: General:awake and cooperative , weak HEENT: no JVD PULM: mild wheeze, decreased air movment   CV: RRR, no mgr.  AB: BS+, soft, non tender(tube feeds off for possible extubation) large bm Ext: warm, no edema Neuro: follows commands, mae x 4, somewhat stunned  LABS: PULMONARY  Recent Labs Lab 11/09/13 2230 11/10/13 1245 11/11/13 1050 11/12/13 0513  PHART 7.379 7.304* 7.365 7.403  PCO2ART 46.7* 56.8* 47.0* 42.7  PO2ART 125.0* 64.1* 114.0* 92.6  HCO3 26.7* 27.4* 26.2* 26.1*  TCO2 23.9 24.8 23.5 23.2  O2SAT 98.1 89.1 97.7 96.6    CBC  Recent Labs Lab 11/14/13 0640 11/15/13 0545 11/16/13 0445  HGB 11.9* 12.3 11.4*  HCT 35.8* 38.5 36.1  WBC 17.4* 16.5* 11.5*  PLT 179 177 180    COAGULATION No results found for this basename: INR,  in the last 168 hours  CARDIAC    Recent Labs Lab 11/12/13 1030 11/12/13 1525 11/12/13 2130 11/13/13 0340  TROPONINI <0.30 <0.30 <0.30 <0.30    Recent Labs Lab 11/15/13 0545  PROBNP 969.8*     CHEMISTRY  Recent Labs Lab 11/12/13 0408 11/13/13 0340 11/14/13 0640 11/15/13 0545 11/16/13 0445  NA 137 137 142 144 141  Jamie 3.7 4.3 4.2 3.2* 3.6*  CL 102 101 100 99 97  CO2 25 26 33* 36* 37*  GLUCOSE 223* 190* 141* 133* 113*  BUN 34* 28* 26* 30* 32*  CREATININE 0.59 0.59 0.57 0.60 0.65  CALCIUM 8.4 8.2* 8.5 8.4 8.5  MG  --  2.2 2.1 2.1 2.2  PHOS  --  2.3 3.2 3.0 4.0   Estimated Creatinine Clearance: 61.7 ml/min (by C-G formula based on Cr of 0.65).   LIVER No results found for this basename: AST, ALT, ALKPHOS, BILITOT, PROT, ALBUMIN, INR,  in the last 168 hours   INFECTIOUS  Recent Labs Lab 11/15/13 0545  LATICACIDVEN 0.2*     ENDOCRINE CBG (last 3)   Recent Labs  11/16/13 0027 11/16/13 0400 11/16/13 0736  GLUCAP 140* 107* 85         IMAGING  x48h  Dg Chest Port 1 View  11/16/2013   CLINICAL DATA:  Edema.  EXAM: PORTABLE CHEST - 1 VIEW  COMPARISON:  11/15/2013  FINDINGS: Endotracheal tube and nasogastric to have been removed. Left internal jugular central venous line is stable.  There is bilateral irregular interstitial thickening that is unchanged from the previous day's study. There is relative sparing of the right upper lobe likely from emphysema. No focal consolidation. No pleural effusion or pneumothorax.  Cardiac silhouette is normal in size. Mediastinum is normal in caliber.  IMPRESSION: Findings support mild interstitial edema superimposed on chronic interstitial thickening and emphysema. Lung appearance is stable from the previous day's study. Status post extubation.   Electronically Signed   By: Lajean Manes M.D.   On: 11/16/2013 08:10   Dg Chest Port 1 View  11/15/2013   CLINICAL DATA:  Intubation.  EXAM: PORTABLE CHEST - 1 VIEW  COMPARISON:  Chest x-ray 11/14/2013.  FINDINGS: Endotracheal tube 1.7 cm above the carina. Slight withdrawal may prove  useful. Left IJ line in good anatomic position. NG tube in good anatomic position. Stable cardiomegaly with mild pulmonary vascular prominence and interstitial prominence noted. No pleural effusion or pneumothorax. Underlying COPD. Left shoulder replacement.  IMPRESSION: 1. Endotracheal tube 1.7 cm above the carina. Slight proximal repositioning may prove useful. 2. Central line and NG tube in stable position. 3. Persistent unchanged pulmonary venous congestion and interstitial edema.   Electronically Signed   By: Marcello Moores  Register   On: 11/15/2013 07:57     Intake/Output Summary (Last 24 hours) at 11/16/13 0855 Last data filed at 11/16/13 0600  Gross per 24 hour  Intake   1300 ml  Output   2635 ml  Net  -1335 ml      ASSESSMENT / PLAN:  PULMONARY A:  Severe Acute Exacerbation of COPD due to influenza A/ s/p exttubation 11/15/13 HCAP with worsening CXR findings Acute hypoxemic  respiratory failure due to COPD exacerbation Hemoptysis> likely due to influenza, mild  11/16/13: stable and improving.    P:   -duoneb scheduled q4h -maintain steroids to q12; reduced dose -may need to hold plavix if hemoptysis reoccurs -OOB and PT to mobilize   Cardiovascular: A: Hemodynamically stable Hypertension CAD, DES 2013 EF 45% at [facility] in 2013  Intake/Output Summary (Last 24 hours) at 11/16/13 0850 Last data filed at 11/16/13 0600  Gross per 24 hour  Intake   1300 ml  Output   2635 ml  Net  -1335 ml    11/16/13: 12 beat V tach at 11am (staff MD note)  P: - rule out MI again 11/16/13, recheck mag and Jamie, repelte Jamie - reduce steroid dose (40 mg qd 1/28) - hydralaizine prn - changed loperssor to bisoprolol - see CNS section for anxiety mgmt --continue plavix for now - check troponin (<.30), bnp(969) and echo - lasix to continue at Q12h(replete Jamie+ as needed)  GASTROINTESTINAL A:  Constipation>resolved Sialadenitis > resolved P:   -monitor neck exam -add senna(stop) -continue probiotic and colace(stop for diarrhea) -advance diet  INFECTIOUS A:  Influenza A HCAP > MSSA P:   -tamiflu completed -dc vanc, zosyn Started IV Doxy see flows - recheck PCT to decide abx stopd date (staff MD comment)   Neuro: A: Anxiety/agitation 11/13/13: Failed precedex 11/12/13. Needing diprivan. Hx of Type A personality and mild anxity. 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 SBT today but careful with extubation decision.  09/23/13: Daughter Janene Harvey  [phone] updated over phone from bedsie 1/26 no exutbation  Daughter Caryl Pina updated over phone 1/27 extubation. Daughter Caryl Pina updated at bedside by Staff MD 1/28 mobilize. Duaghter updated at bedside by Staff MD. She is [facility] eligible  but need to get her more stable and need to check with daughter if this is their goal  [facility] ACNP Maryanna Shape PCCM Pager [phone] till 3 pm If no answer page [phone] 11/16/2013, 8:50 AM   STAFF MD - see my notes. Had NSVTach. Recheck  Enzymes Change to SDU status. / Keep on PCCM   Dr. Brand Males, M.D., [facility].C.P Pulmonary and Critical Care Medicine Staff Physician [place] [facility] Pager: [phone], If no answer or between  15:00h - 7:00h: call [phone]  11/16/2013 11:47 AM

## 2013-11-16 NOTE — Progress Notes (Signed)
12 beat run V Tach. PCCM aware.

## 2013-11-16 NOTE — Progress Notes (Signed)
[facility] ADULT ICU REPLACEMENT PROTOCOL FOR AM LAB REPLACEMENT ONLY  The patient does apply for the [facility] Adult ICU Electrolyte Replacment Protocol based on the criteria listed below:   1. Is GFR >/= 40 ml/min? yes  Patient's GFR today is 87 2. Is urine output >/= 0.5 ml/kg/hr for the last 6 hours? yes Patient's UOP is 1.4 ml/kg/hr 3. Is BUN < 60 mg/dL? yes  Patient's BUN today is 32 4. Abnormal electrolyte(s):K 3.6 5. Ordered repletion with: per protocol 6. If a panic level lab has been reported, has the CCM MD in charge been notified? yes.   Physician:  Dr Philbert Riser 11/16/2013 6:27 AM

## 2013-11-16 NOTE — Evaluation (Signed)
Physical Therapy Evaluation Patient Details Name: [name] MRN: [id number] DOB: 23-Feb-1941 Today's Date: 11/16/2013 Time: 9735-3299 PT Time Calculation (min): 23 min  PT Assessment / Plan / Recommendation History of Present Illness  [name] is a 73 y.o. female with Past medical history of COPD, GERD, CVA, CAD status post PCI, hypertension, breast cancer, pt admitted with exacerbation, VDRF 1/22-1/27.  Clinical Impression  Pt  Is very weak but able to sit at edge of bed and stand x 2. Pt will benefit from PT to address problems listed below to improve functional mobility  . Pt will benefit from SNF.    PT Assessment  Patient needs continued PT services    Follow Up Recommendations  SNF    Does the patient have the potential to tolerate intense rehabilitation      Barriers to Discharge Decreased caregiver support      Equipment Recommendations  None recommended by PT    Recommendations for Other Services     Frequency Min 3X/week    Precautions / Restrictions Precautions Precautions: Fall Precaution Comments: monitor sats   Pertinent Vitals/Pain HR 70's, sats >95% 5 l..       Mobility  Bed Mobility Overal bed mobility: +2 for physical assistance;+ 2 for safety/equipment;Needs Assistance Bed Mobility: Rolling;Supine to Sit;Sit to Supine Rolling: +2 for physical assistance;+2 for safety/equipment;Max assist Supine to sit: Total assist;+2 for physical assistance;+2 for safety/equipment Sit to supine: +2 for safety/equipment;+2 for physical assistance;Total assist General bed mobility comments: HOB raised and pad used to get pt to edge of bed and lifting assist to right trunk.Pt required  total assist to get back to supine and legs onto bed. Transfers Overall transfer level: Needs assistance Equipment used: 2 person hand held assist Transfers: Sit to/from Stand Sit to Stand: +2 physical assistance;+2 safety/equipment;Max assist General transfer comment: pt   partially stood x 2, attempted to  side step but only took 1 step. Legs buckling.    Exercises     PT Diagnosis: Difficulty walking;Generalized weakness;Acute pain  PT Problem List: Decreased strength;Decreased range of motion;Decreased activity tolerance;Decreased balance;Decreased mobility;Decreased knowledge of use of DME;Decreased safety awareness;Decreased knowledge of precautions;Cardiopulmonary status limiting activity;Pain PT Treatment Interventions: DME instruction;Gait training;Functional mobility training;Therapeutic activities;Therapeutic exercise;Patient/family education     PT Goals(Current goals can be found in the care plan section) Acute Rehab PT Goals Patient Stated Goal: i need to go to  PT Goal Formulation: With patient Time For Goal Achievement: 11/30/13 Potential to Achieve Goals: Good  Visit Information  Last PT Received On: 11/16/13 Assistance Needed: +2 History of Present Illness: [name] is a 73 y.o. female with Past medical history of COPD, GERD, CVA, CAD status post PCI, hypertension, breast cancer, pt admitted with exacerbation, VDRF 1/22-1/27.       Prior [place] expects to be discharged to:: Skilled nursing facility Communication Communication: No difficulties    Cognition  Cognition Arousal/Alertness: Awake/alert Behavior During Therapy: Restless Overall Cognitive Status: Within Functional Limits for tasks assessed    Extremity/Trunk Assessment Upper Extremity Assessment Upper Extremity Assessment: Generalized weakness Lower Extremity Assessment Lower Extremity Assessment: Generalized weakness Cervical / Trunk Assessment Cervical / Trunk Assessment: Normal   Balance Balance Overall balance assessment: Needs assistance Sitting-balance support: Bilateral upper extremity supported Sitting balance-Leahy Scale: Fair Sitting balance - Comments: sat x 4 minutes.  End of Session PT - End of  Session Activity Tolerance: Patient limited by fatigue;Treatment limited secondary to medical complications (Comment)  Patient left: in bed;with call bell/phone within reach;with bed alarm set Nurse Communication: Mobility status  GP     Claretha Cooper 11/16/2013, 4:03 PM Tresa Endo PT [phone]

## 2013-11-16 NOTE — Significant Event (Signed)
Pt requiring BiPAP at night.  She is anxious about using mask.  Will give ativan 1 mg IV x one prior to starting BiPAP.  Chesley Mires, MD 11/16/2013, 8:14 PM

## 2013-11-17 ENCOUNTER — Inpatient Hospital Stay (HOSPITAL_COMMUNITY): Admission: RE | Admit: 2013-11-17 | Payer: Medicare Other | Source: Ambulatory Visit | Admitting: Orthopedic Surgery

## 2013-11-17 ENCOUNTER — Inpatient Hospital Stay (HOSPITAL_COMMUNITY): Payer: Medicare Other

## 2013-11-17 ENCOUNTER — Encounter (HOSPITAL_COMMUNITY): Admission: RE | Payer: Self-pay | Source: Ambulatory Visit

## 2013-11-17 DIAGNOSIS — G478 Other sleep disorders: Secondary | ICD-10-CM

## 2013-11-17 LAB — BASIC METABOLIC PANEL
BUN: 31 mg/dL — ABNORMAL HIGH (ref 6–23)
CALCIUM: 8.6 mg/dL (ref 8.4–10.5)
CO2: 34 mEq/L — ABNORMAL HIGH (ref 19–32)
CREATININE: 0.82 mg/dL (ref 0.50–1.10)
Chloride: 101 mEq/L (ref 96–112)
GFR, EST AFRICAN AMERICAN: 81 mL/min — AB (ref >90)
GFR, EST NON AFRICAN AMERICAN: 70 mL/min — AB (ref >90)
Glucose, Bld: 92 mg/dL (ref 70–99)
Potassium: 4.1 mEq/L (ref 3.7–5.3)
SODIUM: 144 meq/L (ref 137–147)

## 2013-11-17 LAB — CBC
HCT: 35.7 % — ABNORMAL LOW (ref 36.0–46.0)
Hemoglobin: 11.3 g/dL — ABNORMAL LOW (ref 12.0–15.0)
MCH: 28.5 pg (ref 26.0–34.0)
MCHC: 31.7 g/dL (ref 30.0–36.0)
MCV: 89.9 fL (ref 78.0–100.0)
PLATELETS: 172 10*3/uL (ref 150–400)
RBC: 3.97 MIL/uL (ref 3.87–5.11)
RDW: 14.4 % (ref 11.5–15.5)
WBC: 9.5 10*3/uL (ref 4.0–10.5)

## 2013-11-17 LAB — GLUCOSE, CAPILLARY
Glucose-Capillary: 120 mg/dL — ABNORMAL HIGH (ref 70–99)
Glucose-Capillary: 79 mg/dL (ref 70–99)

## 2013-11-17 LAB — MAGNESIUM: Magnesium: 2.2 mg/dL (ref 1.5–2.5)

## 2013-11-17 LAB — TROPONIN I: Troponin I: <0.3 ng/mL (ref <0.30)

## 2013-11-17 LAB — PROCALCITONIN

## 2013-11-17 LAB — PHOSPHORUS: PHOSPHORUS: 4.2 mg/dL (ref 2.3–4.6)

## 2013-11-17 SURGERY — ARTHROPLASTY, SHOULDER, TOTAL
Anesthesia: General | Site: Shoulder | Laterality: Right

## 2013-11-17 MED ORDER — ASPIRIN 81 MG PO CHEW
81.0000 mg | CHEWABLE_TABLET | Freq: Every day | ORAL | Status: DC
  Administered 2013-11-17 – 2013-11-18 (×2): 81 mg via ORAL
  Filled 2013-11-17 (×2): qty 1

## 2013-11-17 MED ORDER — OXYBUTYNIN CHLORIDE 5 MG PO TABS
2.5000 mg | ORAL_TABLET | Freq: Every day | ORAL | Status: DC
Start: 2013-11-17 — End: 2013-11-18
  Administered 2013-11-17: 2.5 mg via ORAL
  Filled 2013-11-17 (×2): qty 0.5

## 2013-11-17 MED ORDER — POTASSIUM CHLORIDE CRYS ER 20 MEQ PO TBCR
20.0000 meq | EXTENDED_RELEASE_TABLET | Freq: Once | ORAL | Status: AC
  Administered 2013-11-17: 20 meq via ORAL
  Filled 2013-11-17: qty 1

## 2013-11-17 MED ORDER — PREDNISONE 20 MG PO TABS
30.0000 mg | ORAL_TABLET | Freq: Every day | ORAL | Status: DC
  Administered 2013-11-18: 30 mg via ORAL
  Filled 2013-11-17 (×2): qty 1

## 2013-11-17 MED ORDER — FUROSEMIDE 20 MG PO TABS
20.0000 mg | ORAL_TABLET | Freq: Once | ORAL | Status: AC
  Administered 2013-11-17: 20 mg via ORAL
  Filled 2013-11-17: qty 1

## 2013-11-17 MED ORDER — CLONAZEPAM 0.5 MG PO TABS
0.5000 mg | ORAL_TABLET | Freq: Every day | ORAL | Status: DC
  Administered 2013-11-17: 0.5 mg via ORAL
  Filled 2013-11-17: qty 1

## 2013-11-17 NOTE — Progress Notes (Signed)
[id number]/1500/spoke with patient's daughters about ltac and moving [facility] to [facility].  Explained what an LTAC is and how it can used to allow the patient to keeping progressing in an closely watched environment.  Both daughters verbalized their understands and left to [facility].  Will await their response and decision.  Suanne Marker Davis,RB,BSN,CCM  [phone].

## 2013-11-17 NOTE — Progress Notes (Signed)
CARE MANAGEMENT NOTE 11/17/2013  Patient:  [name], [name]   Account Number:  [id number]  Date Initiated:  11/09/2013  Documentation initiated by:  Orva Riles  Subjective/Objective Assessment:   hx of copd excerbation at the present due to influzena A positive.     Action/Plan:   home when stable/lives alone will follow for hhc versus st snf placement or ltac   Anticipated DC Date:  11/20/2013   Anticipated DC Plan:  LONG TERM ACUTE CARE (LTAC)  In-house referral  NA      DC Planning Services  NA  Other      PAC Choice  NA   Choice offered to / List presented to:  NA   DME arranged  NA      DME agency  NA     [place] arranged  NA      [place] agency  NA   Status of service:  In process, will continue to follow Medicare Important Message given?  NA - LOS <3 / Initial given by admissions (If response is "NO", the following Medicare IM given date fields will be blank) Date Medicare IM given:   Date Additional Medicare IM given:    Discharge Disposition:    Per UR Regulation:  Reviewed for med. necessity/level of care/duration of stay  If discussed at [place] of Stay Meetings, dates discussed:   11/15/2013  11/17/2013    Comments:  [id number] Rosana Hoes RN, BSN, CCM [phone] Chart Reviewed for discharge and hospital needs.  pt extubated on [id number]/bipap unitl [id number] transitioned to [place].  transferred to acute tele bed on [id number].  [facility] RN following case.  Patient is eligible for both LTAC's md to speak to daughter about transitioning. Discharge needs at time of review:  None present will follow for needs. Review of patient progress due on [id number].   [id number]/WNIOEV Rosana Hoes, RN, BSN, CCM, [phone] Chart reviewed for update of needs and condition. patient worsened and required intubation on [id number]/01262015 vent 3/wbc-17.4, did test positive for influenza A/ 11/14/13: NO Extubatin as RASS -2  [id number]/Sher Hellinger Rosana Hoes, RN, BSN, [place] [phone] Chart  Reviewed for discharge and hospital needs. Discharge needs at time of review:  None present will follow for needs. Review of patient progress due on [id number].

## 2013-11-17 NOTE — Progress Notes (Signed)
Patient evaluated for [facility] services. Made aware from inpatient RNCM that patient may go to LTAC at discharge. If disposition will be home, will attempt to engage patient and/or family for possible [facility] Care Management services. Will continue to follow.  Marthenia Rolling, MSN, RN,BSN- [facility] [phone]

## 2013-11-17 NOTE — Progress Notes (Addendum)
[facility] PCCM   Name: [name] MRN: [id number] DOB: [birth year]   LOS 9  Days PCP Unice Cobble, MD Pulmonary: Dr [name]  ADMISSION DATE:  11/08/2013 CONSULTATION DATE:  1/21   REFERRING MD :  Doyle Askew PRIMARY SERVICE: [facility]  CHIEF COMPLAINT:  AE COPD  BRIEF PATIENT DESCRIPTION: 73 y/o female with COPD FEV 1L/48% admitted from pulmonary clinic ([facility] patient) with AE COPD and Influenza A (echo 08/21/12 at [facility]  -e f 45%). Baseline described as some anxious at baseline but described as Type A by daughter. Daughter is ER nurse at [employer]. On gabapentin qhs for :night terrors" . She is awaiting shoulder surgery   has a past medical history of Other emphysema; Unspecified arthropathy, shoulder region; Gastric ulcer, unspecified as acute or chronic, without mention of hemorrhage, perforation, or obstruction; Benign paroxysmal positional vertigo; COPD (chronic obstructive pulmonary disease); Diverticula of colon; Osteopenia; Personal history of colonic polyps (2007); Diverticulitis; Hyperlipemia; Fracture of ankle, bimalleolar, right, closed; Hypoxemia; C. 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   has past surgical history that includes Total shoulder arthroplasty (2012); ORIF ankle fracture (2007); martial megeti (0347'Q); Varicose vein surgery (2595; 1985); Total shoulder replacement (12/05/2010); Abdominal hysterectomy; Augmentation mammaplasty (1975); Coronary angioplasty with stent (2010); Coronary angioplasty (1990); Colon surgery (07/07/2011); Appendectomy (1960); Nose surgery; BREAST  AUGMENTTATION; CATARCT  RIGHT; Breast lumpectomy with needle localization and axillary sentinel lymph node bx (Left, 06/02/2013); and Breast implant removal (Left, 06/02/2013).    LINES / TUBES: 1/22 ETT>>1/27 1/22 L IJ CVL >>  CULTURES: 11/08/13 - MRSA PCR - neg 1/20 Influenza A positive 1/22 blood >>neg 1/22 resp culture >> MSSA  ANTIBIOTICS:  1/20 clinda >>1/21,  1/21 tamiflu >>1/28 1/22 vanc >>1/25 1/22 zosyn >>1/15 1/25 Doxy  iv (Mssa) >>dc on 1/29 (PCT normal )    SIGNIFICANT EVENTS / STUDIES:  1/20 CT neck> salivary gland swelling due to sialadenitis 1/21 increased WOB, needing BIPAP 11/11/13: Intubated on 1/22  some agitation/anxiety overnight and this morning 11/12/13: Bradycardia with precedex overnight and then resolved. Currently meets SBT criteria but RN feels patient will benefit from more fentanyl prn 11/12/13: ECHO: Extremely poor acoustic windows limit study LV is difficult to see. Overall LVEF appears to be mild to moderately depressed. There is akinesis of the apex. Other walls are difficult to see well. The cavity size was normal 1/27 more awake. Negative 2 litre x 24 hours. Extubated    SUBJECTIVE/OVERNIGHT/INTERVAL HX Awake and cooperative  11/16/13 note: at 11am 12 beat VTach and then resolved  VITAL SIGNS: Temp:  [98.6 F (37 C)-99.5 F (37.5 C)] 98.6 F (37 C) (01/29 0600) Pulse Rate:  [65-76] 65 (01/29 0600) Resp:  [12-21] 14 (01/29 0600) BP: (102-168)/(37-98) 126/40 mmHg (01/29 0600) SpO2:  [89 %-99 %] 98 % (01/29 0600) HEMODYNAMICS:   VENTILATOR SETTINGS:   INTAKE / OUTPUT: Intake/Output     01/28 0701 - 01/29 0700 01/29 0701 - 01/30 0700   I.V. (mL/kg) 920 (12.8)    IV Piggyback 550    Total Intake(mL/kg) 1470 (20.5)    Urine (mL/kg/hr) 2965 (1.7)    Total Output 2965     Net -1495  PHYSICAL EXAMINATION: General:awake and cooperative , weak HEENT: no JVD PULM: mild wheeze, decreased air movment   CV: RRR, no mgr.   AB: BS+, soft, non tender  Ext: warm, no edema Neuro: follows commands, mae x 4, more awake  LABS: PULMONARY  Recent Labs Lab 11/10/13 1245 11/11/13 1050 11/12/13 0513  PHART 7.304* 7.365 7.403  PCO2ART 56.8* 47.0* 42.7  PO2ART 64.1* 114.0* 92.6  HCO3 27.4* 26.2* 26.1*  TCO2 24.8 23.5 23.2  O2SAT 89.1 97.7 96.6    CBC  Recent Labs Lab 11/15/13 0545 11/16/13 0445 11/17/13 0515  HGB 12.3 11.4* 11.3*  HCT 38.5 36.1 35.7*  WBC 16.5* 11.5* 9.5  PLT 177 180 172    COAGULATION No results found for this basename: INR,  in the last 168 hours  CARDIAC    Recent Labs Lab 11/13/13 0340 11/16/13 1250 11/16/13 1746 11/16/13 2220 11/17/13 0515  TROPONINI <0.30 <0.30 <0.30 <0.30 <0.30    Recent Labs Lab 11/15/13 0545  PROBNP 969.8*     CHEMISTRY  Recent Labs Lab 11/13/13 0340 11/14/13 0640 11/15/13 0545 11/16/13 0445 11/16/13 1250 11/16/13 1746 11/17/13 0515  NA 137 142 144 141 142 142 144  K 4.3 4.2 3.2* 3.6* 3.6* 4.4 4.1  CL 101 100 99 97 97 101 101  CO2 26 33* 36* 37* 36* 35* 34*  GLUCOSE 190* 141* 133* 113* 127* 113* 92  BUN 28* 26* 30* 32* 33* 31* 31*  CREATININE 0.59 0.57 0.60 0.65 0.68 0.66 0.82  CALCIUM 8.2* 8.5 8.4 8.5 9.0 8.9 8.6  MG 2.2 2.1 2.1 2.2 2.2  --  2.2  PHOS 2.3 3.2 3.0 4.0  --   --  4.2   Estimated Creatinine Clearance: 60.2 ml/min (by C-G formula based on Cr of 0.82).   LIVER No results found for this basename: AST, ALT, ALKPHOS, BILITOT, PROT, ALBUMIN, INR,  in the last 168 hours   INFECTIOUS  Recent Labs Lab 11/15/13 0545 11/16/13 1250 11/17/13 0515  LATICACIDVEN 0.2*  --   --   PROCALCITON  --  <0.10 <0.10     ENDOCRINE CBG (last 3)   Recent Labs  11/16/13 1954 11/16/13 2308 11/17/13 0346  GLUCAP 104* 120* 79         IMAGING x48h  Dg Chest Port 1 View  11/17/2013   CLINICAL DATA:  COPD.  EXAM: PORTABLE CHEST - 1 VIEW  COMPARISON:  11/16/2013.  11/08/2013.  FINDINGS: Left IJ catheter in  stable position. Mediastinum and hilar structures are normal. Persistent diffuse interstitial prominence noted. Heart size and pulmonary vascularity normal. No pleural effusion or pneumothorax.  IMPRESSION: 1. 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 This is most likely superimposed on chronic interstitial lung disease. 2. Left IJ catheters in stable position.   Electronically Signed   By: Marcello Moores  Register   On: 11/17/2013 07:48   Dg Chest Port 1 View  11/16/2013   CLINICAL DATA:  Edema.  EXAM: PORTABLE CHEST - 1 VIEW  COMPARISON:  11/15/2013  FINDINGS: Endotracheal tube and nasogastric to have been removed. Left internal jugular central venous line is stable.  There is bilateral irregular interstitial thickening that is unchanged from the previous day's study. There is relative sparing of the right upper lobe likely from emphysema. No focal consolidation. No pleural effusion or pneumothorax.  Cardiac silhouette is normal in size. Mediastinum is normal in caliber.  IMPRESSION: Findings support mild interstitial edema superimposed on chronic interstitial thickening and emphysema. Lung appearance is stable from  the previous day's study. Status post extubation.   Electronically Signed   By: Lajean Manes M.D.   On: 11/16/2013 08:10     Intake/Output Summary (Last 24 hours) at 11/17/13 0825 Last data filed at 11/17/13 1552  Gross per 24 hour  Intake   1380 ml  Output   2825 ml  Net  -1445 ml      ASSESSMENT / PLAN:  PULMONARY A:  Severe Acute Exacerbation of COPD due to influenza A/ s/p exttubation 11/15/13 HCAP with worsening CXR findings Acute hypoxemic respiratory failure due to COPD exacerbation Hemoptysis> likely due to influenza, mild  11/16/13: stable and improving.    P:   -duoneb scheduled q4h;  -change solumedrol to prednisone 32m daily and slow taper over next 7 days from 11/17/13 -may need to hold plavix if hemoptysis reoccurs -OOB and PT to  mobilize   Cardiovascular: A:  [facility] [place] systolic CHF  12/33/61 12 beat V tach at 11am  In settnig of low K. MI ruled out  11/17/13 No more NS Vtach after K corrected. Severe hypertension of several days ago while on vent has resolved  P: -cards consult if Ns vtach recures  - keep K goal > 4 and  Mag Goal > 2 - hydralaizine prn + scheduled bisoprolol (instead of lopressor home med in view of cop) --continue home  plavix for now (stopp if hemoptysis recurs) + restart home ASA 8107mdaily - lasix tab on 11/17/13 and recheck bnp; need to decide on chronic lasix at dc due to chronic systolic CHF   GASTROINTESTINAL A:  Constipation>resolved Sialadenitis > resolved P:   -monitor neck exam  RENAL AL: Normal P: Dc foley resart home ditropan  INFECTIOUS A:  Influenza A, s/p tamiflu Rx HCAP > MSSA  11/17/13 PCT < 0.1 x 2 and has finished 9d adwquate RX P:   DC all abx Monitor off abx  Neuro: A: Anxiety/agitation/Night terrros - Home meds are klonopin QHs, T#3 and Roxicent (2 meds with tylenol) and neurontin  11/13/13: Hx of Type A personality and mild anxity. On nocturanl klonopin Hx of night terorros on neurotnin restarted  1/29 cooperative and calm  P: - gabapentin qhs to continue -T#3 restarreed 11/16/13 (need to sort out why dual opioid agents with tyleno) - resart nocturnal klonopin 11/17/13 QHS  ONCOLOGY A Breast Cancer on Armidex 55m62maily P  currently on hold, need to talk to daughter about restart  GLOBAL 09/23/13: Daughter AshJanene [phone] over phone from bedsie 1/28 mobilize. Duaghter updated at bedside by Staff MD.  1/29 transfer to tele floor. Called dauighter; missed call. She should call back (staff MD comment).  She is LTAC [facility] eligible but need to get her more stable and need to check with daughter if this is their goal. She is still very deconditioned and will benefit from  stay there    [place] [phone] 3 pm If no answer page [phone]/2015, 8:25 AM  STAFF MD See my commnents above. Agreee with NP otherwise  Dr. MurBrand Males.D., F.COklahoma City Va Medical CenterP Pulmonary and Critical Care Medicine Staff Physician [facility] Pager: [phone] no answer or between  15:00h - 7:00h: call [phone]  11/17/2013 10:36 AM

## 2013-11-17 NOTE — Progress Notes (Signed)
Physical Therapy Treatment Patient Details Name: [name] MRN: [id number] DOB: August 07, 1941 Today's Date: 11/17/2013 Time: 2542-7062 PT Time Calculation (min): 26 min  PT Assessment / Plan / Recommendation  History of Present Illness [name] is a 73 y.o. female with Past medical history of COPD, GERD, CVA, CAD status post PCI, hypertension, breast cancer, pt admitted with exacerbation, VDRF 1/22-1/27.   PT Comments   Pt in bed with 2 daughters at bed side.  On 6 lts O2 Assisted pt OOB + 2 assist and amb limited distance using EVA walker for increased support.  Left in recliner positioned with pillows. Very limited activity tolerance and general weakness.   Follow Up Recommendations  SNF     Does the patient have the potential to tolerate intense rehabilitation     Barriers to Discharge        Equipment Recommendations  None recommended by PT    Recommendations for Other Services    Frequency Min 3X/week   Progress towards PT Goals Progress towards PT goals: Progressing toward goals (slowly)  Plan Current plan remains appropriate    Precautions / Restrictions Precautions Precautions: Fall Precaution Comments: monitor sats Restrictions Weight Bearing Restrictions: No    Pertinent Vitals/Pain Supine: 5 lts 94%, HR 72, RR 17, BP 111/46(68) EOB:    5 lts 98%, HR 72, RR 19, BP 126/53(80) After amb: 6 lts O2 98%, HR 73, RR 22, BP 109/46(70)    Mobility  Bed Mobility Overal bed mobility: +2 for physical assistance;+ 2 for safety/equipment;Needs Assistance Bed Mobility: Supine to Sit Supine to sit: Total assist;+2 for physical assistance;+2 for safety/equipment General bed mobility comments: HOB raised and pad used to get pt to edge of bed and lifting assist to right trunk.Pt required  total assist to get back to supine and legs onto bed. Transfers Overall transfer level: Needs assistance Equipment used:  (EVA walker) Transfers: Sit to/from Stand Sit to Stand:  +2 physical assistance;+2 safety/equipment;Max assist General transfer comment: Used EVA walker for increased support with 50% VC's on proper tech Ambulation/Gait Ambulation/Gait assistance: +2 physical assistance;+2 safety/equipment Ambulation Distance (Feet): 22 Feet Assistive device:  (EVA walker) Gait Pattern/deviations: Step-to pattern;Step-through pattern;Decreased step length - right;Decreased step length - left;Narrow base of support;Staggering left;Staggering right;Shuffle Gait velocity: decreased General Gait Details: used EVA walker for increased support which pt liked.  + 4 assist (2 on each side, one for IV and one for recliner to follow closely behind).     PT Goals (current goals can now be found in the care plan section)    Visit Information  Last PT Received On: 11/17/13 Assistance Needed: +2 History of Present Illness: [name] is a 73 y.o. female with Past medical history of COPD, GERD, CVA, CAD status post PCI, hypertension, breast cancer, pt admitted with exacerbation, VDRF 1/22-1/27.    Subjective Data      Cognition       Balance     End of Session PT - End of Session Equipment Utilized During Treatment: Gait belt;Oxygen (6 lts) Activity Tolerance: Patient limited by fatigue;Treatment limited secondary to medical complications (Comment) Patient left: in chair;with call bell/phone within reach;with family/visitor present Nurse Communication: Mobility status   Rica Koyanagi  PTA [facility]  Acute  Rehab Pager      [phone]

## 2013-11-18 ENCOUNTER — Inpatient Hospital Stay
Admission: AD | Admit: 2013-11-18 | Discharge: 2013-11-25 | Disposition: A | Discharge status: Other Acute Inpatient Hospital | Payer: Self-pay | Source: Ambulatory Visit | Attending: Internal Medicine | Admitting: Internal Medicine

## 2013-11-18 LAB — PHOSPHORUS: Phosphorus: 5 mg/dL — ABNORMAL HIGH (ref 2.3–4.6)

## 2013-11-18 LAB — CBC WITH DIFFERENTIAL/PLATELET
BASOS PCT: 0 % (ref 0–1)
Basophils Absolute: 0 10*3/uL (ref 0.0–0.1)
Eosinophils Absolute: 0.2 10*3/uL (ref 0.0–0.7)
Eosinophils Relative: 2 % (ref 0–5)
HCT: 35.3 % — ABNORMAL LOW (ref 36.0–46.0)
Hemoglobin: 11.5 g/dL — ABNORMAL LOW (ref 12.0–15.0)
Lymphocytes Relative: 30 % (ref 12–46)
Lymphs Abs: 3 10*3/uL (ref 0.7–4.0)
MCH: 29 pg (ref 26.0–34.0)
MCHC: 32.6 g/dL (ref 30.0–36.0)
MCV: 88.9 fL (ref 78.0–100.0)
Monocytes Absolute: 0.9 10*3/uL (ref 0.1–1.0)
Monocytes Relative: 9 % (ref 3–12)
NEUTROS ABS: 5.9 10*3/uL (ref 1.7–7.7)
NEUTROS PCT: 59 % (ref 43–77)
PLATELETS: 198 10*3/uL (ref 150–400)
RBC: 3.97 MIL/uL (ref 3.87–5.11)
RDW: 14.2 % (ref 11.5–15.5)
WBC: 10 10*3/uL (ref 4.0–10.5)

## 2013-11-18 LAB — BASIC METABOLIC PANEL
BUN: 26 mg/dL — AB (ref 6–23)
CALCIUM: 9 mg/dL (ref 8.4–10.5)
CO2: 33 mEq/L — ABNORMAL HIGH (ref 19–32)
Chloride: 99 mEq/L (ref 96–112)
Creatinine, Ser: 0.78 mg/dL (ref 0.50–1.10)
GFR, EST NON AFRICAN AMERICAN: 82 mL/min — AB (ref >90)
GLUCOSE: 106 mg/dL — AB (ref 70–99)
POTASSIUM: 4 meq/L (ref 3.7–5.3)
SODIUM: 142 meq/L (ref 137–147)

## 2013-11-18 LAB — MAGNESIUM: MAGNESIUM: 2.2 mg/dL (ref 1.5–2.5)

## 2013-11-18 LAB — PRO B NATRIURETIC PEPTIDE: Pro B Natriuretic peptide (BNP): 241.2 pg/mL — ABNORMAL HIGH (ref 0–125)

## 2013-11-18 MED ORDER — CLONAZEPAM 0.5 MG PO TABS
0.5000 mg | ORAL_TABLET | Freq: Once | ORAL | Status: DC
Start: 2013-11-18 — End: 2013-11-18

## 2013-11-18 MED ORDER — IPRATROPIUM-ALBUTEROL 0.5-2.5 (3) MG/3ML IN SOLN: 3.0000 mL | RESPIRATORY_TRACT | Status: DC

## 2013-11-18 MED ORDER — BISOPROLOL FUMARATE 10 MG PO TABS
20.0000 mg | ORAL_TABLET | Freq: Every day | ORAL | Status: DC
  Filled 2013-11-18: qty 2

## 2013-11-18 MED ORDER — ALBUTEROL SULFATE (2.5 MG/3ML) 0.083% IN NEBU: 2.5000 mg | INHALATION_SOLUTION | RESPIRATORY_TRACT | Status: DC | PRN

## 2013-11-18 MED ORDER — ANASTROZOLE 1 MG PO TABS: 1.0000 mg | ORAL_TABLET | Freq: Every day | ORAL | Status: DC

## 2013-11-18 MED ORDER — SODIUM CHLORIDE 0.9 % IV BOLUS (SEPSIS)
500.0000 mL | Freq: Once | INTRAVENOUS | Status: AC
  Administered 2013-11-18: 500 mL via INTRAVENOUS

## 2013-11-18 MED ORDER — BISOPROLOL FUMARATE 10 MG PO TABS: 20.0000 mg | ORAL_TABLET | Freq: Every day | ORAL | Status: DC

## 2013-11-18 MED ORDER — PREDNISONE 10 MG PO TABS: 30.0000 mg | ORAL_TABLET | Freq: Every day | ORAL | Status: DC

## 2013-11-18 NOTE — Progress Notes (Signed)
Physical Therapy Treatment Patient Details Name: [name] MRN: [id number] DOB: 22-May-1941 Today's Date: 11/18/2013 Time: 6283-1517 PT Time Calculation (min): 28 min  PT Assessment / Plan / Recommendation  History of Present Illness [name] is a 73 y.o. female with Past medical history of COPD, GERD, CVA, CAD status post PCI, hypertension, breast cancer, pt admitted with exacerbation, VDRF 1/22-1/27.   PT Comments   Pt already OOB in recliner.  Assisted with amb twice with one sitting rest break on 5 lts O2.  Follow Up Recommendations  SNF     Does the patient have the potential to tolerate intense rehabilitation     Barriers to Discharge        Equipment Recommendations  None recommended by PT    Recommendations for Other Services    Frequency Min 3X/week   Progress towards PT Goals Progress towards PT goals: Progressing toward goals  Plan      Precautions / Restrictions Precautions Precautions: Fall Restrictions Weight Bearing Restrictions: No    Pertinent Vitals/Pain No c/o pain    Mobility  Bed Mobility Overal bed mobility: +2 for physical assistance;+ 2 for safety/equipment;Needs Assistance Bed Mobility: Sit to Supine Sit to supine: +2 for safety/equipment;+2 for physical assistance;Total assist (pt 50%) Transfers Overall transfer level: Needs assistance Equipment used:  (EVA walker) Transfers: Sit to/from Stand Sit to Stand: +2 physical assistance;+2 safety/equipment;Max assist (Pt 50%) General transfer comment: Used EVA walker for increased support with 50% VC's on proper tech Ambulation/Gait Ambulation/Gait assistance: +2 physical assistance;+2 safety/equipment (Pt 50%) Ambulation Distance (Feet): 46 Feet (23 feet x 2) Assistive device:  (EVA walker) Gait Pattern/deviations: Step-through pattern;Decreased step length - right;Decreased step length - left;Decreased stance time - right;Shuffle;Trunk flexed;Narrow base of support Gait velocity:  decreased General Gait Details: used EVA walker for increased support which pt liked.  Pt amb twive with one sitting rest break. Chair following behind.       PT Goals (current goals can now be found in the care plan section)    Visit Information  Last PT Received On: 11/18/13 Assistance Needed: +2 History of Present Illness: [name] is a 73 y.o. female with Past medical history of COPD, GERD, CVA, CAD status post PCI, hypertension, breast cancer, pt admitted with exacerbation, VDRF 1/22-1/27.    Subjective Data      Cognition       Balance     End of Session PT - End of Session Equipment Utilized During Treatment: Gait belt;Oxygen Activity Tolerance: Patient limited by fatigue;Treatment limited secondary to medical complications (Comment) Patient left: with call bell/phone within reach;with family/visitor present;in bed Nurse Communication: Mobility status   Rica Koyanagi  PTA [facility]  Acute  Rehab Pager      [phone]

## 2013-11-18 NOTE — Progress Notes (Signed)
Staff MD Dc to [facility] today for LTAC needs. Daughter agreeable. SHe has not been updated by me this day 11/18/2013   > 35 min dc planning Dr. Brand Males, M.D., [facility].C.P Pulmonary and Critical Care Medicine Staff Physician [place] [facility] Pager: [phone], If no answer or between  15:00h - 7:00h: call [phone]  11/18/2013 12:03 PM

## 2013-11-18 NOTE — Progress Notes (Signed)
[facility] PCCM   Name: [name] MRN: [id number] DOB: 1941-06-16   LOS 10  Days PCP Unice Cobble, MD Pulmonary: Dr [name]  ADMISSION DATE:  11/08/2013 CONSULTATION DATE:  1/21   REFERRING MD :  Doyle Askew PRIMARY SERVICE: TRH  CHIEF COMPLAINT:  AE COPD  BRIEF PATIENT DESCRIPTION: 73 y/o female with COPD FEV 1L/48% admitted from pulmonary clinic ([facility] patient) with AE COPD and Influenza A (echo 08/21/12 at [facility]  -e f 45%). Baseline described as some anxious at baseline but described as Type A by daughter. Daughter is ER nurse at [employer] On gabapentin qhs for :night terrors" . She is awaiting shoulder surgery   has a past medical history of Other emphysema; Unspecified arthropathy, shoulder region; Gastric ulcer, unspecified as acute or chronic, without mention of hemorrhage, perforation, or obstruction; Benign paroxysmal positional vertigo; COPD (chronic obstructive pulmonary disease); Diverticula of colon; Osteopenia; Personal history of colonic polyps (2007); Diverticulitis; Hyperlipemia; Fracture of ankle, bimalleolar, right, closed; Hypoxemia; C. 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   has past surgical history that includes Total shoulder arthroplasty (2012); ORIF ankle fracture (2007); martial megeti (7517'G); Varicose vein surgery (0174; 1985); Total shoulder replacement (12/05/2010); Abdominal hysterectomy; Augmentation mammaplasty (1975); Coronary angioplasty with stent (2010); Coronary angioplasty (1990); Colon surgery (07/07/2011); Appendectomy (1960); Nose surgery; BREAST  AUGMENTTATION; CATARCT  RIGHT; Breast lumpectomy with needle localization and axillary sentinel lymph node bx (Left, 06/02/2013); and Breast implant removal (Left, 06/02/2013).    LINES / TUBES: 1/22 ETT>>1/27 1/22 L IJ CVL >>1/30  CULTURES: 11/08/13 - MRSA PCR - neg 1/20 Influenza A positive 1/22 blood >>neg 1/22 resp culture >> MSSA  ANTIBIOTICS:  1/20 clinda >>1/21,  1/21 tamiflu >>1/28 1/22 vanc >>1/25 1/22 zosyn >>1/15 1/25 Doxy  iv (Mssa) >>dc on 1/29 (PCT normal )    SIGNIFICANT EVENTS / STUDIES:  1/20 CT neck> salivary gland swelling due to sialadenitis 1/21 increased WOB, needing BIPAP 11/11/13: Intubated on 1/22  some agitation/anxiety overnight and this morning 11/12/13: Bradycardia with precedex overnight and then resolved. Currently meets SBT criteria but RN feels patient will benefit from more fentanyl prn 11/12/13: ECHO: Extremely poor acoustic windows limit study LV is difficult to see. Overall LVEF appears to be mild to moderately depressed. There is akinesis of the apex. Other walls are difficult to see well. The cavity size was normal 1/27 more awake. Negative 2 litre x 24 hours. Extubated 1/30 hypotensive but 5 litres negative  SUBJECTIVE/OVERNIGHT/INTERVAL HX Awake and cooperative. Noted to hypotensive but awake and alert   VITAL SIGNS: Temp:  [98.6 F (37 C)-99.9 F (37.7 C)] 98.6 F (37 C) (01/30 0600) Pulse Rate:  [67-78] 70 (01/30 0800) Resp:  [12-21] 15 (01/30 0800) BP: (87-137)/(30-103) 95/42 mmHg (01/30 0800) SpO2:  [91 %-99 %] 96 % (01/30 0813) HEMODYNAMICS:   VENTILATOR SETTINGS:   INTAKE / OUTPUT: Intake/Output     01/29 0701 - 01/30 0700 01/30 0701 - 01/31 0700   P.O. 240    I.V. (mL/kg) 920 (12.8)    IV Piggyback     Total Intake(mL/kg) 1160 (16.2)    Urine (mL/kg/hr) 3650 (2.1)    Total Output 3650     Net -2490  PHYSICAL EXAMINATION: General:awake and cooperative , weak HEENT: no JVD PULM: mild wheeze, decreased  air movment   CV: RRR, no mgr.  AB: BS+, soft, non tender  Ext: warm, no edema Neuro: follows commands, mae x 4, more awake  LABS: PULMONARY  Recent Labs Lab 11/11/13 1050 11/12/13 0513  PHART 7.365 7.403  PCO2ART 47.0* 42.7  PO2ART 114.0* 92.6  HCO3 26.2* 26.1*  TCO2 23.5 23.2  O2SAT 97.7 96.6    CBC  Recent Labs Lab 11/16/13 0445 11/17/13 0515 11/18/13 0400  HGB 11.4* 11.3* 11.5*  HCT 36.1 35.7* 35.3*  WBC 11.5* 9.5 10.0  PLT 180 172 198    COAGULATION No results found for this basename: INR,  in the last 168 hours  CARDIAC    Recent Labs Lab 11/13/13 0340 11/16/13 1250 11/16/13 1746 11/16/13 2220 11/17/13 0515  TROPONINI <0.30 <0.30 <0.30 <0.30 <0.30    Recent Labs Lab 11/15/13 0545 11/18/13 0400  PROBNP 969.8* 241.2*     CHEMISTRY  Recent Labs Lab 11/14/13 0640 11/15/13 0545 11/16/13 0445 11/16/13 1250 11/16/13 1746 11/17/13 0515 11/18/13 0400  NA 142 144 141 142 142 144 142  K 4.2 3.2* 3.6* 3.6* 4.4 4.1 4.0  CL 100 99 97 97 101 101 99  CO2 33* 36* 37* 36* 35* 34* 33*  GLUCOSE 141* 133* 113* 127* 113* 92 106*  BUN 26* 30* 32* 33* 31* 31* 26*  CREATININE 0.57 0.60 0.65 0.68 0.66 0.82 0.78  CALCIUM 8.5 8.4 8.5 9.0 8.9 8.6 9.0  MG 2.1 2.1 2.2 2.2  --  2.2 2.2  PHOS 3.2 3.0 4.0  --   --  4.2 5.0*   Estimated Creatinine Clearance: 61.7 ml/min (by C-G formula based on Cr of 0.78).   LIVER No results found for this basename: AST, ALT, ALKPHOS, BILITOT, PROT, ALBUMIN, INR,  in the last 168 hours   INFECTIOUS  Recent Labs Lab 11/15/13 0545 11/16/13 1250 11/17/13 0515  LATICACIDVEN 0.2*  --   --   PROCALCITON  --  <0.10 <0.10     ENDOCRINE CBG (last 3)   Recent Labs  11/16/13 1954 11/16/13 2308 11/17/13 0346  GLUCAP 104* 120* 79         IMAGING x48h  Dg Chest Port 1 View  11/17/2013   CLINICAL DATA:  COPD.  EXAM: PORTABLE CHEST - 1 VIEW  COMPARISON:  11/16/2013.  11/08/2013.  FINDINGS: Left IJ catheter in  stable position. Mediastinum and hilar structures are normal. Persistent diffuse interstitial prominence noted. Heart size and pulmonary vascularity normal. No pleural effusion or pneumothorax.  IMPRESSION: 1. 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 This is most likely superimposed on chronic interstitial lung disease. 2. Left IJ catheters in stable position.   Electronically Signed   By: Marcello Moores  Register   On: 11/17/2013 07:48     Intake/Output Summary (Last 24 hours) at 11/18/13 0928 Last data filed at 11/18/13 0600  Gross per 24 hour  Intake   1080 ml  Output   3650 ml  Net  -2570 ml      ASSESSMENT / PLAN:  PULMONARY A:  Severe Acute Exacerbation of COPD due to influenza A/ s/p exttubation 11/15/13 HCAP with worsening CXR findings Acute hypoxemic respiratory failure due to COPD exacerbation Hemoptysis> likely due to influenza, mild  11/16/13: stable and improving.  1/30 pulmonary status stable. Wean O2 as tolerated.  P:   -duoneb scheduled q4h;  -change solumedrol to prednisone 79m daily and slow taper over  next 7 days from 11/17/13 -may need to hold plavix if hemoptysis reoccurs -OOB and PT to mobilize   Cardiovascular: A:  Hypertension CAD, DES 2013 EF 45% at [facility] in 8416 - Chrinci systolic CHF  03/25/29: 12 beat V tach at 11am  In settnig of low K. MI ruled out  11/17/13 No more NS Vtach after K corrected. Severe hypertension of several days ago while on vent has resolved 1/30 mild hypotesion most likely from aggressive diuresis and beta blockers. P: -cards consult if Ns vtach recures  - keep K goal > 4 and  Mag Goal > 2 - hydralaizine prn + scheduled bisoprolol (instead of lopressor home med in view of cop) --continue home  plavix for now (stopp if hemoptysis recurs) + restart home ASA 50m daily - lasix tab on 11/17/13 and recheck bnp; need to decide on chronic lasix at dc due to chronic systolic CHF -11/60hold lasix and  bystolic, 5109cc ns x 1.  [place] [facility]  Constipation>resolved Sialadenitis > resolved P:   -monitor neck exam  RENAL  Recent Labs Lab 11/16/13 1746 11/17/13 0515 11/18/13 0400  K 4.4 4.1 4.0    Lab Results  Component Value Date   CREATININE 0.78 11/18/2013   CREATININE 0.82 11/17/2013   CREATININE 0.66 11/16/2013   CREATININE 0.9 04/20/2013   CREATININE 0.85 07/23/2011    AL: Normal P: Dc foley resart home ditropan  INFECTIOUS A:  Influenza A, s/p tamiflu Rx HCAP > MSSA  11/17/13 PCT < 0.1 x 2 and has finished 9d adwquate RX P:   DC all abx Monitor off abx  Neuro: A: Anxiety/agitation/Night terrros - Home meds are klonopin QHs, T#3 and Roxicent (2 meds with tylenol) and neurontin  11/13/13: Hx of Type A personality and mild anxity. On nocturanl klonopin Hx of night terorros on neurotnin restarted  1/29 cooperative and calm  P: - gabapentin qhs to continue -T#3 restarreed 11/16/13 (need to sort out why dual opioid agents with tyleno) - resart nocturnal klonopin 11/17/13 QHS  ONCOLOGY A Breast Cancer on Armidex 158mdaily P  currently on hold, need to talk to daughter about restart  GLOBAL 09/23/13: Daughter AsJanene [phone] over phone from bedsie 1/28 mobilize. Duaghter updated at bedside by Staff MD.  1/29 transfer to tele floor. Called dauighter; missed call. She should call back (staff MD comment).  She is [facility] eligible but need to get her more stable and need to check with daughter if this is their goal. She is still very deconditioned and will benefit from stay there  [facility] and we will transfer her today 11/18/13.  [facility] and bystolic have been [facility] today due to mild hypotension (87/37) 94/42 at time of impending discharge. 1/30 we will leave her central line in place as she will need a picc line in the near  future.  StRichardson Landryinor ACNP LeMaryanna ShapeCCM Pager [phone] 3 pm If no answer page [phone]/30/2015, 9:28 AM

## 2013-11-18 NOTE — Discharge Summary (Signed)
Physician Discharge Summary  Patient ID: [name] MRN: [id number] DOB/AGE: Oct 07, 1941 73 y.o.  Admit date: 11/08/2013 Discharge date: 11/18/2013  Problem List Principal Problem:   COPD exacerbation Active Problems:   CORONARY ARTERY DISEASE   CVA   Acute bronchitis   CEREBROVASCULAR ACCIDENT, HX OF   Hypertension   Breast cancer, left, LIQ, Stage 1, receptor+, her2 neg   Sialadenitis   Acute respiratory failure   MSSA (methicillin susceptible Staphylococcus aureus) pneumonia   Non-sustained ventricular tachycardia  HPI: [name] is a 73 y.o. female with Past medical history of COPD, GERD, CVA, CAD status post PCI, hypertension, breast cancer.  The patient is coming from home.  The patient presents with complaints of cough and shortness of breath that has been ongoing since Wednesday. She mentions that she has baseline shortness of breath and gets out of breath walking to and fro to the mailbox at her baseline. Since last Wednesday she has been having progressive shortness of breath. She also had a cough with yellow-green expectoration and mentions that because of the cough her chest has been hurting. No sick contacts. No hospitalization no recent travel. No aspiration. She is constipated but last bowel movement 4 days ago. She recently has been started on OxyIR for right shoulder pain for which she scheduled for a total replacement on 29th. Today's she saw her pulmonologist for preop clearance at which time as she was hypoxic and tachypneic was sent to the ED.  she has completed five-day course of azithromycin and of short taper prednisone from 20 mg in the last 5 days.  She denies any orthopnea or PND chest pressure exertional chest pain. She is a denies any recent travel leg swelling or leg tenderness.  She is a former smoker quit smoking 16 years ago.  She uses 2 L of oxygen as needed but since last Friday has been using it continuously. She also has noted swelling at the  base of her right side of mandible since this morning     Hospital Course: Past Medical History   Diagnosis  Date   .  Other emphysema    .  Unspecified arthropathy, shoulder region    .  Gastric ulcer, unspecified as acute or chronic, without mention of hemorrhage, perforation, or obstruction    .  Benign paroxysmal positional vertigo    .  COPD (chronic obstructive pulmonary disease)    .  Diverticula of colon    .  Osteopenia    .  Personal history of colonic polyps  2007     tubular adenoma high grade dysplasia   .  Diverticulitis    .  Hyperlipemia    .  Fracture of ankle, bimalleolar, right, closed    .  Hypoxemia    .  C. difficile diarrhea    .  Varicose vein    .  Asthma    .  GERD (gastroesophageal reflux disease)    .  Hepatitis  1989   .  Arthritis      "both shoulders"   .  TIA (transient ischemic attack)  04/2008; 08/21/2012     "slight droop left lower lip after 08/21/12 TIA"   .  On home oxygen therapy    .  Ischemic cardiomyopathy      a. EF 45-50% 2011. b. EF 35-40% by cath 09/01/12.   .  Night terrors, adult      dr Leonie Man referred for PSG in 2011, and again  in 2013   .  Breast cancer  04/08/13 bx     left   .  Allergy    .  CAD (coronary artery disease)      a. ant MI 64 tx with TPA/PTCA. b. Prior stenting procedures including RCA stent, DES to LCx 04/2011. c. [place] -> DES to prox LAD 08/2012.   Marland Kitchen  Hypertension      TAKES FOR H/O STROKE   .  CVA (cerebral vascular accident)    .  Stroke, acute, embolic      Nov 1761 , [facility] , was treated with TPA.   .  Breast cancer, left, LIQ, Stage 1, receptor+, her2 neg  04/14/2013     June 2014 invasive ductal cancer, rx lumpectomy, removal of implant and capsuel, SLN on 81414. Path G1 IDC, 1.6 cm, neg margin, neg SLN    Past Surgical History   Procedure  Laterality  Date   .  Total shoulder arthroplasty   2012     left   .  Orif ankle fracture   2007     right   .  Martial megeti   6073'X     "bladder  operation; not successful " (08/31/2012)   .  Varicose vein surgery   1983; 1985     "both legs both times" (08/31/2012)   .  Total shoulder replacement   12/05/2010     Dr Onnie Graham   .  Abdominal hysterectomy       TAH w/BSO   .  Augmentation mammaplasty   1975   .  Coronary angioplasty with stent placement   2010   .  Coronary angioplasty   1990     "3 times" (08/31/2012)   .  Colon surgery   07/07/2011     Dr. Hassell Done   .  Appendectomy   1960   .  Nose surgery       1977   .  Breast augmenttation     .  Catarct right       RIGHT EYE   .  Breast lumpectomy with needle localization and axillary sentinel lymph node bx  Left  06/02/2013     Procedure: BREAST LUMPECTOMY WITH NEEDLE LOCALIZATION AND AXILLARY SENTINEL LYMPH NODE BX; Surgeon: Haywood Lasso, MD; Location: [facility]; Service: General; Laterality: Left; NEEDLE LOC BCG 7:30  NUC MED 9:30  RECONSTRUCTION TO FOLLOW 1 HOUR added   .  Breast implant removal  Left  06/02/2013     Procedure: REMOVAL BREAST IMPLANTS; LEFT BREAST CAPSULECTOMY WITH PLACEMENT OF TISSUE EXPANDER AND USE OF FLEX HD; Surgeon: Crissie Reese, MD; Location: [facility]; Service: Plastics; Laterality: Left;    Social History: reports that she quit smoking about 16 years ago. Her smoking use included Cigarettes. She has a 40 pack-year smoking history. She has never used smokeless tobacco. She reports that she drinks about 1.8 ounces of alcohol per week. She reports that she does not use illicit drugs.  Independent for most of her ADL.  Allergies   Allergen  Reactions   .  Escitalopram Oxalate  Shortness Of Breath, Diarrhea and Nausea And Vomiting     Hot flashes, dizziness   .  Indomethacin  Other (See Comments)     "made me very very dizzy and made me have vertigo" (08/31/2012)   .  Nsaids      Jerking, hot flashes, nausea   .  Codeine  Nausea And Vomiting     "  I can take codeine in my cough medicine" (08/31/2012)   .  Sulfonamide Derivatives  Other (See Comments)      Drug induced hepatitis in 1989    Family History   Problem  Relation  Age of Onset   .  Breast cancer  Sister      x 2   .  Colon cancer  Neg Hx    .  Diabetes  Paternal Grandmother    .  Uterine cancer  Paternal Grandmother    .  Lung cancer  Paternal Grandmother    .  Heart disease  Mother    .  Alcohol abuse  Mother    .  Heart disease  Father    .  Pancreatic cancer  Cousin    .  Colon polyps  Sister    .  Diabetes  Maternal Uncle    .  Heart disease  Maternal Grandfather    .  Heart disease  Paternal Grandfather    .  Stroke  Paternal Grandfather      LINES / TUBES:  1/22 ETT>>1/27  1/22 L IJ CVL >>1/30  CULTURES:  11/08/13 - MRSA PCR - neg  1/20 Influenza A positive  1/22 blood >>neg  1/22 resp culture >> MSSA  ANTIBIOTICS:  1/20 clinda >>1/21,  1/21 tamiflu >>1/28  1/22 vanc >>1/25  1/22 zosyn >>1/15  1/25 Doxy iv (Mssa) >>dc on 1/29 (PCT normal )  SIGNIFICANT EVENTS / STUDIES:  1/20 CT neck> salivary gland swelling due to sialadenitis  1/21 increased WOB, needing BIPAP  11/11/13: Intubated on 1/22 some agitation/anxiety overnight and this morning  11/12/13: Bradycardia with precedex overnight and then resolved. Currently meets SBT criteria but RN feels patient will benefit from more fentanyl prn  11/12/13: ECHO: Extremely poor acoustic windows limit study LV is difficult to see. Overall LVEF appears to be mild to moderately depressed. There is akinesis of the apex. Other walls are difficult to see well. The cavity size was normal  1/27 more awake. Negative 2 litre x 24 hours. Extubated  1/30 hypotensive but 5 litres negative   ASSESSMENT / PLAN:  PULMONARY  A:  Severe Acute Exacerbation of COPD due to influenza A/ s/p exttubation 11/15/13  HCAP with worsening CXR findings  Acute hypoxemic respiratory failure due to COPD exacerbation  Hemoptysis> likely due to influenza, mild  11/16/13: stable and improving.  1/30 pulmonary status stable. Wean O2 as  tolerated.  P:  -duoneb scheduled q4h;  -change solumedrol to prednisone 18m daily and slow taper over next 7 days from 11/17/13  -may need to hold plavix if hemoptysis reoccurs  -OOB and PT to mobilize  Cardiovascular:  A:  Hypertension  CAD, DES 2013  [facility] systolic CHF  14/43/15 12 beat V tach at 11am In settnig of low K. MI ruled out  11/17/13 No more NS Vtach after K corrected. Severe hypertension of several days ago while on vent has resolved  1/30 mild hypotesion most likely from aggressive diuresis and beta blockers.  P:  -cards consult if Ns vtach recures - keep K goal > 4 and Mag Goal > 2  - hydralaizine prn + scheduled bisoprolol (instead of lopressor home med in view of cop) --continue home plavix for now (stopp if hemoptysis recurs) + restart home ASA 863mdaily  - lasix tab on 11/17/13 and recheck bnp; need to decide on chronic lasix at dc due to chronic  systolic CHF  -3/24 hold lasix and bystolic, 401 cc ns x 1. Reevaluate 1/31 at [facility]: Constipation>resolved  Sialadenitis > resolved  P:  -monitor neck exam  RENAL   Recent Labs  Lab  11/16/13 1746  11/17/13 0515  11/18/13 0400   K  4.4  4.1  4.0    Lab Results   Component  Value  Date    CREATININE  0.78  11/18/2013    CREATININE  0.82  11/17/2013    CREATININE  0.66  11/16/2013    CREATININE  0.9  04/20/2013    CREATININE  0.85  07/23/2011    AL: Normal  P:  Dc foley  resart home ditropan  INFECTIOUS  A: Influenza A, s/p tamiflu Rx  HCAP > MSSA  11/17/13 PCT < 0.1 x 2 and has finished 9d adwquate RX  P:  DC all abx  Monitor off abx  Neuro:  A: Anxiety/agitation/Night terrros - Home meds are klonopin QHs, T#3 and Roxicent (2 meds with tylenol) and neurontin  11/13/13: Hx of Type A personality and mild anxity. On nocturanl klonopin Hx of night terorros on neurotnin restarted  1/29 cooperative and calm  P:  - gabapentin qhs to  continue  -T#3 restarreed 11/16/13 (need to sort out why dual opioid agents with tyleno)  - resart nocturnal klonopin 11/17/13 QHS  ONCOLOGY  A Breast Cancer on Armidex 35m daily  P  currently on hold, need to talk to daughter about restart  GLOBAL  09/23/13: Daughter AJanene [phone] over phone from bedsie  1/28 mobilize. Duaghter updated at bedside by Staff MD.  1/29 transfer to tele floor. Called dauighter; missed call. She should call back (staff MD comment). She is [facility] eligible but need to get her more stable and need to check with daughter if this is their goal. She is still very deconditioned and will benefit from stay there  [facility] we will transfer her today 11/18/13.   [facility] and by[facility]olic have been [facility] today due(1/30) to mild hypotension (87/37) 94/42 at time of impending discharge.   1/30 we will leave her central line in place as she will need a picc line in the near future.        Labs at discharge Lab Results  Component Value Date   CREATININE 0.78 11/18/2013   BUN 26* 11/18/2013   NA 142 11/18/2013   K 4.0 11/18/2013   CL 99 11/18/2013   CO2 33* 11/18/2013   Lab Results  Component Value Date   WBC 10.0 11/18/2013   HGB 11.5* 11/18/2013   HCT 35.3* 11/18/2013   MCV 88.9 11/18/2013   PLT 198 11/18/2013   Lab Results  Component Value Date   ALT 38* 11/09/2013   AST 34 11/09/2013   ALKPHOS 127* 11/09/2013   BILITOT 0.3 11/09/2013   Lab Results  Component Value Date   INR 1.04 11/09/2013   INR 1.01 05/31/2013   INR 1.00 08/31/2012    Current radiology studies Dg Chest Port 1 View  11/17/2013   CLINICAL DATA:  COPD.  EXAM: PORTABLE CHEST - 1 VIEW  COMPARISON:  11/16/2013.  11/08/2013.  FINDINGS: Left IJ catheter in stable position. Mediastinum and hilar structures are normal. Persistent diffuse interstitial prominence noted. Heart size and pulmonary vascularity normal. No pleural  effusion or pneumothorax.  IMPRESSION: 1. Persistent interstitial prominence  suggesting interstitial pneumonitis. This is most likely superimposed on chronic interstitial lung disease. 2. Left IJ catheters in stable position.   Electronically Signed   By: Marcello Moores  Register   On: 11/17/2013 07:48    Disposition:  01-Home or Self Care  Discharge Orders   Future Appointments Provider Department Dept Phone   05/15/2014 10:45 AM [facility] [phone]   05/15/2014 11:15 AM Amy Milda Smart, PA-C [facility] [phone]   Future Orders Complete By Expires   Discharge to SNF when bed available  As directed        Medication List    STOP taking these medications       albuterol 108 (90 BASE) MCG/ACT inhaler  Commonly known as:  PROVENTIL HFA;VENTOLIN HFA  Replaced by:  albuterol (2.5 MG/3ML) 0.083% nebulizer solution     benzonatate 200 MG capsule  Commonly known as:  TESSALON     Fluticasone-Salmeterol 250-50 MCG/DOSE Aepb  Commonly known as:  ADVAIR     Lysine 500 MG Caps     metoprolol tartrate 25 MG tablet  Commonly known as:  LOPRESSOR     oxyCODONE-acetaminophen 5-325 MG per tablet  Commonly known as:  PERCOCET/ROXICET     PROBIOTIC DAILY PO     tiotropium 18 MCG inhalation capsule  Commonly known as:  SPIRIVA      TAKE these medications       acetaminophen-codeine 300-30 MG per tablet  Commonly known as:  TYLENOL #3  Take 1 tablet by mouth every 6 (six) hours as needed for moderate pain.     albuterol (2.5 MG/3ML) 0.083% nebulizer solution  Commonly known as:  PROVENTIL  Take 3 mLs (2.5 mg total) by nebulization every 2 (two) hours as needed for wheezing.     anastrozole 1 MG tablet  Commonly known as:  ARIMIDEX  Take 1 tablet (1 mg total) by mouth daily.     aspirin EC 81 MG tablet  Take 81 mg by mouth every evening.     bisoprolol 10 MG tablet  Commonly known as:  ZEBETA  Take 2  tablets (20 mg total) by mouth daily.     clonazePAM 0.5 MG tablet  Commonly known as:  KLONOPIN  Take 1 tablet (0.5 mg total) by mouth at bedtime.     clopidogrel 75 MG tablet  Commonly known as:  PLAVIX  Take 75 mg by mouth daily.     docusate sodium 100 MG capsule  Commonly known as:  COLACE  Take 100 mg by mouth daily.     gabapentin 300 MG capsule  Commonly known as:  NEURONTIN  Take 300 mg by mouth at bedtime.     ipratropium-albuterol 0.5-2.5 (3) MG/3ML Soln  Commonly known as:  DUONEB  Take 3 mLs by nebulization every 4 (four) hours.     nitroGLYCERIN 0.4 MG SL tablet  Commonly known as:  NITROSTAT  Place 1 tablet (0.4 mg total) under the tongue every 5 (five) minutes as needed for chest pain (up to 3 doses).     oxybutynin 5 MG tablet  Commonly known as:  DITROPAN  Take 2.5 mg by mouth at bedtime.     pantoprazole 40 MG tablet  Commonly known as:  PROTONIX  Take 40 mg by mouth daily.     predniSONE 10 MG tablet  Commonly known as:  DELTASONE  Take 3 tablets (30 mg total) by mouth daily with breakfast.  simvastatin 40 MG tablet  Commonly known as:  ZOCOR  Take 40 mg by mouth every evening.          Discharged Condition: fair  Time spent on discharge greater than 40 minutes.  Vital signs at Discharge. Temp:  [98.6 F (37 C)-99.9 F (37.7 C)] 98.6 F (37 C) (01/30 0600) Pulse Rate:  [67-78] 70 (01/30 0800) Resp:  [12-21] 15 (01/30 0800) BP: (87-121)/(30-103) 95/42 mmHg (01/30 0800) SpO2:  [91 %-98 %] 96 % (01/30 0813) Office follow up Special Information or instructions. Per [facility] Signed: Richardson Landry Minor ACNP Maryanna Shape PCCM Pager [phone] till 3 pm If no answer page [phone] 11/18/2013, 10:25 AM

## 2013-11-19 LAB — BASIC METABOLIC PANEL
BUN: 24 mg/dL — ABNORMAL HIGH (ref 6–23)
CHLORIDE: 102 meq/L (ref 96–112)
CO2: 34 meq/L — AB (ref 19–32)
Calcium: 8.9 mg/dL (ref 8.4–10.5)
Creatinine, Ser: 0.76 mg/dL (ref 0.50–1.10)
GFR calc Af Amer: >90 mL/min (ref >90)
GFR calc non Af Amer: 82 mL/min — ABNORMAL LOW (ref >90)
Glucose, Bld: 121 mg/dL — ABNORMAL HIGH (ref 70–99)
Potassium: 3.6 mEq/L — ABNORMAL LOW (ref 3.7–5.3)
SODIUM: 145 meq/L (ref 137–147)

## 2013-11-19 LAB — CBC
HEMATOCRIT: 37.3 % (ref 36.0–46.0)
Hemoglobin: 12 g/dL (ref 12.0–15.0)
MCH: 28.8 pg (ref 26.0–34.0)
MCHC: 32.2 g/dL (ref 30.0–36.0)
MCV: 89.4 fL (ref 78.0–100.0)
Platelets: 205 10*3/uL (ref 150–400)
RBC: 4.17 MIL/uL (ref 3.87–5.11)
RDW: 14.1 % (ref 11.5–15.5)
WBC: 11.5 10*3/uL — ABNORMAL HIGH (ref 4.0–10.5)

## 2013-11-19 LAB — PROCALCITONIN: Procalcitonin: <0.1 ng/mL

## 2013-11-19 LAB — HEPATIC FUNCTION PANEL
ALBUMIN: 2.5 g/dL — AB (ref 3.5–5.2)
ALK PHOS: 89 U/L (ref 39–117)
ALT: 32 U/L (ref 0–35)
AST: 17 U/L (ref 0–37)
Bilirubin, Direct: <0.1 mg/dL (ref 0.0–0.3)
Total Bilirubin: 0.3 mg/dL (ref 0.3–1.2)
Total Protein: 6.1 g/dL (ref 6.0–8.3)

## 2013-11-19 LAB — PREALBUMIN: Prealbumin: 21.6 mg/dL (ref 17.0–34.0)

## 2013-11-19 LAB — TSH: TSH: 0.697 u[IU]/mL (ref 0.350–4.500)

## 2013-11-19 LAB — MAGNESIUM: Magnesium: 2.2 mg/dL (ref 1.5–2.5)

## 2013-11-20 LAB — BASIC METABOLIC PANEL
BUN: 32 mg/dL — ABNORMAL HIGH (ref 6–23)
CALCIUM: 9 mg/dL (ref 8.4–10.5)
CO2: 33 mEq/L — ABNORMAL HIGH (ref 19–32)
CREATININE: 0.89 mg/dL (ref 0.50–1.10)
Chloride: 102 mEq/L (ref 96–112)
GFR, EST AFRICAN AMERICAN: 73 mL/min — AB (ref >90)
GFR, EST NON AFRICAN AMERICAN: 63 mL/min — AB (ref >90)
Glucose, Bld: 108 mg/dL — ABNORMAL HIGH (ref 70–99)
Potassium: 3.8 mEq/L (ref 3.7–5.3)
SODIUM: 146 meq/L (ref 137–147)

## 2013-11-21 LAB — COMPREHENSIVE METABOLIC PANEL
ALK PHOS: 95 U/L (ref 39–117)
ALT: 28 U/L (ref 0–35)
AST: 16 U/L (ref 0–37)
Albumin: 2.7 g/dL — ABNORMAL LOW (ref 3.5–5.2)
BUN: 35 mg/dL — ABNORMAL HIGH (ref 6–23)
CALCIUM: 9 mg/dL (ref 8.4–10.5)
CO2: 36 meq/L — AB (ref 19–32)
Chloride: 97 mEq/L (ref 96–112)
Creatinine, Ser: 0.89 mg/dL (ref 0.50–1.10)
GFR calc Af Amer: 73 mL/min — ABNORMAL LOW (ref >90)
GFR, EST NON AFRICAN AMERICAN: 63 mL/min — AB (ref >90)
Glucose, Bld: 99 mg/dL (ref 70–99)
POTASSIUM: 3.4 meq/L — AB (ref 3.7–5.3)
SODIUM: 143 meq/L (ref 137–147)
Total Bilirubin: 0.4 mg/dL (ref 0.3–1.2)
Total Protein: 6.5 g/dL (ref 6.0–8.3)

## 2013-11-21 LAB — CBC WITH DIFFERENTIAL/PLATELET
Basophils Absolute: 0 10*3/uL (ref 0.0–0.1)
Basophils Relative: 0 % (ref 0–1)
Eosinophils Absolute: 0.2 10*3/uL (ref 0.0–0.7)
Eosinophils Relative: 2 % (ref 0–5)
HCT: 39.4 % (ref 36.0–46.0)
Hemoglobin: 12.8 g/dL (ref 12.0–15.0)
LYMPHS ABS: 3.1 10*3/uL (ref 0.7–4.0)
LYMPHS PCT: 22 % (ref 12–46)
MCH: 28.9 pg (ref 26.0–34.0)
MCHC: 32.5 g/dL (ref 30.0–36.0)
MCV: 88.9 fL (ref 78.0–100.0)
Monocytes Absolute: 0.8 10*3/uL (ref 0.1–1.0)
Monocytes Relative: 5 % (ref 3–12)
NEUTROS ABS: 10.1 10*3/uL — AB (ref 1.7–7.7)
NEUTROS PCT: 71 % (ref 43–77)
PLATELETS: 219 10*3/uL (ref 150–400)
RBC: 4.43 MIL/uL (ref 3.87–5.11)
RDW: 13.8 % (ref 11.5–15.5)
WBC: 14.2 10*3/uL — AB (ref 4.0–10.5)

## 2013-11-22 ENCOUNTER — Other Ambulatory Visit (HOSPITAL_COMMUNITY): Payer: Self-pay

## 2013-11-22 LAB — CBC
HEMATOCRIT: 38.3 % (ref 36.0–46.0)
Hemoglobin: 12.5 g/dL (ref 12.0–15.0)
MCH: 28.8 pg (ref 26.0–34.0)
MCHC: 32.6 g/dL (ref 30.0–36.0)
MCV: 88.2 fL (ref 78.0–100.0)
Platelets: 246 10*3/uL (ref 150–400)
RBC: 4.34 MIL/uL (ref 3.87–5.11)
RDW: 13.9 % (ref 11.5–15.5)
WBC: 14.5 10*3/uL — ABNORMAL HIGH (ref 4.0–10.5)

## 2013-11-22 LAB — BASIC METABOLIC PANEL
BUN: 34 mg/dL — AB (ref 6–23)
CALCIUM: 9 mg/dL (ref 8.4–10.5)
CO2: 35 mEq/L — ABNORMAL HIGH (ref 19–32)
Chloride: 97 mEq/L (ref 96–112)
Creatinine, Ser: 0.83 mg/dL (ref 0.50–1.10)
GFR, EST AFRICAN AMERICAN: 80 mL/min — AB (ref >90)
GFR, EST NON AFRICAN AMERICAN: 69 mL/min — AB (ref >90)
GLUCOSE: 108 mg/dL — AB (ref 70–99)
Potassium: 3.8 mEq/L (ref 3.7–5.3)
SODIUM: 143 meq/L (ref 137–147)

## 2013-11-22 NOTE — Discharge Summary (Signed)
Staff MD  See progress notes for details  > 35 min dc planning. To LTAC due to severe deconditioning and ongoing medical needs  Dr. Brand Males, M.D., [facility].C.P Pulmonary and Critical Care Medicine Staff Physician [facility] Pager: [phone], If no answer or between  15:00h - 7:00h: call [phone]  11/22/2013 9:00 PM

## 2013-11-23 LAB — CBC WITH DIFFERENTIAL/PLATELET
BASOS ABS: 0 10*3/uL (ref 0.0–0.1)
BASOS PCT: 0 % (ref 0–1)
EOS ABS: 0.2 10*3/uL (ref 0.0–0.7)
Eosinophils Relative: 1 % (ref 0–5)
HCT: 38 % (ref 36.0–46.0)
Hemoglobin: 12.3 g/dL (ref 12.0–15.0)
Lymphocytes Relative: 21 % (ref 12–46)
Lymphs Abs: 2.9 10*3/uL (ref 0.7–4.0)
MCH: 28.6 pg (ref 26.0–34.0)
MCHC: 32.4 g/dL (ref 30.0–36.0)
MCV: 88.4 fL (ref 78.0–100.0)
Monocytes Absolute: 0.9 10*3/uL (ref 0.1–1.0)
Monocytes Relative: 6 % (ref 3–12)
Neutro Abs: 9.8 10*3/uL — ABNORMAL HIGH (ref 1.7–7.7)
Neutrophils Relative %: 71 % (ref 43–77)
Platelets: 252 10*3/uL (ref 150–400)
RBC: 4.3 MIL/uL (ref 3.87–5.11)
RDW: 14 % (ref 11.5–15.5)
WBC: 13.7 10*3/uL — ABNORMAL HIGH (ref 4.0–10.5)

## 2013-11-23 LAB — BASIC METABOLIC PANEL
BUN: 30 mg/dL — ABNORMAL HIGH (ref 6–23)
CO2: 36 mEq/L — ABNORMAL HIGH (ref 19–32)
Calcium: 9 mg/dL (ref 8.4–10.5)
Chloride: 97 mEq/L (ref 96–112)
Creatinine, Ser: 0.86 mg/dL (ref 0.50–1.10)
GFR calc non Af Amer: 66 mL/min — ABNORMAL LOW (ref >90)
GFR, EST AFRICAN AMERICAN: 76 mL/min — AB (ref >90)
Glucose, Bld: 100 mg/dL — ABNORMAL HIGH (ref 70–99)
POTASSIUM: 3.5 meq/L — AB (ref 3.7–5.3)
Sodium: 142 mEq/L (ref 137–147)

## 2013-11-24 LAB — BASIC METABOLIC PANEL
BUN: 29 mg/dL — ABNORMAL HIGH (ref 6–23)
CHLORIDE: 94 meq/L — AB (ref 96–112)
CO2: 33 mEq/L — ABNORMAL HIGH (ref 19–32)
Calcium: 9 mg/dL (ref 8.4–10.5)
Creatinine, Ser: 0.86 mg/dL (ref 0.50–1.10)
GFR calc non Af Amer: 66 mL/min — ABNORMAL LOW (ref >90)
GFR, EST AFRICAN AMERICAN: 76 mL/min — AB (ref >90)
Glucose, Bld: 133 mg/dL — ABNORMAL HIGH (ref 70–99)
POTASSIUM: 3.5 meq/L — AB (ref 3.7–5.3)
SODIUM: 140 meq/L (ref 137–147)

## 2013-11-25 ENCOUNTER — Ambulatory Visit (HOSPITAL_COMMUNITY)
Admission: RE | Admit: 2013-11-25 | Discharge: 2013-11-26 | Disposition: A | Discharge status: Other Acute Inpatient Hospital | Payer: Medicare Other | Source: Ambulatory Visit | Attending: Cardiology | Admitting: Cardiology

## 2013-11-25 ENCOUNTER — Encounter
Admission: AD | Disposition: A | Discharge status: Other Acute Inpatient Hospital | Payer: Self-pay | Source: Ambulatory Visit | Attending: Internal Medicine

## 2013-11-25 ENCOUNTER — Encounter (HOSPITAL_COMMUNITY)
Admission: RE | Disposition: A | Discharge status: Nursing Home (Includes ICF) | Payer: Self-pay | Source: Ambulatory Visit | Attending: Cardiology

## 2013-11-25 DIAGNOSIS — J9600 Acute respiratory failure, unspecified whether with hypoxia or hypercapnia: Secondary | ICD-10-CM

## 2013-11-25 DIAGNOSIS — E785 Hyperlipidemia, unspecified: Secondary | ICD-10-CM | POA: Diagnosis present

## 2013-11-25 DIAGNOSIS — J9610 Chronic respiratory failure, unspecified whether with hypoxia or hypercapnia: Secondary | ICD-10-CM | POA: Insufficient documentation

## 2013-11-25 DIAGNOSIS — J449 Chronic obstructive pulmonary disease, unspecified: Secondary | ICD-10-CM

## 2013-11-25 DIAGNOSIS — I10 Essential (primary) hypertension: Secondary | ICD-10-CM | POA: Diagnosis present

## 2013-11-25 DIAGNOSIS — E782 Mixed hyperlipidemia: Secondary | ICD-10-CM | POA: Insufficient documentation

## 2013-11-25 DIAGNOSIS — I2589 Other forms of chronic ischemic heart disease: Secondary | ICD-10-CM | POA: Insufficient documentation

## 2013-11-25 DIAGNOSIS — Z9861 Coronary angioplasty status: Secondary | ICD-10-CM | POA: Insufficient documentation

## 2013-11-25 DIAGNOSIS — I2510 Atherosclerotic heart disease of native coronary artery without angina pectoris: Secondary | ICD-10-CM

## 2013-11-25 DIAGNOSIS — J4489 Other specified chronic obstructive pulmonary disease: Secondary | ICD-10-CM | POA: Insufficient documentation

## 2013-11-25 DIAGNOSIS — C50919 Malignant neoplasm of unspecified site of unspecified female breast: Secondary | ICD-10-CM | POA: Insufficient documentation

## 2013-11-25 DIAGNOSIS — I252 Old myocardial infarction: Secondary | ICD-10-CM

## 2013-11-25 DIAGNOSIS — I200 Unstable angina: Secondary | ICD-10-CM

## 2013-11-25 DIAGNOSIS — K219 Gastro-esophageal reflux disease without esophagitis: Secondary | ICD-10-CM | POA: Insufficient documentation

## 2013-11-25 DIAGNOSIS — R079 Chest pain, unspecified: Secondary | ICD-10-CM | POA: Diagnosis present

## 2013-11-25 DIAGNOSIS — J15211 Pneumonia due to Methicillin susceptible Staphylococcus aureus: Secondary | ICD-10-CM | POA: Diagnosis present

## 2013-11-25 DIAGNOSIS — Z8673 Personal history of transient ischemic attack (TIA), and cerebral infarction without residual deficits: Secondary | ICD-10-CM | POA: Insufficient documentation

## 2013-11-25 HISTORY — PX: LEFT HEART CATHETERIZATION WITH CORONARY ANGIOGRAM: SHX5451

## 2013-11-25 SURGERY — LEFT HEART CATHETERIZATION WITH CORONARY ANGIOGRAM
Anesthesia: LOCAL

## 2013-11-25 MED ORDER — ALBUTEROL SULFATE (2.5 MG/3ML) 0.083% IN NEBU: 2.5000 mg | INHALATION_SOLUTION | RESPIRATORY_TRACT | Status: DC | PRN

## 2013-11-25 MED ORDER — CLONAZEPAM 0.5 MG PO TABS
0.5000 mg | ORAL_TABLET | Freq: Every day | ORAL | Status: DC
  Administered 2013-11-25: 0.5 mg via ORAL
  Filled 2013-11-25: qty 1

## 2013-11-25 MED ORDER — BUSPIRONE HCL 5 MG PO TABS
5.0000 mg | ORAL_TABLET | Freq: Two times a day (BID) | ORAL | Status: DC
  Administered 2013-11-25 – 2013-11-26 (×2): 5 mg via ORAL
  Filled 2013-11-25 (×3): qty 1

## 2013-11-25 MED ORDER — NITROGLYCERIN 0.2 MG/ML ON CALL CATH LAB
INTRAVENOUS | Status: AC
  Filled 2013-11-25: qty 1

## 2013-11-25 MED ORDER — VERAPAMIL HCL 2.5 MG/ML IV SOLN
INTRAVENOUS | Status: AC
  Filled 2013-11-25: qty 2

## 2013-11-25 MED ORDER — SODIUM CHLORIDE 0.9 % IV SOLN
INTRAVENOUS | Status: AC
  Administered 2013-11-25: 20:00:00 via INTRAVENOUS

## 2013-11-25 MED ORDER — LIDOCAINE HCL (PF) 1 % IJ SOLN
INTRAMUSCULAR | Status: AC
  Filled 2013-11-25: qty 30

## 2013-11-25 MED ORDER — ACETAMINOPHEN 325 MG PO TABS
650.0000 mg | ORAL_TABLET | ORAL | Status: DC | PRN
Start: 2013-11-25 — End: 2013-11-26

## 2013-11-25 MED ORDER — GABAPENTIN 300 MG PO CAPS
300.0000 mg | ORAL_CAPSULE | Freq: Every day | ORAL | Status: DC
Start: 2013-11-25 — End: 2013-11-26
  Administered 2013-11-25: 300 mg via ORAL
  Filled 2013-11-25 (×2): qty 1

## 2013-11-25 MED ORDER — CLOPIDOGREL BISULFATE 75 MG PO TABS
75.0000 mg | ORAL_TABLET | Freq: Every day | ORAL | Status: DC
  Administered 2013-11-25 – 2013-11-26 (×2): 75 mg via ORAL
  Filled 2013-11-25 (×2): qty 1

## 2013-11-25 MED ORDER — ANASTROZOLE 1 MG PO TABS
1.0000 mg | ORAL_TABLET | Freq: Every day | ORAL | Status: DC
Start: 2013-11-25 — End: 2013-11-26
  Administered 2013-11-25 – 2013-11-26 (×2): 1 mg via ORAL
  Filled 2013-11-25 (×2): qty 1

## 2013-11-25 MED ORDER — ONDANSETRON HCL 4 MG/2ML IJ SOLN: 4.0000 mg | Freq: Four times a day (QID) | INTRAMUSCULAR | Status: DC | PRN

## 2013-11-25 MED ORDER — FENTANYL CITRATE 0.05 MG/ML IJ SOLN
INTRAMUSCULAR | Status: AC
  Filled 2013-11-25: qty 2

## 2013-11-25 MED ORDER — OXYBUTYNIN CHLORIDE 5 MG PO TABS
2.5000 mg | ORAL_TABLET | Freq: Every day | ORAL | Status: DC
  Administered 2013-11-25: 2.5 mg via ORAL
  Filled 2013-11-25 (×2): qty 0.5

## 2013-11-25 MED ORDER — NITROGLYCERIN 0.4 MG SL SUBL: 0.4000 mg | SUBLINGUAL_TABLET | SUBLINGUAL | Status: DC | PRN

## 2013-11-25 MED ORDER — PANTOPRAZOLE SODIUM 40 MG PO TBEC
40.0000 mg | DELAYED_RELEASE_TABLET | Freq: Every day | ORAL | Status: DC
  Administered 2013-11-25 – 2013-11-26 (×2): 40 mg via ORAL
  Filled 2013-11-25 (×2): qty 1

## 2013-11-25 MED ORDER — IPRATROPIUM-ALBUTEROL 0.5-2.5 (3) MG/3ML IN SOLN
3.0000 mL | RESPIRATORY_TRACT | Status: DC
  Administered 2013-11-25 – 2013-11-26 (×3): 3 mL via RESPIRATORY_TRACT
  Filled 2013-11-25 (×3): qty 3

## 2013-11-25 MED ORDER — SIMVASTATIN 40 MG PO TABS
40.0000 mg | ORAL_TABLET | Freq: Every evening | ORAL | Status: DC
Start: 2013-11-25 — End: 2013-11-26
  Administered 2013-11-25: 40 mg via ORAL
  Filled 2013-11-25 (×2): qty 1

## 2013-11-25 MED ORDER — ASPIRIN EC 81 MG PO TBEC
81.0000 mg | DELAYED_RELEASE_TABLET | Freq: Every evening | ORAL | Status: DC
  Administered 2013-11-25: 81 mg via ORAL
  Filled 2013-11-25 (×2): qty 1

## 2013-11-25 MED ORDER — ACETAMINOPHEN-CODEINE #3 300-30 MG PO TABS: 1.0000 | ORAL_TABLET | Freq: Four times a day (QID) | ORAL | Status: DC | PRN

## 2013-11-25 MED ORDER — HEPARIN (PORCINE) IN NACL 2-0.9 UNIT/ML-% IJ SOLN
INTRAMUSCULAR | Status: AC
  Filled 2013-11-25: qty 1000

## 2013-11-25 MED ORDER — PREDNISONE 20 MG PO TABS
30.0000 mg | ORAL_TABLET | Freq: Every day | ORAL | Status: DC
  Administered 2013-11-26: 08:00:00 30 mg via ORAL
  Filled 2013-11-25 (×2): qty 1

## 2013-11-25 MED ORDER — DOCUSATE SODIUM 100 MG PO CAPS
100.0000 mg | ORAL_CAPSULE | Freq: Every day | ORAL | Status: DC
  Administered 2013-11-26: 100 mg via ORAL
  Filled 2013-11-25: qty 1

## 2013-11-25 MED ORDER — HEPARIN SODIUM (PORCINE) 1000 UNIT/ML IJ SOLN
INTRAMUSCULAR | Status: AC
  Filled 2013-11-25: qty 1

## 2013-11-25 MED ORDER — MIDAZOLAM HCL 2 MG/2ML IJ SOLN
INTRAMUSCULAR | Status: AC
  Filled 2013-11-25: qty 2

## 2013-11-25 MED ORDER — BISOPROLOL FUMARATE 10 MG PO TABS
20.0000 mg | ORAL_TABLET | Freq: Every day | ORAL | Status: DC
  Administered 2013-11-25 – 2013-11-26 (×2): 20 mg via ORAL
  Filled 2013-11-25 (×2): qty 2

## 2013-11-25 NOTE — Progress Notes (Signed)
TR BAND REMOVAL  LOCATION:    right radial  DEFLATED PER PROTOCOL:    yes  TIME BAND OFF / DRESSING APPLIED:    2145   SITE UPON ARRIVAL:    Level 0  SITE AFTER BAND REMOVAL:    Level 0  REVERSE ALLEN'S TEST:     positive  CIRCULATION SENSATION AND MOVEMENT:    Within Normal Limits   yes  COMMENTS:   TR band deflated per protocol. Dry gauze dressing applied and covered with clear tegaderm. Patient counseled regarding continuing to limit use of right upper extremity as well as the importance of keeping extremity elevated.

## 2013-11-25 NOTE — CV Procedure (Signed)
    Cardiac Catheterization Procedure Note  Name: [name] MRN: [id number] DOB: June 15, 1941  Procedure: Left Heart Cath, Selective Coronary Angiography, LV angiography  Indication: 73 yo WF with history of CAD and prior stents is seen after prolonged hospitalization with respiratory failure and the flu. She has refractory chest pain. Enzymes are negative.   Procedural Details: The right wrist was prepped, draped, and anesthetized with 1% lidocaine. Using the modified Seldinger technique, a 5 French sheath was introduced into the right radial artery. 3 mg of verapamil was administered through the sheath, weight-based unfractionated heparin was administered intravenously. Standard Judkins catheters were used for selective coronary angiography and left ventriculography. Catheter exchanges were performed over an exchange length guidewire. There were no immediate procedural complications. A TR band was used for radial hemostasis at the completion of the procedure.  The patient was transferred to the post catheterization recovery area for further monitoring.  Procedural Findings: Hemodynamics: AO 114/61 mean 83 mm Hg LV 114/15 mm Hg  Coronary angiography: Coronary dominance: right  Left mainstem: Normal  Left anterior descending (LAD): The LAD is widely patent at the site of the proximal stent. There is focal 40% disease in the mid vessel. The first diagonal has 30-40% disease.  Left circumflex (LCx): The LCx gives off 3 small OM branches. There is a stent in the proximal vessel. The vessel is widely patent.  Right coronary artery (RCA): The RCA has diffuse calcified plaque in the proximal to mid vessel up to 20-30%.  Left ventriculography: Left ventricular systolic function is abnormal, there is distal anteroapical and inferoapical akinesis. LVEF is estimated at 40%, there is no significant mitral regurgitation   Final Conclusions:   1. Nonobstructive CAD 2. Moderate LV  dysfunction.  Recommendations: Continue medical Rx. Will observe overnight. If stable can [facility] am.  Collier Salina [facility] 11/25/2013, 6:34 PM

## 2013-11-25 NOTE — Consult Note (Addendum)
Referring Physician: Hijazi Primary Cardiologist: Angelena Form Reason for Consultation: CP   HPI:  73 y.o. female with history of CAD, s/p post multiple prior stenting procedures, ischemic cardiomyopathy, severe COPD, prior strokes, HTN, HL, GERD, breast cancer . Consult called for CP.   She suffered an anterior MI in 1990 treated with TPA followed by angioplasty. She then had a DES placed in the circumflex in 04/2009. She suffered a stroke in 2009 treated with TPA. She was diagnosed with pericarditis while at the beach in 07/2012. 2 weeks later, she was treated again with TPA for a stroke in the hospital in [place], [place]. She was discharged on Plavix. LHC 08/2012 done for chest pain and abnormal ECG: Proximal LAD 90%, diagonal 30%, circumflex stent patent, mid RCA 50%, EF 35-40%. PCI: Promus DES to proximal LAD. Echo 12/13: EF 40-45% apical hypo-to akinesis. Stress myoview July 2014 with large anterior wall scar but no ischemia.  Admitted 1/20-13/15 with respiratory failure in setting of influenza and COPD flare. After extubation c/o CP by troponins negative. Echo with poor windows. EF "mild to moderately depressed".   Transferred to [facility]. Past 2 nights has had severe substernal CP while in bed. Mild relief with NTG. Says it felt much like her previous MI. ECG with SR with nteroseptal Qs and inferolateral ST elevation and TWI (unchanged from 10/07/12). Was able to walk today without CP.   Review of Systems:     Cardiac Review of Systems: {Y] = yes [ ]  = no  Chest Pain [  y  ]  Resting SOB [ y  ] Exertional SOB  [ y ]  Orthopnea [  ]   Pedal Edema [   ]    Palpitations [  ] Syncope  [  ]   Presyncope [   ]  General Review of Systems: [Y] = yes [  ]=no Constitional: recent weight change [  ]; anorexia [  ]; fatigue [ y ]; nausea [  ]; night sweats [  ]; fever [  ]; or chills [  ];                                                                                                                                           Dental: poor dentition[  ];   Eye : blurred vision [  ]; diplopia [   ]; vision changes [  ];  Amaurosis fugax[  ]; Resp: cough [ y ];  wheezing[  y];  hemoptysis[  ]; shortness of breath[ y ]; paroxysmal nocturnal dyspnea[  ]; dyspnea on exertion[y  ]; or orthopnea[  ];  GI:  gallstones[  ], vomiting[  ];  dysphagia[  ]; melena[  ];  hematochezia [  ]; heartburn[  ];   Hx of  Colonoscopy[  ]; GU: kidney stones [  ]; hematuria[  ];   dysuria [  ];  nocturia[  ];                   Skin: rash, swelling[  ];, hair loss[  ];  peripheral edema[  ];  or itching[  ]; Musculosketetal: myalgias[  ];  joint swelling[  ];  joint erythema[  ];  joint pain[  ];  back pain[  ];  Heme/Lymph: bruising[  ];  bleeding[  ];  anemia[  ];  Neuro: TIA[  ];  headaches[  ];  stroke[y  ];  vertigo[  ];  seizures[  ];   paresthesias[  ];  difficulty walking[  ];  Psych:depression[  ]; anxiety[  ];  Endocrine: diabetes[  ];  thyroid dysfunction[  ];   Other:  Past Medical History  Diagnosis Date  . Other emphysema   . Unspecified arthropathy, shoulder region   . Gastric ulcer, unspecified as acute or chronic, without mention of hemorrhage, perforation, or obstruction   . Benign paroxysmal positional vertigo   . COPD (chronic obstructive pulmonary disease)   . Diverticula of colon   . Osteopenia   . Personal history of colonic polyps 2007    tubular adenoma high grade dysplasia  . Diverticulitis   . Hyperlipemia   . Fracture of ankle, bimalleolar, right, closed   . Hypoxemia   . C. difficile diarrhea   . Varicose vein   . Asthma   . GERD (gastroesophageal reflux disease)   . Hepatitis 1989  . Arthritis     "both shoulders"   . TIA (transient ischemic attack) 04/2008; 08/21/2012    "slight droop left lower lip after 08/21/12 TIA"  . On home oxygen therapy   . Ischemic cardiomyopathy     a. EF 45-50% 2011. b. EF 35-40% by cath 09/01/12.  . Night terrors, adult     dr Leonie Man referred  for PSG  in 2011, and again in 2013   . Breast cancer 04/08/13 bx    left  . Allergy   . CAD (coronary artery disease)     a. ant MI 23 tx with TPA/PTCA. b. Prior stenting procedures including RCA stent, DES to LCx 04/2011. c. [place] -> DES  to prox LAD 08/2012.   Marland Kitchen Hypertension     TAKES FOR H/O STROKE  . CVA (cerebral vascular accident)   . Stroke, acute, embolic     Nov 2025 , [place]  [facility] , was treated with TPA.   . 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 Path G1 IDC, 1.6 cm, neg margin, neg SLN     Medications Prior to Admission  Medication Sig Dispense Refill  . acetaminophen-codeine (TYLENOL #3) 300-30 MG per tablet Take 1 tablet by mouth every 6 (six) hours as needed for moderate pain.      Marland Kitchen albuterol (PROVENTIL) (2.5 MG/3ML) 0.083% nebulizer solution Take 3 mLs (2.5 mg total) by nebulization every 2 (two) hours as needed for wheezing.  75 mL  12  . anastrozole (ARIMIDEX) 1 MG tablet Take 1 tablet (1 mg total) by mouth daily.  90 tablet  3  . aspirin EC 81 MG tablet Take 81 mg by mouth every evening.       . bisoprolol (ZEBETA) 10 MG tablet Take 2 tablets (20 mg total) by mouth daily.      . clonazePAM (KLONOPIN) 0.5 MG tablet Take 1 tablet (  0.5 mg total) by mouth at bedtime.  90 tablet  1  . clopidogrel (PLAVIX) 75 MG tablet Take 75 mg by mouth daily.      Marland Kitchen docusate sodium (COLACE) 100 MG capsule Take 100 mg by mouth daily.      Marland Kitchen gabapentin (NEURONTIN) 300 MG capsule Take 300 mg by mouth at bedtime.      Marland Kitchen ipratropium-albuterol (DUONEB) 0.5-2.5 (3) MG/3ML SOLN Take 3 mLs by nebulization every 4 (four) hours.  360 mL    . nitroGLYCERIN (NITROSTAT) 0.4 MG SL tablet Place 1 tablet (0.4 mg total) under the tongue every 5 (five) minutes as needed for chest pain (up to 3 doses).  25 tablet  4  . oxybutynin (DITROPAN) 5 MG tablet Take 2.5 mg by mouth at bedtime.      .  pantoprazole (PROTONIX) 40 MG tablet Take 40 mg by mouth daily.      . predniSONE (DELTASONE) 10 MG tablet Take 3 tablets (30 mg total) by mouth daily with breakfast.      . simvastatin (ZOCOR) 40 MG tablet Take 40 mg by mouth every evening.           Infusions:   Allergies  Allergen Reactions  . Escitalopram Oxalate Shortness Of Breath, Diarrhea and Nausea And Vomiting    Hot flashes, dizziness  . Indomethacin Other (See Comments)    "made me very very dizzy and made me have vertigo" (08/31/2012)  . Nsaids     Jerking, hot flashes, nausea  . Codeine Nausea And Vomiting    "I can take codeine in my cough medicine" (08/31/2012)  . Sulfonamide Derivatives Other (See Comments)    Drug induced hepatitis in 1989    History   Social History  . Marital Status: Single    Spouse Name: N/A    Number of Children: 3  . 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 Smoking status: Former Smoker -- 1.00 packs/day for 40 years    Types: Cigarettes    Quit date: 10/20/1997  . Smokeless tobacco: Never Used  . Alcohol Use: 1.8 oz/week    3 Glasses of wine per week     Comment: occasionally  . Drug Use: No  . Sexual Activity: Not Currently   Other Topics Concern  . Not on file   Social History Narrative   Patient is single and lives at home alone.   Patient is retired.   Patient has some college .   Patient has 3 children.     Family History  Problem Relation Age of Onset  . Breast cancer Sister     x 2  . Colon cancer Neg Hx   . Diabetes Paternal Grandmother   . Uterine cancer Paternal Grandmother   . Lung cancer Paternal Grandmother   . Heart disease Mother   . Alcohol abuse Mother   . Heart disease Father   . Pancreatic cancer Cousin   . Colon polyps Sister   . Diabetes Maternal Uncle   . Heart disease Maternal Grandfather   . Heart disease Paternal Grandfather   . Stroke Paternal  Grandfather     PHYSICAL EXAM:  110/75 75 No intake or output data in the 24 hours ending 11/25/13 1544  General:  Chronically ill appearing. No respiratory difficulty. Wearing O2 HEENT: normal Neck: supple. no JVD. Carotids 2+ bilat; no  bruits. No lymphadenopathy or thryomegaly appreciated. Cor: PMI nondisplaced. Regular rate & rhythm. No rubs, gallops or murmurs. Lungs: decreased air movement throughout Abdomen: soft, nontender, nondistended. No hepatosplenomegaly. No bruits or masses. Good bowel sounds. Extremities: no cyanosis, clubbing, rash, edema Neuro: alert & oriented x 3, cranial nerves grossly intact. moves all 4 extremities w/o difficulty. Affect pleasant.  ECG: as per HPI  No results found for this or any previous visit (from the past 24 hour(s)). No results found.   ASSESSMENT: 1) CP/USA reminiscent of previous angina 2) CAD as above 3) COPD with chronic resp failure 4) Recent influenza 5) Ischemic cardiomyopathy EF 40%  PLAN/DISCUSSION:  CP is reminiscent of previous MI. However her troponins were negative recently while in hospital for severe respiratory failure requiring intubation. That said, given her history and the quality and severity of CP I think the best plan is cath.  Will try to arrange for today. I discussed with her and her sisters and she agrees. Treat with asa, statin and b-blocker as resp status tolerates.   Garnett Nunziata,MD 3:52 PM

## 2013-11-25 NOTE — Interval H&P Note (Signed)
History and Physical Interval Note:  11/25/2013 6:01 PM  [name]  has presented today for surgery, with the diagnosis of Chest pain  The various methods of treatment have been discussed with the patient and family. After consideration of risks, benefits and other options for treatment, the patient has consented to  Procedure(s): LEFT HEART CATHETERIZATION WITH CORONARY ANGIOGRAM (N/A) as a surgical intervention .  The patient's history has been reviewed, patient examined, no change in status, stable for surgery.  I have reviewed the patient's chart and labs.  Questions were answered to the patient's satisfaction.   Cath Lab Visit (complete for each Cath Lab visit)  Clinical Evaluation Leading to the Procedure:   ACS: yes  Non-ACS:    Anginal Classification: CCS III  Anti-ischemic medical therapy: Minimal Therapy (1 class of medications)  Non-Invasive Test Results: No non-invasive testing performed  Prior CABG: No previous CABG        Collier Salina [facility] 11/25/2013 6:01 PM

## 2013-11-25 NOTE — H&P (View-Only) (Signed)
Referring Physician: Hijazi Primary Cardiologist: Angelena Form Reason for Consultation: CP   HPI:  73 y.o. female with history of CAD, s/p post multiple prior stenting procedures, ischemic cardiomyopathy, severe COPD, prior strokes, HTN, HL, GERD, breast cancer . Consult called for CP.   She suffered an anterior MI in 1990 treated with TPA followed by angioplasty. She then had a DES placed in the circumflex in 04/2009. She suffered a stroke in 2009 treated with TPA. She was diagnosed with pericarditis while at the beach in 07/2012. 2 weeks later, she was treated again with TPA for a stroke in the hospital in [place], [place]. She was discharged on Plavix. LHC 08/2012 done for chest pain and abnormal ECG: Proximal LAD 90%, diagonal 30%, circumflex stent patent, mid RCA 50%, EF 35-40%. PCI: Promus DES to proximal LAD. Echo 12/13: EF 40-45% apical hypo-to akinesis. Stress myoview July 2014 with large anterior wall scar but no ischemia.  Admitted 1/20-13/15 with respiratory failure in setting of influenza and COPD flare. After extubation c/o CP by troponins negative. Echo with poor windows. EF "mild to moderately depressed".   Transferred to [facility]. Past 2 nights has had severe substernal CP while in bed. Mild relief with NTG. Says it felt much like her previous MI. ECG with SR with nteroseptal Qs and inferolateral ST elevation and TWI (unchanged from 10/07/12). Was able to walk today without CP.   Review of Systems:     Cardiac Review of Systems: {Y] = yes [ ]  = no  Chest Pain [  y  ]  Resting SOB [ y  ] Exertional SOB  [ y ]  Orthopnea [  ]   Pedal Edema [   ]    Palpitations [  ] Syncope  [  ]   Presyncope [   ]  General Review of Systems: [Y] = yes [  ]=no Constitional: recent weight change [  ]; anorexia [  ]; fatigue [ y ]; nausea [  ]; night sweats [  ]; fever [  ]; or chills [  ];                                                                                                                                           Dental: poor dentition[  ];   Eye : blurred vision [  ]; diplopia [   ]; vision changes [  ];  Amaurosis fugax[  ]; Resp: cough [ y ];  wheezing[  y];  hemoptysis[  ]; shortness of breath[ y ]; paroxysmal nocturnal dyspnea[  ]; dyspnea on exertion[y  ]; or orthopnea[  ];  GI:  gallstones[  ], vomiting[  ];  dysphagia[  ]; melena[  ];  hematochezia [  ]; heartburn[  ];   Hx of  Colonoscopy[  ]; GU: kidney stones [  ]; hematuria[  ];   dysuria [  ];  nocturia[  ];                   Skin: rash, swelling[  ];, hair loss[  ];  peripheral edema[  ];  or itching[  ]; Musculosketetal: myalgias[  ];  joint swelling[  ];  joint erythema[  ];  joint pain[  ];  back pain[  ];  Heme/Lymph: bruising[  ];  bleeding[  ];  anemia[  ];  Neuro: TIA[  ];  headaches[  ];  stroke[y  ];  vertigo[  ];  seizures[  ];   paresthesias[  ];  difficulty walking[  ];  Psych:depression[  ]; anxiety[  ];  Endocrine: diabetes[  ];  thyroid dysfunction[  ];   Other:  Past Medical History  Diagnosis Date  . Other emphysema   . Unspecified arthropathy, shoulder region   . Gastric ulcer, unspecified as acute or chronic, without mention of hemorrhage, perforation, or obstruction   . Benign paroxysmal positional vertigo   . COPD (chronic obstructive pulmonary disease)   . Diverticula of colon   . Osteopenia   . Personal history of colonic polyps 2007    tubular adenoma high grade dysplasia  . Diverticulitis   . Hyperlipemia   . Fracture of ankle, bimalleolar, right, closed   . Hypoxemia   . C. difficile diarrhea   . Varicose vein   . Asthma   . GERD (gastroesophageal reflux disease)   . Hepatitis 1989  . Arthritis     "both shoulders"   . TIA (transient ischemic attack) 04/2008; 08/21/2012    "slight droop left lower lip after 08/21/12 TIA"  . On home oxygen therapy   . Ischemic cardiomyopathy     a. EF 45-50% 2011. b. EF 35-40% by cath 09/01/12.  . Night terrors, adult     dr Leonie Man referred  for PSG  in 2011, and again in 2013   . Breast cancer 04/08/13 bx    left  . Allergy   . CAD (coronary artery disease)     a. ant MI 79 tx with TPA/PTCA. b. Prior stenting procedures including RCA stent, DES to LCx 04/2011. c. [place] -> DES  to prox LAD 08/2012.   Marland Kitchen Hypertension     TAKES FOR H/O STROKE  . CVA (cerebral vascular accident)   . Stroke, acute, embolic     Nov 9509 , [place]  [facility] , was treated with TPA.   . 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 Path G1 IDC, 1.6 cm, neg margin, neg SLN     Medications Prior to Admission  Medication Sig Dispense Refill  . acetaminophen-codeine (TYLENOL #3) 300-30 MG per tablet Take 1 tablet by mouth every 6 (six) hours as needed for moderate pain.      Marland Kitchen albuterol (PROVENTIL) (2.5 MG/3ML) 0.083% nebulizer solution Take 3 mLs (2.5 mg total) by nebulization every 2 (two) hours as needed for wheezing.  75 mL  12  . anastrozole (ARIMIDEX) 1 MG tablet Take 1 tablet (1 mg total) by mouth daily.  90 tablet  3  . aspirin EC 81 MG tablet Take 81 mg by mouth every evening.       . bisoprolol (ZEBETA) 10 MG tablet Take 2 tablets (20 mg total) by mouth daily.      . clonazePAM (KLONOPIN) 0.5 MG tablet Take 1 tablet (  0.5 mg total) by mouth at bedtime.  90 tablet  1  . clopidogrel (PLAVIX) 75 MG tablet Take 75 mg by mouth daily.      Marland Kitchen docusate sodium (COLACE) 100 MG capsule Take 100 mg by mouth daily.      Marland Kitchen gabapentin (NEURONTIN) 300 MG capsule Take 300 mg by mouth at bedtime.      Marland Kitchen ipratropium-albuterol (DUONEB) 0.5-2.5 (3) MG/3ML SOLN Take 3 mLs by nebulization every 4 (four) hours.  360 mL    . nitroGLYCERIN (NITROSTAT) 0.4 MG SL tablet Place 1 tablet (0.4 mg total) under the tongue every 5 (five) minutes as needed for chest pain (up to 3 doses).  25 tablet  4  . oxybutynin (DITROPAN) 5 MG tablet Take 2.5 mg by mouth at bedtime.      .  pantoprazole (PROTONIX) 40 MG tablet Take 40 mg by mouth daily.      . predniSONE (DELTASONE) 10 MG tablet Take 3 tablets (30 mg total) by mouth daily with breakfast.      . simvastatin (ZOCOR) 40 MG tablet Take 40 mg by mouth every evening.           Infusions:   Allergies  Allergen Reactions  . Escitalopram Oxalate Shortness Of Breath, Diarrhea and Nausea And Vomiting    Hot flashes, dizziness  . Indomethacin Other (See Comments)    "made me very very dizzy and made me have vertigo" (08/31/2012)  . Nsaids     Jerking, hot flashes, nausea  . Codeine Nausea And Vomiting    "I can take codeine in my cough medicine" (08/31/2012)  . Sulfonamide Derivatives Other (See Comments)    Drug induced hepatitis in 1989    History   Social History  . Marital Status: Single    Spouse Name: N/A    Number of Children: 3  . 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 Smoking status: Former Smoker -- 1.00 packs/day for 40 years    Types: Cigarettes    Quit date: 10/20/1997  . Smokeless tobacco: Never Used  . Alcohol Use: 1.8 oz/week    3 Glasses of wine per week     Comment: occasionally  . Drug Use: No  . Sexual Activity: Not Currently   Other Topics Concern  . Not on file   Social History Narrative   Patient is single and lives at home alone.   Patient is retired.   Patient has some college .   Patient has 3 children.     Family History  Problem Relation Age of Onset  . Breast cancer Sister     x 2  . Colon cancer Neg Hx   . Diabetes Paternal Grandmother   . Uterine cancer Paternal Grandmother   . Lung cancer Paternal Grandmother   . Heart disease Mother   . Alcohol abuse Mother   . Heart disease Father   . Pancreatic cancer Cousin   . Colon polyps Sister   . Diabetes Maternal Uncle   . Heart disease Maternal Grandfather   . Heart disease Paternal Grandfather   . Stroke Paternal  Grandfather     PHYSICAL EXAM:  110/75 75 No intake or output data in the 24 hours ending 11/25/13 1544  General:  Chronically ill appearing. No respiratory difficulty. Wearing O2 HEENT: normal Neck: supple. no JVD. Carotids 2+ bilat; no  bruits. No lymphadenopathy or thryomegaly appreciated. Cor: PMI nondisplaced. Regular rate & rhythm. No rubs, gallops or murmurs. Lungs: decreased air movement throughout Abdomen: soft, nontender, nondistended. No hepatosplenomegaly. No bruits or masses. Good bowel sounds. Extremities: no cyanosis, clubbing, rash, edema Neuro: alert & oriented x 3, cranial nerves grossly intact. moves all 4 extremities w/o difficulty. Affect pleasant.  ECG: as per HPI  No results found for this or any previous visit (from the past 24 hour(s)). No results found.   ASSESSMENT: 1) CP/USA reminiscent of previous angina 2) CAD as above 3) COPD with chronic resp failure 4) Recent influenza 5) Ischemic cardiomyopathy EF 40%  PLAN/DISCUSSION:  CP is reminiscent of previous MI. However her troponins were negative recently while in hospital for severe respiratory failure requiring intubation. That said, given her history and the quality and severity of CP I think the best plan is cath.  Will try to arrange for today. I discussed with her and her sisters and she agrees. Treat with asa, statin and b-blocker as resp status tolerates.   Ameri Cahoon,MD 3:52 PM

## 2013-11-26 ENCOUNTER — Inpatient Hospital Stay
Admission: RE | Admit: 2013-11-26 | Discharge: 2013-12-08 | Disposition: A | Discharge status: Skilled Nursing Facility | Payer: Medicare Other | Source: Other Acute Inpatient Hospital | Attending: Internal Medicine | Admitting: Internal Medicine

## 2013-11-26 DIAGNOSIS — J449 Chronic obstructive pulmonary disease, unspecified: Secondary | ICD-10-CM

## 2013-11-26 DIAGNOSIS — I2510 Atherosclerotic heart disease of native coronary artery without angina pectoris: Secondary | ICD-10-CM

## 2013-11-26 DIAGNOSIS — J9600 Acute respiratory failure, unspecified whether with hypoxia or hypercapnia: Secondary | ICD-10-CM

## 2013-11-26 DIAGNOSIS — R079 Chest pain, unspecified: Secondary | ICD-10-CM

## 2013-11-26 LAB — MRSA PCR SCREENING: MRSA BY PCR: NEGATIVE

## 2013-11-26 MED ORDER — ACETAMINOPHEN 325 MG PO TABS: 650.0000 mg | ORAL_TABLET | ORAL | Status: DC | PRN

## 2013-11-26 MED ORDER — FUROSEMIDE 20 MG PO TABS: 20.0000 mg | ORAL_TABLET | Freq: Every day | ORAL | Status: DC

## 2013-11-26 MED ORDER — BUSPIRONE HCL 5 MG PO TABS: 5.0000 mg | ORAL_TABLET | Freq: Two times a day (BID) | ORAL | Status: DC

## 2013-11-26 MED ORDER — POTASSIUM CHLORIDE CRYS ER 20 MEQ PO TBCR
20.0000 meq | EXTENDED_RELEASE_TABLET | Freq: Once | ORAL | Status: AC
  Administered 2013-11-26: 10:00:00 20 meq via ORAL
  Filled 2013-11-26: qty 1

## 2013-11-26 MED ORDER — ALBUTEROL SULFATE (2.5 MG/3ML) 0.083% IN NEBU: 2.5000 mg | INHALATION_SOLUTION | RESPIRATORY_TRACT | Status: DC | PRN

## 2013-11-26 MED ORDER — POTASSIUM CHLORIDE CRYS ER 20 MEQ PO TBCR: 20.0000 meq | EXTENDED_RELEASE_TABLET | Freq: Once | ORAL | Status: DC

## 2013-11-26 MED ORDER — FUROSEMIDE 10 MG/ML IJ SOLN: 20.0000 mg | Freq: Every day | INTRAMUSCULAR | Status: DC

## 2013-11-26 NOTE — Discharge Instructions (Signed)
Call [facility] [phone] if any bleeding, swelling or drainage at cath site.  May shower, no tub baths for 48 hours for groin sticks.  No lifting for 1 week  [employer] Healthy Diet   We added diuretic and potassium to medications.

## 2013-11-26 NOTE — Discharge Summary (Signed)
Physician Discharge Summary       Patient ID: [name] MRN: [id number] DOB/AGE: 11-08-1940 73 y.o.  Admit date: 11/25/2013 Discharge date: 11/26/2013  Discharge Diagnoses:  Active Problems:   HYPERLIPIDEMIA-MIXED   MYOCARDIAL INFARCTION, HX OF   CORONARY ARTERY DISEASE, multiple PCIs in the past, cath 11/25/13 with non obstructive disease continue medical therapy   Hypertension   MSSA (methicillin susceptible Staphylococcus aureus) pneumonia   Chest pain   Discharged Condition: good  Procedures: 11/25/13 cardiac cath with non obstructive disease by Dr. Martinique  Hospital Course: 73 y.o. female with history of CAD, s/p post multiple prior stenting procedures, ischemic cardiomyopathy, severe COPD, prior strokes, HTN, HL, GERD, breast cancer . Consult called for CP on 11/25/13.   She suffered an anterior MI in 1990 treated with TPA followed by angioplasty. She then had a DES placed in the circumflex in 04/2009. She suffered a stroke in 2009 treated with TPA. She [place] pericarditis [place]2012. 2 weeks later, she was treated again with TPA for a stroke in the hospital in [place], [place]. She was discharged on Plavix. LHC 08/2012 done for chest pain and abnormal ECG: Proximal LAD 90%, diagonal 30%, circumflex stent patent, mid RCA 50%, EF 35-40%. PCI: Promus DES to proximal LAD. Echo 12/13: EF 40-45% apical hypo-to akinesis. Stress myoview July 2014 with large anterior wall scar but no ischemia.   Admitted 1/20-13/15 with respiratory failure in setting of influenza and COPD flare. After extubation c/o CP by troponins negative. Echo with poor windows. EF "mild to moderately depressed".  Transferred to [facility]. Past 2 nights has had severe substernal CP while in bed. Mild relief with NTG. Says it felt much like her previous MI. ECG with SR with nteroseptal Qs and inferolateral ST elevation and TWI (unchanged from 10/07/12). Was able to walk today without CP.  It was decided to  pursue cardiac cath,  Pt was brought to [facility] hospital Cath lab and underwent cath revealing: Left mainstem: Normal  Left anterior descending (LAD): The LAD is widely patent at the site of the proximal stent. There is focal 40% disease in the mid vessel. The first diagonal has 30-40% disease.  Left circumflex (LCx): The LCx gives off 3 small OM branches. There is a stent in the proximal vessel. The vessel is widely patent.  Right coronary artery (RCA): The RCA has diffuse calcified plaque in the proximal to mid vessel up to 20-30%.  Left ventriculography: Left ventricular systolic function is abnormal, there is distal anteroapical and inferoapical akinesis. LVEF is estimated at 40%, there is no significant mitral regurgitation   She was kept overnight at [facility] and this AM was seen by Dr. Meda Coffee and found stable for discharge back to [facility].  Low dose lasix was added for mild volume overload.  Once discharged from [facility] she can be followed up with Dr. Angelena Form.   Consults: None  Significant Diagnostic Studies:  BMET    Component Value Date/Time   NA 140 11/24/2013 1054   NA 143 04/20/2013 1221   K 3.5* 11/24/2013 1054   K 3.7 04/20/2013 1221   CL 94* 11/24/2013 1054   CO2 33* 11/24/2013 1054   CO2 27 04/20/2013 1221   GLUCOSE 133* 11/24/2013 1054   GLUCOSE 108 04/20/2013 1221   GLUCOSE 101* 08/04/2006 1026   BUN 29* 11/24/2013 1054   BUN 25.6 04/20/2013 1221   CREATININE 0.86 11/24/2013 1054   CREATININE 0.9 04/20/2013 1221  CREATININE 0.85 07/23/2011 1635   CALCIUM 9.0 11/24/2013 1054   CALCIUM 9.3 04/20/2013 1221   GFRNONAA 66* 11/24/2013 1054   GFRAA 76* 11/24/2013 1054    CBC    Component Value Date/Time   WBC 13.7* 11/23/2013 0600   WBC 8.4 04/20/2013 1221   RBC 4.30 11/23/2013 0600   RBC 4.41 04/20/2013 1221   RBC 3.83* 08/24/2008 0455   HGB 12.3 11/23/2013 0600   HGB 13.5 04/20/2013 1221   HCT 38.0 11/23/2013 0600   HCT 39.6 04/20/2013 1221   PLT 252 11/23/2013 0600   PLT 182 04/20/2013 1221   MCV 88.4  11/23/2013 0600   MCV 89.8 04/20/2013 1221   MCH 28.6 11/23/2013 0600   MCH 30.5 04/20/2013 1221   MCHC 32.4 11/23/2013 0600   MCHC 34.0 04/20/2013 1221   RDW 14.0 11/23/2013 0600   RDW 13.5 04/20/2013 1221   LYMPHSABS 2.9 11/23/2013 0600   LYMPHSABS 1.4 04/20/2013 1221   MONOABS 0.9 11/23/2013 0600   MONOABS 0.7 04/20/2013 1221   EOSABS 0.2 11/23/2013 0600   EOSABS 0.2 04/20/2013 1221   BASOSABS 0.0 11/23/2013 0600   BASOSABS 0.1 04/20/2013 1221       Discharge Exam: Blood pressure 113/56, pulse 61, temperature 97.5 F (36.4 C), temperature source Oral, resp. rate 18, SpO2 100.00%.    Disposition: 82-DC/txfr to short term hosp for IP care with planned acute care IP readmission Pt to go back to [facility].       Future Appointments Provider Department Dept Phone   12/19/2013 2:00 PM Rigoberto Noel, MD [facility] [phone]   05/15/2014 10:45 AM [facility] [phone]   05/15/2014 11:15 AM Amy Fredderick [facility] [phone]       Medication List         acetaminophen 325 MG tablet  Commonly known as:  TYLENOL  Take 2 tablets (650 mg total) by mouth every 4 (four) hours as needed for headache or mild pain.     acetaminophen-codeine 300-30 MG per tablet  Commonly known as:  TYLENOL #3  Take 1 tablet by mouth every 6 (six) hours as needed for moderate pain.     albuterol (2.5 MG/3ML) 0.083% nebulizer solution  Commonly known as:  PROVENTIL  Take 3 mLs (2.5 mg total) by nebulization every 2 (two) hours as needed for wheezing.     ALLEREST MAXIMUM STRENGTH PO  Take 1 tablet by mouth daily.     anastrozole 1 MG tablet  Commonly known as:  ARIMIDEX  Take 1 mg by mouth daily.     aspirin EC 81 MG tablet  Take 81 mg by mouth every evening.     bisoprolol 10 MG tablet  Commonly known as:  ZEBETA  Take 20 mg by mouth daily.     busPIRone 5 MG tablet  Commonly known as:  BUSPAR  Take 1 tablet  (5 mg total) by mouth 2 (two) times daily.     clonazePAM 0.5 MG tablet  Commonly known as:  KLONOPIN  Take 0.5 mg by mouth at bedtime.     clopidogrel 75 MG tablet  Commonly known as:  PLAVIX  Take 75 mg by mouth daily.     CRANBERRY PO  Take 1 tablet by mouth daily.     docusate sodium 100 MG capsule  Commonly known as:  COLACE  Take 100 mg by mouth daily.  Fluticasone-Salmeterol 250-50 MCG/DOSE Aepb  Commonly known as:  ADVAIR  Inhale 1 puff into the lungs 2 (two) times daily.     furosemide 20 MG tablet  Commonly known as:  LASIX  Take 1 tablet (20 mg total) by mouth daily.     gabapentin 300 MG capsule  Commonly known as:  NEURONTIN  Take 300 mg by mouth at bedtime.     ipratropium-albuterol 0.5-2.5 (3) MG/3ML Soln  Commonly known as:  DUONEB  Take 3 mLs by nebulization every 6 (six) hours.     MULTIVITAMIN PO  Take 0.5 tablets by mouth 2 (two) times daily.     nitroGLYCERIN 0.4 MG SL tablet  Commonly known as:  NITROSTAT  Place 0.4 mg under the tongue every 5 (five) minutes as needed for chest pain.     oxybutynin 5 MG tablet  Commonly known as:  DITROPAN  Take 2.5 mg by mouth at bedtime.     pantoprazole 40 MG tablet  Commonly known as:  PROTONIX  Take 40 mg by mouth daily.     predniSONE 10 MG tablet  Commonly known as:  DELTASONE  Take 30 mg by mouth daily with breakfast.     simvastatin 40 MG tablet  Commonly known as:  ZOCOR  Take 40 mg by mouth every evening.     tiotropium 18 MCG inhalation capsule  Commonly known as:  SPIRIVA  Place 18 mcg into inhaler and inhale daily.     Vitamin D 2000 UNITS tablet  Take 2,000 Units by mouth 2 (two) times daily.       Follow-up Information   Follow up with Lauree Chandler, MD. (our office will call with date and time for appt.)    Specialty:  Cardiology   Contact information:   [address] [place] [address] [phone]        Discharge Instructions: Call [facility] [phone] if any bleeding, swelling or drainage at cath site.  May shower, no tub baths for 48 hours for groin sticks.  No lifting for 1 week  [place] Healthy Diet   We added diuretic and potassium to medications.  Signed: Isaiah Serge Nurse Practitioner-Certified [facility]: [facility] 11/26/2013, 9:04 AM  Time spent on discharge : greater than 30 min.    Ena Dawley, Lemmie Evens 11/26/2013

## 2013-11-26 NOTE — Progress Notes (Signed)
   Patient Name: [name] Date of Encounter: 11/26/2013  Active Problems:   Chest pain   Length of Stay: 1  SUBJECTIVE  No chest pain, no SOB.   CURRENT MEDS . anastrozole  1 mg Oral Daily  . aspirin EC  81 mg Oral QPM  . bisoprolol  20 mg Oral Daily  . busPIRone  5 mg Oral BID  . clonazePAM  0.5 mg Oral QHS  . clopidogrel  75 mg Oral Daily  . docusate sodium  100 mg Oral Daily  . gabapentin  300 mg Oral QHS  . ipratropium-albuterol  3 mL Nebulization Q4H  . oxybutynin  2.5 mg Oral QHS  . pantoprazole  40 mg Oral Daily  . predniSONE  30 mg Oral Q breakfast  . simvastatin  40 mg Oral QPM   OBJECTIVE  Filed Vitals:   11/26/13 0000 11/26/13 0229 11/26/13 0400 11/26/13 0740  BP: 132/47  141/55 113/56  Pulse: 65  59 61  Temp: 97.4 F (36.3 C)  97.3 F (36.3 C) 97.5 F (36.4 C)  TempSrc: Oral  Oral Oral  Resp: 18  18 18   SpO2: 100% 100% 100% 100%    Intake/Output Summary (Last 24 hours) at 11/26/13 0813 Last data filed at 11/26/13 0741  Gross per 24 hour  Intake 443.75 ml  Output    700 ml  Net -256.25 ml   PHYSICAL EXAM  General: Pleasant, NAD. Neuro: Alert and oriented X 3. Moves all extremities spontaneously. Psych: Normal affect. HEENT:  Normal  Neck: Supple without bruits or JVD. Lungs:  Resp regular and unlabored, crackles at the bases. Heart: RRR no s3, s4, or murmurs. Abdomen: Soft, non-tender, non-distended, BS + x 4.  Extremities: No clubbing, cyanosis or edema. DP/PT/Radials 2+ and equal bilaterally.  Accessory Clinical Findings  Basic Metabolic Panel  Recent Labs  11/24/13 1054  NA 140  K 3.5*  CL 94*  CO2 33*  GLUCOSE 133*  BUN 29*  CREATININE 0.86  CALCIUM 9.0   TELE: SR, 1 episode of nsVT - 4 beats followed by 7 beats of a-fib    ASSESSMENT AND PLAN  73 y.o. female with history of CAD, s/p post multiple prior stenting procedures, ischemic cardiomyopathy, severe COPD, prior strokes, HTN, HL, GERD, breast cancer . Consult  called for CP.   1. CP/USA, cath yesterday showed non-obstructive CAD, continue ASA, stop Plavix, bisoprolol and simvastatin   2. Ischemic cardiomyopathy - Moderate LV dysfunction, add Lisinopril 2.5 mg po daily once tolerated by BP. We will add a low dose lasix 20 mg po daily as she is mildly fluid overloaded.   3. nsVT - continue bisoprolol  4. COPD with chronic resp failure   5. Recent influenza   The patient can be transferred to [facility] today.   Signed, Ena Dawley, H MD, [facility] 11/26/2013

## 2013-11-28 DIAGNOSIS — I2510 Atherosclerotic heart disease of native coronary artery without angina pectoris: Secondary | ICD-10-CM

## 2013-11-28 LAB — CBC WITH DIFFERENTIAL/PLATELET
BASOS ABS: 0 10*3/uL (ref 0.0–0.1)
BASOS PCT: 0 % (ref 0–1)
EOS PCT: 2 % (ref 0–5)
Eosinophils Absolute: 0.2 10*3/uL (ref 0.0–0.7)
HEMATOCRIT: 36.4 % (ref 36.0–46.0)
Hemoglobin: 12 g/dL (ref 12.0–15.0)
Lymphocytes Relative: 25 % (ref 12–46)
Lymphs Abs: 2.8 10*3/uL (ref 0.7–4.0)
MCH: 29.6 pg (ref 26.0–34.0)
MCHC: 33 g/dL (ref 30.0–36.0)
MCV: 89.7 fL (ref 78.0–100.0)
MONO ABS: 0.6 10*3/uL (ref 0.1–1.0)
Monocytes Relative: 5 % (ref 3–12)
Neutro Abs: 7.5 10*3/uL (ref 1.7–7.7)
Neutrophils Relative %: 68 % (ref 43–77)
PLATELETS: 195 10*3/uL (ref 150–400)
RBC: 4.06 MIL/uL (ref 3.87–5.11)
RDW: 14.3 % (ref 11.5–15.5)
WBC: 11.1 10*3/uL — ABNORMAL HIGH (ref 4.0–10.5)

## 2013-11-28 LAB — COMPREHENSIVE METABOLIC PANEL
ALBUMIN: 3 g/dL — AB (ref 3.5–5.2)
ALT: 22 U/L (ref 0–35)
AST: 16 U/L (ref 0–37)
Alkaline Phosphatase: 94 U/L (ref 39–117)
BUN: 22 mg/dL (ref 6–23)
CALCIUM: 9 mg/dL (ref 8.4–10.5)
CO2: 31 meq/L (ref 19–32)
CREATININE: 0.87 mg/dL (ref 0.50–1.10)
Chloride: 100 mEq/L (ref 96–112)
GFR calc Af Amer: 75 mL/min — ABNORMAL LOW (ref >90)
GFR calc non Af Amer: 65 mL/min — ABNORMAL LOW (ref >90)
Glucose, Bld: 128 mg/dL — ABNORMAL HIGH (ref 70–99)
Potassium: 3.9 mEq/L (ref 3.7–5.3)
Sodium: 142 mEq/L (ref 137–147)
TOTAL PROTEIN: 6.6 g/dL (ref 6.0–8.3)
Total Bilirubin: 0.3 mg/dL (ref 0.3–1.2)

## 2013-11-28 LAB — PREALBUMIN: Prealbumin: 27.1 mg/dL (ref 17.0–34.0)

## 2013-11-30 ENCOUNTER — Other Ambulatory Visit (HOSPITAL_COMMUNITY): Payer: Medicare Other

## 2013-12-03 LAB — CBC
HEMATOCRIT: 33.6 % — AB (ref 36.0–46.0)
HEMOGLOBIN: 10.7 g/dL — AB (ref 12.0–15.0)
MCH: 28.5 pg (ref 26.0–34.0)
MCHC: 31.8 g/dL (ref 30.0–36.0)
MCV: 89.4 fL (ref 78.0–100.0)
PLATELETS: 167 10*3/uL (ref 150–400)
RBC: 3.76 MIL/uL — ABNORMAL LOW (ref 3.87–5.11)
RDW: 14.7 % (ref 11.5–15.5)
WBC: 10.5 10*3/uL (ref 4.0–10.5)

## 2013-12-03 LAB — BASIC METABOLIC PANEL
BUN: 26 mg/dL — AB (ref 6–23)
CALCIUM: 9 mg/dL (ref 8.4–10.5)
CHLORIDE: 98 meq/L (ref 96–112)
CO2: 34 meq/L — AB (ref 19–32)
CREATININE: 0.75 mg/dL (ref 0.50–1.10)
GFR calc Af Amer: >90 mL/min (ref >90)
GFR calc non Af Amer: 83 mL/min — ABNORMAL LOW (ref >90)
GLUCOSE: 90 mg/dL (ref 70–99)
Potassium: 4.3 mEq/L (ref 3.7–5.3)
Sodium: 141 mEq/L (ref 137–147)

## 2013-12-05 LAB — CBC
HEMATOCRIT: 33.3 % — AB (ref 36.0–46.0)
Hemoglobin: 10.7 g/dL — ABNORMAL LOW (ref 12.0–15.0)
MCH: 28.5 pg (ref 26.0–34.0)
MCHC: 32.1 g/dL (ref 30.0–36.0)
MCV: 88.8 fL (ref 78.0–100.0)
Platelets: 151 10*3/uL (ref 150–400)
RBC: 3.75 MIL/uL — ABNORMAL LOW (ref 3.87–5.11)
RDW: 14.8 % (ref 11.5–15.5)
WBC: 9.9 10*3/uL (ref 4.0–10.5)

## 2013-12-05 LAB — BASIC METABOLIC PANEL
BUN: 26 mg/dL — ABNORMAL HIGH (ref 6–23)
CALCIUM: 8.8 mg/dL (ref 8.4–10.5)
CO2: 34 meq/L — AB (ref 19–32)
CREATININE: 0.8 mg/dL (ref 0.50–1.10)
Chloride: 97 mEq/L (ref 96–112)
GFR calc non Af Amer: 72 mL/min — ABNORMAL LOW (ref >90)
GFR, EST AFRICAN AMERICAN: 83 mL/min — AB (ref >90)
Glucose, Bld: 94 mg/dL (ref 70–99)
Potassium: 4.1 mEq/L (ref 3.7–5.3)
Sodium: 138 mEq/L (ref 137–147)

## 2013-12-09 ENCOUNTER — Non-Acute Institutional Stay (SKILLED_NURSING_FACILITY): Payer: Medicare Other | Admitting: Adult Health

## 2013-12-09 DIAGNOSIS — F411 Generalized anxiety disorder: Secondary | ICD-10-CM

## 2013-12-09 DIAGNOSIS — J449 Chronic obstructive pulmonary disease, unspecified: Secondary | ICD-10-CM

## 2013-12-09 DIAGNOSIS — C50912 Malignant neoplasm of unspecified site of left female breast: Secondary | ICD-10-CM

## 2013-12-09 DIAGNOSIS — F419 Anxiety disorder, unspecified: Secondary | ICD-10-CM

## 2013-12-09 DIAGNOSIS — R531 Weakness: Secondary | ICD-10-CM

## 2013-12-09 DIAGNOSIS — E785 Hyperlipidemia, unspecified: Secondary | ICD-10-CM

## 2013-12-09 DIAGNOSIS — R5383 Other fatigue: Secondary | ICD-10-CM

## 2013-12-09 DIAGNOSIS — K5900 Constipation, unspecified: Secondary | ICD-10-CM

## 2013-12-09 DIAGNOSIS — N760 Acute vaginitis: Secondary | ICD-10-CM

## 2013-12-09 DIAGNOSIS — I10 Essential (primary) hypertension: Secondary | ICD-10-CM

## 2013-12-09 DIAGNOSIS — E46 Unspecified protein-calorie malnutrition: Secondary | ICD-10-CM

## 2013-12-09 DIAGNOSIS — R5381 Other malaise: Secondary | ICD-10-CM

## 2013-12-09 DIAGNOSIS — C50919 Malignant neoplasm of unspecified site of unspecified female breast: Secondary | ICD-10-CM

## 2013-12-13 ENCOUNTER — Encounter: Payer: Self-pay | Admitting: Adult Health

## 2013-12-13 DIAGNOSIS — F419 Anxiety disorder, unspecified: Secondary | ICD-10-CM | POA: Insufficient documentation

## 2013-12-13 DIAGNOSIS — N760 Acute vaginitis: Secondary | ICD-10-CM | POA: Insufficient documentation

## 2013-12-13 NOTE — Progress Notes (Signed)
Patient ID: [name], female   DOB: 10-07-1941, 73 y.o.   MRN: [id number]               PROGRESS NOTE  DATE: 12/09/2013  FACILITY: Nursing Home Location: [facility]  LEVEL OF CARE: SNF (31)  Acute Visit  CHIEF COMPLAINT:  Follow-up Hospitalization  HISTORY OF PRESENT ILLNESS: This is a 73 year old female who has been admitted to [facility] on 12/08/13 from [facility] with principal diagnoses of Acute on chronic respiratory failure, chronic obstructive pulmonary disease, S/P exacerbation, Generalized weakness and protein-calorie malnutrition. She has been admitted for a short-term rehabilitation.  REASSESSMENT OF ONGOING PROBLEM(S):  HTN: Pt 's HTN remains stable.  Denies CP, sob, DOE, pedal edema, headaches, dizziness or visual disturbances.  No complications from the medications currently being used.  Last BP : 120/73  COPD: the COPD remains stable.  Pt denies sob, cough, wheezing or declining exercise tolerance.  No complications from the medications presently being used.  CONSTIPATION: The constipation remains stable. No complications from the medications presently being used. Patient denies ongoing constipation, abdominal pain, nausea or vomiting.   PAST MEDICAL HISTORY : Reviewed.  No changes.  CURRENT MEDICATIONS: Reviewed per [facility]  REVIEW OF SYSTEMS:  GENERAL: no change in appetite, no fatigue, no weight changes, no fever, chills or weakness RESPIRATORY: no cough, SOB, DOE, wheezing, hemoptysis CARDIAC: no chest pain, edema or palpitations GI: no abdominal pain, diarrhea, constipation, heart burn, nausea or vomiting  PHYSICAL EXAMINATION  GENERAL: no acute distress, normal body habitus EYES: conjunctivae normal, sclerae normal, normal eye lids NECK: supple, trachea midline, no neck masses, no thyroid tenderness, no thyromegaly LYMPHATICS: no LAN in the neck, no supraclavicular LAN RESPIRATORY: breathing is even & unlabored, BS  CTAB CARDIAC: RRR, no murmur,no extra heart sounds, no edema GI: abdomen soft, normal BS, no masses, no tenderness, no hepatomegaly, no splenomegaly PSYCHIATRIC: the patient is alert & oriented to person, affect & behavior appropriate  LABS/RADIOLOGY: Labs reviewed: Basic Metabolic Panel:  Recent Labs  11/16/13 0445  11/17/13 0515 11/18/13 0400 11/19/13 0500  11/28/13 0500 12/03/13 0500 12/05/13 0500  NA 141  < > 144 142 145  < > 142 141 138  K 3.6*  < > 4.1 4.0 3.6*  < > 3.9 4.3 4.1  CL 97  < > 101 99 102  < > 100 98 97  CO2 37*  < > 34* 33* 34*  < > 31 34* 34*  GLUCOSE 113*  < > 92 106* 121*  < > 128* 90 94  BUN 32*  < > 31* 26* 24*  < > 22 26* 26*  CREATININE 0.65  < > 0.82 0.78 0.76  < > 0.87 0.75 0.80  CALCIUM 8.5  < > 8.6 9.0 8.9  < > 9.0 9.0 8.8  MG 2.2  < > 2.2 2.2 2.2  --   --   --   --   PHOS 4.0  --  4.2 5.0*  --   --   --   --   --   < > = values in this interval not displayed. Liver Function Tests:  Recent Labs  11/19/13 0500 11/21/13 0500 11/28/13 0500  AST 17 16 16   ALT 32 28 22  ALKPHOS 89 95 94  BILITOT 0.3 0.4 0.3  PROT 6.1 6.5 6.6  ALBUMIN 2.5* 2.7* 3.0*   CBC:  Recent Labs  11/21/13 0500  11/23/13 0600 11/28/13 0500  12/03/13 0500 12/05/13 0500  WBC 14.2*  < > 13.7* 11.1* 10.5 9.9  NEUTROABS 10.1*  --  9.8* 7.5  --   --   HGB 12.8  < > 12.3 12.0 10.7* 10.7*  HCT 39.4  < > 38.0 36.4 33.6* 33.3*  MCV 88.9  < > 88.4 89.7 89.4 88.8  PLT 219  < > 252 195 167 151  < > = values in this interval not displayed.   Lipid Panel:  Recent Labs  08/31/13 1213  HDL 43.00   Cardiac Enzymes:  Recent Labs  11/15/13 0545  11/16/13 1746 11/16/13 2220 11/17/13 0515  CKTOTAL 47  --   --   --   --   CKMB 2.4  --   --   --   --   TROPONINI  --   < > <0.30 <0.30 <0.30  < > = values in this interval not displayed.   CBG:  Recent Labs  11/16/13 1954 11/16/13 2308 11/17/13 0346  GLUCAP 104* 120* 79     ASSESSMENT/PLAN:  COPD -  stable Generalized weakness - for rehabilitation Protein calorie malnutrition - continue supplementation Hypertension - well-controlled Hyperlipidemia - continue Lipitor Constipation - no complaints Breast cancer - continue Arimidex Vaginitis - continue Clotrimazole with betamethasone and Miconazole Anxiety - stable    CPT CODE: 18563  Nasser Ku Vargas - NP [facility] [phone]

## 2013-12-19 ENCOUNTER — Inpatient Hospital Stay: Payer: Medicare Other | Admitting: Pulmonary Disease

## 2013-12-20 ENCOUNTER — Other Ambulatory Visit: Payer: Self-pay | Admitting: *Deleted

## 2013-12-20 MED ORDER — TRAMADOL HCL 50 MG PO TABS: ORAL_TABLET | ORAL | Status: DC

## 2013-12-20 MED ORDER — CLONAZEPAM 0.5 MG PO TABS: ORAL_TABLET | ORAL | Status: DC

## 2013-12-20 NOTE — Telephone Encounter (Signed)
[facility] 

## 2013-12-22 ENCOUNTER — Other Ambulatory Visit: Payer: Self-pay | Admitting: *Deleted

## 2013-12-22 MED ORDER — ZOLPIDEM TARTRATE 5 MG PO TABS: 5.0000 mg | ORAL_TABLET | Freq: Every evening | ORAL | Status: DC | PRN

## 2013-12-22 NOTE — Telephone Encounter (Signed)
[facility] 

## 2013-12-23 ENCOUNTER — Ambulatory Visit (INDEPENDENT_AMBULATORY_CARE_PROVIDER_SITE_OTHER): Payer: Medicare Other | Admitting: Cardiovascular Disease

## 2013-12-23 ENCOUNTER — Encounter: Payer: Self-pay | Admitting: Cardiovascular Disease

## 2013-12-23 VITALS — BP 132/78 | HR 76 | Ht 64.0 in | Wt 162.0 lb

## 2013-12-23 DIAGNOSIS — E785 Hyperlipidemia, unspecified: Secondary | ICD-10-CM

## 2013-12-23 DIAGNOSIS — I2589 Other forms of chronic ischemic heart disease: Secondary | ICD-10-CM

## 2013-12-23 DIAGNOSIS — I255 Ischemic cardiomyopathy: Secondary | ICD-10-CM

## 2013-12-23 DIAGNOSIS — I2510 Atherosclerotic heart disease of native coronary artery without angina pectoris: Secondary | ICD-10-CM

## 2013-12-23 DIAGNOSIS — I10 Essential (primary) hypertension: Secondary | ICD-10-CM

## 2013-12-23 MED ORDER — PANTOPRAZOLE SODIUM 40 MG PO TBEC: 40.0000 mg | DELAYED_RELEASE_TABLET | Freq: Two times a day (BID) | ORAL | Status: AC

## 2013-12-23 MED ORDER — CLOPIDOGREL BISULFATE 75 MG PO TABS: 75.0000 mg | ORAL_TABLET | Freq: Every day | ORAL | Status: DC

## 2013-12-23 NOTE — Progress Notes (Signed)
History of Present Illness: 73 y.o. female with history of CAD, s/p post multiple prior stenting procedures, ischemic cardiomyopathy, severe COPD, prior strokes, HTN, HL, GERD, breast cancer here today for cardiac follow up. She suffered an anterior MI in 1990 treated with TPA followed by angioplasty. She then had a DES placed in the circumflex in 04/2009. She suffered a stroke in 2009 treated with TPA. She was diagnosed with pericarditis [place] the beach [place] 07[place]2012. 2 weeks later, she was treated again with TPA for a stroke in the hospital in [place], [place]. She was discharged on Plavix. LHC 08/2012 done for chest pain: Proximal LAD 90%, diagonal 30%, circumflex stent patent, mid RCA 50%, EF 35-40%. PCI: Promus DES to proximal LAD. Echo 12/13: EF 40-45% apical hypo-to akinesis. Stress myoview July 2014 with large anterior wall scar but no ischemia. Left lumpectomy August 2014 with infection of spacer and repeat surgery. Admitted 11/08/13-11/18/13 with respiratory failure in setting of influenza and COPD flare. She was intubated and managed by PCCM. After extubation c/o CP but troponins negative. Echo with poor windows. EF "mild to moderately depressed". Cardiac cath 11/25/13 with stable disease. She was sent to [facility] and is now at [facility].   She is here today for hospital follow up. She continues to have almost daily episodes of chest burning. This feels like GERD. She has dyspnea with minimal exertion.  No syncope. No orthopnea, PND, edema.   Primary Care Physician: Dr. Linna Darner Pulmonary: Dr. [name]  Last Lipid Profile:Lipid Panel     Component Value Date/Time   CHOL 137 08/31/2013 1213   TRIG 80 11/14/2013 0640   HDL 43.00 08/31/2013 1213   CHOLHDL 3 08/31/2013 1213   VLDL 14.0 08/31/2013 1213   Edgar 80 08/31/2013 1213     Past Medical History  Diagnosis Date  . Other emphysema   . Unspecified arthropathy, shoulder region   . Gastric ulcer, unspecified as acute or chronic,  without mention of hemorrhage, perforation, or obstruction   . Benign paroxysmal positional vertigo   . COPD (chronic obstructive pulmonary disease)   . Diverticula of colon   . Osteopenia   . Personal history of colonic polyps 2007    tubular adenoma high grade dysplasia  . Diverticulitis   . Hyperlipemia   . Fracture of ankle, bimalleolar, right, closed   . Hypoxemia   . C. difficile diarrhea   . Varicose vein   . Asthma   . GERD (gastroesophageal reflux disease)   . Hepatitis 1989  . Arthritis     "both shoulders"   . TIA (transient ischemic attack) 04/2008; 08/21/2012    "slight droop left lower lip after 08/21/12 TIA"  . On home oxygen therapy   . Ischemic cardiomyopathy     a. EF 45-50% 2011. b. EF 35-40% by cath 09/01/12.  . Night terrors, adult     dr Leonie Man referred  for PSG in 2011, and again in 2013   . Breast cancer 04/08/13 bx    left  . Allergy   . CAD (coronary artery disease)     a. ant MI 57 tx with TPA/PTCA. b. Prior stenting procedures including RCA stent, DES to LCx 04/2011. c. [place] -> DES  to prox LAD 08/2012.   Marland Kitchen Hypertension     TAKES FOR H/O STROKE  . CVA (cerebral vascular accident)   . Stroke, acute, embolic     Nov 9179 , [place] , was treated with  TPA.   . 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 Path G1 IDC, 1.6 cm, neg margin, neg SLN     Past Surgical History  Procedure Laterality Date  . Total shoulder arthroplasty  2012    left  . Orif ankle fracture  2007    right  . Martial megeti  0240'X    "bladder operation; not successful " (08/31/2012)  . Varicose vein surgery  1983; 1985    "both legs both times" (08/31/2012)  . Total shoulder replacement  12/05/2010    Dr Onnie Graham  . Abdominal hysterectomy      TAH w/BSO  . Augmentation mammaplasty  1975  . Coronary angioplasty with stent placement  2010  . Coronary  angioplasty  1990    "3 times" (08/31/2012)  . Colon surgery  07/07/2011    Dr. Hassell Done  . Appendectomy  1960  . Nose surgery      1977  . Breast augmenttation    . Catarct  right      RIGHT EYE  . Breast lumpectomy with needle localization and axillary sentinel lymph node bx Left 06/02/2013    Procedure: BREAST LUMPECTOMY WITH NEEDLE LOCALIZATION AND AXILLARY SENTINEL LYMPH NODE BX;  Surgeon: Haywood Lasso, MD;  Location: [facility];  Service: General;  Laterality: Left;  NEEDLE LOC BCG 7:30 NUC MED 9:30 RECONSTRUCTION TO FOLLOW 1 HOUR added   . Breast implant removal Left 06/02/2013    Procedure: REMOVAL BREAST IMPLANTS; LEFT BREAST CAPSULECTOMY WITH PLACEMENT OF TISSUE EXPANDER AND USE OF FLEX HD;  Surgeon: Crissie Reese, MD;  Location: [facility];  Service: Plastics;  Laterality: Left;    Current Outpatient Prescriptions  Medication Sig Dispense Refill  . acetaminophen (TYLENOL) 325 MG tablet Take 2 tablets (650 mg total) by mouth every 4 (four) hours as needed for headache or mild pain.      Marland Kitchen albuterol (PROVENTIL) (2.5 MG/3ML) 0.083% nebulizer solution Take 3 mLs (2.5 mg total) by nebulization every 2 (two) hours as needed for wheezing.  75 mL  12  . anastrozole (ARIMIDEX) 1 MG tablet Take 1 mg by mouth daily.      Marland Kitchen aspirin EC 81 MG tablet Take 81 mg by mouth every evening.       Marland Kitchen atorvastatin (LIPITOR) 20 MG tablet Take 20 mg by mouth at bedtime.      . bisoprolol (ZEBETA) 10 MG tablet Take 20 mg by mouth daily.      . budesonide (PULMICORT) 0.5 MG/2ML nebulizer solution Take 0.5 mg by nebulization 2 (two) times daily.      . busPIRone (BUSPAR) 5 MG tablet Take 1 tablet (5 mg total) by mouth 2 (two) times daily.  60 tablet  6  . Cholecalciferol (VITAMIN D) 2000 UNITS tablet Take 2,000 Units by mouth 2 (two) times daily.      . clonazePAM (KLONOPIN) 0.5 MG tablet Take 1/4 tablet by mouth every morning for anxiety  30 tablet  5  . clotrimazole-betamethasone (LOTRISONE) cream Apply 1  application topically 3 (three) times daily.      Marland Kitchen CRANBERRY PO Take 1 tablet by mouth daily.      Marland Kitchen docusate sodium (COLACE) 100 MG capsule Take 100 mg by mouth daily.      Marland Kitchen enoxaparin (LOVENOX) 40 MG/0.4ML injection Inject 40 mg into the skin daily.      Marland Kitchen  Fluticasone Furoate-Vilanterol (BREO ELLIPTA IN) Inhale 1 puff into the lungs daily.      . furosemide (LASIX) 20 MG tablet Take 1 tablet (20 mg total) by mouth daily.  30 tablet  6  . gabapentin (NEURONTIN) 300 MG capsule Take 300 mg by mouth at bedtime.      Marland Kitchen ipratropium-albuterol (DUONEB) 0.5-2.5 (3) MG/3ML SOLN Take 3 mLs by nebulization every 6 (six) hours.      . lidocaine (LIDODERM) 5 % Place 1 patch onto the skin daily. Remove & Discard patch within 12 hours or as directed by MD      . loratadine (CLARITIN) 10 MG tablet Take 10 mg by mouth daily.      . miconazole (MICOTIN) 2 % cream Apply 1 application topically at bedtime.      . Mouthwashes (BIOTENE DRY MOUTH MT) Use as directed 1 application in the mouth or throat 2 (two) times daily.      . Multiple Vitamins-Minerals (MULTIVITAMIN PO) Take 0.5 tablets by mouth 2 (two) times daily.      . nitroGLYCERIN (NITROSTAT) 0.4 MG SL tablet Place 0.4 mg under the tongue every 5 (five) minutes as needed for chest pain.      Marland Kitchen oxybutynin (DITROPAN) 5 MG tablet Take 2.5 mg by mouth at bedtime.      . pantoprazole (PROTONIX) 40 MG tablet Take 40 mg by mouth daily.      . polyethylene glycol (MIRALAX / GLYCOLAX) packet Take 17 g by mouth daily.      . potassium chloride SA (K-DUR,KLOR-CON) 20 MEQ tablet Take 1 tablet (20 mEq total) by mouth once.  30 tablet  6  . predniSONE (DELTASONE) 10 MG tablet Take 30 mg by mouth daily with breakfast.      . sodium chloride (OCEAN) 0.65 % SOLN nasal spray Place 1 spray into both nostrils 3 (three) times daily.      Marland Kitchen tiotropium (SPIRIVA) 18 MCG inhalation capsule Place 18 mcg into inhaler and inhale daily.      . traMADol (ULTRAM) 50 MG tablet Take 1/2  tablet by mouth every 6 hours as needed for breakthrough pain  60 tablet  5   No current facility-administered medications for this visit.    Allergies  Allergen Reactions  . Escitalopram Oxalate Shortness Of Breath, Diarrhea and Nausea And Vomiting    Hot flashes, dizziness  . Indomethacin Other (See Comments)    "made me very very dizzy and made me have vertigo" (08/31/2012)  . Nsaids     Jerking, hot flashes, nausea  . Codeine Nausea And Vomiting    "I can take codeine in my cough medicine" (08/31/2012)  . Sulfonamide Derivatives Other (See Comments)    Drug induced hepatitis in 1989    History   Social History  . Marital Status: Divorced    Spouse Name: N/A    Number of Children: 3  . 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 Smoking status: Former Smoker -- 1.00 packs/day for 40 years    Types: Cigarettes    Quit date: 10/20/1997  . Smokeless tobacco: Never Used  . Alcohol Use: 1.8 oz/week    3 Glasses of wine per week     Comment: occasionally  . Drug Use: No  . Sexual Activity: Not Currently   Other Topics Concern  . Not on file  Social History Narrative   Patient is single and lives at home alone.   Patient is retired.   Patient has some college .   Patient has 3 children.     Family History  Problem Relation Age of Onset  . Breast cancer Sister     x 2  . Colon cancer Neg Hx   . Diabetes Paternal Grandmother   . Uterine cancer Paternal Grandmother   . Lung cancer Paternal Grandmother   . Heart disease Mother   . Alcohol abuse Mother   . Heart disease Father   . Pancreatic cancer Cousin   . Colon polyps Sister   . Diabetes Maternal Uncle   . Heart disease Maternal Grandfather   . Heart disease Paternal Grandfather   . Stroke Paternal Grandfather     Review of Systems:  As stated in the HPI and otherwise negative.   BP 132/78  Pulse 76  Ht 5' 4"  (1.626 m)   Wt 162 lb (73.483 kg)  BMI 27.79 kg/m2  Physical Examination: General: Well developed, well nourished, NAD HEENT: OP clear, mucus membranes moist SKIN: warm, dry. No rashes. Neuro: No focal deficits Musculoskeletal: Muscle strength 5/5 all ext Psychiatric: Mood and affect normal Neck: No JVD, no carotid bruits, no thyromegaly, no lymphadenopathy. Lungs:Clear bilaterally, no wheezes, rhonci, crackles Cardiovascular: Regular rate and rhythm. No murmurs, gallops or rubs. Abdomen:Soft. Bowel sounds present. Non-tender.  Extremities: No lower extremity edema. Pulses are 2 + in the bilateral DP/PT.  Stress myoview 05/09/13: Stress Procedure: The patient received IV Lexiscan 0.4 mg over 15-seconds with concurrent low level exercise and then Technetium 44mSestamibi was injected at 30-seconds while the patient continued walking one more minute. This patient got sob and chest tightness with the Lexiscan injection. Quantitative spect images were obtained after a 45-minute delay.  Stress ECG: No significant ST segment change suggestive of ischemia.  QPS  Raw Data Images: Acquisition technically good; normal left ventricular size.  Stress Images: There is decreased uptake in the anterior wall, septum and apex.  Rest Images: There is decreased uptake in the anterior wall, septum and apex.  Subtraction (SDS): There is a fixed defect that is most consistent with a previous infarction.  Transient Ischemic Dilatation (Normal <1.22): n/a  Lung/Heart Ratio (Normal <0.45): 0.26  Quantitative Gated Spect Images  QGS EDV: 94 ml  QGS ESV: 39 ml  Impression  Exercise Capacity: Lexiscan with no exercise.  BP Response: Normal blood pressure response.  Clinical Symptoms: There is chest tightness and dyspnea.  ECG Impression: No significant ST segment change suggestive of ischemia.  Comparison with Prior Nuclear Study: No images to compare  Overall Impression: Intermediate risk stress nuclear study with large,  severe, fixed defect in the anterior wall, septum and apex consistent with prior infarct; no ischemia..  LV Ejection Fraction: 59%. LV Wall Motion: Akinesis of the anterior wall and apex; calculated EF appears to overestimate LV function; suggest echocardiogram to better assess.  Cardiac cath 11/25/13: Left mainstem: Normal  Left anterior descending (LAD): The LAD is widely patent at the site of the proximal stent. There is focal 40% disease in the mid vessel. The first diagonal has 30-40% disease.  Left circumflex (LCx): The LCx gives off 3 small OM branches. There is a stent in the proximal vessel. The vessel is widely patent.  Right coronary artery (RCA): The RCA has diffuse calcified plaque in the proximal to mid vessel up to 20-30%.  Left ventriculography: Left ventricular systolic function  is abnormal, there is distal anteroapical and inferoapical akinesis. LVEF is estimated at 40%, there is no significant mitral regurgitation  Final Conclusions:  1. Nonobstructive CAD  2. Moderate LV dysfunction.  EKG: NSR, 76 bpm. Septal infarct.   Assessment and Plan:   1. CAD: Stable. Known CAD. Recent cath February 2015 with stable disease.  Continue ASA, statin. Would restart Plavix. It has been held at [facility] since she is on Lovenox. She is on bisoprolol.   2. Ischemic Cardiomyopathy: Volume stable. ACE inhibitor was stopped due to hypotension. She is on beta blocker.    3.  Hypertension: Controlled. No changes.   4.  Hyperlipidemia: Continue statin.   5. COPD: She is followed by Dr. [name]. She is scheduled to them next week.   6. Breast CA: Plan per surgery and oncology. s/p lumpectomy. No chemo or XRT.   7. GERD: Will double Protonix to 40 mg po BID.

## 2013-12-23 NOTE — Patient Instructions (Addendum)
Your physician recommends that you schedule a follow-up appointment in:  3 months. --Scheduled for March 24, 2014 at 12:00  Your physician has recommended you make the following change in your medication:  Start Clopidogrel 75 mg by mouth daily.  Increase Protonix to 40 mg by mouth twice daily

## 2013-12-26 ENCOUNTER — Ambulatory Visit (INDEPENDENT_AMBULATORY_CARE_PROVIDER_SITE_OTHER): Payer: Medicare Other | Admitting: Pulmonary Disease

## 2013-12-26 ENCOUNTER — Encounter: Payer: Self-pay | Admitting: Pulmonary Disease

## 2013-12-26 VITALS — BP 114/64 | HR 77 | Temp 97.6°F | Ht 64.0 in | Wt 164.0 lb

## 2013-12-26 DIAGNOSIS — J449 Chronic obstructive pulmonary disease, unspecified: Secondary | ICD-10-CM

## 2013-12-26 DIAGNOSIS — R0902 Hypoxemia: Secondary | ICD-10-CM

## 2013-12-26 DIAGNOSIS — I2510 Atherosclerotic heart disease of native coronary artery without angina pectoris: Secondary | ICD-10-CM

## 2013-12-26 MED ORDER — FLUTICASONE-SALMETEROL 250-50 MCG/DOSE IN AEPB: 1.0000 | INHALATION_SPRAY | Freq: Two times a day (BID) | RESPIRATORY_TRACT | Status: DC

## 2013-12-26 MED ORDER — AZITHROMYCIN 250 MG PO TABS: ORAL_TABLET | ORAL | Status: DC

## 2013-12-26 NOTE — Patient Instructions (Signed)
Call [facility] to see if they can supply your oxygen with a portable oxygen concentrator (make sure you can carry this before buying it) Take the Zpack as written  In the nursing facility: -stop Breo -use duoneb as needed for shortness of breath -take pulmicort and brovana twice a day nebulized  At home: -Resume advair twice a day -resume Spiriva -use albuterol as needed for shortness of breath as you did prior to the hospitalization  For now, use 2L oxygen continuously  We will see you back in 2 months or sooner if needed

## 2013-12-26 NOTE — Assessment & Plan Note (Signed)
Continue 2 L continuously Hopefully this year we'll see some improvement with this

## 2013-12-26 NOTE — Progress Notes (Signed)
Subjective:    Patient ID: [name], female    DOB: Mar 10, 1941, 73 y.o.   MRN: [id number]  HPI  [name] patient with GOLD C COPD  11/08/2013 Sick visit >>[name] is here to see me because she is profoundly short of breath.  She has been having a lot of coughing, dyspnea, and mucus production despite the fact that she has been taking prednisone and azithromycin lately.  She denies fever or chills.  Her grandaughter has bronchitis with a fever.    12/26/2013 ROV >> [name] is here to see me today to follow up from her recent hospitalization for a severe flare of COPD.  She was initially admitted to [facility] ICU and required intubation and mechanical ventilation for several days.  [facility]  Since then, she has had physical therapy at a nursing facility for the last 3 weeks. She says that her strength has improved and her shortness of breath has improved. However in the last week she has noticed increased cough productive of mucus. She takes Breo daily but she says that it's not helping. She has been given duo neb 4 times a day.  Past Medical History  Diagnosis Date  . Other emphysema   . Unspecified arthropathy, shoulder region   . Gastric ulcer, unspecified as acute or chronic, without mention of hemorrhage, perforation, or obstruction   . Benign paroxysmal positional vertigo   . COPD (chronic obstructive pulmonary disease)   . Diverticula of colon   . Osteopenia   . Personal history of colonic polyps 2007    tubular adenoma high grade dysplasia  . Diverticulitis   . Hyperlipemia   . Fracture of ankle, bimalleolar, right, closed   . Hypoxemia   . C. difficile diarrhea   . Varicose vein   . Asthma   . GERD (gastroesophageal reflux disease)   . Hepatitis 1989  . Arthritis     "both shoulders"   . TIA (transient ischemic attack) 04/2008; 08/21/2012    "slight droop left lower lip after 08/21/12 TIA"  . On  home oxygen therapy   . Ischemic cardiomyopathy     a. EF 45-50% 2011. b. EF 35-40% by cath 09/01/12.  . Night terrors, adult     dr Leonie Man referred  for PSG in 2011, and again in 2013   . Breast cancer 04/08/13 bx    left  . Allergy   . CAD (coronary artery disease)     a. ant MI 60 tx with TPA/PTCA. b. Prior stenting procedures including RCA stent, DES to LCx 04/2011. c. [place] -> DES  to prox LAD 08/2012.   Marland Kitchen Hypertension     TAKES FOR H/O STROKE  . CVA (cerebral vascular accident)   . Stroke, acute, embolic     Nov 5364 , [place]  [facility] , was treated with TPA.   . 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 Path G1 IDC, 1.6 cm, neg margin, neg SLN       Review of Systems  Constitutional: Positive for fatigue. Negative for fever and chills.  HENT: Negative for rhinorrhea, sinus pressure and sneezing.   Respiratory: Positive for cough. Negative for shortness of breath and wheezing.   Cardiovascular: Negative for chest pain, palpitations and leg swelling.  Objective:   Physical Exam  Filed Vitals:   12/26/13 1423  BP: 114/64  Pulse: 77  Temp: 97.6 F (36.4 C)  TempSrc: Oral  Height: 5' 4"  (1.626 m)  Weight: 164 lb (74.39 kg)  SpO2: 96%   RA  Gen: Chronically ill-appearing, no acute distress HEENT: NCAT PULM: No rhonchi or wheezing today, good air movement CV: RRR, normal S1/S2 AB: BS+, soft, nontender Ext: warm, no edema       Assessment & Plan:   COPD Mrs. Guion had a very difficult time with her recent hospitalization requiring mechanical ventilation for an acute flare COPD do to an influenza infection. She also had MSSA pneumonia associated with this. Her convalescent period has gone fairly well and as expected for somebody who has severe COPD. She's not currently using oxygen continuously and her strength is slowly improving. At  this point, she needs to stay on oxygen 24 hours a day and continued to participate in physical therapy.  When she gets home and continues to get her strength up I think she should be a little participate in pulmonary rehabilitation.  She does appear to have a mild flare of COPD today.  Plan: -Continue physical therapy -Z-Pak -Stop Breo - Start perforomist continued budesonide -Make duo neb as needed rather than scheduled - Continue 2 L of oxygen continuously -Followup with me in 4 weeks will likely need pulmonary rehabilitation referral   HYPOXEMIA Continue 2 L continuously Hopefully this year we'll see some improvement with this   Updated Medication List Outpatient Encounter Prescriptions as of 12/26/2013  Medication Sig  . acetaminophen (TYLENOL) 325 MG tablet Take 2 tablets (650 mg total) by mouth every 4 (four) hours as needed for headache or mild pain.  Marland Kitchen albuterol (PROVENTIL) (2.5 MG/3ML) 0.083% nebulizer solution Take 3 mLs (2.5 mg total) by nebulization every 2 (two) hours as needed for wheezing.  Marland Kitchen anastrozole (ARIMIDEX) 1 MG tablet Take 1 mg by mouth daily.  Marland Kitchen aspirin EC 81 MG tablet Take 81 mg by mouth every evening.   Marland Kitchen atorvastatin (LIPITOR) 20 MG tablet Take 20 mg by mouth at bedtime.  . bisoprolol (ZEBETA) 10 MG tablet Take 20 mg by mouth daily.  . budesonide (PULMICORT) 0.5 MG/2ML nebulizer solution Take 0.5 mg by nebulization 2 (two) times daily.  . busPIRone (BUSPAR) 5 MG tablet Take 1 tablet (5 mg total) by mouth 2 (two) times daily.  . Cholecalciferol (VITAMIN D) 2000 UNITS tablet Take 2,000 Units by mouth 2 (two) times daily.  . clonazePAM (KLONOPIN) 0.5 MG tablet Take 1/4 tablet by mouth every morning for anxiety  . clopidogrel (PLAVIX) 75 MG tablet Take 1 tablet (75 mg total) by mouth daily.  . clotrimazole-betamethasone (LOTRISONE) cream Apply 1 application topically 3 (three) times daily.  Marland Kitchen CRANBERRY PO Take 1 tablet by mouth daily.  Marland Kitchen docusate sodium  (COLACE) 100 MG capsule Take 100 mg by mouth daily.  Marland Kitchen enoxaparin (LOVENOX) 40 MG/0.4ML injection Inject 40 mg into the skin daily.  . furosemide (LASIX) 20 MG tablet Take 1 tablet (20 mg total) by mouth daily.  Marland Kitchen gabapentin (NEURONTIN) 300 MG capsule Take 300 mg by mouth at bedtime.  Marland Kitchen ipratropium-albuterol (DUONEB) 0.5-2.5 (3) MG/3ML SOLN Take 3 mLs by nebulization every 6 (six) hours.  . lidocaine (LIDODERM) 5 % Place 1 patch onto the skin daily. Remove & Discard patch within 12 hours or as directed by MD  . loratadine (CLARITIN) 10 MG tablet Take 10 mg by mouth  daily.  . miconazole (MICOTIN) 2 % cream Apply 1 application topically at bedtime.  . Mouthwashes (BIOTENE DRY MOUTH MT) Use as directed 1 application in the mouth or throat 2 (two) times daily.  . Multiple Vitamins-Minerals (MULTIVITAMIN PO) Take 0.5 tablets by mouth 2 (two) times daily.  . nitroGLYCERIN (NITROSTAT) 0.4 MG SL tablet Place 0.4 mg under the tongue every 5 (five) minutes as needed for chest pain.  Marland Kitchen oxybutynin (DITROPAN) 5 MG tablet Take 2.5 mg by mouth at bedtime.  . pantoprazole (PROTONIX) 40 MG tablet Take 1 tablet (40 mg total) by mouth 2 (two) times daily.  . polyethylene glycol (MIRALAX / GLYCOLAX) packet Take 17 g by mouth daily.  . potassium chloride SA (K-DUR,KLOR-CON) 20 MEQ tablet Take 1 tablet (20 mEq total) by mouth once.  . predniSONE (DELTASONE) 10 MG tablet Take 30 mg by mouth daily with breakfast.  . sodium chloride (OCEAN) 0.65 % SOLN nasal spray Place 1 spray into both nostrils 3 (three) times daily.  Marland Kitchen tiotropium (SPIRIVA) 18 MCG inhalation capsule Place 18 mcg into inhaler and inhale daily.  . traMADol (ULTRAM) 50 MG tablet Take 1/2 tablet by mouth every 6 hours as needed for breakthrough pain  . [DISCONTINUED] Fluticasone Furoate-Vilanterol (BREO ELLIPTA IN) Inhale 1 puff into the lungs daily.  Marland Kitchen azithromycin (ZITHROMAX Z-PAK) 250 MG tablet Azithromycin 552m daily for one day, then 2586mdaily for  4 days  . Fluticasone-Salmeterol (ADVAIR DISKUS) 250-50 MCG/DOSE AEPB Inhale 1 puff into the lungs 2 (two) times daily.

## 2013-12-26 NOTE — Assessment & Plan Note (Signed)
[name] had a very difficult time with her recent hospitalization requiring mechanical ventilation for an acute flare COPD do to an influenza infection. She also had MSSA pneumonia associated with this. Her convalescent period has gone fairly well and as expected for somebody who has severe COPD. She's not currently using oxygen continuously and her strength is slowly improving. At this point, she needs to stay on oxygen 24 hours a day and continued to participate in physical therapy.  When she gets home and continues to get her strength up I think she should be a little participate in pulmonary rehabilitation.  She does appear to have a mild flare of COPD today.  Plan: -Continue physical therapy -Z-Pak -Stop Breo - Start perforomist continued budesonide -Make duo neb as needed rather than scheduled - Continue 2 L of oxygen continuously -Followup with me in 4 weeks will likely need pulmonary rehabilitation referral

## 2013-12-30 ENCOUNTER — Other Ambulatory Visit: Payer: Self-pay | Admitting: *Deleted

## 2013-12-30 MED ORDER — TRAMADOL HCL 50 MG PO TABS: ORAL_TABLET | ORAL | Status: DC

## 2013-12-30 NOTE — Telephone Encounter (Signed)
[facility] 

## 2014-01-04 ENCOUNTER — Non-Acute Institutional Stay (SKILLED_NURSING_FACILITY): Payer: Medicare Other | Admitting: Adult Health

## 2014-01-04 ENCOUNTER — Encounter: Payer: Self-pay | Admitting: Adult Health

## 2014-01-04 DIAGNOSIS — R531 Weakness: Secondary | ICD-10-CM

## 2014-01-04 DIAGNOSIS — J449 Chronic obstructive pulmonary disease, unspecified: Secondary | ICD-10-CM

## 2014-01-04 DIAGNOSIS — F411 Generalized anxiety disorder: Secondary | ICD-10-CM

## 2014-01-04 DIAGNOSIS — E46 Unspecified protein-calorie malnutrition: Secondary | ICD-10-CM

## 2014-01-04 DIAGNOSIS — Z8711 Personal history of peptic ulcer disease: Secondary | ICD-10-CM

## 2014-01-04 DIAGNOSIS — R5381 Other malaise: Secondary | ICD-10-CM

## 2014-01-04 DIAGNOSIS — C50919 Malignant neoplasm of unspecified site of unspecified female breast: Secondary | ICD-10-CM

## 2014-01-04 DIAGNOSIS — K5900 Constipation, unspecified: Secondary | ICD-10-CM | POA: Insufficient documentation

## 2014-01-04 DIAGNOSIS — E785 Hyperlipidemia, unspecified: Secondary | ICD-10-CM

## 2014-01-04 DIAGNOSIS — F419 Anxiety disorder, unspecified: Secondary | ICD-10-CM

## 2014-01-04 DIAGNOSIS — J4489 Other specified chronic obstructive pulmonary disease: Secondary | ICD-10-CM

## 2014-01-04 DIAGNOSIS — I10 Essential (primary) hypertension: Secondary | ICD-10-CM

## 2014-01-04 DIAGNOSIS — R5383 Other fatigue: Secondary | ICD-10-CM

## 2014-01-04 DIAGNOSIS — C50912 Malignant neoplasm of unspecified site of left female breast: Secondary | ICD-10-CM

## 2014-01-04 NOTE — Progress Notes (Signed)
Patient ID: [name], female   DOB: 03-Sep-1941, 73 y.o.   MRN: [id number]                 PROGRESS NOTE  DATE:  01/04/14  FACILITY: Nursing Home Location: [facility]  LEVEL OF CARE: SNF (31)  Routine Visit  CHIEF COMPLAINT:  Medical management of COPD, Hypertension,  Hyperlipidemia and Anxiety   HISTORY OF PRESENT ILLNESS:   REASSESSMENT OF ONGOING PROBLEM(S):  HTN: Pt 's HTN remains stable.  Denies CP, sob, DOE, pedal edema, headaches, dizziness or visual disturbances.  No complications from the medications currently being used.  Last BP : 127/62  HYPERLIPIDEMIA: No complications from the medications presently being used. 11/14 HDL 43   ANXIETY: The anxiety remains stable. Patient denies ongoing anxiety or irritability. No complications reported from the medications currently being used.  PAST MEDICAL HISTORY : Reviewed.  No changes.  CURRENT MEDICATIONS: Reviewed per [facility]  REVIEW OF SYSTEMS:  GENERAL: no change in appetite, no fatigue, no weight changes, no fever, chills or weakness RESPIRATORY: no cough, SOB, DOE, wheezing, hemoptysis CARDIAC: no chest pain, edema or palpitations GI: no abdominal pain, diarrhea, constipation, heart burn, nausea or vomiting  PHYSICAL EXAMINATION  GENERAL: no acute distress, normal body habitus NECK: supple, trachea midline, no neck masses, no thyroid tenderness, no thyromegaly LYMPHATICS: no LAN in the neck, no supraclavicular LAN RESPIRATORY: breathing is even & unlabored, BS CTAB CARDIAC: RRR, no murmur,no extra heart sounds, no edema GI: abdomen soft, normal BS, no masses, no tenderness, no hepatomegaly, no splenomegaly PSYCHIATRIC: the patient is alert & oriented to person, affect & behavior appropriate  LABS/RADIOLOGY: 12/14/13 sodium 137 potassium 3.4 glucose 98 BUN 19 creatinine 0.8 calcium 8.5 Labs reviewed: Basic Metabolic Panel:  Recent Labs  11/16/13 0445  11/17/13 0515 11/18/13 0400  11/19/13 0500  11/28/13 0500 12/03/13 0500 12/05/13 0500  NA 141  < > 144 142 145  < > 142 141 138  K 3.6*  < > 4.1 4.0 3.6*  < > 3.9 4.3 4.1  CL 97  < > 101 99 102  < > 100 98 97  CO2 37*  < > 34* 33* 34*  < > 31 34* 34*  GLUCOSE 113*  < > 92 106* 121*  < > 128* 90 94  BUN 32*  < > 31* 26* 24*  < > 22 26* 26*  CREATININE 0.65  < > 0.82 0.78 0.76  < > 0.87 0.75 0.80  CALCIUM 8.5  < > 8.6 9.0 8.9  < > 9.0 9.0 8.8  MG 2.2  < > 2.2 2.2 2.2  --   --   --   --   PHOS 4.0  --  4.2 5.0*  --   --   --   --   --   < > = values in this interval not displayed. Liver Function Tests:  Recent Labs  11/19/13 0500 11/21/13 0500 11/28/13 0500  AST 17 16 16   ALT 32 28 22  ALKPHOS 89 95 94  BILITOT 0.3 0.4 0.3  PROT 6.1 6.5 6.6  ALBUMIN 2.5* 2.7* 3.0*   CBC:  Recent Labs  11/21/13 0500  11/23/13 0600 11/28/13 0500 12/03/13 0500 12/05/13 0500  WBC 14.2*  < > 13.7* 11.1* 10.5 9.9  NEUTROABS 10.1*  --  9.8* 7.5  --   --   HGB 12.8  < > 12.3 12.0 10.7* 10.7*  HCT 39.4  < >  38.0 36.4 33.6* 33.3*  MCV 88.9  < > 88.4 89.7 89.4 88.8  PLT 219  < > 252 195 167 151  < > = values in this interval not displayed.   Lipid Panel:  Recent Labs  08/31/13 1213  HDL 43.00   Cardiac Enzymes:  Recent Labs  11/15/13 0545  11/16/13 1746 11/16/13 2220 11/17/13 0515  CKTOTAL 47  --   --   --   --   CKMB 2.4  --   --   --   --   TROPONINI  --   < > <0.30 <0.30 <0.30  < > = values in this interval not displayed.   CBG:  Recent Labs  11/16/13 1954 11/16/13 2308 11/17/13 0346  GLUCAP 104* 120* 79     ASSESSMENT/PLAN:  COPD - stable Generalized weakness - continue PT and OT Protein calorie malnutrition - continue supplementation Hypertension - well-controlled Hyperlipidemia - continue Lipitor Constipation - no complaints Breast cancer - continue Arimidex Anxiety - stable Peptic Ulcer, history - continue Protonix   CPT CODE: 12751  Zehra Rucci Vargas - NP [facility] [phone]

## 2014-01-06 ENCOUNTER — Non-Acute Institutional Stay (SKILLED_NURSING_FACILITY): Payer: Medicare Other | Admitting: Adult Health

## 2014-01-06 DIAGNOSIS — R5383 Other fatigue: Secondary | ICD-10-CM

## 2014-01-06 DIAGNOSIS — C50919 Malignant neoplasm of unspecified site of unspecified female breast: Secondary | ICD-10-CM

## 2014-01-06 DIAGNOSIS — R531 Weakness: Secondary | ICD-10-CM

## 2014-01-06 DIAGNOSIS — I10 Essential (primary) hypertension: Secondary | ICD-10-CM

## 2014-01-06 DIAGNOSIS — F411 Generalized anxiety disorder: Secondary | ICD-10-CM

## 2014-01-06 DIAGNOSIS — Z8711 Personal history of peptic ulcer disease: Secondary | ICD-10-CM

## 2014-01-06 DIAGNOSIS — C50912 Malignant neoplasm of unspecified site of left female breast: Secondary | ICD-10-CM

## 2014-01-06 DIAGNOSIS — J449 Chronic obstructive pulmonary disease, unspecified: Secondary | ICD-10-CM

## 2014-01-06 DIAGNOSIS — E785 Hyperlipidemia, unspecified: Secondary | ICD-10-CM

## 2014-01-06 DIAGNOSIS — K5900 Constipation, unspecified: Secondary | ICD-10-CM

## 2014-01-06 DIAGNOSIS — R5381 Other malaise: Secondary | ICD-10-CM

## 2014-01-06 DIAGNOSIS — F419 Anxiety disorder, unspecified: Secondary | ICD-10-CM

## 2014-01-06 NOTE — Progress Notes (Signed)
Patient ID: [name], female   DOB: Oct 24, 1940, 73 y.o.   MRN: [id number]                PROGRESS NOTE  DATE:  01/06/14  FACILITY: Nursing Home Location: [facility]  LEVEL OF CARE: SNF (31)  Acute Visit  CHIEF COMPLAINT:  Discharge Notes   HISTORY OF PRESENT ILLNESS: This is a 73 year old female who is for discharge home with  Home health PT, OT and Nursing. DME: Oxygen for home and travel; Oxygen @ 2L/min via N continuously - needs oxygen concentrator and portable oxygen. She has been admitted to [facility] on 12/08/13 from [facility] with principal diagnoses of Acute on chronic respiratory failure, chronic obstructive pulmonary disease, S/P exacerbation, Generalized weakness and protein-calorie malnutrition. Patient was admitted to this facility for short-term rehabilitation after the patient's recent hospitalization.  Patient has completed SNF rehabilitation and therapy has cleared the patient for discharge.  REASSESSMENT OF ONGOING PROBLEM(S):  HTN: Pt 's HTN remains stable.  Denies CP, sob, DOE, pedal edema, headaches, dizziness or visual disturbances.  No complications from the medications currently being used.  Last BP : 112/60  HYPERLIPIDEMIA: No complications from the medications presently being used. 11/14 HDL 43   COPD: the COPD remains stable.  Pt denies sob, cough, wheezing or declining exercise tolerance.  No complications from the medications presently being used.  CONSTIPATION: The constipation remains stable. No complications from the medications presently being used. Patient denies ongoing constipation, abdominal pain, nausea or vomiting.  PAST MEDICAL HISTORY : Reviewed.  No changes.  CURRENT MEDICATIONS: Reviewed per [facility]  REVIEW OF SYSTEMS:  GENERAL: no change in appetite, no fatigue, no weight changes, no fever, chills or weakness RESPIRATORY: no cough, SOB, DOE, wheezing, hemoptysis CARDIAC: no chest pain, edema or  palpitations GI: no abdominal pain, diarrhea, constipation, heart burn, nausea or vomiting  PHYSICAL EXAMINATION  GENERAL: no acute distress, normal body habitus NECK: supple, trachea midline, no neck masses, no thyroid tenderness, no thyromegaly RESPIRATORY: breathing is even & unlabored, BS CTAB CARDIAC: RRR, no murmur,no extra heart sounds, no edema GI: abdomen soft, normal BS, no masses, no tenderness, no hepatomegaly, no splenomegaly PSYCHIATRIC: the patient is alert & oriented to person, affect & behavior appropriate  LABS/RADIOLOGY: 12/14/13 sodium 137 potassium 3.4 glucose 98 BUN 19 creatinine 0.8 calcium 8.5 Labs reviewed: Basic Metabolic Panel:  Recent Labs  11/16/13 0445  11/17/13 0515 11/18/13 0400 11/19/13 0500  11/28/13 0500 12/03/13 0500 12/05/13 0500  NA 141  < > 144 142 145  < > 142 141 138  K 3.6*  < > 4.1 4.0 3.6*  < > 3.9 4.3 4.1  CL 97  < > 101 99 102  < > 100 98 97  CO2 37*  < > 34* 33* 34*  < > 31 34* 34*  GLUCOSE 113*  < > 92 106* 121*  < > 128* 90 94  BUN 32*  < > 31* 26* 24*  < > 22 26* 26*  CREATININE 0.65  < > 0.82 0.78 0.76  < > 0.87 0.75 0.80  CALCIUM 8.5  < > 8.6 9.0 8.9  < > 9.0 9.0 8.8  MG 2.2  < > 2.2 2.2 2.2  --   --   --   --   PHOS 4.0  --  4.2 5.0*  --   --   --   --   --   < > =  values in this interval not displayed. Liver Function Tests:  Recent Labs  11/19/13 0500 11/21/13 0500 11/28/13 0500  AST 17 16 16   ALT 32 28 22  ALKPHOS 89 95 94  BILITOT 0.3 0.4 0.3  PROT 6.1 6.5 6.6  ALBUMIN 2.5* 2.7* 3.0*   CBC:  Recent Labs  11/21/13 0500  11/23/13 0600 11/28/13 0500 12/03/13 0500 12/05/13 0500  WBC 14.2*  < > 13.7* 11.1* 10.5 9.9  NEUTROABS 10.1*  --  9.8* 7.5  --   --   HGB 12.8  < > 12.3 12.0 10.7* 10.7*  HCT 39.4  < > 38.0 36.4 33.6* 33.3*  MCV 88.9  < > 88.4 89.7 89.4 88.8  PLT 219  < > 252 195 167 151  < > = values in this interval not displayed.   Lipid Panel:  Recent Labs  08/31/13 1213  HDL 43.00    Cardiac Enzymes:  Recent Labs  11/15/13 0545  11/16/13 1746 11/16/13 2220 11/17/13 0515  CKTOTAL 47  --   --   --   --   CKMB 2.4  --   --   --   --   TROPONINI  --   < > <0.30 <0.30 <0.30  < > = values in this interval not displayed.   CBG:  Recent Labs  11/16/13 1954 11/16/13 2308 11/17/13 0346  GLUCAP 104* 120* 79     ASSESSMENT/PLAN:  COPD - stable Generalized weakness - continue Home health PT, OT and Nursing Hypertension - well-controlled; continue Bisoprolol Hyperlipidemia - continue Lipitor Constipation - no complaints Breast cancer - continue Arimidex Anxiety - stable; continue Klonopin Peptic Ulcer, history - continue Protonix   I have filled out patient's discharge paperwork and written prescriptions.  Patient will receive home health PT, OT and Nursing   DME provided: Oxygen for home and travel; Oxygen @ 2L/min via [place] continuously - needs oxygen concentrator and portable oxygen.   Total discharge time: Greater than 30 minutes Discharge time involved coordination of the discharge process with social worker, nursing staff and therapy department. Medical justification for home health services/DME verified.  CPT CODE: 47425  Seth Bake - NP [facility] [phone]

## 2014-01-09 ENCOUNTER — Telehealth: Payer: Self-pay | Admitting: Pulmonary Disease

## 2014-01-09 DIAGNOSIS — J449 Chronic obstructive pulmonary disease, unspecified: Secondary | ICD-10-CM

## 2014-01-09 NOTE — Telephone Encounter (Signed)
lmomtcb x1 for pt 

## 2014-01-10 ENCOUNTER — Telehealth: Payer: Self-pay | Admitting: Pulmonary Disease

## 2014-01-10 NOTE — Telephone Encounter (Signed)
I think it would be a bad idea to send her home if she has pneumonia that is untreated.  However, without seeing her there is only so much I can do

## 2014-01-10 NOTE — Telephone Encounter (Signed)
Pt is very frustrated that she has not heard anything. Wants to talk to a nurse as soon as lunch is over.

## 2014-01-10 NOTE — Telephone Encounter (Signed)
Called spoke with patient who reported that she had a cxr done last evening 3.23.15 at [facility] that showed the "beginning stages of pneumonia" and was started on Avelox 400mg  QD x7 days and Florastor 250mg  BID x10days.  Pt stated that she is supposed to go home from the SNF next weekend and she "does not want to go home with pneumonia."  Pt is requesting BQ's thoughts on this treatment plan, stating that if BQ would like her to come in for appt then she would like to go ahead and get this scheduled.  BQ with openings on 3.26.15 in the [place] office and offered to schedule appt for pt, but she declined at this time wanting BQ's recs.  Dr Lake Bells please advise, thank you.

## 2014-01-11 NOTE — Telephone Encounter (Signed)
Called, spoke with pt.  Explained below to her per BQ. She verbalized understanding of this.  I offered pt to schedule appt with BQ tomorrow.  Pt declined stating she "feels fine" right now but still has "the cough that I had when I was in [facility]."  Cough is nonprod.  Pt states she is going to ask rehab to take a cxr Tuesday as she is scheduled to leave on Wednesday.  Per pt, she will call back to schedule OV if cxr on Tuesday still shows pna or is she isn't feeling better or symptoms have worsened.  Pt states she can't come in to see BQ while in rehab d/t problem with insurance coverage.  She voiced no further questions or concerns at this time and was thankful for [place] sending msg to BQ.

## 2014-01-12 NOTE — Telephone Encounter (Signed)
Pt has called back. °

## 2014-01-12 NOTE — Telephone Encounter (Signed)
Spoke with the pt  She states that she does not remember calling us on 01/09/14  She states that she does want [place] to send order to [facility] for POC with pulsed o2  She states that the doc at the nursing home dx'ed her with PNA on 01/09/14 and she is being txed with 2 different abx  She wants to know when BQ wants to see her for f/u on this  Please advise on POC order and when to f/u thanks!

## 2014-01-12 NOTE — Telephone Encounter (Signed)
[id number]. Jennifer Castillo, CMA  

## 2014-01-12 NOTE — Telephone Encounter (Signed)
Returning call.

## 2014-01-12 NOTE — Telephone Encounter (Signed)
Order was sent to [facility]  Pt aware  Appt with RA f/u PNA 01/23/14- this is the soonest she would come in

## 2014-01-12 NOTE — Telephone Encounter (Signed)
She should see [name] within 7 days, if I don't have any openings in that timeframe OK to order POC

## 2014-01-12 NOTE — Telephone Encounter (Signed)
LMTCB

## 2014-01-17 ENCOUNTER — Telehealth: Payer: Self-pay | Admitting: Pulmonary Disease

## 2014-01-17 DIAGNOSIS — J449 Chronic obstructive pulmonary disease, unspecified: Secondary | ICD-10-CM

## 2014-01-17 NOTE — Telephone Encounter (Signed)
Sounds good to me, please order

## 2014-01-17 NOTE — Telephone Encounter (Signed)
Called spoke with Apolonio Schneiders from [employer]. Requesting home health orders and for COPD pragram kit so nurse can go out teach her things to help her manage COPD. Also for PT/OT. Please advise Dr. Lake Bells thanks

## 2014-01-17 NOTE — Telephone Encounter (Signed)
ATC rachel. They are now closed. Will call back in AM

## 2014-01-17 NOTE — Telephone Encounter (Signed)
Spoke w/ Gas[facility]. He reports received the order to eval for POC. Now they need order for conserving device for portable O2. Order placed. Nothing further needed

## 2014-01-18 ENCOUNTER — Encounter: Payer: Self-pay | Admitting: Internal Medicine

## 2014-01-18 ENCOUNTER — Telehealth: Payer: Self-pay | Admitting: Pulmonary Disease

## 2014-01-18 ENCOUNTER — Non-Acute Institutional Stay (SKILLED_NURSING_FACILITY): Payer: Medicare Other | Admitting: Internal Medicine

## 2014-01-18 DIAGNOSIS — I4729 Other ventricular tachycardia: Secondary | ICD-10-CM

## 2014-01-18 DIAGNOSIS — J15211 Pneumonia due to Methicillin susceptible Staphylococcus aureus: Secondary | ICD-10-CM

## 2014-01-18 DIAGNOSIS — F419 Anxiety disorder, unspecified: Secondary | ICD-10-CM

## 2014-01-18 DIAGNOSIS — J9600 Acute respiratory failure, unspecified whether with hypoxia or hypercapnia: Secondary | ICD-10-CM

## 2014-01-18 DIAGNOSIS — E785 Hyperlipidemia, unspecified: Secondary | ICD-10-CM

## 2014-01-18 DIAGNOSIS — C50919 Malignant neoplasm of unspecified site of unspecified female breast: Secondary | ICD-10-CM

## 2014-01-18 DIAGNOSIS — C50912 Malignant neoplasm of unspecified site of left female breast: Secondary | ICD-10-CM

## 2014-01-18 DIAGNOSIS — I6350 Cerebral infarction due to unspecified occlusion or stenosis of unspecified cerebral artery: Secondary | ICD-10-CM

## 2014-01-18 DIAGNOSIS — R0902 Hypoxemia: Secondary | ICD-10-CM

## 2014-01-18 DIAGNOSIS — I10 Essential (primary) hypertension: Secondary | ICD-10-CM

## 2014-01-18 DIAGNOSIS — I472 Ventricular tachycardia: Secondary | ICD-10-CM

## 2014-01-18 DIAGNOSIS — J441 Chronic obstructive pulmonary disease with (acute) exacerbation: Secondary | ICD-10-CM

## 2014-01-18 DIAGNOSIS — I2510 Atherosclerotic heart disease of native coronary artery without angina pectoris: Secondary | ICD-10-CM

## 2014-01-18 DIAGNOSIS — F411 Generalized anxiety disorder: Secondary | ICD-10-CM

## 2014-01-18 NOTE — Telephone Encounter (Signed)
Pt has called back. °

## 2014-01-18 NOTE — Telephone Encounter (Signed)
[email or web address].Jennifer Castillo, CMA  

## 2014-01-18 NOTE — Telephone Encounter (Signed)
Called and gave rachel VO. Nothing further needed

## 2014-01-18 NOTE — Progress Notes (Signed)
MRN: [id number] Name: [name]  Sex: female Age: 73 y.o. DOB: [birth year]  [place] #: [address] Facility/Room: 105 Level Of Care: SNF Provider: Inocencio Homes D Emergency Contacts: Extended Emergency Contact Information Primary Emergency Contact: [facility] Address: [address]          [place], [place] [address] [place] Phone: [phone] Mobile Phone: [phone] Relation: Daughter Secondary Emergency Contact: Phineas Real Address: [place], [place] Phone: [phone] Relation: Daughter  Code Status:FULL   Allergies: Escitalopram oxalate; Indomethacin; Nsaids; Codeine; and Sulfonamide derivatives  Chief Complaint  Patient presents with  . Discharge Note    HPI: Patient is 73 y.o. female who was admitted to SNF after respiratory failure and who is now ready to be discharged to home.  Past Medical History  Diagnosis Date  . Other emphysema   . Unspecified arthropathy, shoulder region   . Gastric ulcer, unspecified as acute or chronic, without mention of hemorrhage, perforation, or obstruction   . Benign paroxysmal positional vertigo   . COPD (chronic obstructive pulmonary disease)   . Diverticula of colon   . Osteopenia   . Personal history of colonic polyps 2007    tubular adenoma high grade dysplasia  . Diverticulitis   . Hyperlipemia   . Fracture of ankle, bimalleolar, right, closed   . Hypoxemia   . C. difficile diarrhea   . Varicose vein   . Asthma   . GERD (gastroesophageal reflux disease)   . Hepatitis 1989  . Arthritis     "both shoulders"   . TIA (transient ischemic attack) 04/2008; 08/21/2012    "slight droop left lower lip after 08/21/12 TIA"  . On home oxygen therapy   . Ischemic cardiomyopathy     a. EF 45-50% 2011. b. EF 35-40% by cath 09/01/12.  . Night terrors, adult     dr Leonie Man referred  for PSG in 2011, and again in 2013   . Breast cancer  04/08/13 bx    left  . Allergy   . CAD (coronary artery disease)     a. ant MI 29 tx with TPA/PTCA. b. Prior stenting procedures including RCA stent, DES to LCx 04/2011. c. [place] -> DES  to prox LAD 08/2012.   Marland Kitchen Hypertension     TAKES FOR H/O STROKE  . CVA (cerebral vascular accident)   . Stroke, acute, embolic     Nov 0947 , [facility] , was treated with TPA.   . 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 Path G1 IDC, 1.6 cm, neg margin, neg SLN     Past Surgical History  Procedure Laterality Date  . Total shoulder arthroplasty  2012    left  . Orif ankle fracture  2007    right  . Martial megeti  0962'E    "bladder operation; not successful " (08/31/2012)  . Varicose vein surgery  1983; 1985    "both legs both times" (08/31/2012)  . Total shoulder replacement  12/05/2010    Dr Onnie Graham  . Abdominal hysterectomy      TAH w/BSO  . Augmentation mammaplasty  1975  . Coronary angioplasty with stent placement  2010  . Coronary angioplasty  1990    "3 times" (08/31/2012)  . Colon surgery  07/07/2011  Dr. Hassell Done  . Appendectomy  1960  . Nose surgery      1977  . Breast augmenttation    . Catarct  right      RIGHT EYE  . Breast lumpectomy with needle localization and axillary sentinel lymph node bx Left 06/02/2013    Procedure: BREAST LUMPECTOMY WITH NEEDLE LOCALIZATION AND AXILLARY SENTINEL LYMPH NODE BX;  Surgeon: Haywood Lasso, MD;  Location: [place];  Service: General;  Laterality: Left;  NEEDLE LOC BCG 7:30 NUC MED 9:30 RECONSTRUCTION TO FOLLOW 1 HOUR added   . Breast implant removal Left 06/02/2013    Procedure: REMOVAL BREAST IMPLANTS; LEFT BREAST CAPSULECTOMY WITH PLACEMENT OF TISSUE EXPANDER AND USE OF FLEX HD;  Surgeon: Crissie Reese, MD;  Location: [place];  Service: Plastics;  Laterality: Left;      Medication List       This list is  accurate as of: 01/18/14 12:15 PM.  Always use your most recent med list.               anastrozole 1 MG tablet  Commonly known as:  ARIMIDEX  Take 1 mg by mouth daily.     atorvastatin 20 MG tablet  Commonly known as:  LIPITOR  Take 20 mg by mouth at bedtime.     bisoprolol 10 MG tablet  Commonly known as:  ZEBETA  Take 20 mg by mouth daily.     budesonide 0.5 MG/2ML nebulizer solution  Commonly known as:  PULMICORT  Take 0.5 mg by nebulization 2 (two) times daily.     busPIRone 5 MG tablet  Commonly known as:  BUSPAR  Take 1 tablet (5 mg total) by mouth 2 (two) times daily.     clonazePAM 0.5 MG tablet  Commonly known as:  KLONOPIN  2 (two) times daily. Take 1/4 tablet by mouth every morning for anxiety     clopidogrel 75 MG tablet  Commonly known as:  PLAVIX  Take 1 tablet (75 mg total) by mouth daily.     furosemide 20 MG tablet  Commonly known as:  LASIX  Take 1 tablet (20 mg total) by mouth daily.     gabapentin 300 MG capsule  Commonly known as:  NEURONTIN  Take 300 mg by mouth at bedtime.     lidocaine 5 %  Commonly known as:  LIDODERM  Place 1 patch onto the skin daily. Remove & Discard patch within 12 hours or as directed by MD     oxybutynin 5 MG tablet  Commonly known as:  DITROPAN  Take 2.5 mg by mouth at bedtime.     pantoprazole 40 MG tablet  Commonly known as:  PROTONIX  Take 1 tablet (40 mg total) by mouth 2 (two) times daily.     PERFOROMIST 20 MCG/2ML nebulizer solution  Generic drug:  formoterol  Take 20 mcg by nebulization 2 (two) times daily.     potassium chloride SA 20 MEQ tablet  Commonly known as:  K-DUR,KLOR-CON  Take 1 tablet (20 mEq total) by mouth once.     predniSONE 10 MG tablet  Commonly known as:  DELTASONE  Take 5 mg by mouth daily with breakfast.     traMADol 50 MG tablet  Commonly known as:  ULTRAM  Take one tablet by mouth every 6 hours as needed     zolpidem 5 MG tablet  Commonly known as:  AMBIEN  Take 5 mg  by mouth at bedtime as needed for sleep.  Give 1/2 tab = 2.5 mg PO Q HS        Meds ordered this encounter  Medications  . clonazePAM (KLONOPIN) 0.5 MG tablet    Sig: 2 (two) times daily. Take 1/4 tablet by mouth every morning for anxiety    Immunization History  Administered Date(s) Administered  . Influenza Split 06/21/2011, 07/20/2013  . Influenza Whole 09/19/2008, 07/25/2009, 08/16/2010  . Influenza-Unspecified 08/14/2012  . Pneumococcal Conjugate-13 10/03/2013  . Pneumococcal Polysaccharide-23 08/30/2010    History  Substance Use Topics  . Smoking status: Former Smoker -- 1.00 packs/day for 40 years    Types: Cigarettes    Quit date: 10/20/1997  . Smokeless tobacco: Never Used  . Alcohol Use: 1.8 oz/week    3 Glasses of wine per week     Comment: occasionally    Filed Vitals:   01/18/14 1201  BP: 124/62  Pulse: 74  Temp: 98.6 F (37 C)  Resp: 20    Physical Exam  GENERAL APPEARANCE: Alert, conversant. Appropriately groomed. No acute distress. Chronic )2 HEENT: Unremarkable. RESPIRATORY: Breathing is even, unlabored. Lung sounds are clear   CARDIOVASCULAR: Heart RRR no murmurs, rubs or gallops. No peripheral edema.  GASTROINTESTINAL: Abdomen is soft, non-tender, not distended w/ normal bowel sounds.  NEUROLOGIC: Cranial nerves 2-12 grossly intact. Moves all extremities no tremor.  Patient Active Problem List   Diagnosis Date Noted  . Constipation 01/04/2014  . Vaginitis 12/13/2013  . Anxiety 12/13/2013  . Chest pain 11/25/2013  . Non-sustained ventricular tachycardia 11/16/2013  . MSSA (methicillin susceptible Staphylococcus aureus) pneumonia 11/13/2013  . Acute respiratory failure 11/09/2013  . COPD exacerbation 11/08/2013  . Sialadenitis 11/08/2013  . COPD (chronic obstructive pulmonary disease) 06/02/2013  . Breast cancer   . Breast cancer, left, LIQ, Stage 1, receptor+, her2 neg 04/14/2013  . Cancer of lower-inner quadrant of left female breast  04/12/2013  . Night terrors, adult   . Stroke, acute, embolic   . Unstable angina 09/02/2012  . Abdominal pain 03/26/2012  . Colon cancer 06/27/2011  . Colitis 06/13/2011  . Atypical back pain 06/13/2011  . Hypertension 03/27/2011  . Hyperlipemia 03/27/2011  . Iron deficiency anemia, unspecified 03/27/2011  . Constipation, acute 03/27/2011  . History of peptic ulcer disease 03/27/2011  . HYPERTENSION, BENIGN 12/02/2010  . CARDIOMYOPATHY, ISCHEMIC 09/24/2010  . BENIGN POSITIONAL VERTIGO 05/24/2010  . Acute bronchitis 03/25/2010  . DIVERTICULITIS-COLON 11/15/2009  . HYPOXEMIA 07/25/2009  . CVA 07/24/2009  . HYPERLIPIDEMIA-MIXED 05/07/2009  . DIARRHEA-PRESUMED INFECTIOUS 11/01/2008  . ACUT GASTR ULCER W/PERF W/O MENTION OBSTRUCTION 09/15/2008  . Diverticulosis of Colon (without Mention of Hemorrhage) 09/15/2008  . Personal History of Colonic Polyps 09/15/2008  . CANDIDIASIS 09/01/2008  . GASTRIC ULCER 09/01/2008  . ARTHRITIS, LEFT SHOULDER 09/01/2008  . CEREBROVASCULAR ACCIDENT, HX OF 09/01/2008  . DIVERTICULITIS, HX OF 09/01/2008  . EXTERNAL OTITIS 12/03/2007  . COPD 10/11/2007  . CELLULITIS/ABSCESS NOS 06/11/2007  . MYOCARDIAL INFARCTION, HX OF 11/13/2006  . CORONARY ARTERY DISEASE, multiple PCIs in the past, cath 11/25/13 with non obstructive disease continue medical therapy 11/13/2006  . WOUND INFECTION 11/13/2006  . OSTEOPENIA 11/13/2006  . FRACTURE, ANKLE, RIGHT 11/13/2006    CBC    Component Value Date/Time   WBC 9.9 12/05/2013 0500   WBC 8.4 04/20/2013 1221   RBC 3.75* 12/05/2013 0500   RBC 4.41 04/20/2013 1221   RBC 3.83* 08/24/2008 0455   HGB 10.7* 12/05/2013 0500   HGB 13.5 04/20/2013 1221   HCT 33.3* 12/05/2013 0500  HCT 39.6 04/20/2013 1221   PLT 151 12/05/2013 0500   PLT 182 04/20/2013 1221   MCV 88.8 12/05/2013 0500   MCV 89.8 04/20/2013 1221   LYMPHSABS 2.8 11/28/2013 0500   LYMPHSABS 1.4 04/20/2013 1221   MONOABS 0.6 11/28/2013 0500   MONOABS 0.7 04/20/2013 1221   EOSABS  0.2 11/28/2013 0500   EOSABS 0.2 04/20/2013 1221   BASOSABS 0.0 11/28/2013 0500   BASOSABS 0.1 04/20/2013 1221    CMP     Component Value Date/Time   NA 138 12/05/2013 0500   NA 143 04/20/2013 1221   K 4.1 12/05/2013 0500   K 3.7 04/20/2013 1221   CL 97 12/05/2013 0500   CO2 34* 12/05/2013 0500   CO2 27 04/20/2013 1221   GLUCOSE 94 12/05/2013 0500   GLUCOSE 108 04/20/2013 1221   GLUCOSE 101* 08/04/2006 1026   BUN 26* 12/05/2013 0500   BUN 25.6 04/20/2013 1221   CREATININE 0.80 12/05/2013 0500   CREATININE 0.9 04/20/2013 1221   CREATININE 0.85 07/23/2011 1635   CALCIUM 8.8 12/05/2013 0500   CALCIUM 9.3 04/20/2013 1221   PROT 6.6 11/28/2013 0500   PROT 6.7 04/20/2013 1221   ALBUMIN 3.0* 11/28/2013 0500   ALBUMIN 3.7 04/20/2013 1221   AST 16 11/28/2013 0500   AST 23 04/20/2013 1221   ALT 22 11/28/2013 0500   ALT 20 04/20/2013 1221   ALKPHOS 94 11/28/2013 0500   ALKPHOS 111 04/20/2013 1221   BILITOT 0.3 11/28/2013 0500   BILITOT 0.54 04/20/2013 1221   GFRNONAA 72* 12/05/2013 0500   GFRAA 83* 12/05/2013 0500    Assessment and Plan  Patient is stable and improved and ready to be discharged to home with OT/PT/nursing/ chronic O2. She will be following up with her pulmonologist in 5 days.  Hennie Duos, MD

## 2014-01-18 NOTE — Telephone Encounter (Signed)
lmtcb x1 

## 2014-01-19 NOTE — Telephone Encounter (Signed)
Spoke with the pt and advised her to come in for her visit and if Dr. [name] wants an xray at that time we can order it. [place], CMA

## 2014-01-20 ENCOUNTER — Other Ambulatory Visit: Payer: Self-pay

## 2014-01-20 MED ORDER — CLOPIDOGREL BISULFATE 75 MG PO TABS: 75.0000 mg | ORAL_TABLET | Freq: Every day | ORAL | Status: DC

## 2014-01-23 ENCOUNTER — Ambulatory Visit (INDEPENDENT_AMBULATORY_CARE_PROVIDER_SITE_OTHER)
Admission: RE | Admit: 2014-01-23 | Discharge: 2014-01-23 | Disposition: A | Discharge status: Home / Self Care | Payer: Medicare Other | Source: Ambulatory Visit | Attending: Pulmonary Disease | Admitting: Pulmonary Disease

## 2014-01-23 ENCOUNTER — Ambulatory Visit: Payer: Medicare Other | Admitting: Pulmonary Disease

## 2014-01-23 ENCOUNTER — Encounter: Payer: Self-pay | Admitting: Pulmonary Disease

## 2014-01-23 VITALS — BP 124/72 | HR 102 | Temp 97.6°F | Ht 64.5 in | Wt 167.0 lb

## 2014-01-23 DIAGNOSIS — J449 Chronic obstructive pulmonary disease, unspecified: Secondary | ICD-10-CM

## 2014-01-23 DIAGNOSIS — B379 Candidiasis, unspecified: Secondary | ICD-10-CM

## 2014-01-23 DIAGNOSIS — J441 Chronic obstructive pulmonary disease with (acute) exacerbation: Secondary | ICD-10-CM

## 2014-01-23 MED ORDER — NYSTATIN 100000 UNIT/ML MT SUSP: 5.0000 mL | Freq: Two times a day (BID) | OROMUCOSAL | Status: DC

## 2014-01-23 NOTE — Progress Notes (Signed)
Subjective:    Patient ID: [name], female    DOB: [birth year], 73 y.o.   MRN: [id number]  HPI  PCP - Linna Darner  72/F, ex smoker, realtor with Gold C COPD , on nocturnal O2 since 10/10 for FU.  12/2009 FEV1 1.0 L (48%) She smoked for 40 years and quit in 1999. Advair worked better than symbicort.  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  Cardiac cath 08/31/12 with severe stenosis proximal LAD now s/p drug eluting stent proximal LAD  She went to [facility] for a second opinion - rehab & Theophylline advised    09/2013 Underwent surgery Left breast for breast cancer  Developed post op infectious complications     01/23/6255  Chief Complaint  Patient presents with  . Follow-up    C/o increase in SOB and dry cough. Pt states she had pna at [facility] and states shefeels it never resolved. Denies CP.    hospitalization for a severe flare of COPD 10/2013. She was initially admitted to [facility] ICU and required intubation and mechanical ventilation for several days (Flu + MSSA pneumonia) . [facility] three weeks. Then to  a nursing facility for the last 3 weeks. Got out of [facility] on April 1 st. She says that her strength has improved and her shortness of breath has improved. However in the last week she has noticed increased cough productive of mucus. She takes Breo daily but she says that it's not helping. She was given duo neb 4 times a day. >>> was on pulmicort/ performist  X 2 months - has supply of advair at home   Stopped lasix , c/o pedal edema Now on 24h o2 Getting HHPT  She has questions  about timing of right shoulder surgery ('bone on bone') & breast surgery   Past Medical History  Diagnosis Date  . Other emphysema   . Unspecified arthropathy, shoulder region   . Gastric ulcer, unspecified as acute or chronic, without mention of hemorrhage, perforation, or obstruction   . Benign paroxysmal positional vertigo   . COPD (chronic obstructive pulmonary disease)   . Diverticula of colon   . Osteopenia   . Personal history of colonic polyps 2007    tubular adenoma high grade dysplasia  . Diverticulitis   . Hyperlipemia   . Fracture of ankle, bimalleolar, right, closed   . Hypoxemia   . C. difficile diarrhea   . Varicose vein   . Asthma   . GERD (gastroesophageal reflux disease)   . Hepatitis 1989  . Arthritis     "both shoulders"   . TIA (transient ischemic attack) 04/2008; 08/21/2012    "slight droop left lower lip after 08/21/12 TIA"  . On home oxygen therapy   . Ischemic cardiomyopathy     a. EF 45-50% 2011. b. EF 35-40% by cath 09/01/12.  . Night terrors, adult     dr Leonie Man referred  for PSG in 2011, and again in 2013   . Breast cancer 04/08/13 bx    left  . Allergy   . CAD (coronary artery disease)  a. ant MI 65 tx with TPA/PTCA. b. Prior stenting procedures including RCA stent, DES to LCx 04/2011. c. [place] -> DES  to prox LAD 08/2012.   Marland Kitchen Hypertension     TAKES FOR H/O STROKE  . CVA (cerebral vascular accident)   . Stroke, acute, embolic     Nov 5001 , [place]  [facility] , was treated with TPA.   . 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 Path G1 IDC, 1.6 cm, neg margin, neg SLN     Review of Systems neg for any significant sore throat, dysphagia, itching, sneezing, nasal congestion or excess/ purulent secretions, fever, chills, sweats, unintended wt loss, pleuritic or exertional cp, hempoptysis, orthopnea pnd or change in chronic leg  swelling. Also denies presyncope, palpitations, heartburn, abdominal pain, nausea, vomiting, diarrhea or change in bowel or urinary habits, dysuria,hematuria, rash, arthralgias, visual complaints, headache, numbness weakness or ataxia.     Objective:   Physical Exam  Gen. Pleasant, well-nourished, in no distress, normal affect, chronically ill ENT - no lesions, no post nasal drip, oral thrush Neck: No JVD, no thyromegaly, no carotid bruits Lungs: no use of accessory muscles, no dullness to percussion, clear without rales or rhonchi  Cardiovascular: Rhythm regular, heart sounds  normal, no murmurs or gallops, no peripheral edema Abdomen: soft and non-tender, no hepatosplenomegaly, BS normal. Musculoskeletal: No deformities, no cyanosis or clubbing Neuro:  alert, non focal        Assessment & Plan:

## 2014-01-23 NOTE — Patient Instructions (Signed)
Get back on lasix & potassium OK to use advair instead of pulmicort & perforomist Use albuterol nebs for wheezing or shortness of breath (RESCUE) [facility] will [facility] you for pulse O2 Nystatin swish & swallow for thrush - rinse mouth after advair

## 2014-01-24 ENCOUNTER — Telehealth: Payer: Self-pay | Admitting: Pulmonary Disease

## 2014-01-24 NOTE — Telephone Encounter (Signed)
Result Note     clear   I spoke with patient about results and she verbalized understanding.

## 2014-01-25 ENCOUNTER — Inpatient Hospital Stay (HOSPITAL_COMMUNITY)
Admission: EM | Admit: 2014-01-25 | Discharge: 2014-02-06 | DRG: 207 | Disposition: A | Discharge status: Other Acute Inpatient Hospital | Payer: Medicare Other | Attending: Internal Medicine | Admitting: Internal Medicine

## 2014-01-25 ENCOUNTER — Encounter (HOSPITAL_COMMUNITY): Payer: Self-pay | Admitting: Emergency Medicine

## 2014-01-25 ENCOUNTER — Telehealth: Payer: Self-pay | Admitting: Pulmonary Disease

## 2014-01-25 ENCOUNTER — Emergency Department (HOSPITAL_COMMUNITY): Payer: Medicare Other

## 2014-01-25 DIAGNOSIS — J45901 Unspecified asthma with (acute) exacerbation: Secondary | ICD-10-CM

## 2014-01-25 DIAGNOSIS — K259 Gastric ulcer, unspecified as acute or chronic, without hemorrhage or perforation: Secondary | ICD-10-CM

## 2014-01-25 DIAGNOSIS — A4901 Methicillin susceptible Staphylococcus aureus infection, unspecified site: Secondary | ICD-10-CM | POA: Diagnosis present

## 2014-01-25 DIAGNOSIS — I2510 Atherosclerotic heart disease of native coronary artery without angina pectoris: Secondary | ICD-10-CM | POA: Diagnosis present

## 2014-01-25 DIAGNOSIS — I252 Old myocardial infarction: Secondary | ICD-10-CM

## 2014-01-25 DIAGNOSIS — T17908A Unspecified foreign body in respiratory tract, part unspecified causing other injury, initial encounter: Secondary | ICD-10-CM | POA: Diagnosis not present

## 2014-01-25 DIAGNOSIS — I509 Heart failure, unspecified: Secondary | ICD-10-CM | POA: Diagnosis present

## 2014-01-25 DIAGNOSIS — M899 Disorder of bone, unspecified: Secondary | ICD-10-CM

## 2014-01-25 DIAGNOSIS — I639 Cerebral infarction, unspecified: Secondary | ICD-10-CM

## 2014-01-25 DIAGNOSIS — C50912 Malignant neoplasm of unspecified site of left female breast: Secondary | ICD-10-CM

## 2014-01-25 DIAGNOSIS — F4310 Post-traumatic stress disorder, unspecified: Secondary | ICD-10-CM

## 2014-01-25 DIAGNOSIS — L089 Local infection of the skin and subcutaneous tissue, unspecified: Secondary | ICD-10-CM

## 2014-01-25 DIAGNOSIS — K5730 Diverticulosis of large intestine without perforation or abscess without bleeding: Secondary | ICD-10-CM

## 2014-01-25 DIAGNOSIS — K251 Acute gastric ulcer with perforation: Secondary | ICD-10-CM

## 2014-01-25 DIAGNOSIS — H8110 Benign paroxysmal vertigo, unspecified ear: Secondary | ICD-10-CM

## 2014-01-25 DIAGNOSIS — Z9981 Dependence on supplemental oxygen: Secondary | ICD-10-CM

## 2014-01-25 DIAGNOSIS — I4729 Other ventricular tachycardia: Secondary | ICD-10-CM

## 2014-01-25 DIAGNOSIS — C189 Malignant neoplasm of colon, unspecified: Secondary | ICD-10-CM

## 2014-01-25 DIAGNOSIS — R0902 Hypoxemia: Secondary | ICD-10-CM

## 2014-01-25 DIAGNOSIS — R197 Diarrhea, unspecified: Secondary | ICD-10-CM

## 2014-01-25 DIAGNOSIS — I10 Essential (primary) hypertension: Secondary | ICD-10-CM | POA: Diagnosis present

## 2014-01-25 DIAGNOSIS — E785 Hyperlipidemia, unspecified: Secondary | ICD-10-CM

## 2014-01-25 DIAGNOSIS — I6350 Cerebral infarction due to unspecified occlusion or stenosis of unspecified cerebral artery: Secondary | ICD-10-CM

## 2014-01-25 DIAGNOSIS — C50312 Malignant neoplasm of lower-inner quadrant of left female breast: Secondary | ICD-10-CM

## 2014-01-25 DIAGNOSIS — K219 Gastro-esophageal reflux disease without esophagitis: Secondary | ICD-10-CM | POA: Diagnosis present

## 2014-01-25 DIAGNOSIS — IMO0002 Reserved for concepts with insufficient information to code with codable children: Secondary | ICD-10-CM | POA: Diagnosis not present

## 2014-01-25 DIAGNOSIS — L0390 Cellulitis, unspecified: Secondary | ICD-10-CM

## 2014-01-25 DIAGNOSIS — K5900 Constipation, unspecified: Secondary | ICD-10-CM

## 2014-01-25 DIAGNOSIS — Z8719 Personal history of other diseases of the digestive system: Secondary | ICD-10-CM

## 2014-01-25 DIAGNOSIS — E870 Hyperosmolality and hypernatremia: Secondary | ICD-10-CM | POA: Diagnosis not present

## 2014-01-25 DIAGNOSIS — Z66 Do not resuscitate: Secondary | ICD-10-CM | POA: Diagnosis present

## 2014-01-25 DIAGNOSIS — C50919 Malignant neoplasm of unspecified site of unspecified female breast: Secondary | ICD-10-CM

## 2014-01-25 DIAGNOSIS — F514 Sleep terrors [night terrors]: Secondary | ICD-10-CM

## 2014-01-25 DIAGNOSIS — K529 Noninfective gastroenteritis and colitis, unspecified: Secondary | ICD-10-CM

## 2014-01-25 DIAGNOSIS — F419 Anxiety disorder, unspecified: Secondary | ICD-10-CM | POA: Diagnosis present

## 2014-01-25 DIAGNOSIS — Z853 Personal history of malignant neoplasm of breast: Secondary | ICD-10-CM

## 2014-01-25 DIAGNOSIS — Z8679 Personal history of other diseases of the circulatory system: Secondary | ICD-10-CM

## 2014-01-25 DIAGNOSIS — M19019 Primary osteoarthritis, unspecified shoulder: Secondary | ICD-10-CM

## 2014-01-25 DIAGNOSIS — Z9861 Coronary angioplasty status: Secondary | ICD-10-CM

## 2014-01-25 DIAGNOSIS — I200 Unstable angina: Secondary | ICD-10-CM

## 2014-01-25 DIAGNOSIS — Z978 Presence of other specified devices: Secondary | ICD-10-CM

## 2014-01-25 DIAGNOSIS — Z8249 Family history of ischemic heart disease and other diseases of the circulatory system: Secondary | ICD-10-CM

## 2014-01-25 DIAGNOSIS — D72829 Elevated white blood cell count, unspecified: Secondary | ICD-10-CM | POA: Diagnosis present

## 2014-01-25 DIAGNOSIS — R7309 Other abnormal glucose: Secondary | ICD-10-CM | POA: Diagnosis not present

## 2014-01-25 DIAGNOSIS — H60399 Other infective otitis externa, unspecified ear: Secondary | ICD-10-CM

## 2014-01-25 DIAGNOSIS — D509 Iron deficiency anemia, unspecified: Secondary | ICD-10-CM

## 2014-01-25 DIAGNOSIS — Z79899 Other long term (current) drug therapy: Secondary | ICD-10-CM

## 2014-01-25 DIAGNOSIS — I2589 Other forms of chronic ischemic heart disease: Secondary | ICD-10-CM | POA: Diagnosis present

## 2014-01-25 DIAGNOSIS — I472 Ventricular tachycardia: Secondary | ICD-10-CM

## 2014-01-25 DIAGNOSIS — R5383 Other fatigue: Secondary | ICD-10-CM

## 2014-01-25 DIAGNOSIS — E876 Hypokalemia: Secondary | ICD-10-CM | POA: Diagnosis not present

## 2014-01-25 DIAGNOSIS — K5732 Diverticulitis of large intestine without perforation or abscess without bleeding: Secondary | ICD-10-CM

## 2014-01-25 DIAGNOSIS — Z8711 Personal history of peptic ulcer disease: Secondary | ICD-10-CM

## 2014-01-25 DIAGNOSIS — L0291 Cutaneous abscess, unspecified: Secondary | ICD-10-CM

## 2014-01-25 DIAGNOSIS — N760 Acute vaginitis: Secondary | ICD-10-CM

## 2014-01-25 DIAGNOSIS — Z8673 Personal history of transient ischemic attack (TIA), and cerebral infarction without residual deficits: Secondary | ICD-10-CM

## 2014-01-25 DIAGNOSIS — R5381 Other malaise: Secondary | ICD-10-CM

## 2014-01-25 DIAGNOSIS — Z860100 Personal history of colon polyps, unspecified: Secondary | ICD-10-CM

## 2014-01-25 DIAGNOSIS — J449 Chronic obstructive pulmonary disease, unspecified: Secondary | ICD-10-CM

## 2014-01-25 DIAGNOSIS — F411 Generalized anxiety disorder: Secondary | ICD-10-CM | POA: Diagnosis present

## 2014-01-25 DIAGNOSIS — B379 Candidiasis, unspecified: Secondary | ICD-10-CM

## 2014-01-25 DIAGNOSIS — I5020 Unspecified systolic (congestive) heart failure: Secondary | ICD-10-CM | POA: Diagnosis present

## 2014-01-25 DIAGNOSIS — T380X5A Adverse effect of glucocorticoids and synthetic analogues, initial encounter: Secondary | ICD-10-CM | POA: Diagnosis not present

## 2014-01-25 DIAGNOSIS — S82899A Other fracture of unspecified lower leg, initial encounter for closed fracture: Secondary | ICD-10-CM

## 2014-01-25 DIAGNOSIS — R1310 Dysphagia, unspecified: Secondary | ICD-10-CM

## 2014-01-25 DIAGNOSIS — Z803 Family history of malignant neoplasm of breast: Secondary | ICD-10-CM

## 2014-01-25 DIAGNOSIS — R509 Fever, unspecified: Secondary | ICD-10-CM

## 2014-01-25 DIAGNOSIS — J4489 Other specified chronic obstructive pulmonary disease: Secondary | ICD-10-CM

## 2014-01-25 DIAGNOSIS — Z96619 Presence of unspecified artificial shoulder joint: Secondary | ICD-10-CM

## 2014-01-25 DIAGNOSIS — J441 Chronic obstructive pulmonary disease with (acute) exacerbation: Principal | ICD-10-CM | POA: Diagnosis present

## 2014-01-25 DIAGNOSIS — J9620 Acute and chronic respiratory failure, unspecified whether with hypoxia or hypercapnia: Secondary | ICD-10-CM | POA: Diagnosis present

## 2014-01-25 DIAGNOSIS — K1120 Sialoadenitis, unspecified: Secondary | ICD-10-CM

## 2014-01-25 DIAGNOSIS — Z87891 Personal history of nicotine dependence: Secondary | ICD-10-CM

## 2014-01-25 DIAGNOSIS — M549 Dorsalgia, unspecified: Secondary | ICD-10-CM

## 2014-01-25 DIAGNOSIS — R159 Full incontinence of feces: Secondary | ICD-10-CM

## 2014-01-25 DIAGNOSIS — J9621 Acute and chronic respiratory failure with hypoxia: Secondary | ICD-10-CM | POA: Diagnosis present

## 2014-01-25 DIAGNOSIS — M949 Disorder of cartilage, unspecified: Secondary | ICD-10-CM

## 2014-01-25 LAB — BLOOD GAS, ARTERIAL
Acid-base deficit: 1.2 mmol/L (ref 0.0–2.0)
BICARBONATE: 24.3 meq/L — AB (ref 20.0–24.0)
Drawn by: 232811
O2 Content: 3 L/min
O2 SAT: 91.7 %
PATIENT TEMPERATURE: 98.9
TCO2: 22.1 mmol/L (ref 0–100)
pCO2 arterial: 46.6 mmHg — ABNORMAL HIGH (ref 35.0–45.0)
pH, Arterial: 7.337 — ABNORMAL LOW (ref 7.350–7.450)
pO2, Arterial: 69.3 mmHg — ABNORMAL LOW (ref 80.0–100.0)

## 2014-01-25 LAB — PRO B NATRIURETIC PEPTIDE: PRO B NATRI PEPTIDE: 485.2 pg/mL — AB (ref 0–125)

## 2014-01-25 LAB — CBC
HCT: 38.9 % (ref 36.0–46.0)
Hemoglobin: 12.2 g/dL (ref 12.0–15.0)
MCH: 27.1 pg (ref 26.0–34.0)
MCHC: 31.4 g/dL (ref 30.0–36.0)
MCV: 86.4 fL (ref 78.0–100.0)
PLATELETS: 171 10*3/uL (ref 150–400)
RBC: 4.5 MIL/uL (ref 3.87–5.11)
RDW: 14.7 % (ref 11.5–15.5)
WBC: 9.1 10*3/uL (ref 4.0–10.5)

## 2014-01-25 LAB — BASIC METABOLIC PANEL
BUN: 15 mg/dL (ref 6–23)
CO2: 24 mEq/L (ref 19–32)
Calcium: 9 mg/dL (ref 8.4–10.5)
Chloride: 103 mEq/L (ref 96–112)
Creatinine, Ser: 0.86 mg/dL (ref 0.50–1.10)
GFR, EST AFRICAN AMERICAN: 76 mL/min — AB (ref >90)
GFR, EST NON AFRICAN AMERICAN: 66 mL/min — AB (ref >90)
Glucose, Bld: 130 mg/dL — ABNORMAL HIGH (ref 70–99)
POTASSIUM: 4 meq/L (ref 3.7–5.3)
SODIUM: 141 meq/L (ref 137–147)

## 2014-01-25 LAB — I-STAT TROPONIN, ED: TROPONIN I, POC: 0.01 ng/mL (ref 0.00–0.08)

## 2014-01-25 LAB — I-STAT CG4 LACTIC ACID, ED: LACTIC ACID, VENOUS: 1.49 mmol/L (ref 0.5–2.2)

## 2014-01-25 MED ORDER — ACETAMINOPHEN 325 MG PO TABS
650.0000 mg | ORAL_TABLET | Freq: Once | ORAL | Status: AC
  Administered 2014-01-25: 650 mg via ORAL
  Filled 2014-01-25: qty 2

## 2014-01-25 MED ORDER — TRAMADOL HCL 50 MG PO TABS
50.0000 mg | ORAL_TABLET | Freq: Once | ORAL | Status: AC
  Administered 2014-01-25: 50 mg via ORAL
  Filled 2014-01-25: qty 1

## 2014-01-25 MED ORDER — ALBUTEROL (5 MG/ML) CONTINUOUS INHALATION SOLN
15.0000 mg/h | INHALATION_SOLUTION | Freq: Once | RESPIRATORY_TRACT | Status: AC
  Administered 2014-01-25: 15 mg/h via RESPIRATORY_TRACT
  Filled 2014-01-25: qty 20

## 2014-01-25 MED ORDER — LORAZEPAM 2 MG/ML IJ SOLN
0.5000 mg | Freq: Once | INTRAMUSCULAR | Status: AC
  Administered 2014-01-25: 0.5 mg via INTRAVENOUS
  Filled 2014-01-25: qty 1

## 2014-01-25 MED ORDER — PIPERACILLIN-TAZOBACTAM 3.375 G IVPB 30 MIN
3.3750 g | Freq: Once | INTRAVENOUS | Status: AC
  Administered 2014-01-25: 3.375 g via INTRAVENOUS
  Filled 2014-01-25: qty 50

## 2014-01-25 MED ORDER — METHYLPREDNISOLONE SODIUM SUCC 125 MG IJ SOLR
125.0000 mg | Freq: Once | INTRAMUSCULAR | Status: AC
  Administered 2014-01-25: 125 mg via INTRAVENOUS
  Filled 2014-01-25: qty 2

## 2014-01-25 MED ORDER — SODIUM CHLORIDE 0.9 % IV SOLN
INTRAVENOUS | Status: DC
  Administered 2014-01-25: via INTRAVENOUS

## 2014-01-25 MED ORDER — VANCOMYCIN HCL IN DEXTROSE 1-5 GM/200ML-% IV SOLN
1000.0000 mg | Freq: Once | INTRAVENOUS | Status: AC
  Administered 2014-01-25: 1000 mg via INTRAVENOUS
  Filled 2014-01-25: qty 200

## 2014-01-25 MED ORDER — IOHEXOL 350 MG/ML SOLN: 100.0000 mL | Freq: Once | INTRAVENOUS | Status: AC | PRN

## 2014-01-25 MED ORDER — HYDROCODONE-HOMATROPINE 5-1.5 MG/5ML PO SYRP: 5.0000 mL | ORAL_SOLUTION | Freq: Two times a day (BID) | ORAL | Status: DC | PRN

## 2014-01-25 NOTE — Assessment & Plan Note (Signed)
Due to her prolonged recovery from recent acute illness, I advised her against further surgery for at least 6 months. Hopefully she will recover to her prior level of functioning

## 2014-01-25 NOTE — Telephone Encounter (Signed)
I called and spoke with pt. She reports she is still coughing a lot. She tried taking delsym OTC. Did not help at all. She had 2 tsp of hycodan cough syrup and she took this. This has helped with cough and is asking for RX. Pt aware hycodan RX can no longer be called in and will have to be picked up if RA approves this. She voiced her understanding. Please advise Dr. [name] thanks  Allergies  Allergen Reactions  . Escitalopram Oxalate Shortness Of Breath, Diarrhea and Nausea And Vomiting    Hot flashes, dizziness  . Indomethacin Other (See Comments)    "made me very very dizzy and made me have vertigo" (08/31/2012)  . Nsaids     Jerking, hot flashes, nausea  . Codeine Nausea And Vomiting    "I can take codeine in my cough medicine" (08/31/2012)  . Sulfonamide Derivatives Other (See Comments)    Drug induced hepatitis in 1989

## 2014-01-25 NOTE — Telephone Encounter (Signed)
Hycodan 39ml po bid prn x 174ml

## 2014-01-25 NOTE — ED Notes (Signed)
CT requested to scan patient ASAP

## 2014-01-25 NOTE — ED Notes (Signed)
Pt arrives by EMS with complaints shortness of breath and non-productive cough for the past 2 days.  Pt has hx new onset CHF since intubation in January.  Pt has not taken her Lasix since April 1st because she did not have her potassium supplement.  Discharged from [facility] last Thursday and daughter states she has put herself on her own "medication regimen".  Daughter states patient called her this evening and told her she was having respiratory distress-had used nebs x 2 at home with no relief and neb per EMS.  BP 102/56, HR 126 RR 24 Initial O2 sat was 88% on nasal cannula

## 2014-01-25 NOTE — Assessment & Plan Note (Signed)
Get back on lasix & potassium OK to use advair instead of pulmicort & perforomist Use albuterol nebs for wheezing or shortness of breath (RESCUE) [facility] will [facility] you for pulse O2

## 2014-01-25 NOTE — ED Provider Notes (Signed)
TIME SEEN: 10:20 PM  CHIEF COMPLAINT: Respiratory distress  HPI: Patient is a 73 y.o. F history of COPD, TIA, ischemic cardiomyopathy on chronic oxygen, hypertension, prior breast cancer currently in remission who presents emergency room with shortness of breath that started 2 days ago. She has had a nonproductive cough. She denies any fevers or chills. She is having chest tightness. She has received 3 breathing treatments without relief of symptoms. She was recently admitted to the hospital and intubated for COPD, pneumonia. Denies a history of PE or DVT. No lower extremity swelling or pain.  ROS: See HPI Constitutional: no fever  Eyes: no drainage  ENT: no runny nose   Cardiovascular:  no chest pain  Resp: no SOB  GI: no vomiting GU: no dysuria Integumentary: no rash  Allergy: no hives  Musculoskeletal: no leg swelling  Neurological: no slurred speech ROS otherwise negative  PAST MEDICAL HISTORY/PAST SURGICAL HISTORY:  Past Medical History  Diagnosis Date  . Other emphysema   . Unspecified arthropathy, shoulder region   . Gastric ulcer, unspecified as acute or chronic, without mention of hemorrhage, perforation, or obstruction   . Benign paroxysmal positional vertigo   . COPD (chronic obstructive pulmonary disease)   . Diverticula of colon   . Osteopenia   . Personal history of colonic polyps 2007    tubular adenoma high grade dysplasia  . Diverticulitis   . Hyperlipemia   . Fracture of ankle, bimalleolar, right, closed   . Hypoxemia   . C. difficile diarrhea   . Varicose vein   . Asthma   . GERD (gastroesophageal reflux disease)   . Hepatitis 1989  . Arthritis     "both shoulders"   . TIA (transient ischemic attack) 04/2008; 08/21/2012    "slight droop left lower lip after 08/21/12 TIA"  . On home oxygen therapy   . Ischemic cardiomyopathy     a. EF 45-50% 2011. b. EF 35-40% by cath 09/01/12.  . Night terrors, adult     dr Leonie Man referred  for PSG in 2011, and again in  2013   . Breast cancer 04/08/13 bx    left  . Allergy   . CAD (coronary artery disease)     a. ant MI 62 tx with TPA/PTCA. b. Prior stenting procedures including RCA stent, DES to LCx 04/2011. c. [place] -> DES  to prox LAD 08/2012.   Marland Kitchen Hypertension     TAKES FOR H/O STROKE  . CVA (cerebral vascular accident)   . Stroke, acute, embolic     Nov 7262 , [place]  [facility] , was treated with TPA.   . 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 Path G1 IDC, 1.6 cm, neg margin, neg SLN     MEDICATIONS:  Prior to Admission medications   Medication Sig Start Date End Date Taking? Authorizing Provider  anastrozole (ARIMIDEX) 1 MG tablet Take 1 mg by mouth daily.    Historical Provider, MD  aspirin 81 MG tablet Take 81 mg by mouth daily.    Historical Provider, MD  B Complex Vitamins (B-COMPLEX/B-12 PO) Take 1 tablet by mouth daily.    Historical Provider, MD  budesonide (PULMICORT) 0.5 MG/2ML nebulizer solution Take 0.5 mg by nebulization 2 (two) times daily.    Historical Provider, MD  busPIRone (BUSPAR) 5 MG tablet Take 15 mg by mouth 2 (two) times  daily. 11/26/13   Cecilie Kicks, NP  Cholecalciferol (VITAMIN D) 2000 UNITS CAPS Take 1 capsule by mouth 2 (two) times daily.    Historical Provider, MD  clonazePAM (KLONOPIN) 0.5 MG tablet 2 (two) times daily. Take 1/4 tablet by mouth every morning for anxiety 12/20/13   Estill Dooms, MD  clopidogrel (PLAVIX) 75 MG tablet Take 1 tablet (75 mg total) by mouth daily. 01/20/14   Burnell Blanks, MD  CRANBERRY PO Take 1 tablet by mouth daily.    Historical Provider, MD  formoterol (PERFOROMIST) 20 MCG/2ML nebulizer solution Take 20 mcg by nebulization 2 (two) times daily.    Historical Provider, MD  furosemide (LASIX) 20 MG tablet Take 1 tablet (20 mg total) by mouth daily. 11/26/13   Cecilie Kicks, NP  gabapentin (NEURONTIN) 300 MG capsule  Take 300 mg by mouth at bedtime.    Historical Provider, MD  HYDROcodone-homatropine (HYCODAN) 5-1.5 MG/5ML syrup Take 5 mLs by mouth 2 (two) times daily as needed for cough. 01/25/14   Rigoberto Noel, MD  lidocaine (LIDODERM) 5 % Place 1 patch onto the skin daily. Remove & Discard patch within 12 hours or as directed by MD    Historical Provider, MD  metoprolol tartrate (LOPRESSOR) 25 MG tablet Take 12.5 mg by mouth daily.    Historical Provider, MD  Multiple Vitamin (MULTIVITAMIN) capsule Take 1 capsule by mouth daily.    Historical Provider, MD  nystatin (MYCOSTATIN) 100000 UNIT/ML suspension Take 5 mLs (500,000 Units total) by mouth 2 (two) times daily. 01/23/14   Rigoberto Noel, MD  Omega-3 Fatty Acids (FISH OIL) 1200 MG CAPS Take 1 capsule by mouth daily.    Historical Provider, MD  oxybutynin (DITROPAN) 5 MG tablet Take 5 mg by mouth at bedtime.     Historical Provider, MD  pantoprazole (PROTONIX) 40 MG tablet Take 1 tablet (40 mg total) by mouth 2 (two) times daily. 12/23/13   Burnell Blanks, MD  potassium chloride SA (K-DUR,KLOR-CON) 20 MEQ tablet Take 1 tablet (20 mEq total) by mouth once. 11/26/13   Cecilie Kicks, NP  Pyridoxine HCl (VITAMIN B-6 PO) Take 1 tablet by mouth daily.    Historical Provider, MD  simvastatin (ZOCOR) 40 MG tablet Take 40 mg by mouth daily.    Historical Provider, MD  traMADol (ULTRAM) 50 MG tablet Take one tablet by mouth every 6 hours as needed 12/30/13   Mardene Celeste, NP    ALLERGIES:  Allergies  Allergen Reactions  . Escitalopram Oxalate Shortness Of Breath, Diarrhea and Nausea And Vomiting    Hot flashes, dizziness  . Indomethacin Other (See Comments)    "made me very very dizzy and made me have vertigo" (08/31/2012)  . Nsaids     Jerking, hot flashes, nausea  . Codeine Nausea And Vomiting    "I can take codeine in my cough medicine" (08/31/2012)  . Sulfonamide Derivatives Other (See Comments)    Drug induced hepatitis in 1989    SOCIAL HISTORY:   History  Substance Use Topics  . Smoking status: Former Smoker -- 1.00 packs/day for 40 years    Types: Cigarettes    Quit date: 10/20/1997  . Smokeless tobacco: Never Used  . Alcohol Use: 1.8 oz/week    3 Glasses of wine per week     Comment: occasionally    FAMILY HISTORY: Family History  Problem Relation Age of Onset  . Breast cancer Sister     x 2  . Colon  cancer Neg Hx   . Diabetes Paternal Grandmother   . Uterine cancer Paternal Grandmother   . Lung cancer Paternal Grandmother   . Heart disease Mother   . Alcohol abuse Mother   . Heart disease Father   . Pancreatic cancer Cousin   . Colon polyps Sister   . Diabetes Maternal Uncle   . Heart disease Maternal Grandfather   . Heart disease Paternal Grandfather   . Stroke Paternal Grandfather     EXAM: BP 99/59  Pulse 129  Temp(Src) 100.6 F (38.1 C) (Rectal)  Resp 24  SpO2 95% CONSTITUTIONAL: Alert and oriented and responds appropriately to questions. Well-appearing; well-nourished HEAD: Normocephalic EYES: Conjunctivae clear, PERRL ENT: normal nose; no rhinorrhea; moist mucous membranes; pharynx without lesions noted NECK: Supple, no meningismus, no LAD  CARD: Regular and tachycardic; S1 and S2 appreciated; no murmurs, no clicks, no rubs, no gallops RESP: Normal chest excursion without splinting; patient is tachypneic, increased work of breathing, diminished aeration diffusely, no rhonchi or rales, no wheezing, moderate respiratory distress ABD/GI: Normal bowel sounds; non-distended; soft, non-tender, no rebound, no guarding BACK:  The back appears normal and is non-tender to palpation, there is no CVA tenderness EXT: Normal ROM in all joints; non-tender to palpation; no edema; normal capillary refill; no cyanosis    SKIN: Normal color for age and race; warm NEURO: Moves all extremities equally PSYCH: The patient's mood and manner are appropriate. Grooming and personal hygiene are appropriate.  MEDICAL  DECISION MAKING: Patient here with moderate respiratory distress. Differential includes COPD exacerbation versus pneumonia versus pulmonary embolus. We'll obtain cardiac labs, chest x-ray. We'll give continuous breathing treatments, Solu-Medrol broad-spectrum antibiotics. We'll obtain a CT of her chest.  ED PROGRESS: Patient does have a fever in the emergency department but no leukocytosis. Her BNP is less than 500. Troponin negative. Lactate normal. Chest x-ray shows emphysema without superimposed pneumonia or edema. CT chest is pending.   11:46 PM  Pt's blood gases reassuring. She does have hypoxia despite being on oxygen. CT of her chest is pending.   12:08 AM  D/w Dr. Shanon Brow with hospitalist for admission to step down. Discussed with Dr. Pascal Lux with radiology. No obvious PE or infiltrate.    EKG Interpretation  Date/Time:  Wednesday January 25 2014 21:47:03 EDT Ventricular Rate:  121 PR Interval:  158 QRS Duration: 65 QT Interval:  312 QTC Calculation: 443 R Axis:   75 Text Interpretation:  Sinus tachycardia Anterior infarct, old occasional PVC No significant change since last tracing Confirmed by Ysenia Filice,  DO, [place] ([id number]) on 01/25/2014 10:21:07 PM         CRITICAL CARE Performed by: Delice Bison Skylene Deremer   Total critical care time: 45 minutes  Critical care time was exclusive of separately billable procedures and treating other patients.  Critical care was necessary to treat or prevent imminent or life-threatening deterioration.  Critical care was time spent personally by me on the following activities: development of treatment plan with patient and/or surrogate as well as nursing, discussions with consultants, evaluation of patient's response to treatment, examination of patient, obtaining history from patient or surrogate, ordering and performing treatments and interventions, ordering and review of laboratory studies, ordering and review of radiographic studies, pulse oximetry and  re-evaluation of patient's condition.   Murray, DO 01/26/14 [phone]

## 2014-01-25 NOTE — Assessment & Plan Note (Signed)
Nystatin swish & swallow for thrush - rinse mouth after advair

## 2014-01-25 NOTE — ED Notes (Signed)
Bed: RESA Expected date:  Expected time:  Means of arrival:  Comments: EMS resp distress 

## 2014-01-25 NOTE — ED Notes (Signed)
EKG given to EDP, Ward,MD., for review. 

## 2014-01-25 NOTE — ED Notes (Signed)
Patient transported to CT 

## 2014-01-25 NOTE — ED Notes (Signed)
Pt weighs 165lbs Pt height 5'4.5 Both stated.

## 2014-01-25 NOTE — Telephone Encounter (Signed)
RX printed and will have him sign this.  Pt will pick this up around 2 PM.  Nothing further needed

## 2014-01-26 ENCOUNTER — Encounter (HOSPITAL_COMMUNITY): Payer: Self-pay

## 2014-01-26 DIAGNOSIS — J441 Chronic obstructive pulmonary disease with (acute) exacerbation: Principal | ICD-10-CM

## 2014-01-26 DIAGNOSIS — F411 Generalized anxiety disorder: Secondary | ICD-10-CM

## 2014-01-26 DIAGNOSIS — J449 Chronic obstructive pulmonary disease, unspecified: Secondary | ICD-10-CM

## 2014-01-26 DIAGNOSIS — J9620 Acute and chronic respiratory failure, unspecified whether with hypoxia or hypercapnia: Secondary | ICD-10-CM

## 2014-01-26 DIAGNOSIS — Z8711 Personal history of peptic ulcer disease: Secondary | ICD-10-CM

## 2014-01-26 DIAGNOSIS — J9621 Acute and chronic respiratory failure with hypoxia: Secondary | ICD-10-CM | POA: Diagnosis present

## 2014-01-26 DIAGNOSIS — R0902 Hypoxemia: Secondary | ICD-10-CM

## 2014-01-26 LAB — CBC
HEMATOCRIT: 36.9 % (ref 36.0–46.0)
Hemoglobin: 11.4 g/dL — ABNORMAL LOW (ref 12.0–15.0)
MCH: 26.6 pg (ref 26.0–34.0)
MCHC: 30.9 g/dL (ref 30.0–36.0)
MCV: 86 fL (ref 78.0–100.0)
PLATELETS: 157 10*3/uL (ref 150–400)
RBC: 4.29 MIL/uL (ref 3.87–5.11)
RDW: 14.9 % (ref 11.5–15.5)
WBC: 8.1 10*3/uL (ref 4.0–10.5)

## 2014-01-26 LAB — INFLUENZA PANEL BY PCR (TYPE A & B)
H1N1 flu by pcr: NOT DETECTED
INFLAPCR: NEGATIVE
INFLBPCR: NEGATIVE

## 2014-01-26 LAB — BASIC METABOLIC PANEL
BUN: 14 mg/dL (ref 6–23)
CO2: 20 meq/L (ref 19–32)
Calcium: 8.3 mg/dL — ABNORMAL LOW (ref 8.4–10.5)
Chloride: 103 mEq/L (ref 96–112)
Creatinine, Ser: 0.8 mg/dL (ref 0.50–1.10)
GFR calc Af Amer: 83 mL/min — ABNORMAL LOW (ref >90)
GFR calc non Af Amer: 72 mL/min — ABNORMAL LOW (ref >90)
Glucose, Bld: 197 mg/dL — ABNORMAL HIGH (ref 70–99)
Potassium: 3.7 mEq/L (ref 3.7–5.3)
SODIUM: 139 meq/L (ref 137–147)

## 2014-01-26 LAB — MRSA PCR SCREENING: MRSA by PCR: NEGATIVE

## 2014-01-26 MED ORDER — VANCOMYCIN HCL IN DEXTROSE 750-5 MG/150ML-% IV SOLN
750.0000 mg | Freq: Once | INTRAVENOUS | Status: DC
  Filled 2014-01-26: qty 150

## 2014-01-26 MED ORDER — ADULT MULTIVITAMIN W/MINERALS CH
0.5000 | ORAL_TABLET | Freq: Two times a day (BID) | ORAL | Status: DC
  Filled 2014-01-26 (×3): qty 1

## 2014-01-26 MED ORDER — SODIUM CHLORIDE 0.9 % IV SOLN
250.0000 mL | INTRAVENOUS | Status: DC | PRN
  Administered 2014-01-26: 500 mL via INTRAVENOUS

## 2014-01-26 MED ORDER — ADULT MULTIVITAMIN W/MINERALS CH
1.0000 | ORAL_TABLET | Freq: Every day | ORAL | Status: DC
  Administered 2014-01-26 – 2014-02-06 (×11): 1 via ORAL
  Filled 2014-01-26 (×12): qty 1

## 2014-01-26 MED ORDER — METHYLPREDNISOLONE SODIUM SUCC 125 MG IJ SOLR
80.0000 mg | Freq: Two times a day (BID) | INTRAMUSCULAR | Status: DC
  Administered 2014-01-26 – 2014-01-27 (×2): 80 mg via INTRAVENOUS
  Filled 2014-01-26 (×4): qty 1.28

## 2014-01-26 MED ORDER — IPRATROPIUM-ALBUTEROL 0.5-2.5 (3) MG/3ML IN SOLN
3.0000 mL | Freq: Four times a day (QID) | RESPIRATORY_TRACT | Status: DC
  Administered 2014-01-26: 3 mL via RESPIRATORY_TRACT

## 2014-01-26 MED ORDER — PANTOPRAZOLE SODIUM 40 MG PO TBEC
40.0000 mg | DELAYED_RELEASE_TABLET | Freq: Two times a day (BID) | ORAL | Status: DC
  Administered 2014-01-26 – 2014-01-27 (×5): 40 mg via ORAL
  Filled 2014-01-26 (×7): qty 1

## 2014-01-26 MED ORDER — ALBUTEROL SULFATE HFA 108 (90 BASE) MCG/ACT IN AERS: 1.0000 | INHALATION_SPRAY | Freq: Four times a day (QID) | RESPIRATORY_TRACT | Status: DC | PRN

## 2014-01-26 MED ORDER — CLONAZEPAM 0.5 MG PO TABS
0.5000 mg | ORAL_TABLET | Freq: Two times a day (BID) | ORAL | Status: DC
  Administered 2014-01-26 – 2014-01-28 (×7): 0.5 mg via ORAL
  Filled 2014-01-26 (×7): qty 1

## 2014-01-26 MED ORDER — PIPERACILLIN-TAZOBACTAM 3.375 G IVPB
3.3750 g | Freq: Three times a day (TID) | INTRAVENOUS | Status: DC
  Administered 2014-01-26: 3.375 g via INTRAVENOUS
  Filled 2014-01-26 (×2): qty 50

## 2014-01-26 MED ORDER — LORAZEPAM 0.5 MG PO TABS
0.2500 mg | ORAL_TABLET | Freq: Two times a day (BID) | ORAL | Status: DC | PRN
  Administered 2014-01-26: 0.25 mg via ORAL
  Filled 2014-01-26: qty 1

## 2014-01-26 MED ORDER — IPRATROPIUM BROMIDE 0.02 % IN SOLN
0.5000 mg | Freq: Four times a day (QID) | RESPIRATORY_TRACT | Status: DC
  Administered 2014-01-26: 0.5 mg via RESPIRATORY_TRACT
  Filled 2014-01-26: qty 2.5

## 2014-01-26 MED ORDER — LEVOFLOXACIN 750 MG PO TABS
750.0000 mg | ORAL_TABLET | Freq: Every day | ORAL | Status: AC
  Administered 2014-01-26 – 2014-01-30 (×5): 750 mg via ORAL
  Filled 2014-01-26 (×5): qty 1

## 2014-01-26 MED ORDER — IPRATROPIUM-ALBUTEROL 0.5-2.5 (3) MG/3ML IN SOLN
3.0000 mL | RESPIRATORY_TRACT | Status: DC
  Administered 2014-01-26 – 2014-02-02 (×38): 3 mL via RESPIRATORY_TRACT
  Filled 2014-01-26 (×36): qty 3

## 2014-01-26 MED ORDER — ALBUTEROL SULFATE (2.5 MG/3ML) 0.083% IN NEBU
2.5000 mg | INHALATION_SOLUTION | RESPIRATORY_TRACT | Status: DC | PRN
  Administered 2014-01-26 – 2014-02-01 (×6): 2.5 mg via RESPIRATORY_TRACT
  Filled 2014-01-26 (×5): qty 3

## 2014-01-26 MED ORDER — ANASTROZOLE 1 MG PO TABS
1.0000 mg | ORAL_TABLET | Freq: Every day | ORAL | Status: DC
  Administered 2014-01-26 – 2014-01-27 (×2): 1 mg via ORAL
  Filled 2014-01-26 (×2): qty 1

## 2014-01-26 MED ORDER — ONDANSETRON HCL 4 MG/2ML IJ SOLN: 4.0000 mg | Freq: Four times a day (QID) | INTRAMUSCULAR | Status: DC | PRN

## 2014-01-26 MED ORDER — ENOXAPARIN SODIUM 40 MG/0.4ML ~~LOC~~ SOLN
40.0000 mg | SUBCUTANEOUS | Status: DC
  Administered 2014-01-26 – 2014-02-06 (×12): 40 mg via SUBCUTANEOUS
  Filled 2014-01-26 (×12): qty 0.4

## 2014-01-26 MED ORDER — ASPIRIN EC 81 MG PO TBEC
81.0000 mg | DELAYED_RELEASE_TABLET | Freq: Every day | ORAL | Status: DC
  Administered 2014-01-26 – 2014-01-27 (×2): 81 mg via ORAL
  Filled 2014-01-26 (×2): qty 1

## 2014-01-26 MED ORDER — HYDROCODONE-HOMATROPINE 5-1.5 MG/5ML PO SYRP
5.0000 mL | ORAL_SOLUTION | Freq: Two times a day (BID) | ORAL | Status: DC | PRN
  Administered 2014-01-26 – 2014-01-27 (×3): 5 mL via ORAL
  Filled 2014-01-26 (×4): qty 5

## 2014-01-26 MED ORDER — TRAMADOL HCL 50 MG PO TABS
75.0000 mg | ORAL_TABLET | Freq: Four times a day (QID) | ORAL | Status: DC | PRN
  Administered 2014-01-27 (×2): 75 mg via ORAL
  Filled 2014-01-26 (×2): qty 2

## 2014-01-26 MED ORDER — GABAPENTIN 300 MG PO CAPS
300.0000 mg | ORAL_CAPSULE | Freq: Every day | ORAL | Status: DC
  Administered 2014-01-26 – 2014-02-05 (×10): 300 mg via ORAL
  Filled 2014-01-26 (×12): qty 1

## 2014-01-26 MED ORDER — BUSPIRONE HCL 15 MG PO TABS
15.0000 mg | ORAL_TABLET | Freq: Two times a day (BID) | ORAL | Status: DC
  Administered 2014-01-26 – 2014-01-28 (×7): 15 mg via ORAL
  Filled 2014-01-26 (×9): qty 1

## 2014-01-26 MED ORDER — NYSTATIN 100000 UNIT/ML MT SUSP
5.0000 mL | Freq: Two times a day (BID) | OROMUCOSAL | Status: DC
  Administered 2014-01-26 – 2014-02-06 (×21): 500000 [IU] via ORAL
  Filled 2014-01-26 (×25): qty 5

## 2014-01-26 MED ORDER — ALBUTEROL SULFATE (2.5 MG/3ML) 0.083% IN NEBU
2.5000 mg | INHALATION_SOLUTION | Freq: Four times a day (QID) | RESPIRATORY_TRACT | Status: DC | PRN
Start: 2014-01-26 — End: 2014-01-26
  Filled 2014-01-26: qty 3

## 2014-01-26 MED ORDER — OXYBUTYNIN CHLORIDE 5 MG PO TABS
5.0000 mg | ORAL_TABLET | Freq: Every day | ORAL | Status: DC
  Administered 2014-01-26 – 2014-02-05 (×10): 5 mg via ORAL
  Filled 2014-01-26 (×12): qty 1

## 2014-01-26 MED ORDER — LORAZEPAM 2 MG/ML IJ SOLN
1.0000 mg | Freq: Once | INTRAMUSCULAR | Status: AC
  Administered 2014-01-26: 1 mg via INTRAVENOUS
  Filled 2014-01-26: qty 1

## 2014-01-26 MED ORDER — SIMVASTATIN 40 MG PO TABS
40.0000 mg | ORAL_TABLET | Freq: Every day | ORAL | Status: DC
  Administered 2014-01-27 – 2014-02-05 (×8): 40 mg via ORAL
  Filled 2014-01-26 (×12): qty 1

## 2014-01-26 MED ORDER — CLOPIDOGREL BISULFATE 75 MG PO TABS
75.0000 mg | ORAL_TABLET | Freq: Every day | ORAL | Status: DC
  Administered 2014-01-26 – 2014-02-06 (×11): 75 mg via ORAL
  Filled 2014-01-26 (×12): qty 1

## 2014-01-26 MED ORDER — FUROSEMIDE 20 MG PO TABS
20.0000 mg | ORAL_TABLET | Freq: Every day | ORAL | Status: DC
  Administered 2014-01-26 – 2014-01-27 (×2): 20 mg via ORAL
  Filled 2014-01-26 (×4): qty 1

## 2014-01-26 MED ORDER — ONDANSETRON HCL 4 MG PO TABS: 4.0000 mg | ORAL_TABLET | Freq: Four times a day (QID) | ORAL | Status: DC | PRN

## 2014-01-26 MED ORDER — IOHEXOL 350 MG/ML SOLN
100.0000 mL | Freq: Once | INTRAVENOUS | Status: AC | PRN
  Administered 2014-01-26: 100 mL via INTRAVENOUS

## 2014-01-26 MED ORDER — SODIUM CHLORIDE 0.9 % IJ SOLN: 3.0000 mL | INTRAMUSCULAR | Status: DC | PRN

## 2014-01-26 MED ORDER — METOPROLOL TARTRATE 12.5 MG HALF TABLET
12.5000 mg | ORAL_TABLET | Freq: Every day | ORAL | Status: DC
  Administered 2014-01-26 – 2014-01-27 (×2): 12.5 mg via ORAL
  Filled 2014-01-26 (×2): qty 1

## 2014-01-26 MED ORDER — VANCOMYCIN HCL IN DEXTROSE 750-5 MG/150ML-% IV SOLN
750.0000 mg | Freq: Two times a day (BID) | INTRAVENOUS | Status: DC
  Filled 2014-01-26: qty 150

## 2014-01-26 MED ORDER — METHYLPREDNISOLONE SODIUM SUCC 125 MG IJ SOLR
80.0000 mg | Freq: Four times a day (QID) | INTRAMUSCULAR | Status: DC
  Administered 2014-01-26: 80 mg via INTRAVENOUS
  Filled 2014-01-26 (×6): qty 1.28

## 2014-01-26 MED ORDER — POTASSIUM CHLORIDE CRYS ER 20 MEQ PO TBCR
20.0000 meq | EXTENDED_RELEASE_TABLET | Freq: Every day | ORAL | Status: DC
  Administered 2014-01-26 – 2014-01-31 (×5): 20 meq via ORAL
  Filled 2014-01-26 (×7): qty 1

## 2014-01-26 MED ORDER — ALBUTEROL SULFATE (2.5 MG/3ML) 0.083% IN NEBU: 2.5000 mg | INHALATION_SOLUTION | Freq: Four times a day (QID) | RESPIRATORY_TRACT | Status: DC

## 2014-01-26 MED ORDER — ALBUTEROL SULFATE (2.5 MG/3ML) 0.083% IN NEBU: 2.5000 mg | INHALATION_SOLUTION | RESPIRATORY_TRACT | Status: DC | PRN

## 2014-01-26 MED ORDER — DM-GUAIFENESIN ER 30-600 MG PO TB12
1.0000 | ORAL_TABLET | Freq: Two times a day (BID) | ORAL | Status: DC
  Administered 2014-01-26 – 2014-01-28 (×6): 1 via ORAL
  Filled 2014-01-26 (×11): qty 1

## 2014-01-26 MED ORDER — SODIUM CHLORIDE 0.9 % IJ SOLN
3.0000 mL | Freq: Two times a day (BID) | INTRAMUSCULAR | Status: DC
  Administered 2014-01-26 – 2014-01-30 (×6): 3 mL via INTRAVENOUS

## 2014-01-26 MED ORDER — TIOTROPIUM BROMIDE MONOHYDRATE 18 MCG IN CAPS: 18.0000 ug | ORAL_CAPSULE | Freq: Every day | RESPIRATORY_TRACT | Status: DC

## 2014-01-26 NOTE — Progress Notes (Signed)
Inpatient Diabetes Program Recommendations  AACE/ADA: New Consensus Statement on Inpatient Glycemic Control (2013)  Target Ranges:  Prepandial:   less than 140 mg/dL      Peak postprandial:   less than 180 mg/dL (1-2 hours)      Critically ill patients:  140 - 180 mg/dL  Results for [name], [name] (MRN [id number]) as of 01/26/2014 09:22  Ref. Range 05/22/2010 04:30  Hemoglobin A1C Latest Range: <5.7 % 5.6.Marland KitchenMarland Kitchen   Results for [name], [name] (MRN [id number]) as of 01/26/2014 09:22  Ref. Range 01/25/2014 21:45 01/26/2014 03:34  Glucose Latest Range: 70-99 mg/dL 130 (H) 197 (H)    Diabetes history: No Outpatient Diabetes medications: NA Current orders for Inpatient glycemic control: NONE  Inpatient Diabetes Program Recommendations Correction (SSI): While inpatient and on steroids, please consider ordering CBGs with Novolog correction scale ACHS. HgbA1C: Please order an A1C to evaluate glycemic control over the past 2-3 months.  Thanks, Barnie Alderman, RN, MSN, CCRN Diabetes Coordinator Inpatient Diabetes Program [phone] (Team Pager) [phone] (AP office) [phone] [facility] office)

## 2014-01-26 NOTE — Progress Notes (Signed)
ANTIBIOTIC CONSULT NOTE - INITIAL  Pharmacy Consult for Vancomycin and Zosyn  Indication: pneumonia  Allergies  Allergen Reactions  . Escitalopram Oxalate Shortness Of Breath, Diarrhea and Nausea And Vomiting    Hot flashes, dizziness  . Indomethacin Other (See Comments)    "made me very very dizzy and made me have vertigo" (08/31/2012)  . Nsaids     Jerking, hot flashes, nausea  . Codeine Nausea And Vomiting    "I can take codeine in my cough medicine" (08/31/2012)  . Sulfonamide Derivatives Other (See Comments)    Drug induced hepatitis in 1989    Patient Measurements: Height: 5' 4.57" (164 cm) Weight: 167 lb 1.7 oz (75.8 kg) IBW/kg (Calculated) : 56 Adjusted Body Weight:   Vital Signs: Temp: 100.6 F (38.1 C) (04/08 2219) Temp src: Rectal (04/08 2219) BP: 124/40 mmHg (04/09 0115) Pulse Rate: 121 (04/09 0115) Intake/Output from previous day:   Intake/Output from this shift:    Labs:  Recent Labs  01/25/14 2145  WBC 9.1  HGB 12.2  PLT 171  CREATININE 0.86   Estimated Creatinine Clearance: 59.6 ml/min (by C-G formula based on Cr of 0.86). No results found for this basename: VANCOTROUGH, VANCOPEAK, VANCORANDOM, GENTTROUGH, GENTPEAK, GENTRANDOM, TOBRATROUGH, TOBRAPEAK, TOBRARND, AMIKACINPEAK, AMIKACINTROU, AMIKACIN,  in the last 72 hours   Microbiology: No results found for this or any previous visit (from the past 720 hour(s)).  Medical History: Past Medical History  Diagnosis Date  . Other emphysema   . Unspecified arthropathy, shoulder region   . Gastric ulcer, unspecified as acute or chronic, without mention of hemorrhage, perforation, or obstruction   . Benign paroxysmal positional vertigo   . COPD (chronic obstructive pulmonary disease)   . Diverticula of colon   . Osteopenia   . Personal history of colonic polyps 2007    tubular adenoma high grade dysplasia  . Diverticulitis   . Hyperlipemia   . Fracture of ankle, bimalleolar, right, closed   .  Hypoxemia   . C. difficile diarrhea   . Varicose vein   . Asthma   . GERD (gastroesophageal reflux disease)   . Hepatitis 1989  . Arthritis     "both shoulders"   . TIA (transient ischemic attack) 04/2008; 08/21/2012    "slight droop left lower lip after 08/21/12 TIA"  . On home oxygen therapy   . Ischemic cardiomyopathy     a. EF 45-50% 2011. b. EF 35-40% by cath 09/01/12.  . Night terrors, adult     dr Leonie Man referred  for PSG in 2011, and again in 2013   . Breast cancer 04/08/13 bx    left  . Allergy   . CAD (coronary artery disease)     a. ant MI 40 tx with TPA/PTCA. b. Prior stenting procedures including RCA stent, DES to LCx 04/2011. c. [place] -> DES  to prox LAD 08/2012.   Marland Kitchen Hypertension     TAKES FOR H/O STROKE  . CVA (cerebral vascular accident)   . Stroke, acute, embolic     Nov 8676 , [place]  [facility] , was treated with TPA.   . 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 Path G1 IDC, 1.6 cm, neg margin, neg SLN     Medications:  Anti-infectives   Start     Dose/Rate Route Frequency Ordered Stop   01/26/14 1000  vancomycin (VANCOCIN)  IVPB 750 mg/150 ml premix     750 mg 150 mL/hr over 60 Minutes Intravenous  Once 01/26/14 0155     01/26/14 0600  piperacillin-tazobactam (ZOSYN) IVPB 3.375 g     3.375 g 12.5 mL/hr over 240 Minutes Intravenous 3 times per day 01/26/14 0155     01/25/14 2230  vancomycin (VANCOCIN) IVPB 1000 mg/200 mL premix     1,000 mg 200 mL/hr over 60 Minutes Intravenous  Once 01/25/14 2220 01/26/14 0033   01/25/14 2230  piperacillin-tazobactam (ZOSYN) IVPB 3.375 g     3.375 g 100 mL/hr over 30 Minutes Intravenous  Once 01/25/14 2220 01/26/14 0058     Assessment: Patient with PNA.  First dose of antibiotics already given   Goal of Therapy:  Vancomycin trough level 15-20 mcg/ml Zosyn based on renal function   Plan:  Measure  antibiotic drug levels at steady state Follow up culture results Vancomycin 742m iv q12hr Zosyn 3.375g IV Q8H infused over 4hrs.   Alecxander Mainwaring [employer] 01/26/2014,1:55 AM

## 2014-01-26 NOTE — Progress Notes (Signed)
PT Cancellation Note  Patient Details Name: ZYKERIAH MATHIA MRN: [id number] DOB: 10/07/1941   Cancelled Treatment:    Reason Eval/Treat Not Completed: pt declined to participate at this time - "I can't breathe. I'll do PT once I can breathe." Encouraged pt to keep RN aware of her needs.    Weston Anna, MPT Pager: [phone]

## 2014-01-26 NOTE — H&P (Signed)
PCP:   Unice Cobble, MD   Chief Complaint:  sob  HPI: 73 yo female with h/o copd on chronic oxygen at home, just d/c from rehab after a hospitalization in jan requiring intubation comes in with several days of worsening sob and cough.  Pt was weaned off of her steroids, was on 31m pred daily up until Sunday since January.  Denies fevers.  No n/v/d.  No swelling.  Exsmoker.  She called her daughter b/c she was sob, dtr is RN in ED and noted her moms significant sob over the phone, so called 911.  Pt has received iv solumedrol and hour long neb in ED and feels better but still sob.  She just got home from rehab about 5 days ago.  She has been coughing a lot, was on mucinex at the rehab center but this was stopped last week and her secretions have worsened since.  No swelling in her legs.  Review of Systems:  Positive and negative as per HPI otherwise all other systems are negative  Past Medical History: Past Medical History  Diagnosis Date  . Other emphysema   . Unspecified arthropathy, shoulder region   . Gastric ulcer, unspecified as acute or chronic, without mention of hemorrhage, perforation, or obstruction   . Benign paroxysmal positional vertigo   . COPD (chronic obstructive pulmonary disease)   . Diverticula of colon   . Osteopenia   . Personal history of colonic polyps 2007    tubular adenoma high grade dysplasia  . Diverticulitis   . Hyperlipemia   . Fracture of ankle, bimalleolar, right, closed   . Hypoxemia   . C. difficile diarrhea   . Varicose vein   . Asthma   . GERD (gastroesophageal reflux disease)   . Hepatitis 1989  . Arthritis     "both shoulders"   . TIA (transient ischemic attack) 04/2008; 08/21/2012    "slight droop left lower lip after 08/21/12 TIA"  . On home oxygen therapy   . Ischemic cardiomyopathy     a. EF 45-50% 2011. b. EF 35-40% by cath 09/01/12.  . Night terrors, adult     dr sLeonie Manreferred  for PSG in 2011, and again in 2013   . Breast  cancer 04/08/13 bx    left  . Allergy   . CAD (coronary artery disease)     a. ant MI 122tx with TPA/PTCA. b. Prior stenting procedures including RCA stent, DES to LCx 04/2011. c. [place]-> DES  to prox LAD 08/2012.   .Marland KitchenHypertension     TAKES FOR H/O STROKE  . CVA (cerebral vascular accident)   . Stroke, acute, embolic     Nov [address], [facility] , was treated with TPA.   . 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 Path G1 IDC, 1.6 cm, neg margin, neg SLN    Past Surgical History  Procedure Laterality Date  . Total shoulder arthroplasty  2012    left  . Orif ankle fracture  2007    right  . Martial megeti  16606'T   "bladder operation; not successful " (08/31/2012)  . Varicose vein surgery  1983; 1985    "both legs both times" (08/31/2012)  . Total shoulder replacement  12/05/2010    Dr SOnnie Graham . Abdominal hysterectomy      TAH w/BSO  .  Augmentation mammaplasty  1975  . Coronary angioplasty with stent placement  2010  . Coronary angioplasty  1990    "3 times" (08/31/2012)  . Colon surgery  07/07/2011    Dr. Hassell Done  . Appendectomy  1960  . Nose surgery      1977  . Breast augmenttation    . Catarct  right      RIGHT EYE  . Breast lumpectomy with needle localization and axillary sentinel lymph node bx Left 06/02/2013    Procedure: BREAST LUMPECTOMY WITH NEEDLE LOCALIZATION AND AXILLARY SENTINEL LYMPH NODE BX;  Surgeon: Haywood Lasso, MD;  Location: [facility];  Service: General;  Laterality: Left;  NEEDLE LOC BCG 7:30 NUC MED 9:30 RECONSTRUCTION TO FOLLOW 1 HOUR added   . Breast implant removal Left 06/02/2013    Procedure: REMOVAL BREAST IMPLANTS; LEFT BREAST CAPSULECTOMY WITH PLACEMENT OF TISSUE EXPANDER AND USE OF FLEX HD;  Surgeon: Crissie Reese, MD;  Location: [place];  Service: Plastics;  Laterality: Left;    Medications: Prior to Admission  medications   Medication Sig Start Date End Date Taking? Authorizing Provider  albuterol (PROVENTIL HFA;VENTOLIN HFA) 108 (90 BASE) MCG/ACT inhaler Inhale 1 puff into the lungs every 6 (six) hours as needed for wheezing or shortness of breath.   Yes Historical Provider, MD  albuterol (PROVENTIL) (2.5 MG/3ML) 0.083% nebulizer solution Take 2.5 mg by nebulization every 6 (six) hours as needed for wheezing or shortness of breath.   Yes Historical Provider, MD  anastrozole (ARIMIDEX) 1 MG tablet Take 1 mg by mouth daily.   Yes Historical Provider, MD  aspirin EC 81 MG tablet Take 81 mg by mouth daily.   Yes Historical Provider, MD  B Complex Vitamins (B-COMPLEX/B-12 PO) Take 1 tablet by mouth daily.   Yes Historical Provider, MD  busPIRone (BUSPAR) 15 MG tablet Take 15 mg by mouth 2 (two) times daily.   Yes Historical Provider, MD  Calcium Carb-Cholecalciferol (CALCIUM 600 + D PO) Take 0.5 tablets by mouth 2 (two) times daily.   Yes Historical Provider, MD  Cholecalciferol (VITAMIN D) 2000 UNITS CAPS Take 1 capsule by mouth 2 (two) times daily.   Yes Historical Provider, MD  clonazePAM (KLONOPIN) 0.5 MG tablet 2 (two) times daily. Take 1/4 tablet by mouth every morning for anxiety 12/20/13  Yes Estill Dooms, MD  clopidogrel (PLAVIX) 75 MG tablet Take 1 tablet (75 mg total) by mouth daily. 01/20/14  Yes Burnell Blanks, MD  CRANBERRY PO Take 1 tablet by mouth daily.   Yes Historical Provider, MD  Fluticasone-Salmeterol (ADVAIR) 250-50 MCG/DOSE AEPB Inhale 1 puff into the lungs 2 (two) times daily.   Yes Historical Provider, MD  gabapentin (NEURONTIN) 300 MG capsule Take 300 mg by mouth at bedtime.   Yes Historical Provider, MD  metoprolol tartrate (LOPRESSOR) 25 MG tablet Take 12.5 mg by mouth daily.   Yes Historical Provider, MD  Multiple Vitamin (MULTIVITAMIN WITH MINERALS) TABS tablet Take 0.5 tablets by mouth 2 (two) times daily.   Yes Historical Provider, MD  Omega-3 Fatty Acids (FISH OIL) 1200  MG CAPS Take 1 capsule by mouth daily.   Yes Historical Provider, MD  oxybutynin (DITROPAN) 5 MG tablet Take 5 mg by mouth at bedtime.    Yes Historical Provider, MD  pantoprazole (PROTONIX) 40 MG tablet Take 1 tablet (40 mg total) by mouth 2 (two) times daily. 12/23/13  Yes Burnell Blanks, MD  potassium chloride SA (K-DUR,KLOR-CON) 20 MEQ tablet Take 20  mEq by mouth daily.   Yes Historical Provider, MD  Pyridoxine HCl (VITAMIN B-6 PO) Take 1 tablet by mouth daily.   Yes Historical Provider, MD  simvastatin (ZOCOR) 40 MG tablet Take 40 mg by mouth daily.   Yes Historical Provider, MD  tiotropium (SPIRIVA) 18 MCG inhalation capsule Place 18 mcg into inhaler and inhale daily.   Yes Historical Provider, MD  traMADol (ULTRAM) 50 MG tablet Take 75 mg by mouth every 6 (six) hours as needed for moderate pain.   Yes Historical Provider, MD  furosemide (LASIX) 20 MG tablet Take 1 tablet (20 mg total) by mouth daily. 11/26/13   Cecilie Kicks, NP  HYDROcodone-homatropine [facility]) 5-1.5 MG/5ML syrup Take 5 mLs by mouth 2 (two) times daily as needed for cough. 01/25/14   Rigoberto Noel, MD  nystatin (MYCOSTATIN) 100000 UNIT/ML suspension Take 5 mLs (500,000 Units total) by mouth 2 (two) times daily. 01/23/14   Rigoberto Noel, MD    Allergies:   Allergies  Allergen Reactions  . Escitalopram Oxalate Shortness Of Breath, Diarrhea and Nausea And Vomiting    Hot flashes, dizziness  . Indomethacin Other (See Comments)    "made me very very dizzy and made me have vertigo" (08/31/2012)  . Nsaids     Jerking, hot flashes, nausea  . Codeine Nausea And Vomiting    "I can take codeine in my cough medicine" (08/31/2012)  . Sulfonamide Derivatives Other (See Comments)    Drug induced hepatitis in 1989    Social History:  reports that she quit smoking about 16 years ago. Her smoking use included Cigarettes. She has a 40 pack-year smoking history. She has never used smokeless tobacco. She reports that she drinks  about 1.8 ounces of alcohol per week. She reports that she does not use illicit drugs.  Family History: Family History  Problem Relation Age of Onset  . Breast cancer Sister     x 2  . Colon cancer Neg Hx   . Diabetes Paternal Grandmother   . Uterine cancer Paternal Grandmother   . Lung cancer Paternal Grandmother   . Heart disease Mother   . Alcohol abuse Mother   . Heart disease Father   . Pancreatic cancer Cousin   . Colon polyps Sister   . Diabetes Maternal Uncle   . Heart disease Maternal Grandfather   . Heart disease Paternal Grandfather   . Stroke Paternal Grandfather     Physical Exam: Filed Vitals:   01/25/14 2146 01/25/14 2157 01/25/14 2219 01/25/14 2231  BP:      Pulse:      Temp:  99 F (37.2 C) 100.6 F (38.1 C)   TempSrc:  Axillary Rectal   Resp:      SpO2: 95%   93%   General appearance: alert, cooperative and mild distress Head: Normocephalic, without obvious abnormality, atraumatic Eyes: negative Nose: Nares normal. Septum midline. Mucosa normal. No drainage or sinus tenderness. Neck: no JVD and supple, symmetrical, trachea midline Lungs: diminished breath sounds bilaterally and wheezes bibasilar Heart: regular rate and rhythm, S1, S2 normal, no murmur, click, rub or gallop Abdomen: soft, non-tender; bowel sounds normal; no masses,  no organomegaly Extremities: extremities normal, atraumatic, no cyanosis or edema Pulses: 2+ and symmetric Skin: Skin color, texture, turgor normal. No rashes or lesions Neurologic: Grossly normal    Labs on Admission:   Recent Labs  01/25/14 2145  NA 141  K 4.0  CL 103  CO2 24  GLUCOSE 130*  BUN 15  CREATININE 0.86  CALCIUM 9.0    Recent Labs  01/25/14 2145  WBC 9.1  HGB 12.2  HCT 38.9  MCV 86.4  PLT 171   Radiological Exams on Admission: Dg Chest Port 1 View  01/25/2014   CLINICAL DATA:  Shortness of breath  EXAM: PORTABLE CHEST - 1 VIEW  COMPARISON:  01/23/2014  FINDINGS: Chronic pulmonary  hyperinflation and interstitial coarsening with apical emphysematous change. No evidence of superimposed pneumonia, edema, effusion, or pneumothorax. Normal heart size and mediastinal contours. Left glenohumeral total arthroplasty. No acute osseous findings.  IMPRESSION: Emphysema without superimposed pneumonia or edema.   Electronically Signed   By: Jorje Guild M.D.   On: 01/25/2014 22:54    Assessment/Plan  73 yo female with acute on chronic respiratory hypoxia failure due to copd exacerbation  Principal Problem:   Acute on chronic respiratory failure with hypoxia-  Secondary mostly to copde, will give iv solumedrol.  freq nebs.  Oxygen.  Proceed to bipap if needed, but appears she has had some improvement since arrival.  She may not be able to tolerate being off of prednisone, or may need a longer slower wean.  With low grade temp, will cover with broad abx for now.     Active Problems:   CORONARY ARTERY DISEASE, multiple PCIs in the past, cath 11/25/13 with non obstructive disease continue medical therapy   CARDIOMYOPATHY, ISCHEMIC   HYPERTENSION, BENIGN   COPD exacerbation   Anxiety  Pt has many concerns about her previous experience while being intubated for 5 days in jan.  She remembers much of it, and is horrified that this will happen again.  She was apparently hard to get sedated at that time, and diprovan ended up working well and finally sedating her enough while she was intubated.  She wishes to be FULL CODE but obviously does not want this to happen again.  So please, if pt needs intubation, consider going straight to diprovan gtt.    Sharryn Belding A Shanon Brow 01/26/2014, 12:11 AM

## 2014-01-26 NOTE — Progress Notes (Signed)
Patient seen and examined, database reviewed. Admitted earlier today for acute on chronic respiratory failure, believed 2/2 COPD. In Jan had a prolonged hospitalization due to PNA and Influenza A requiring ventilatory support and ultimately culminating in a rehab admission. She had been on steroids ever since and these were discontinued on Sunday (4 days PTA) and then she developed recurrent SOB. Suspect she will need a prolonged steroid taper on DC. No wheezing today. Will start to taper solumedrol, transfer to floor, PT/OT evals. No convincing evidence for PNA. Will DC vanc/zosyn and place on levaquin for 5 days total. Will continue to follow.  Domingo Mend, MD [facility] Pager: [phone]

## 2014-01-26 NOTE — Progress Notes (Signed)
CARE MANAGEMENT NOTE 01/26/2014  Patient:  WILDER, AMODEI   Account Number:  [id number]  Date Initiated:  01/26/2014  Documentation initiated by:  DAVIS,RHONDA  Subjective/Objective Assessment:   pt with hx of copd  now with excerbation.  abnormal abg's     Action/Plan:   recent discharge-[id number] from [facility] for resp rehab.   Anticipated DC Date:  01/29/2014   Anticipated DC Plan:  SKILLED NURSING FACILITY  In-house referral  Clinical Social Worker      DC Planning Services  NA      [facility] Choice  NA   Choice offered to / List presented to:  NA   DME arranged  NA      DME agency  NA     [place] arranged  NA      [place] agency  NA   Status of service:  In process, will continue to follow Medicare Important Message given?  NA - LOS <3 / Initial given by admissions (If response is "NO", the following Medicare IM given date fields will be blank) Date Medicare IM given:   Date Additional Medicare IM given:    Discharge Disposition:    Per UR Regulation:  Reviewed for med. necessity/level of care/duration of stay  If discussed at [place] of Stay Meetings, dates discussed:    Comments:  [id number]/Rhonda Eldridge Dace, [place], [place] [phone] Chart Reviewed for discharge and hospital needs. Discharge needs at time of review: None present will follow for needs. Review of patient progress due on [id number].

## 2014-01-26 NOTE — Progress Notes (Signed)
Pt is now resting and her anxiety is greatly reduced, breathing is much improved.

## 2014-01-26 NOTE — Progress Notes (Signed)
COURTESY NOTE:  Appreciate being made aware of this patient's admission for COPD related problems. Her breast cancer history is summarized below. There is no evidence of breast cancer activity. She should remain on anastrozole so long as she can take po's easily-- no effect on pulmonary issues.   73 y.o. [place] woman is status post left breast biopsy 04/08/2013 for a clinical T1c N0, stage IA invasive ductal carcinoma, grade 1, estrogen and progesterone receptor positive, with no HER-2 amplification, and an MIB-1 of 7%  (1) status post left lumpectomy and sentinel lymph node sampling age 35 2014 for a pT1c pN0, stage IA invasive ductal carcinoma, grade 1, with repeat HER-2 again negative  (2) Oncotype score of 6 predicts a distant disease recurrent rate of 5% within 10 years if the patient's only systemic treatment is tamoxifen for 5 years.  (3) started anastrozole August 2014  Thank you!

## 2014-01-26 NOTE — Progress Notes (Signed)
Patients anxiety level remains low and is resting, work of breathing remains normal.

## 2014-01-27 LAB — BASIC METABOLIC PANEL
BUN: 19 mg/dL (ref 6–23)
CHLORIDE: 106 meq/L (ref 96–112)
CO2: 23 mEq/L (ref 19–32)
Calcium: 9.3 mg/dL (ref 8.4–10.5)
Creatinine, Ser: 0.69 mg/dL (ref 0.50–1.10)
GFR calc non Af Amer: 85 mL/min — ABNORMAL LOW (ref >90)
GLUCOSE: 173 mg/dL — AB (ref 70–99)
Potassium: 4.9 mEq/L (ref 3.7–5.3)
Sodium: 142 mEq/L (ref 137–147)

## 2014-01-27 LAB — CBC
HCT: 39.9 % (ref 36.0–46.0)
HEMOGLOBIN: 12.8 g/dL (ref 12.0–15.0)
MCH: 27.6 pg (ref 26.0–34.0)
MCHC: 32.1 g/dL (ref 30.0–36.0)
MCV: 86 fL (ref 78.0–100.0)
Platelets: 157 10*3/uL (ref 150–400)
RBC: 4.64 MIL/uL (ref 3.87–5.11)
RDW: 15.1 % (ref 11.5–15.5)
WBC: 13.4 10*3/uL — ABNORMAL HIGH (ref 4.0–10.5)

## 2014-01-27 MED ORDER — ALBUTEROL (5 MG/ML) CONTINUOUS INHALATION SOLN
10.0000 mg/h | INHALATION_SOLUTION | RESPIRATORY_TRACT | Status: AC
  Administered 2014-01-27: 10 mg/h via RESPIRATORY_TRACT
  Filled 2014-01-27: qty 20

## 2014-01-27 MED ORDER — METOPROLOL TARTRATE 12.5 MG HALF TABLET
12.5000 mg | ORAL_TABLET | Freq: Every day | ORAL | Status: DC
  Administered 2014-01-27 – 2014-02-05 (×9): 12.5 mg via ORAL
  Filled 2014-01-27 (×12): qty 1

## 2014-01-27 MED ORDER — LORAZEPAM 0.5 MG PO TABS: 0.2500 mg | ORAL_TABLET | Freq: Two times a day (BID) | ORAL | Status: DC | PRN

## 2014-01-27 MED ORDER — POLYETHYLENE GLYCOL 3350 17 G PO PACK
17.0000 g | PACK | Freq: Every day | ORAL | Status: DC
  Administered 2014-01-27 – 2014-02-06 (×9): 17 g via ORAL
  Filled 2014-01-27 (×12): qty 1

## 2014-01-27 MED ORDER — ASPIRIN EC 81 MG PO TBEC
81.0000 mg | DELAYED_RELEASE_TABLET | Freq: Every day | ORAL | Status: DC
  Filled 2014-01-27: qty 1

## 2014-01-27 MED ORDER — LORAZEPAM 2 MG/ML IJ SOLN
1.0000 mg | Freq: Once | INTRAMUSCULAR | Status: AC
  Administered 2014-01-27: 1 mg via INTRAVENOUS
  Filled 2014-01-27: qty 1

## 2014-01-27 MED ORDER — LORAZEPAM 2 MG/ML IJ SOLN
0.2500 mg | Freq: Two times a day (BID) | INTRAMUSCULAR | Status: DC | PRN
  Administered 2014-01-27: 0.25 mg via INTRAVENOUS
  Filled 2014-01-27 (×2): qty 1

## 2014-01-27 MED ORDER — METHYLPREDNISOLONE SODIUM SUCC 125 MG IJ SOLR
80.0000 mg | INTRAMUSCULAR | Status: DC
  Filled 2014-01-27 (×2): qty 1.28

## 2014-01-27 MED ORDER — ANASTROZOLE 1 MG PO TABS
1.0000 mg | ORAL_TABLET | Freq: Every day | ORAL | Status: DC
  Administered 2014-01-28 – 2014-02-05 (×8): 1 mg via ORAL
  Filled 2014-01-27 (×11): qty 1

## 2014-01-27 MED ORDER — DOCUSATE SODIUM 100 MG PO CAPS
100.0000 mg | ORAL_CAPSULE | Freq: Every day | ORAL | Status: DC
  Administered 2014-01-27: 100 mg via ORAL
  Filled 2014-01-27 (×3): qty 1

## 2014-01-27 MED ORDER — LORAZEPAM 2 MG/ML IJ SOLN
0.5000 mg | Freq: Once | INTRAMUSCULAR | Status: AC
  Administered 2014-01-27: 0.5 mg via INTRAVENOUS
  Filled 2014-01-27: qty 1

## 2014-01-27 NOTE — Evaluation (Signed)
Occupational Therapy Evaluation Patient Details Name: [name] MRN: [id number] DOB: 11/20/1940 Today's Date: 01/27/2014    History of Present Illness Pt is a 73 yo female admitted with SOB and cough after being recently d/c'd from [facility] 5 days ago w respiratory issues.  Pt with h/o COPD and is on chronic O2.     Clinical Impression   Pt admitted with the above diagnosis and has the deficits listed below.  Pt would benefit from cont OT to increase I and efficiency with all adls so she hopefully lives safely alone at home.    Follow Up Recommendations  Home health OT;Supervision - Intermittent    Equipment Recommendations  None recommended by OT    Recommendations for Other Services       Precautions / Restrictions Precautions Precautions: Fall Restrictions Weight Bearing Restrictions: No      Mobility Bed Mobility Overal bed mobility: Needs Assistance Bed Mobility: Supine to Sit     Supine to sit: Supervision     General bed mobility comments: no hands on assist needed  Transfers Overall transfer level: Needs assistance Equipment used: 1 person hand held assist Transfers: Sit to/from Stand;Stand Pivot Transfers Sit to Stand: Supervision Stand pivot transfers: Min guard       General transfer comment: overall did not lose balance but was impulsive and very SOB    Balance Overall balance assessment: Needs assistance Sitting-balance support: Bilateral upper extremity supported;Feet supported Sitting balance-Leahy Scale: Good     Standing balance support: Bilateral upper extremity supported;During functional activity Standing balance-Leahy Scale: Good                              ADL Overall ADL's : Needs assistance/impaired Eating/Feeding: Independent   Grooming: Wash/dry face;Wash/dry hands;Set up;Sitting Grooming Details (indicate cue type and reason): pt did all adls sitting due to SOB. Upper Body Bathing: Set up;Sitting    Lower Body Bathing: Supervison/ safety;Sit to/from stand Lower Body Bathing Details (indicate cue type and reason): Was safe in standing when washing but moves quickly and tends to be impulsive  b/c she gets so SOB she wants to sit. Upper Body Dressing : Set up;Sitting   Lower Body Dressing: Min guard;Sit to/from stand Lower Body Dressing Details (indicate cue type and reason): min guard when pulling pants up. Toilet Transfer: Loss adjuster, chartered and Hygiene: Supervision/safety;Sit to/from stand       Functional mobility during ADLs: Min guard General ADL Comments: Pt overall does well with adls.  Pt has dyspnea level of 4 during any activity which makes being efficient in adls very difficulty.  Need to review all energy conservation techniques before returning  home.     Vision                     Perception     Praxis      Pertinent Vitals/Pain O2 sat stayed at 100% on 3L of O2.  HR 107.  Dyspnea level 4 w activity     Hand Dominance Right   Extremity/Trunk Assessment Upper Extremity Assessment Upper Extremity Assessment: RUE deficits/detail RUE Deficits / Details: shoulder ROM limited to 120 degrees flexion.  Is supposed to get a shoulder replacement but then respiratory issues ensued.   Lower Extremity Assessment Lower Extremity Assessment: Defer to PT evaluation   Cervical / Trunk Assessment Cervical / Trunk Assessment: Normal   Communication Communication Communication:  No difficulties   Cognition Arousal/Alertness: Awake/alert Behavior During Therapy: Anxious Overall Cognitive Status: Within Functional Limits for tasks assessed                     General Comments       Exercises       Shoulder Instructions      Home Living Family/patient expects to be discharged to:: Private residence Living Arrangements: Alone Available Help at Discharge: Friend(s);Available PRN/intermittently Type of Home: House Home  Access: Stairs to enter [employer] of Steps: 5 Entrance Stairs-Rails: Can reach both;Left;Right Home Layout: One level     Bathroom Shower/Tub: Walk-in shower;Door   [employer] Toilet: Standard Bathroom Accessibility: Yes How Accessible: Accessible via walker Home Equipment: Walker - 2 wheels;Cane - single point;Bedside commode;Shower seat;Other (comment) (got walker but has to return b/c it is a bariatric walker)          Prior Functioning/Environment Level of Independence: Independent with assistive device(s)             OT Diagnosis: Generalized weakness   OT Problem List: Decreased activity tolerance;Decreased knowledge of use of DME or AE;Impaired UE functional use;Pain   OT Treatment/Interventions: Self-care/ADL training;DME and/or AE instruction;Energy conservation    OT Goals(Current goals can be found in the care plan section) Acute Rehab OT Goals Patient Stated Goal: to be able to breathe. OT Goal Formulation: With patient Time For Goal Achievement: 02/03/14 Potential to Achieve Goals: Good ADL Goals Pt Will Perform Grooming: with modified independence;standing Additional ADL Goal #1: Pt will gather all clothes with cane or walker and fully dress with mod I using proper energy conservation techniques. Additional ADL Goal #2: Pt will toilet with 3:1 over commode with mod I. Additional ADL Goal #3: Pt will state 4 energy conservation techniques she will implement upon returning home to assist with adl efficiency.  OT Frequency: Min 2X/week   Barriers to D/C: Decreased caregiver support  not sure how much friends and family are available but she might need a bit more than PRN assist.       Co-evaluation              End of Session Equipment Utilized During Treatment: Rolling walker;Oxygen Nurse Communication: Mobility status;Patient requests pain meds  Activity Tolerance: Other (comment) (Limited by SOB) Patient left: in chair;with call  bell/phone within reach;with chair alarm set   Time: [phone] OT Time Calculation (min): 33 min Charges:  OT General Charges $OT Visit: 1 Procedure OT Evaluation $Initial OT Evaluation Tier I: 1 Procedure OT Treatments $Self Care/Home Management : 23-37 mins G-Codes:    Vickki Muff 02-23-2014, 9:23 AM [phone]

## 2014-01-27 NOTE — Progress Notes (Addendum)
Clinical Social Work Department BRIEF PSYCHOSOCIAL ASSESSMENT Late Entry 01/27/2014  Patient:  MAGALI, BRAY     Account Number:  [id number]     Admit date:  01/25/2014  Clinical Social Worker:  Ulyess Blossom  Date/Time:  01/26/2014 03:30 PM  Referred by:  RN  Date Referred:  01/26/2014 Referred for  Other - See comment   Other Referral:   other psychosocial needs   Interview type:  Family Other interview type:    PSYCHOSOCIAL DATA Living Status:  ALONE Admitted from facility:   Level of care:   Primary support name:  Caryl Pina Lassiter/daughter/[phone] Primary support relationship to patient:  CHILD, ADULT Degree of support available:   strong    CURRENT CONCERNS Current Concerns  Other - See comment   Other Concerns:   CSW received phone call from RN stating that pt daughters requesting to speak with [place] / PLAN CSW received phone call from RN stating that pt daughters were upset and wishing to speak with CSW.    CSW met with pt two daughters in ICU waiting room. CSW introduced self and explained role. Pt daughters discussed that pt recently had a lengthy hospitalization from which she discharged to [facility] and then to [facility] and then just returned home approximately a week ago. Pt daughters were upset about how pt discharged from [facility] was handled and do not feel that pt received the appropriate equipment at home. Pt daughter discussed that the walker that pt received following her stay at [facility] is very large and bulky and pt has a very difficult time breaking down the walker in order to place it in her care. Pt daughters also expressed concern about pt oxygen. Pt daughters discussed that pt wanted a portable oxygen in order to be mobile and go to [place] to visit her daughter, but was told that she could not have one due to Medicare guidelines and has oxygen at home connected to a 50 foot line and when EMS  came to pt home the line was all wrapped around pt. Pt daughters are very eager to find solutions to these issues as they are greatly impacting pt quality of life. CSW provided emotional support throughout conversation. CSW discussed with pt daughters that RNCM is the person that will be able to specifically answer the questions regarding insurance coverage and assisting in making some changes to try to assist in alleviating these concerns and improving pt quality of life.    CSW notified RNCM who met with this CSW and pt daughters and will be assisting with pt home needs.    CSW signing off at this time as RNCM will continue to assist with pt home needs.   Assessment/plan status:  Psychosocial Support/Ongoing Assessment of Needs Other assessment/ plan:   Information/referral to community resources:   Referral to [facility] needs    PATIENT'S/FAMILY'S RESPONSE TO PLAN OF CARE: Pt alert and oriented x 4. CSW spoke to pt following meeting with daughters to provide support and notify pt that RNCM was looking into options to address concerns. Pt and pt daughters were very appreciative of CSW visit and support and appreciated CSW advocacy in connecting family to appropriate resource to assist in addressing concerns.    Alison Murray, MSW, [place] [facility] [phone]

## 2014-01-27 NOTE — Evaluation (Signed)
Physical Therapy Evaluation Patient Details Name: [name] MRN: [id number] DOB: 19-Feb-1941 Today's Date: 01/27/2014   History of Present Illness  Pt is a 73 yo female admitted with SOB and cough after being recently d/c'd from [facility] 5 days ago w respiratory issues.  Pt with h/o COPD and is on chronic O2.    Clinical Impression  Pt tolerated ambulation in hall. Very dyspneic but pushes self. Pt will benefit from PT to address problems listed below. Pt plans DC home w/ HHPT as PTA.    Follow Up Recommendations Home health PT;Supervision/Assistance - 24 hour    Equipment Recommendations  None recommended by PT    Recommendations for Other Services       Precautions / Restrictions Precautions Precautions: Fall Precaution Comments: monitor sats      Mobility  Bed Mobility Overal bed mobility: Needs Assistance Bed Mobility: Supine to Sit;Sit to Supine     Supine to sit: Supervision Sit to supine: Supervision;HOB elevated   General bed mobility comments: no hands on assist needed  Transfers Overall transfer level: Needs assistance Equipment used: Rolling walker (2 wheeled) Transfers: Sit to/from Stand Sit to Stand: Supervision         General transfer comment: overall did not lose balance but was impulsive and very SOB  Ambulation/Gait Ambulation/Gait assistance: Min guard Ambulation Distance (Feet): 100 Feet (120') Assistive device: Rolling walker (2 wheeled)   Gait velocity: slow,    General Gait Details: Extra time for breathing.  Stairs            Wheelchair Mobility    Modified Rankin (Stroke Patients Only)       Balance           Standing balance support: Bilateral upper extremity supported Standing balance-Leahy Scale: Good                               Pertinent Vitals/Pain Pre 96% 3 /  during 93% 3 l. 4/4 dyspnea    Home Living Family/patient expects to be discharged to:: Private residence Living  Arrangements: Alone Available Help at Discharge: Friend(s);Available PRN/intermittently Type of Home: House Home Access: Stairs to enter Entrance Stairs-Rails: Can reach both;Left;Right   Home Layout: One level Home Equipment: Walker - 2 wheels;Cane - single point;Bedside commode;Shower seat;Other (comment);Walker - 4 wheels      Prior Function Level of Independence: Independent with assistive device(s)               Hand Dominance        Extremity/Trunk Assessment   Upper Extremity Assessment: Defer to OT evaluation           Lower Extremity Assessment: Generalized weakness         Communication   Communication: No difficulties (difficulty when Dyspnea)  Cognition Arousal/Alertness: Awake/alert Behavior During Therapy: Anxious Overall Cognitive Status: Within Functional Limits for tasks assessed                      General Comments      Exercises        Assessment/Plan    PT Assessment Patient needs continued PT services  PT Diagnosis Difficulty walking;Generalized weakness   PT Problem List Decreased strength;Decreased activity tolerance;Decreased mobility;Cardiopulmonary status limiting activity  PT Treatment Interventions DME instruction;Gait training;Stair training;Functional mobility training;Therapeutic activities;Therapeutic exercise;Patient/family education   PT Goals (Current goals can be found in the Care Plan section) Acute  Rehab PT Goals Patient Stated Goal: to be able to breathe. PT Goal Formulation: With patient Time For Goal Achievement: 02/10/14 Potential to Achieve Goals: Good    Frequency Min 3X/week   Barriers to discharge        Co-evaluation               End of Session   Activity Tolerance: Treatment limited secondary to medical complications (Comment) Patient left: in bed;with call bell/phone within reach Nurse Communication: Mobility status         Time: 1425-1446 PT Time Calculation (min): 21  min   Charges:   PT Evaluation $Initial PT Evaluation Tier I: 1 Procedure PT Treatments $Gait Training: 8-22 mins   PT G Codes:          Claretha Cooper 01/27/2014, 2:52 PM Tresa Endo PT [phone]

## 2014-01-27 NOTE — Progress Notes (Signed)
Patient stated that 0.25mg  IV Ativan did not help her.  Dr. Jerilee Hoh notified and order given.  Frederica Kuster  01/27/2014

## 2014-01-27 NOTE — Progress Notes (Signed)
[facility] PROGRESS NOTE  [name] MOQ:[id number] DOB: 10-Mar-1941 DOA: 01/25/2014 PCP: Unice Cobble, MD  Assessment/Plan: Acute on Chronic Respiratory Failure -2/2 COPD with exacerbation.. -Improving. -See below for details.  COPD with Acute Exacerbation -Continue slow steroid titration. -Nebs as needed. -Anxiety playing an important role. -Ativan PRN. -Has been ambulating with PT.  CAD -Stable. No CP.  Code Status: Full Code Family Communication: Patient only  Disposition Plan: Home in 24-48 hours.   Consultants:  none   Antibiotics:  None   Subjective: Feels much better today. Less SOB  Objective: Filed Vitals:   01/27/14 0524 01/27/14 0710 01/27/14 0900 01/27/14 1517  BP: 147/75     Pulse: 91  107   Temp: 97.6 F (36.4 C)     TempSrc: Axillary     Resp: 20     Height:      Weight:      SpO2: 99% 99% 99% 100%   No intake or output data in the 24 hours ending 01/27/14 1522 Filed Weights   01/26/14 0120 01/26/14 0259  Weight: 75.8 kg (167 lb 1.7 oz) 75.4 kg (166 lb 3.6 oz)    Exam:   General:  AA Ox3  Cardiovascular: RRR  Respiratory: CTA B, no wheezing  Abdomen: S/NT/ND/+BS  Extremities: no C/C/E   Neurologic:  Intact, non-focal  Data Reviewed: Basic Metabolic Panel:  Recent Labs Lab 01/25/14 2145 01/26/14 0334 01/27/14 0415  NA 141 139 142  K 4.0 3.7 4.9  CL 103 103 106  CO2 24 20 23   GLUCOSE 130* 197* 173*  BUN 15 14 19   CREATININE 0.86 0.80 0.69  CALCIUM 9.0 8.3* 9.3   Liver Function Tests: No results found for this basename: AST, ALT, ALKPHOS, BILITOT, PROT, ALBUMIN,  in the last 168 hours No results found for this basename: LIPASE, AMYLASE,  in the last 168 hours No results found for this basename: AMMONIA,  in the last 168 hours CBC:  Recent Labs Lab 01/25/14 2145 01/26/14 0334 01/27/14 0415  WBC 9.1 8.1 13.4*  HGB 12.2 11.4* 12.8  HCT 38.9 36.9 39.9  MCV 86.4 86.0 86.0  PLT 171 157 157     Cardiac Enzymes: No results found for this basename: CKTOTAL, CKMB, CKMBINDEX, TROPONINI,  in the last 168 hours BNP (last 3 results)  Recent Labs  11/15/13 0545 11/18/13 0400 01/25/14 2154  PROBNP 969.8* 241.2* 485.2*   CBG: No results found for this basename: GLUCAP,  in the last 168 hours  Recent Results (from the past 240 hour(s))  CULTURE, BLOOD (ROUTINE X 2)     Status: None   Collection Time    01/25/14  9:54 PM      Result Value Ref Range Status   Specimen Description BLOOD LEFT ANTECUBITAL   Final   Special Requests BOTTLES DRAWN AEROBIC AND ANAEROBIC 5ML   Final   Culture  Setup Time     Final   Value: 01/26/2014 03:57     Performed at [facility]   Culture     Final   Value:        BLOOD CULTURE RECEIVED NO GROWTH TO DATE CULTURE WILL BE HELD FOR 5 DAYS BEFORE ISSUING A FINAL NEGATIVE REPORT     Performed at [facility]   Report Status PENDING   Incomplete  CULTURE, BLOOD (ROUTINE X 2)     Status: None   Collection Time    01/25/14 11:15 PM  Result Value Ref Range Status   Specimen Description BLOOD RIGHT ANTECUBITAL   Final   Special Requests BOTTLES DRAWN AEROBIC AND ANAEROBIC 3MLS EACH   Final   Culture  Setup Time     Final   Value: 01/26/2014 03:57     Performed at [facility]   Culture     Final   Value:        BLOOD CULTURE RECEIVED NO GROWTH TO DATE CULTURE WILL BE HELD FOR 5 DAYS BEFORE ISSUING A FINAL NEGATIVE REPORT     Performed at [facility]   Report Status PENDING   Incomplete  MRSA PCR SCREENING     Status: None   Collection Time    01/26/14  2:23 AM      Result Value Ref Range Status   MRSA by PCR NEGATIVE  NEGATIVE Final   Comment:            The GeneXpert MRSA Assay (FDA     approved for NASAL specimens     only), is one component of a     comprehensive MRSA colonization     surveillance program. It is not     intended to diagnose MRSA     infection nor to guide or     monitor treatment for      MRSA infections.     Studies: Ct Angio Chest W/cm &/or Wo Cm  01/26/2014   CLINICAL DATA:  History of cancer and recent hospitalizations. Shortness of breath and hypoxia. Evaluate for pulmonary embolism.  EXAM: CT ANGIOGRAPHY CHEST WITH CONTRAST  TECHNIQUE: Multidetector CT imaging of the chest was performed using the standard protocol during bolus administration of intravenous contrast. Multiplanar CT image reconstructions and MIPs were obtained to evaluate the vascular anatomy.  CONTRAST:  152mL OMNIPAQUE IOHEXOL 350 MG/ML SOLN  COMPARISON:  10/06/2010  FINDINGS: THORACIC INLET/BODY WALL:  Interval removal of a left breast implant. The right implant remains inflated.  MEDIASTINUM:  Normal heart size. No pericardial effusion. Extensive atherosclerosis, including the coronary arteries. There is thinning of the left ventricular apex, with low-attenuation consistent with fibrofatty scarring. No acute vascular findings, including pulmonary embolism. Sensitivity is decreased at the bases secondary to respiratory motion, but the exam is overall diagnostic. There is at least moderate narrowing of the proximal left subclavian artery, which has likely progressed from 2011. Cardiac motion limit sensitivity in measuring stenosis. Mild enlargement of hilar lymph nodes, likely reactive to the emphysema. No suspicious lymphadenopathy.  LUNG WINDOWS:  Moderate centrilobular emphysema. Diffuse bronchial wall thickening, likely also secondary to smoking. Patchy mosaic attenuation of the lungs is likely from air trapping and arterial phase imaging. No consolidation suggestive of pneumonia. No effusion or pneumothorax.  UPPER ABDOMEN:  No acute findings.  OSSEOUS:  No acute fracture.  No suspicious lytic or blastic lesions.  Review of the MIP images confirms the above findings.  IMPRESSION: 1. Negative for pulmonary embolism. 2. Remote left ventricular apex infarct. 3. Emphysema. 4. 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 Correlate with left upper extremity symptoms.   Electronically Signed   By: Jorje Guild M.D.   On: 01/26/2014 00:29   Dg Chest Port 1 View  01/25/2014   CLINICAL DATA:  Shortness of breath  EXAM: PORTABLE CHEST - 1 VIEW  COMPARISON:  01/23/2014  FINDINGS: Chronic pulmonary hyperinflation and interstitial coarsening with apical emphysematous change. No evidence of superimposed pneumonia, edema, effusion, or pneumothorax. Normal heart size  and mediastinal contours. Left glenohumeral total arthroplasty. No acute osseous findings.  IMPRESSION: Emphysema without superimposed pneumonia or edema.   Electronically Signed   By: Jorje Guild M.D.   On: 01/25/2014 22:54    Scheduled Meds: . anastrozole  1 mg Oral Daily  . aspirin EC  81 mg Oral Daily  . busPIRone  15 mg Oral BID  . clonazePAM  0.5 mg Oral BID  . clopidogrel  75 mg Oral Daily  . dextromethorphan-guaiFENesin  1 tablet Oral BID  . docusate sodium  100 mg Oral Daily  . enoxaparin (LOVENOX) injection  40 mg Subcutaneous Q24H  . furosemide  20 mg Oral Daily  . gabapentin  300 mg Oral QHS  . ipratropium-albuterol  3 mL Nebulization Q4H  . levofloxacin  750 mg Oral Daily  . methylPREDNISolone (SOLU-MEDROL) injection  80 mg Intravenous Q12H  . metoprolol tartrate  12.5 mg Oral Daily  . multivitamin with minerals  1 tablet Oral Daily  . nystatin  5 mL Oral BID  . oxybutynin  5 mg Oral QHS  . pantoprazole  40 mg Oral BID  . polyethylene glycol  17 g Oral Daily  . potassium chloride SA  20 mEq Oral Daily  . simvastatin  40 mg Oral q1800  . sodium chloride  3 mL Intravenous Q12H   Continuous Infusions:   Principal Problem:   Acute on chronic respiratory failure with hypoxia Active Problems:   CORONARY ARTERY DISEASE, multiple PCIs in the past, cath 11/25/13 with non obstructive disease continue medical therapy   CARDIOMYOPATHY, ISCHEMIC   HYPERTENSION, BENIGN   COPD exacerbation   Anxiety    Time spent:  35 minutes. Greater than 50% of this time was spent in direct contact with the patient coordinating care.    [facility] Pager [phone]  If 7PM-7AM, please contact night-coverage at [email or web address], password [facility] 01/27/2014, 3:22 PM  LOS: 2 days

## 2014-01-27 NOTE — Progress Notes (Signed)
Patient with complaint of anxiety and she states Ativan pill does not work.  Dr. Jerilee Hoh notified and order given.  Frederica Kuster  01/27/2014

## 2014-01-28 ENCOUNTER — Inpatient Hospital Stay (HOSPITAL_COMMUNITY): Payer: Medicare Other

## 2014-01-28 DIAGNOSIS — J9600 Acute respiratory failure, unspecified whether with hypoxia or hypercapnia: Secondary | ICD-10-CM

## 2014-01-28 LAB — BLOOD GAS, ARTERIAL
ACID-BASE EXCESS: 2.8 mmol/L — AB (ref 0.0–2.0)
Acid-Base Excess: 0.6 mmol/L (ref 0.0–2.0)
Acid-Base Excess: 2.1 mmol/L — ABNORMAL HIGH (ref 0.0–2.0)
Acid-base deficit: 0.7 mmol/L (ref 0.0–2.0)
Acid-base deficit: 0.9 mmol/L (ref 0.0–2.0)
Acid-base deficit: 0.2 mmol/L (ref 0.0–2.0)
BICARBONATE: 27.2 meq/L — AB (ref 20.0–24.0)
BICARBONATE: 26 meq/L — AB (ref 20.0–24.0)
Bicarbonate: 26.8 mEq/L — ABNORMAL HIGH (ref 20.0–24.0)
Bicarbonate: 22.5 mEq/L (ref 20.0–24.0)
Bicarbonate: 29.1 mEq/L — ABNORMAL HIGH (ref 20.0–24.0)
Bicarbonate: 30.5 mEq/L — ABNORMAL HIGH (ref 20.0–24.0)
DRAWN BY: 11249
Delivery systems: POSITIVE
Drawn by: 317871
Drawn by: 317871
Expiratory PAP: 6
FIO2: 0.4 %
FIO2: 0.35 %
FIO2: 0.4 %
FIO2: 1 %
FIO2: 0.4 %
FIO2: 0.4 %
Inspiratory PAP: 12
MECHVT: 380 mL
Mode: POSITIVE
O2 SAT: 98.8 %
O2 SAT: 95.7 %
O2 SAT: 97.5 %
O2 Saturation: 97.9 %
O2 Saturation: 98.5 %
O2 Saturation: 100 %
PATIENT TEMPERATURE: 98.2
PATIENT TEMPERATURE: 98.6
PATIENT TEMPERATURE: 98.6
PATIENT TEMPERATURE: 98.6
PCO2 ART: 31.8 mmHg — AB (ref 35.0–45.0)
PEEP/CPAP: 5 cmH2O
PEEP/CPAP: 5 cmH2O
PEEP: 6 cmH2O
PEEP: 6 cmH2O
PEEP: 5 cmH2O
PH ART: 7.259 — AB (ref 7.350–7.450)
PH ART: 7.463 — AB (ref 7.350–7.450)
PO2 ART: 112 mmHg — AB (ref 80.0–100.0)
Patient temperature: 98.6
Patient temperature: 98.6
Pressure control: 6 cmH2O
RATE: 12 resp/min
RATE: 12 resp/min
RATE: 12 resp/min
RATE: 12 resp/min
RATE: 14 resp/min
TCO2: 25.2 mmol/L (ref 0–100)
TCO2: 24.1 mmol/L (ref 0–100)
TCO2: 24.5 mmol/L (ref 0–100)
TCO2: 20.1 mmol/L (ref 0–100)
TCO2: 26.9 mmol/L (ref 0–100)
TCO2: 28.8 mmol/L (ref 0–100)
VT: 500 mL
VT: 380 mL
pCO2 arterial: 62.9 mmHg (ref 35.0–45.0)
pCO2 arterial: 55.4 mmHg — ABNORMAL HIGH (ref 35.0–45.0)
pCO2 arterial: 52.5 mmHg — ABNORMAL HIGH (ref 35.0–45.0)
pCO2 arterial: 56.1 mmHg — ABNORMAL HIGH (ref 35.0–45.0)
pCO2 arterial: 72.2 mmHg (ref 35.0–45.0)
pH, Arterial: 7.292 — ABNORMAL LOW (ref 7.350–7.450)
pH, Arterial: 7.328 — ABNORMAL LOW (ref 7.350–7.450)
pH, Arterial: 7.335 — ABNORMAL LOW (ref 7.350–7.450)
pH, Arterial: 7.249 — ABNORMAL LOW (ref 7.350–7.450)
pO2, Arterial: 151 mmHg — ABNORMAL HIGH (ref 80.0–100.0)
pO2, Arterial: 104 mmHg — ABNORMAL HIGH (ref 80.0–100.0)
pO2, Arterial: 350 mmHg — ABNORMAL HIGH (ref 80.0–100.0)
pO2, Arterial: 84.4 mmHg (ref 80.0–100.0)
pO2, Arterial: 111 mmHg — ABNORMAL HIGH (ref 80.0–100.0)

## 2014-01-28 LAB — TROPONIN I

## 2014-01-28 LAB — GLUCOSE, CAPILLARY: GLUCOSE-CAPILLARY: 134 mg/dL — AB (ref 70–99)

## 2014-01-28 LAB — BASIC METABOLIC PANEL
BUN: 23 mg/dL (ref 6–23)
CO2: 26 mEq/L (ref 19–32)
Calcium: 9.3 mg/dL (ref 8.4–10.5)
Chloride: 102 mEq/L (ref 96–112)
Creatinine, Ser: 0.81 mg/dL (ref 0.50–1.10)
GFR calc Af Amer: 82 mL/min — ABNORMAL LOW (ref >90)
GFR, EST NON AFRICAN AMERICAN: 71 mL/min — AB (ref >90)
GLUCOSE: 184 mg/dL — AB (ref 70–99)
POTASSIUM: 4.3 meq/L (ref 3.7–5.3)
Sodium: 143 mEq/L (ref 137–147)

## 2014-01-28 LAB — CBC
HCT: 39.5 % (ref 36.0–46.0)
HEMOGLOBIN: 12.2 g/dL (ref 12.0–15.0)
MCH: 27.1 pg (ref 26.0–34.0)
MCHC: 30.9 g/dL (ref 30.0–36.0)
MCV: 87.8 fL (ref 78.0–100.0)
Platelets: 177 10*3/uL (ref 150–400)
RBC: 4.5 MIL/uL (ref 3.87–5.11)
RDW: 15.1 % (ref 11.5–15.5)
WBC: 11.6 10*3/uL — ABNORMAL HIGH (ref 4.0–10.5)

## 2014-01-28 LAB — TRIGLYCERIDES: TRIGLYCERIDES: 89 mg/dL (ref <150)

## 2014-01-28 MED ORDER — FENTANYL CITRATE 0.05 MG/ML IJ SOLN
25.0000 ug | INTRAMUSCULAR | Status: DC | PRN
  Administered 2014-01-28 – 2014-01-30 (×4): 100 ug via INTRAVENOUS
  Filled 2014-01-28 (×4): qty 2

## 2014-01-28 MED ORDER — SODIUM CHLORIDE 0.9 % IV SOLN
0.0000 mg/h | INTRAVENOUS | Status: DC
  Administered 2014-01-28: 1 mg/h via INTRAVENOUS
  Administered 2014-01-29: 2 mg/h via INTRAVENOUS
  Administered 2014-01-30: 3 mg/h via INTRAVENOUS
  Filled 2014-01-28 (×4): qty 10

## 2014-01-28 MED ORDER — FUROSEMIDE 10 MG/ML IJ SOLN
40.0000 mg | Freq: Two times a day (BID) | INTRAMUSCULAR | Status: AC
  Administered 2014-01-28 – 2014-01-29 (×4): 40 mg via INTRAVENOUS
  Filled 2014-01-28 (×5): qty 4

## 2014-01-28 MED ORDER — FENTANYL CITRATE 0.05 MG/ML IJ SOLN
100.0000 ug | Freq: Once | INTRAMUSCULAR | Status: AC
  Administered 2014-01-28: 100 ug via INTRAVENOUS

## 2014-01-28 MED ORDER — FENTANYL CITRATE 0.05 MG/ML IJ SOLN
INTRAMUSCULAR | Status: AC
  Filled 2014-01-28: qty 4

## 2014-01-28 MED ORDER — ETOMIDATE 2 MG/ML IV SOLN
INTRAVENOUS | Status: AC
  Filled 2014-01-28: qty 20

## 2014-01-28 MED ORDER — ASPIRIN 81 MG PO CHEW
81.0000 mg | CHEWABLE_TABLET | Freq: Every day | ORAL | Status: DC
  Administered 2014-01-28 – 2014-02-05 (×8): 81 mg via ORAL
  Filled 2014-01-28 (×10): qty 1

## 2014-01-28 MED ORDER — MIDAZOLAM HCL 2 MG/2ML IJ SOLN
2.0000 mg | Freq: Once | INTRAMUSCULAR | Status: AC
  Administered 2014-01-28: 2 mg via INTRAVENOUS

## 2014-01-28 MED ORDER — LIDOCAINE HCL (CARDIAC) 20 MG/ML IV SOLN
INTRAVENOUS | Status: AC
  Filled 2014-01-28: qty 5

## 2014-01-28 MED ORDER — PROPOFOL 10 MG/ML IV EMUL
INTRAVENOUS | Status: AC
  Filled 2014-01-28: qty 100

## 2014-01-28 MED ORDER — METHYLPREDNISOLONE SODIUM SUCC 125 MG IJ SOLR
80.0000 mg | Freq: Three times a day (TID) | INTRAMUSCULAR | Status: DC
  Administered 2014-01-28 – 2014-01-30 (×7): 80 mg via INTRAVENOUS
  Filled 2014-01-28 (×4): qty 1.28
  Filled 2014-01-28: qty 2
  Filled 2014-01-28 (×2): qty 1.28
  Filled 2014-01-28: qty 2
  Filled 2014-01-28 (×4): qty 1.28

## 2014-01-28 MED ORDER — CHLORHEXIDINE GLUCONATE 0.12 % MT SOLN
15.0000 mL | Freq: Two times a day (BID) | OROMUCOSAL | Status: DC
  Administered 2014-01-28 – 2014-02-06 (×19): 15 mL via OROMUCOSAL
  Filled 2014-01-28 (×20): qty 15

## 2014-01-28 MED ORDER — CISATRACURIUM BOLUS VIA INFUSION
5.0000 mg | Freq: Once | INTRAVENOUS | Status: AC
  Administered 2014-01-28: 5 mg via INTRAVENOUS
  Filled 2014-01-28: qty 5

## 2014-01-28 MED ORDER — MIDAZOLAM HCL 2 MG/2ML IJ SOLN
INTRAMUSCULAR | Status: AC
  Filled 2014-01-28: qty 4

## 2014-01-28 MED ORDER — PANTOPRAZOLE SODIUM 40 MG PO PACK
40.0000 mg | PACK | Freq: Two times a day (BID) | ORAL | Status: DC
  Administered 2014-01-28 – 2014-01-31 (×7): 40 mg
  Filled 2014-01-28 (×9): qty 20

## 2014-01-28 MED ORDER — BIOTENE DRY MOUTH MT LIQD
15.0000 mL | Freq: Two times a day (BID) | OROMUCOSAL | Status: DC
  Administered 2014-01-28 – 2014-02-06 (×16): 15 mL via OROMUCOSAL

## 2014-01-28 MED ORDER — SODIUM CHLORIDE 0.9 % IV SOLN
3.0000 ug/kg/min | INTRAVENOUS | Status: DC
  Administered 2014-01-28: 3 ug/kg/min via INTRAVENOUS
  Administered 2014-01-29: 1.503 ug/kg/min via INTRAVENOUS
  Filled 2014-01-28 (×2): qty 20

## 2014-01-28 MED ORDER — PROPOFOL 10 MG/ML IV EMUL
0.0000 ug/kg/min | INTRAVENOUS | Status: DC
  Administered 2014-01-28 (×3): 40 ug/kg/min via INTRAVENOUS
  Administered 2014-01-28: 30 ug/kg/min via INTRAVENOUS
  Administered 2014-01-28 – 2014-01-29 (×6): 40 ug/kg/min via INTRAVENOUS
  Administered 2014-01-30: 20 ug/kg/min via INTRAVENOUS
  Administered 2014-01-30: 40 ug/kg/min via INTRAVENOUS
  Administered 2014-01-31 – 2014-02-01 (×3): 20 ug/kg/min via INTRAVENOUS
  Filled 2014-01-28 (×13): qty 100

## 2014-01-28 MED ORDER — SUCCINYLCHOLINE CHLORIDE 20 MG/ML IJ SOLN
INTRAMUSCULAR | Status: AC
  Filled 2014-01-28: qty 1

## 2014-01-28 MED ORDER — ARTIFICIAL TEARS OP OINT
1.0000 "application " | TOPICAL_OINTMENT | Freq: Three times a day (TID) | OPHTHALMIC | Status: DC
  Administered 2014-01-28 – 2014-01-31 (×11): 1 via OPHTHALMIC
  Filled 2014-01-28: qty 3.5

## 2014-01-28 MED ORDER — LORAZEPAM 2 MG/ML IJ SOLN
1.0000 mg | INTRAMUSCULAR | Status: DC | PRN
  Administered 2014-01-28 (×2): 1 mg via INTRAVENOUS
  Filled 2014-01-28: qty 1

## 2014-01-28 MED ORDER — ROCURONIUM BROMIDE 50 MG/5ML IV SOLN
INTRAVENOUS | Status: AC
  Filled 2014-01-28: qty 2

## 2014-01-28 MED ORDER — METHYLPREDNISOLONE SODIUM SUCC 125 MG IJ SOLR
80.0000 mg | Freq: Once | INTRAMUSCULAR | Status: AC
  Administered 2014-01-28: 80 mg via INTRAVENOUS
  Filled 2014-01-28: qty 1.28

## 2014-01-28 MED ORDER — MIDAZOLAM BOLUS VIA INFUSION
1.0000 mg | INTRAVENOUS | Status: DC | PRN
  Filled 2014-01-28: qty 2

## 2014-01-28 NOTE — Progress Notes (Signed)
CAT completed. Placed back on 3L/minute nasal canula. Patient tachycardic and tachypnea remains. Patient using all accessory muscles with prolonged exhalations, and complains she feels no better. Auscultation reveals better air movement, with diffuse wheezing throughout. Patient appears tired and somewhat confused. Spoke with RN. MD paged. Orders received for transfer to SDU and initiate BiPAP.

## 2014-01-28 NOTE — Progress Notes (Signed)
Patient having respiratory distress despite prn BD's. Family at bedside with concerns that mother is getting worse. Spoke with Kathline Magic, NP. Orders received for 10mg  albuterol CAT and transfer to telemetry floor.

## 2014-01-28 NOTE — Progress Notes (Signed)
RT changed RR from 14 to 16 due to a panic CO2 of 72.2 per E Link MD. RT will get another ABG in 2 hours. RT will continue to monitor.

## 2014-01-28 NOTE — Progress Notes (Signed)
Patient continues to be in distress with CAT. Spoke with RN for possible dose of steroids. MD paged for orders.

## 2014-01-28 NOTE — Progress Notes (Signed)
[place] Progress Note Patient Name: [name] DOB: [birth year] MRN: [id number]  Date of Service  01/28/2014   HPI/Events of Note   No paralyzed. ABG reviewed. Minimally acidotic.  eICU Interventions   Increase RR to 14. ABG in 2 hrs.   Intervention Category Major Interventions: Respiratory failure - evaluation and management  Mariea Clonts 01/28/2014, 6:41 PM

## 2014-01-28 NOTE — Progress Notes (Signed)
Patient's daughters assisted patient with bath tonight.  Per daughter it took 3 people to assist her get cleaned up and patient was very short of breath and needed a lot of assistance during the process.  Shortly after bath, around 2144 pm patient became very SOB and had increased wheezing. Patient was given PRN breathing treatment with no relief.  Respiratory therapy came up to see patient and recommended an hour nebulizer treatment.  Fredirick Maudlin, NP was notified and patient had to be transferred to telemetry in order to administer this neb treatment.  Patient anxious but understanding during transfer.  Daughters were beside patient during transfer to room 1408. [name]

## 2014-01-28 NOTE — Consult Note (Signed)
PULMONARY / CRITICAL CARE MEDICINE   Name: [name] MRN: [id number] DOB: [birth year]    ADMISSION DATE:  01/25/2014 CONSULTATION DATE:  01/28/14  REFERRING MD :  Dr Jerilee Hoh PRIMARY SERVICE: [facility]>>>PCCM  CHIEF COMPLAINT:  Resp failure  BRIEF PATIENT DESCRIPTION: 73 yr old COPD (RA pt in office, recent admission requiring ETT)admitted SOB, now in distress , NIMV.  SIGNIFICANT EVENTS / STUDIES:  4/9 - admission SOB 4/9 CT - Negative for pulmonary embolism.<BR>2. Remote left ventricular apex infarct.<BR>3. Emphysema.<BR>4. 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 Correlate with left upper extremity symptoms 4/10 - declined requiring NIMV  LINES / TUBES:   CULTURES: 4/8 BC>>>  ANTIBIOTICS: Levofloxacin 4/9>>>  HISTORY OF PRESENT ILLNESS:  73 yo female with h/o copd on chronic oxygen at home, just d/c from rehab after a hospitalization in jan requiring intubation comes in with several days of worsening sob and cough. She has worsneing cough reported and secretions. She was on a pred taper. Admitted to hospital 4/9 treated with ABX and steroids. Worsening status requiring NIMV. She sees Dr [name] in our office and is Gold C COPD , on nocturnal O2 since 10/10 for FU.  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 Cardiac cath 08/31/12 with severe stenosis proximal LAD now s/p drug eluting stent proximal LAD  CT was done at [facility], neg infiltrate, neg PE   PAST MEDICAL HISTORY :  Past Medical History  Diagnosis Date  . Other emphysema   . Unspecified arthropathy, shoulder region   . Gastric ulcer, unspecified as acute or chronic, without mention of hemorrhage, perforation, or obstruction   . Benign paroxysmal positional vertigo   . COPD (chronic obstructive pulmonary disease)   .  Diverticula of colon   . Osteopenia   . Personal history of colonic polyps 2007    tubular adenoma high grade dysplasia  . Diverticulitis   . Hyperlipemia   . Fracture of ankle, bimalleolar, right, closed   . Hypoxemia   . C. difficile diarrhea   . Varicose vein   . Asthma   . GERD (gastroesophageal reflux disease)   . Hepatitis 1989  . Arthritis     "both shoulders"   . TIA (transient ischemic attack) 04/2008; 08/21/2012    "slight droop left lower lip after 08/21/12 TIA"  . On home oxygen therapy   . Ischemic cardiomyopathy     a. EF 45-50% 2011. b. EF 35-40% by cath 09/01/12.  . Night terrors, adult     dr Leonie Man referred  for PSG in 2011, and again in 2013   . Breast cancer 04/08/13 bx    left  . Allergy   . CAD (coronary artery disease)     a. ant MI 58 tx with TPA/PTCA. b. Prior stenting procedures including RCA stent, DES to LCx 04/2011. c. [place] -> DES  to prox LAD 08/2012.   Marland Kitchen Hypertension     TAKES FOR H/O STROKE  . CVA (cerebral vascular accident)   . Stroke, acute, embolic     Nov 7124 , [facility] , was treated with TPA.   . 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 Path G1  IDC, 1.6 cm, neg margin, neg SLN    Past Surgical History  Procedure Laterality Date  . Total shoulder arthroplasty  2012    left  . Orif ankle fracture  2007    right  . Martial megeti  2671'I    "bladder operation; not successful " (08/31/2012)  . Varicose vein surgery  1983; 1985    "both legs both times" (08/31/2012)  . Total shoulder replacement  12/05/2010    Dr Onnie Graham  . Abdominal hysterectomy      TAH w/BSO  . Augmentation mammaplasty  1975  . Coronary angioplasty with stent placement  2010  . Coronary angioplasty  1990    "3 times" (08/31/2012)  . Colon surgery  07/07/2011    Dr. Hassell Done  . Appendectomy  1960  . Nose surgery      1977  . Breast  augmenttation    . Catarct  right      RIGHT EYE  . Breast lumpectomy with needle localization and axillary sentinel lymph node bx Left 06/02/2013    Procedure: BREAST LUMPECTOMY WITH NEEDLE LOCALIZATION AND AXILLARY SENTINEL LYMPH NODE BX;  Surgeon: Haywood Lasso, MD;  Location: [facility];  Service: General;  Laterality: Left;  NEEDLE LOC BCG 7:30 NUC MED 9:30 RECONSTRUCTION TO FOLLOW 1 HOUR added   . Breast implant removal Left 06/02/2013    Procedure: REMOVAL BREAST IMPLANTS; LEFT BREAST CAPSULECTOMY WITH PLACEMENT OF TISSUE EXPANDER AND USE OF FLEX HD;  Surgeon: Crissie Reese, MD;  Location: [facility];  Service: Plastics;  Laterality: Left;   Prior to Admission medications   Medication Sig Start Date End Date Taking? Authorizing Provider  albuterol (PROVENTIL HFA;VENTOLIN HFA) 108 (90 BASE) MCG/ACT inhaler Inhale 1 puff into the lungs every 6 (six) hours as needed for wheezing or shortness of breath.   Yes Historical Provider, MD  albuterol (PROVENTIL) (2.5 MG/3ML) 0.083% nebulizer solution Take 2.5 mg by nebulization every 6 (six) hours as needed for wheezing or shortness of breath.   Yes Historical Provider, MD  anastrozole (ARIMIDEX) 1 MG tablet Take 1 mg by mouth daily.   Yes Historical Provider, MD  aspirin EC 81 MG tablet Take 81 mg by mouth daily.   Yes Historical Provider, MD  B Complex Vitamins (B-COMPLEX/B-12 PO) Take 1 tablet by mouth daily.   Yes Historical Provider, MD  busPIRone (BUSPAR) 15 MG tablet Take 15 mg by mouth 2 (two) times daily.   Yes Historical Provider, MD  Calcium Carb-Cholecalciferol (CALCIUM 600 + D PO) Take 0.5 tablets by mouth 2 (two) times daily.   Yes Historical Provider, MD  Cholecalciferol (VITAMIN D) 2000 UNITS CAPS Take 1 capsule by mouth 2 (two) times daily.   Yes Historical Provider, MD  clonazePAM (KLONOPIN) 0.5 MG tablet 2 (two) times daily. Take 1/4 tablet by mouth every morning for anxiety 12/20/13  Yes Estill Dooms, MD  clopidogrel (PLAVIX) 75 MG  tablet Take 1 tablet (75 mg total) by mouth daily. 01/20/14  Yes Burnell Blanks, MD  CRANBERRY PO Take 1 tablet by mouth daily.   Yes Historical Provider, MD  Fluticasone-Salmeterol (ADVAIR) 250-50 MCG/DOSE AEPB Inhale 1 puff into the lungs 2 (two) times daily.   Yes Historical Provider, MD  gabapentin (NEURONTIN) 300 MG capsule Take 300 mg by mouth at bedtime.   Yes Historical Provider, MD  metoprolol tartrate (LOPRESSOR) 25 MG tablet Take 12.5 mg by mouth daily.   Yes Historical Provider, MD  Multiple Vitamin (MULTIVITAMIN WITH MINERALS)  TABS tablet Take 0.5 tablets by mouth 2 (two) times daily.   Yes Historical Provider, MD  Omega-3 Fatty Acids (FISH OIL) 1200 MG CAPS Take 1 capsule by mouth daily.   Yes Historical Provider, MD  oxybutynin (DITROPAN) 5 MG tablet Take 5 mg by mouth at bedtime.    Yes Historical Provider, MD  pantoprazole (PROTONIX) 40 MG tablet Take 1 tablet (40 mg total) by mouth 2 (two) times daily. 12/23/13  Yes Burnell Blanks, MD  potassium chloride SA (K-DUR,KLOR-CON) 20 MEQ tablet Take 20 mEq by mouth daily.   Yes Historical Provider, MD  Pyridoxine HCl (VITAMIN B-6 PO) Take 1 tablet by mouth daily.   Yes Historical Provider, MD  simvastatin (ZOCOR) 40 MG tablet Take 40 mg by mouth daily.   Yes Historical Provider, MD  tiotropium (SPIRIVA) 18 MCG inhalation capsule Place 18 mcg into inhaler and inhale daily.   Yes Historical Provider, MD  traMADol (ULTRAM) 50 MG tablet Take 75 mg by mouth every 6 (six) hours as needed for moderate pain.   Yes Historical Provider, MD  furosemide (LASIX) 20 MG tablet Take 1 tablet (20 mg total) by mouth daily. 11/26/13   Cecilie Kicks, NP  HYDROcodone-homatropine [facility]) 5-1.5 MG/5ML syrup Take 5 mLs by mouth 2 (two) times daily as needed for cough. 01/25/14   Rigoberto Noel, MD  nystatin (MYCOSTATIN) 100000 UNIT/ML suspension Take 5 mLs (500,000 Units total) by mouth 2 (two) times daily. 01/23/14   Rigoberto Noel, MD   Allergies   Allergen Reactions  . Escitalopram Oxalate Shortness Of Breath, Diarrhea and Nausea And Vomiting    Hot flashes, dizziness  . Indomethacin Other (See Comments)    "made me very very dizzy and made me have vertigo" (08/31/2012)  . Nsaids     Jerking, hot flashes, nausea  . Codeine Nausea And Vomiting    "I can take codeine in my cough medicine" (08/31/2012)  . Sulfonamide Derivatives Other (See Comments)    Drug induced hepatitis in 1989    FAMILY HISTORY:  Family History  Problem Relation Age of Onset  . Breast cancer Sister     x 2  . Colon cancer Neg Hx   . Diabetes Paternal Grandmother   . Uterine cancer Paternal Grandmother   . Lung cancer Paternal Grandmother   . Heart disease Mother   . Alcohol abuse Mother   . Heart disease Father   . Pancreatic cancer Cousin   . Colon polyps Sister   . Diabetes Maternal Uncle   . Heart disease Maternal Grandfather   . Heart disease Paternal Grandfather   . Stroke Paternal Grandfather    SOCIAL HISTORY:  reports that she quit smoking about 16 years ago. Her smoking use included Cigarettes. She has a 40 pack-year smoking history. She has never used smokeless tobacco. She reports that she drinks about 1.8 ounces of alcohol per week. She reports that she does not use illicit drugs.  REVIEW OF SYSTEMS:  Reports wanting water, no abdo pain, no cp, no n/v/d, no weakness reported, alll systems reviwewed  SUBJECTIVE: "I dont want to be intubated unless i will die without it"  VITAL SIGNS: Temp:  [97.4 F (36.3 C)-98.6 F (37 C)] 98.6 F (37 C) (04/11 0400) Pulse Rate:  [63-142] 95 (04/11 0600) Resp:  [12-22] 14 (04/11 0600) BP: (116-164)/(57-130) 116/63 mmHg (04/11 0600) SpO2:  [91 %-100 %] 95 % (04/11 0600) FiO2 (%):  [30 %-40 %] 30 % (04/11 0600) HEMODYNAMICS:  VENTILATOR SETTINGS: Vent Mode:  [-] Other (Comment) FiO2 (%):  [30 %-40 %] 30 % Set Rate:  [12 bmp] 12 bmp PEEP:  [6 cmH20] 6 cmH20 INTAKE / OUTPUT: Intake/Output      04/10 0701 - 04/11 0700 04/11 0701 - 04/12 0700   P.O. 720    I.V. (mL/kg) 30 (0.4)    Total Intake(mL/kg) 750 (9.9)    Net +750          Urine Occurrence 3 x    Stool Occurrence 1 x      PHYSICAL EXAMINATION: General:  On  Nimv, severe distress Neuro:  Awake, alert, intact, nonfocal HEENT:  jvd noted Cardiovascular:  s1 s2 ST no r/ m Lungs:  Diffuse wheezing, poor air entry, wheezing from neck Abdomen:  Soft, BS wnl, no r/g Musculoskeletal:  No edema Skin:  No rash  LABS:  CBC  Recent Labs Lab 01/26/14 0334 01/27/14 0415 01/28/14 0635  WBC 8.1 13.4* 11.6*  HGB 11.4* 12.8 12.2  HCT 36.9 39.9 39.5  PLT 157 157 177   Coag's No results found for this basename: APTT, INR,  in the last 168 hours BMET  Recent Labs Lab 01/26/14 0334 01/27/14 0415 01/28/14 0635  NA 139 142 143  K 3.7 4.9 4.3  CL 103 106 102  CO2 20 23 26   BUN 14 19 23   CREATININE 0.80 0.69 0.81  GLUCOSE 197* 173* 184*   Electrolytes  Recent Labs Lab 01/26/14 0334 01/27/14 0415 01/28/14 0635  CALCIUM 8.3* 9.3 9.3   Sepsis Markers  Recent Labs Lab 01/25/14 2231  LATICACIDVEN 1.49   ABG  Recent Labs Lab 01/25/14 2320 01/28/14 0137 01/28/14 0309  PHART 7.337* 7.259* 7.292*  PCO2ART 46.6* 62.9* 55.4*  PO2ART 69.3* 151.0* 112.0*   Liver Enzymes No results found for this basename: AST, ALT, ALKPHOS, BILITOT, ALBUMIN,  in the last 168 hours Cardiac Enzymes  Recent Labs Lab 01/25/14 2154  PROBNP 485.2*   Glucose No results found for this basename: GLUCAP,  in the last 168 hours  Imaging Dg Chest Port 1 View  01/28/2014   CLINICAL DATA:  Respiratory failure. History of emphysema, COPD, TIA, cardiomyopathy, left breast cancer, Coronary artery disease, hypertension, stroke, previous smoker.  EXAM: PORTABLE CHEST - 1 VIEW  COMPARISON:  01/25/2014  FINDINGS: Shallow inspiration. Normal heart size and pulmonary vascularity. Probable emphysematous changes and fibrosis in the  lungs without focal airspace disease. No blunting of costophrenic angles. No pneumothorax. Postoperative changes in the left shoulder. No change since prior study.  IMPRESSION: No active disease.   Electronically Signed   By: Lucienne Capers M.D.   On: 01/28/2014 02:35     CXR: chronic int changes, bibasilar predominant  ASSESSMENT / PLAN:  PULMONARY A: Acute Exacerbation COPD, Acute resp failure, r/o failure contribution P:   ABG reviewed, repeat now on NIMV, however she is in now severe distress and required ETT emergent Recommend ETT placement, 8 cc/kg, rate 16, abg to follow Follow closely for autopeep pcxr in am  Maintain even to slight neg balance Change frequency steroids from qday to q8h BDers q4h May need vocal cord assessment as outpt  CARDIOVASCULAR A: Known systolic heart failure, CAD P:  Trop neg Consider even to slight neg balance Tele plavix Repeat trop with declines  RENAL A:  Pos balance 750 P:   Lasix Chem in am  kvo  GASTROINTESTINAL A:  distress P:   Npo, needs ett ppi  HEMATOLOGIC A:  DVT  prevention, NEG PE on CT P: lov crt in am   INFECTIOUS A:  NO evidence PNA, infection, some change in sputum per pt on admission, recent nosocomial exposure P:   Levofloxacin ok for now, will consider to dc all abx If fever or declines, would alter off levo given res to pseudo and add ceftaz, vanc  ENDOCRINE A:  steroids P:   cbg Steroids increase, ssi  NEUROLOGIC A:  Benzo dep, PTSD from last ETT P:   Maintain current regimen May need IV versed with NIMV ETT them propofol, had success with this in past  TODAY'S SUMMARY: COPD exac, recent vent stay, NIMV scheduled failing, needs ett, does not appear infected Will change to [facility] service Called and d/w Dr [name] with pt and called her daughter agrees  I have personally obtained a history, examined the patient, evaluated laboratory and imaging results, formulated the assessment and plan and  placed orders. CRITICAL CARE: The patient is critically ill with multiple organ systems failure and requires high complexity decision making for assessment and support, frequent evaluation and titration of therapies, application of advanced monitoring technologies and extensive interpretation of multiple databases. Critical Care Time devoted to patient care services described in this note is 40 minutes.   Lavon Paganini. Titus Mould, MD, [place] Pgr: [facility]  Pulmonary and [place] Pager: [phone]  01/28/2014, 7:42 AM

## 2014-01-28 NOTE — Progress Notes (Signed)
[facility] hospitalist progress note. Chief complaint. Dyspnea, wheezing. History of present illness. This 73 year old female in hospital with acute on chronic respiratory failure thought secondary to severe COPD with acute exacerbation. Patient noted to be wheezing and dyspneic overnight and respiratory therapy recommended one-hour continuous albuterol as well as an extra dose of Solu-Medrol. Patient was moved to telemetry floor to allow the continuous albuterol treatment. Unfortunately this did not seem to improve her wheezing or dyspnea and the patient was moved to step down and initiated on BiPAP. An arterial blood gas was obtained post initiation of BiPAP with results pH 7.292, PCO2 55.4, PO2 112, bicarbonate 26. Vital signs. Temperature 98.6, pulse 1 awake, respiration 12, blood pressure 133/57. O2 sats 91%. General appearance. Well-developed elderly female who is alert and with mild dyspnea on BiPAP. Cardiac. Regular but mildly tachycardic. No jugular venous distention or edema. Lungs. Poor air movement in general but clear without crackles or rhonchi. No current wheezing auscultated. Abdomen. Soft with positive bowel sounds. No pain. Impression/plan. Problem #1. Dyspnea, wheezing. Patient appears improved on BiPAP support. Will continue current BiPAP settings and further followup ABG per discretion of rounding physician. Patient appears clinically stable at this time.

## 2014-01-28 NOTE — Progress Notes (Addendum)
Pt HR 140-148. BP 152/130 R 24-29, oxygen saturation 99% 3L via [place]. After  Hour long Neb treatment and Solumedrol 80mg  IV Pt still with an increased WOB. Fredirick Maudlin, NP notified and order for stepdown bed placed in Epic. Pt transferred to room 1236, report give to Anderson Malta, RN at bedside. Daughters and granddaughter at bedside during transfer.

## 2014-01-28 NOTE — Procedures (Signed)
Intubation Procedure Note CARRON MCMURRY [id number] 1941-05-21  Procedure: Intubation Indications: Respiratory insufficiency  Procedure Details Consent: Risks of procedure as well as the alternatives and risks of each were explained to the (patient/caregiver).  Consent for procedure obtained. Time Out: Verified patient identification, verified procedure, site/side was marked, verified correct patient position, special equipment/implants available, medications/allergies/relevent history reviewed, required imaging and test results available.  Performed  Maximum sterile technique was used including gloves, gown, hand hygiene and mask.  MAC and 3    Evaluation Hemodynamic Status: BP stable throughout; O2 sats: stable throughout Patient's Current Condition: stable Complications: No apparent complications Patient did tolerate procedure well. Chest X-ray ordered to verify placement.  CXR: pending.   Raylene Miyamoto 01/28/2014

## 2014-01-28 NOTE — Progress Notes (Signed)
[facility] Progress Note Patient Name: [name] DOB: [birth year] MRN: [id number]  Date of Service  01/28/2014   HPI/Events of Note   Hypotension in setting of air trapping. Auto-Peep ~ 12 per resp therapist at bedside. Breath stacking.   eICU Interventions   Paralysis and increased sedation. IV team to place third IV. Central line if unable. Pressors if unable to maintain BP. ABG in 20 min.    Intervention Category Major Interventions: Respiratory failure - evaluation and management;Hypotension - evaluation and management  Mariea Clonts 01/28/2014, 3:42 PM

## 2014-01-28 NOTE — Progress Notes (Signed)
[facility] Progress Note Patient Name: [name] DOB: 1940/12/26 MRN: [id number]  Date of Service  01/28/2014   HPI/Events of Note   ABG noted.  eICU Interventions   Increase rate to 16. Repeat ABG in 2 hrs.    Intervention Category Major Interventions: Respiratory failure - evaluation and management  Mariea Clonts 01/28/2014, 9:36 PM

## 2014-01-29 ENCOUNTER — Inpatient Hospital Stay (HOSPITAL_COMMUNITY): Payer: Medicare Other

## 2014-01-29 LAB — CBC WITH DIFFERENTIAL/PLATELET
BASOS PCT: 0 % (ref 0–1)
Basophils Absolute: 0 10*3/uL (ref 0.0–0.1)
Eosinophils Absolute: 0 10*3/uL (ref 0.0–0.7)
Eosinophils Relative: 0 % (ref 0–5)
HCT: 36.7 % (ref 36.0–46.0)
Hemoglobin: 11.4 g/dL — ABNORMAL LOW (ref 12.0–15.0)
LYMPHS ABS: 0.7 10*3/uL (ref 0.7–4.0)
Lymphocytes Relative: 7 % — ABNORMAL LOW (ref 12–46)
MCH: 27.3 pg (ref 26.0–34.0)
MCHC: 31.1 g/dL (ref 30.0–36.0)
MCV: 87.8 fL (ref 78.0–100.0)
Monocytes Absolute: 0.4 10*3/uL (ref 0.1–1.0)
Monocytes Relative: 5 % (ref 3–12)
NEUTROS PCT: 88 % — AB (ref 43–77)
Neutro Abs: 8.2 10*3/uL — ABNORMAL HIGH (ref 1.7–7.7)
PLATELETS: 172 10*3/uL (ref 150–400)
RBC: 4.18 MIL/uL (ref 3.87–5.11)
RDW: 15.3 % (ref 11.5–15.5)
WBC: 9.3 10*3/uL (ref 4.0–10.5)

## 2014-01-29 LAB — COMPREHENSIVE METABOLIC PANEL
ALT: 20 U/L (ref 0–35)
AST: 14 U/L (ref 0–37)
Albumin: 3.4 g/dL — ABNORMAL LOW (ref 3.5–5.2)
Alkaline Phosphatase: 78 U/L (ref 39–117)
BUN: 23 mg/dL (ref 6–23)
CHLORIDE: 101 meq/L (ref 96–112)
CO2: 34 meq/L — AB (ref 19–32)
CREATININE: 0.74 mg/dL (ref 0.50–1.10)
Calcium: 8.7 mg/dL (ref 8.4–10.5)
GFR calc Af Amer: >90 mL/min (ref >90)
GFR, EST NON AFRICAN AMERICAN: 83 mL/min — AB (ref >90)
GLUCOSE: 149 mg/dL — AB (ref 70–99)
Potassium: 3.7 mEq/L (ref 3.7–5.3)
Sodium: 142 mEq/L (ref 137–147)
Total Protein: 6.3 g/dL (ref 6.0–8.3)

## 2014-01-29 LAB — BLOOD GAS, ARTERIAL
ACID-BASE EXCESS: 6.3 mmol/L — AB (ref 0.0–2.0)
ACID-BASE EXCESS: 7 mmol/L — AB (ref 0.0–2.0)
Acid-Base Excess: 5.5 mmol/L — ABNORMAL HIGH (ref 0.0–2.0)
Bicarbonate: 34 mEq/L — ABNORMAL HIGH (ref 20.0–24.0)
Bicarbonate: 33.3 mEq/L — ABNORMAL HIGH (ref 20.0–24.0)
Bicarbonate: 33.8 mEq/L — ABNORMAL HIGH (ref 20.0–24.0)
DRAWN BY: 11249
DRAWN BY: 11249
Drawn by: 11249
FIO2: 0.4 %
FIO2: 0.4 %
FIO2: 0.4 %
LHR: 16 {breaths}/min
LHR: 12 {breaths}/min
MECHVT: 500 mL
MECHVT: 500 mL
O2 SAT: 98.6 %
O2 Saturation: 98.1 %
O2 Saturation: 98.4 %
PCO2 ART: 74 mmHg — AB (ref 35.0–45.0)
PEEP: 5 cmH2O
PEEP: 5 cmH2O
PEEP: 5 cmH2O
PH ART: 7.284 — AB (ref 7.350–7.450)
PO2 ART: 117 mmHg — AB (ref 80.0–100.0)
PO2 ART: 113 mmHg — AB (ref 80.0–100.0)
Patient temperature: 98.6
Patient temperature: 98.6
Patient temperature: 98.6
RATE: 12 resp/min
TCO2: 31.5 mmol/L (ref 0–100)
TCO2: 30.5 mmol/L (ref 0–100)
TCO2: 31 mmol/L (ref 0–100)
VT: 380 mL
pCO2 arterial: 62.8 mmHg (ref 35.0–45.0)
pCO2 arterial: 62.2 mmHg (ref 35.0–45.0)
pH, Arterial: 7.344 — ABNORMAL LOW (ref 7.350–7.450)
pH, Arterial: 7.354 (ref 7.350–7.450)
pO2, Arterial: 115 mmHg — ABNORMAL HIGH (ref 80.0–100.0)

## 2014-01-29 LAB — GLUCOSE, CAPILLARY
GLUCOSE-CAPILLARY: 152 mg/dL — AB (ref 70–99)
Glucose-Capillary: 149 mg/dL — ABNORMAL HIGH (ref 70–99)
Glucose-Capillary: 145 mg/dL — ABNORMAL HIGH (ref 70–99)

## 2014-01-29 MED ORDER — DOCUSATE SODIUM 50 MG/5ML PO LIQD
100.0000 mg | Freq: Every day | ORAL | Status: DC
  Administered 2014-01-29 – 2014-01-31 (×3): 100 mg via ORAL
  Filled 2014-01-29 (×4): qty 10

## 2014-01-29 MED ORDER — VECURONIUM BROMIDE 10 MG IV SOLR: 5.0000 mg | INTRAVENOUS | Status: DC | PRN

## 2014-01-29 MED ORDER — VITAL HIGH PROTEIN PO LIQD
1000.0000 mL | ORAL | Status: DC
  Administered 2014-01-30 (×2): 1000 mL
  Filled 2014-01-29: qty 1000

## 2014-01-29 MED ORDER — VITAL HIGH PROTEIN PO LIQD
1000.0000 mL | ORAL | Status: DC
  Administered 2014-01-29: 1000 mL
  Filled 2014-01-29: qty 1000

## 2014-01-29 MED ORDER — PRO-STAT SUGAR FREE PO LIQD
30.0000 mL | Freq: Two times a day (BID) | ORAL | Status: DC
  Administered 2014-01-29 – 2014-01-30 (×2): 30 mL
  Filled 2014-01-29 (×4): qty 30

## 2014-01-29 MED ORDER — FUROSEMIDE 20 MG PO TABS
20.0000 mg | ORAL_TABLET | Freq: Every day | ORAL | Status: DC
  Administered 2014-01-30: 20 mg via ORAL
  Filled 2014-01-29: qty 1

## 2014-01-29 MED ORDER — ACETAMINOPHEN 160 MG/5ML PO SOLN
650.0000 mg | Freq: Four times a day (QID) | ORAL | Status: DC | PRN
Start: 2014-01-29 — End: 2014-02-02
  Administered 2014-01-29: 650 mg
  Filled 2014-01-29: qty 20.3

## 2014-01-29 NOTE — Procedures (Signed)
Central Venous Catheter Insertion Procedure Note MADDIX KLIEWER [id number] 03-Jul-1941  Procedure: Insertion of Central Venous Catheter Indications: Drug and/or fluid administration  Procedure Details Consent: Unable to obtain consent because of emergent medical necessity. Time Out: Verified patient identification, verified procedure, site/side was marked, verified correct patient position, special equipment/implants available, medications/allergies/relevent history reviewed, required imaging and test results available.  Performed  Maximum sterile technique was used including antiseptics, cap, gloves, gown, hand hygiene, mask and sheet. Skin prep: Chlorhexidine; local anesthetic administered A antimicrobial bonded/coated triple lumen catheter was placed in the right subclavian vein using the Seldinger technique.  Evaluation Blood flow good Complications: No apparent complications Patient did tolerate procedure well. Chest X-ray ordered to verify placement.  CXR: normal.  Guy Begin 01/28/2014, 8:15 PM

## 2014-01-29 NOTE — Progress Notes (Signed)
PULMONARY / CRITICAL CARE MEDICINE   Name: [name] MRN: [id number] DOB: 03-25-1941    ADMISSION DATE:  01/25/2014 CONSULTATION DATE:  01/28/14  REFERRING MD :  Dr Jerilee Hoh PRIMARY SERVICE: [facility]>>>PCCM  CHIEF COMPLAINT:  Resp failure  BRIEF PATIENT DESCRIPTION: 73 yr old COPD (RA pt in office, recent admission requiring ETT)admitted SOB, now in distress , NIMV failed -ETT  SIGNIFICANT EVENTS / STUDIES:  4/9 - admission SOB 4/9 CT - Negative for pulmonary embolism.<BR>2. Remote left ventricular apex infarct.<BR>3. Emphysema.<BR>4. 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 Correlate with left upper extremity symptoms 4/10 - declined requiring NIMV 4/11- severe dyschrony, paralyzed  LINES / TUBES: 4/11 ETT>>> 4/11 [place]>>> 4/10 foley>>>  CULTURES: 4/8 BC>>> 4/12 sptum>>>  ANTIBIOTICS: Levofloxacin 4/9>>>  SUBJECTIVE: neg balance 1.8 liters, paralysis required  VITAL SIGNS: Temp:  [95.7 F (35.4 C)-99.3 F (37.4 C)] 99.3 F (37.4 C) (04/12 0800) Pulse Rate:  [84-99] 98 (04/12 0800) Resp:  [3-16] 12 (04/12 0800) BP: (85-150)/(39-115) 128/54 mmHg (04/12 0800) SpO2:  [94 %-100 %] 94 % (04/12 0800) FiO2 (%):  [40 %-100 %] 40 % (04/12 0802) HEMODYNAMICS:   VENTILATOR SETTINGS: Vent Mode:  [-] PRVC FiO2 (%):  [40 %-100 %] 40 % Set Rate:  [12 bmp-16 bmp] 12 bmp Vt Set:  [380 mL-500 mL] 500 mL PEEP:  [5 cmH20] 5 cmH20 Plateau Pressure:  [14 cmH20-24 cmH20] 19 cmH20 INTAKE / OUTPUT: Intake/Output     04/11 0701 - 04/12 0700 04/12 0701 - 04/13 0700   P.O.     I.V. (mL/kg) 1485.6 (19.4) 87 (1.1)   NG/GT 60    Total Intake(mL/kg) 1545.6 (20.2) 87 (1.1)   Urine (mL/kg/hr) 3350 (1.8) 45 (0.3)   Total Output 3350 45   Net -1804.4 +42        Urine Occurrence 1 x      PHYSICAL EXAMINATION: General:  paralyzed Neuro:  [place] HEENT:  jvd noted Cardiovascular:  s1 s2 ST no r/ m Lungs:  Coarse reduced Abdomen:  Soft, BS wnl, no  r/g Musculoskeletal:  No edema Skin:  No rash  LABS:  CBC  Recent Labs Lab 01/27/14 0415 01/28/14 0635 01/29/14 0515  WBC 13.4* 11.6* 9.3  HGB 12.8 12.2 11.4*  HCT 39.9 39.5 36.7  PLT 157 177 172   Coag's No results found for this basename: APTT, INR,  in the last 168 hours BMET  Recent Labs Lab 01/27/14 0415 01/28/14 0635 01/29/14 0515  NA 142 143 142  K 4.9 4.3 3.7  CL 106 102 101  CO2 23 26 34*  BUN 19 23 23   CREATININE 0.69 0.81 0.74  GLUCOSE 173* 184* 149*   Electrolytes  Recent Labs Lab 01/27/14 0415 01/28/14 0635 01/29/14 0515  CALCIUM 9.3 9.3 8.7   Sepsis Markers  Recent Labs Lab 01/25/14 2231  LATICACIDVEN 1.49   ABG  Recent Labs Lab 01/28/14 2300 01/29/14 0228 01/29/14 0443  PHART 7.284* 7.344* 7.354  PCO2ART 74.0* 62.8* 62.2*  PO2ART 117.0* 113.0* 115.0*   Liver Enzymes  Recent Labs Lab 01/29/14 0515  AST 14  ALT 20  ALKPHOS 78  BILITOT <0.2*  ALBUMIN 3.4*   Cardiac Enzymes  Recent Labs Lab 01/25/14 2154 01/28/14 0859  TROPONINI  --  <0.30  PROBNP 485.2*  --    Glucose  Recent Labs Lab 01/28/14 1159  GLUCAP 134*    Imaging Dg Abd 1 View  01/28/2014   CLINICAL DATA:  Orogastric tube placement  EXAM: ABDOMEN - 1 VIEW  COMPARISON:  CT abdomen pelvis 03/21/2012  FINDINGS: An enteric tube terminates in the distal body of the stomach. Mild gaseous distention of the stomach is noted. Bowel loops are nondistended. Rectal thermistor is present. Surgical clips noted in the inguinal regions bilaterally.  IMPRESSION: Enteric tube terminates in the stomach.   Electronically Signed   By: Curlene Dolphin M.D.   On: 01/28/2014 12:49   Dg Chest Port 1 View  01/29/2014   CLINICAL DATA:  Edema.  EXAM: PORTABLE CHEST - 1 VIEW  COMPARISON:  January 28, 2014.  FINDINGS: Endotracheal and nasogastric tubes are in grossly good position. By subclavian catheter line is again noted with tip in expected position of the SVC. Status post left  shoulder arthroplasty. No pneumothorax or pleural effusion is noted. Cardiomediastinal silhouette is within normal limits. Stable interstitial densities are noted in the lungs. No acute pulmonary disease is noted.  IMPRESSION: Stable support apparatus. No acute cardiopulmonary abnormality seen.   Electronically Signed   By: Sabino Dick M.D.   On: 01/29/2014 07:16   Dg Chest Port 1 View  01/28/2014   CLINICAL DATA:  Central line placement  EXAM: PORTABLE CHEST - 1 VIEW  COMPARISON:  DG CHEST 1V PORT dated 01/28/2014; DG CHEST 1V PORT dated 01/28/2014; CT ANGIO CHEST W/CM &/OR WO/CM dated 01/25/2014  FINDINGS: An NG tube has been placed in crosses the gastroesophageal junction. Right subclavian central line has been placed with tip to the cavoatrial junction. No change endotracheal tube. No pneumothorax. Mild interstitial change is stable.  IMPRESSION: Central line and NG tube with no pneumothorax.   Electronically Signed   By: Skipper Cliche M.D.   On: 01/28/2014 21:16   Dg Chest Port 1 View  01/28/2014   CLINICAL DATA:  Endotracheal tube placement  EXAM: PORTABLE CHEST - 1 VIEW  COMPARISON:  DG CHEST 1V PORT dated 01/28/2014; DG CHEST 1V PORT dated 01/25/2014; CT ANGIO CHEST W/CM &/OR WO/CM dated 01/25/2014; DG CHEST 1V PORT dated 11/22/2013  FINDINGS: Grossly unchanged cardiac silhouette and mediastinal contours with atherosclerotic plaque within the thoracic aorta. Interval intubation with endotracheal tube overlying the tracheal air column with tip approximately 3 cm above the carina. No pneumothorax. Bibasilar heterogeneous likely nodular opacities are grossly unchanged. No new focal airspace opacities. No evidence of edema. Grossly unchanged bones including sequela of left-sided hemi humeral arthroplasty, incompletely evaluated.  IMPRESSION: 1. Appropriately positioned endotracheal tube.  No pneumothorax. 2. Grossly unchanged chronic bibasilar atelectasis/scar without definite acute cardiopulmonary disease.    Electronically Signed   By: Sandi Mariscal M.D.   On: 01/28/2014 09:16   Dg Chest Port 1 View  01/28/2014   CLINICAL DATA:  Respiratory failure. History of emphysema, COPD, TIA, cardiomyopathy, left breast cancer, Coronary artery disease, hypertension, stroke, previous smoker.  EXAM: PORTABLE CHEST - 1 VIEW  COMPARISON:  01/25/2014  FINDINGS: Shallow inspiration. Normal heart size and pulmonary vascularity. Probable emphysematous changes and fibrosis in the lungs without focal airspace disease. No blunting of costophrenic angles. No pneumothorax. Postoperative changes in the left shoulder. No change since prior study.  IMPRESSION: No active disease.   Electronically Signed   By: Lucienne Capers M.D.   On: 01/28/2014 02:35     CXR: slight improved int changes, ett wnl, line wnl  ASSESSMENT / PLAN:  PULMONARY A: Acute Exacerbation COPD, Acute resp failure, r/o failure contribution Neg PE P:   abg reviewed, keep TV but ensure plat less 30 Keep  low rate as able pcxr in am  Maintain neg balance as able Interrupt continues paralysis to intermittent  CARDIOVASCULAR A: Known systolic heart failure, CAD P:  neg balance remains as goal Tele plavix  RENAL A:  Neg 1.8 liters P:   Lasix continue Chem in am  kvo k supp  GASTROINTESTINAL A:  NPO P:   Start TF  ppi  HEMATOLOGIC A:  DVT prevention, NEG PE on CT P: lovenox for now Cbc in am   INFECTIOUS A:  NO evidence PNA, infection, some change in sputum per pt on admission, recent nosocomial exposure P:   Levofloxacin continued If fever then add ceftaz, vanc  ENDOCRINE A:  Steroids, now paralysis, high risk myopathy P:   cbg Steroids maintain Dc paralysis to intermittent  NEUROLOGIC A:  Benzo dep, PTSD from last ETT, severe dyschrony P:   Maintain current regimen Attempt to intermittent vec No WUA  TODAY'S SUMMARY: to int vec, no weaning, start TF, maintain lasix  I have personally obtained a history, examined the  patient, evaluated laboratory and imaging results, formulated the assessment and plan and placed orders. CRITICAL CARE: The patient is critically ill with multiple organ systems failure and requires high complexity decision making for assessment and support, frequent evaluation and titration of therapies, application of advanced monitoring technologies and extensive interpretation of multiple databases. Critical Care Time devoted to patient care services described in this note is 40 minutes.   Lavon Paganini. Titus Mould, MD, [place] Pgr: [facility]  [facility] and [place] Pager: [phone]  01/29/2014, 8:44 AM

## 2014-01-29 NOTE — Progress Notes (Signed)
[facility] Physician-Brief Progress Note Patient Name: MAHUM BETTEN DOB: 11-13-1940 MRN: [id number]  Date of Service  01/29/2014   HPI/Events of Note   AutoPEEP VT 380 RR 16 Hx COPD   Recent Labs Lab 01/28/14 0745 01/28/14 0935 01/28/14 1816 01/28/14 2053 01/28/14 2300  PHART 7.328* 7.463* 7.335* 7.249* 7.284*  PCO2ART 52.5* 31.8* 56.1* 72.2* 74.0*  PO2ART 104.0* 350.0* 84.4 111.0* 117.0*  HCO3 26.8* 22.5 29.1* 30.5* 34.0*  TCO2 24.5 20.1 26.9 28.8 31.5  O2SAT 98.5 100.0 95.7 97.5 98.1     eICU Interventions   VT increase to 500 RR decrease to 12 ABG in 1 hour    Intervention Category Major Interventions: Respiratory failure - evaluation and management  Terrace Fontanilla 01/29/2014, 1:09 AM

## 2014-01-29 NOTE — Progress Notes (Signed)
INITIAL NUTRITION ASSESSMENT  DOCUMENTATION CODES Per approved criteria  -Not Applicable   INTERVENTION: - Advance Vital High Protein to goal rate of 30 ml/hr, with 30 ml Prostat BID. With propofol, this provides 1398 kcal, 93 g protein, 769 ml free water.  - RD will monitor propofol rate and adjust TF rate as needed to meet estimated needs.   NUTRITION DIAGNOSIS: Inadequate oral intake related to inability to eat as evidenced by NPO status  Goal: Patient will meet >/=90% of estimated nutrition needs  Monitor:  Respiratory status, TF tolerance, weight trends, labs.   Reason for Assessment: Consult, TF initiation and management  73 y.o. female  Admitting Dx: Acute on chronic respiratory failure with hypoxia  ASSESSMENT: 73 year old female with history of COPD, recent admit requiring intubation. Admitted with worsening SOB and cough.   Patient with history of difficult sedation. She is currently intubated and sedated on propofol at 18.1 ml/hr, providing 478 kcal from lipids. Per RN, will likely continue to run at this rate.   TF per Adult Enteral Nutrition Protocol, Vital High Protein just started this AM, currently running at 20 ml/hr. No weight loss per chart review.   Height: Ht Readings from Last 1 Encounters:  01/26/14 5' 4"  (1.626 m)    Weight: Wt Readings from Last 1 Encounters:  01/28/14 168 lb 10.4 oz (76.5 kg)    Ideal Body Weight: 120 pounds  % Ideal Body Weight: 140%  Wt Readings from Last 10 Encounters:  01/28/14 168 lb 10.4 oz (76.5 kg)  01/23/14 167 lb (75.751 kg)  01/04/14 163 lb 12.8 oz (74.299 kg)  12/26/13 164 lb (74.39 kg)  12/23/13 162 lb (73.483 kg)  12/09/13 163 lb 9.6 oz (74.208 kg)  11/16/13 158 lb 1.1 oz (71.7 kg)  11/08/13 165 lb (74.844 kg)  10/03/13 167 lb (75.751 kg)  08/31/13 162 lb 1.9 oz (73.537 kg)    Usual Body Weight: 165 pounds  % Usual Body Weight: 102%  BMI:  Body mass index is 28.83 kg/(m^2)  Estimated Nutritional  Needs: Kcal: 1482 kcal Protein: 90-100 g Fluid: 2.2-2.3 L/day  Skin: Intact  Diet Order: NPO  EDUCATION NEEDS: -No education needs identified at this time   Intake/Output Summary (Last 24 hours) at 01/29/14 1200 Last data filed at 01/29/14 1100  Gross per 24 hour  Intake 1732.4 ml  Output   3395 ml  Net -1662.6 ml    Last BM: 4/10   Labs:   Recent Labs Lab 01/27/14 0415 01/28/14 0635 01/29/14 0515  NA 142 143 142  K 4.9 4.3 3.7  CL 106 102 101  CO2 23 26 34*  BUN 19 23 23   CREATININE 0.69 0.81 0.74  CALCIUM 9.3 9.3 8.7  GLUCOSE 173* 184* 149*    CBG (last 3)   Recent Labs  01/28/14 1159  GLUCAP 134*    Scheduled Meds: . anastrozole  1 mg Oral QHS  . antiseptic oral rinse  15 mL Mouth Rinse q12n4p  . artificial tears  1 application Both Eyes 3 times per day  . aspirin  81 mg Oral QHS  . chlorhexidine  15 mL Mouth Rinse BID  . clopidogrel  75 mg Oral Daily  . docusate  100 mg Oral Daily  . enoxaparin (LOVENOX) injection  40 mg Subcutaneous Q24H  . feeding supplement (VITAL HIGH PROTEIN)  1,000 mL Per Tube Q24H  . furosemide  40 mg Intravenous Q12H  . [START ON 01/30/2014] furosemide  20 mg  Oral Daily  . gabapentin  300 mg Oral QHS  . ipratropium-albuterol  3 mL Nebulization Q4H  . levofloxacin  750 mg Oral Daily  . methylPREDNISolone (SOLU-MEDROL) injection  80 mg Intravenous 3 times per day  . metoprolol tartrate  12.5 mg Oral QHS  . multivitamin with minerals  1 tablet Oral Daily  . nystatin  5 mL Oral BID  . oxybutynin  5 mg Oral QHS  . pantoprazole sodium  40 mg Per Tube BID  . polyethylene glycol  17 g Oral Daily  . potassium chloride SA  20 mEq Oral Daily  . simvastatin  40 mg Oral q1800  . sodium chloride  3 mL Intravenous Q12H    Continuous Infusions: . midazolam (VERSED) infusion 2 mg/hr (01/29/14 1100)  . propofol 40 mcg/kg/min (01/29/14 1100)    Past Medical History  Diagnosis Date  . Other emphysema   . Unspecified  arthropathy, shoulder region   . Gastric ulcer, unspecified as acute or chronic, without mention of hemorrhage, perforation, or obstruction   . Benign paroxysmal positional vertigo   . COPD (chronic obstructive pulmonary disease)   . Diverticula of colon   . Osteopenia   . Personal history of colonic polyps 2007    tubular adenoma high grade dysplasia  . Diverticulitis   . Hyperlipemia   . Fracture of ankle, bimalleolar, right, closed   . Hypoxemia   . C. difficile diarrhea   . Varicose vein   . Asthma   . GERD (gastroesophageal reflux disease)   . Hepatitis 1989  . Arthritis     "both shoulders"   . TIA (transient ischemic attack) 04/2008; 08/21/2012    "slight droop left lower lip after 08/21/12 TIA"  . On home oxygen therapy   . Ischemic cardiomyopathy     a. EF 45-50% 2011. b. EF 35-40% by cath 09/01/12.  . Night terrors, adult     dr Leonie Man referred  for PSG in 2011, and again in 2013   . Breast cancer 04/08/13 bx    left  . Allergy   . CAD (coronary artery disease)     a. ant MI 64 tx with TPA/PTCA. b. Prior stenting procedures including RCA stent, DES to LCx 04/2011. c. [place] -> DES  to prox LAD 08/2012.   [facility] Kitchen Hypertension     TAKES FOR H/O STROKE  . CVA (cerebral vascular accident)   . Stroke, acute, embolic     Nov 8756 , [facility] , was treated with TPA.   . 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 Path G1 IDC, 1.6 cm, neg margin, neg SLN     Past Surgical History  Procedure Laterality Date  . Total shoulder arthroplasty  2012    left  . Orif ankle fracture  2007    right  . Martial megeti  4332'R    "bladder operation; not successful " (08/31/2012)  . Varicose vein surgery  1983; 1985    "both legs both times" (08/31/2012)  . Total shoulder replacement  12/05/2010    Dr Onnie Graham  . Abdominal hysterectomy      TAH w/BSO  . Augmentation  mammaplasty  1975  . Coronary angioplasty with stent placement  2010  . Coronary angioplasty  1990    "3 times" (08/31/2012)  . Colon surgery  07/07/2011    Dr. Hassell Done  .  Appendectomy  1960  . Nose surgery      1977  . Breast augmenttation    . Catarct  right      RIGHT EYE  . Breast lumpectomy with needle localization and axillary sentinel lymph node bx Left 06/02/2013    Procedure: BREAST LUMPECTOMY WITH NEEDLE LOCALIZATION AND AXILLARY SENTINEL LYMPH NODE BX;  Surgeon: Haywood Lasso, MD;  Location: [facility];  Service: General;  Laterality: Left;  NEEDLE LOC BCG 7:30 NUC MED 9:30 RECONSTRUCTION TO FOLLOW 1 HOUR added   . Breast implant removal Left 06/02/2013    Procedure: REMOVAL BREAST IMPLANTS; LEFT BREAST CAPSULECTOMY WITH PLACEMENT OF TISSUE EXPANDER AND USE OF FLEX HD;  Surgeon: Crissie Reese, MD;  Location: [facility];  Service: Plastics;  Laterality: Left;    Larey Seat, RD, LDN Pager #: [phone] After-Hours Pager #: [phone]

## 2014-01-29 NOTE — Progress Notes (Signed)
RT called eLink for patients panic CO2. MD changed VT from 380 to 500 . Rate changed from 16 to 12. RT will get ABG an hour from now. RT will monitor

## 2014-01-30 ENCOUNTER — Inpatient Hospital Stay (HOSPITAL_COMMUNITY): Payer: Medicare Other

## 2014-01-30 LAB — GLUCOSE, CAPILLARY
GLUCOSE-CAPILLARY: 172 mg/dL — AB (ref 70–99)
GLUCOSE-CAPILLARY: 96 mg/dL (ref 70–99)
GLUCOSE-CAPILLARY: 140 mg/dL — AB (ref 70–99)
Glucose-Capillary: 130 mg/dL — ABNORMAL HIGH (ref 70–99)
Glucose-Capillary: 144 mg/dL — ABNORMAL HIGH (ref 70–99)
Glucose-Capillary: 159 mg/dL — ABNORMAL HIGH (ref 70–99)
Glucose-Capillary: 171 mg/dL — ABNORMAL HIGH (ref 70–99)

## 2014-01-30 LAB — BLOOD GAS, ARTERIAL
Acid-Base Excess: 8.7 mmol/L — ABNORMAL HIGH (ref 0.0–2.0)
Bicarbonate: 35.4 mEq/L — ABNORMAL HIGH (ref 20.0–24.0)
Drawn by: 11249
FIO2: 0.4 %
O2 SAT: 98.3 %
PATIENT TEMPERATURE: 37
PEEP/CPAP: 5 cmH2O
RATE: 12 resp/min
TCO2: 32.3 mmol/L (ref 0–100)
VT: 500 mL
pCO2 arterial: 61.5 mmHg (ref 35.0–45.0)
pH, Arterial: 7.378 (ref 7.350–7.450)
pO2, Arterial: 110 mmHg — ABNORMAL HIGH (ref 80.0–100.0)

## 2014-01-30 LAB — CBC WITH DIFFERENTIAL/PLATELET
BASOS ABS: 0 10*3/uL (ref 0.0–0.1)
BASOS PCT: 0 % (ref 0–1)
Eosinophils Absolute: 0 10*3/uL (ref 0.0–0.7)
Eosinophils Relative: 0 % (ref 0–5)
HCT: 37.4 % (ref 36.0–46.0)
Hemoglobin: 11.5 g/dL — ABNORMAL LOW (ref 12.0–15.0)
LYMPHS PCT: 12 % (ref 12–46)
Lymphs Abs: 1 10*3/uL (ref 0.7–4.0)
MCH: 27.2 pg (ref 26.0–34.0)
MCHC: 30.7 g/dL (ref 30.0–36.0)
MCV: 88.4 fL (ref 78.0–100.0)
MONOS PCT: 5 % (ref 3–12)
Monocytes Absolute: 0.4 10*3/uL (ref 0.1–1.0)
NEUTROS ABS: 6.7 10*3/uL (ref 1.7–7.7)
NEUTROS PCT: 83 % — AB (ref 43–77)
Platelets: 162 10*3/uL (ref 150–400)
RBC: 4.23 MIL/uL (ref 3.87–5.11)
RDW: 15.3 % (ref 11.5–15.5)
WBC: 8.1 10*3/uL (ref 4.0–10.5)

## 2014-01-30 MED ORDER — METHYLPREDNISOLONE SODIUM SUCC 125 MG IJ SOLR
80.0000 mg | Freq: Two times a day (BID) | INTRAMUSCULAR | Status: DC
  Administered 2014-01-30 – 2014-02-01 (×4): 80 mg via INTRAVENOUS
  Filled 2014-01-30 (×2): qty 1.28
  Filled 2014-01-30: qty 2
  Filled 2014-01-30 (×4): qty 1.28

## 2014-01-30 MED ORDER — BUSPIRONE HCL 15 MG PO TABS
15.0000 mg | ORAL_TABLET | Freq: Two times a day (BID) | ORAL | Status: DC
  Administered 2014-01-30 – 2014-01-31 (×4): 15 mg
  Filled 2014-01-30 (×9): qty 1

## 2014-01-30 MED ORDER — VANCOMYCIN HCL 10 G IV SOLR
2000.0000 mg | Freq: Once | INTRAVENOUS | Status: AC
  Administered 2014-01-30: 2000 mg via INTRAVENOUS
  Filled 2014-01-30: qty 2000

## 2014-01-30 MED ORDER — VITAL AF 1.2 CAL PO LIQD
1000.0000 mL | ORAL | Status: DC
  Administered 2014-01-30: 1000 mL
  Filled 2014-01-30 (×4): qty 1000

## 2014-01-30 MED ORDER — CLONAZEPAM 0.5 MG PO TABS
0.5000 mg | ORAL_TABLET | Freq: Two times a day (BID) | ORAL | Status: DC
  Administered 2014-01-30 (×2): 0.5 mg
  Filled 2014-01-30 (×4): qty 1

## 2014-01-30 MED ORDER — POTASSIUM CHLORIDE 20 MEQ/15ML (10%) PO LIQD
40.0000 meq | ORAL | Status: AC
  Administered 2014-01-30 (×2): 40 meq
  Filled 2014-01-30 (×2): qty 30

## 2014-01-30 MED ORDER — VANCOMYCIN HCL IN DEXTROSE 1-5 GM/200ML-% IV SOLN
1000.0000 mg | Freq: Two times a day (BID) | INTRAVENOUS | Status: DC
  Administered 2014-01-31 (×2): 1000 mg via INTRAVENOUS
  Filled 2014-01-30 (×2): qty 200

## 2014-01-30 NOTE — Progress Notes (Signed)
Name: [name] MRN: [id number] DOB: [birth year]    ADMISSION DATE:  01/25/2014 CONSULTATION DATE:  01/28/14  REFERRING MD :  Dr Jerilee Hoh PRIMARY SERVICE: [facility]>>>PCCM  CHIEF COMPLAINT:  Resp failure  BRIEF PATIENT DESCRIPTION:  73 yr old COPD (RA pt in office, recent admission requiring ETT)admitted SOB, now in distress , NIMV failed -ETT   SIGNIFICANT EVENTS / STUDIES:  4/9 - admission SOB 4/9 CT - Negative for pulmonary embolism.<BR>2. Remote left ventricular apex infarct.<BR>3. Emphysema.<BR>4. 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 Correlate with left upper extremity symptoms 4/10 - declined requiring NIMV 4/11- severe dyschrony, paralyzed  LINES / TUBES: 4/11 ETT>>> 4/11 [place]>>> 4/10 foley>>>  CULTURES: 4/8 BC>>> 4/12 sputum>>>abundant SA ............ 4/13 - REsp Virus panel (staff MD note) >>  ANTIBIOTICS: Levofloxacin 4/9>>> vanc 4/13>>>  SUBJECTIVE: Off NMB, but restless and anxious  VITAL SIGNS: Temp:  [97.2 F (36.2 C)-101.7 F (38.7 C)] 97.7 F (36.5 C) (04/13 0800) Pulse Rate:  [76-119] 109 (04/13 0838) Resp:  [12-26] 23 (04/13 0838) BP: (91-164)/(26-62) 145/59 mmHg (04/13 0800) SpO2:  [93 %-98 %] 98 % (04/13 0800) FiO2 (%):  [40 %] 40 % (04/13 0838) Weight:  [73.9 kg (162 lb 14.7 oz)-80.7 kg (177 lb 14.6 oz)] 73.9 kg (162 lb 14.7 oz) (04/13 0327) HEMODYNAMICS:   VENTILATOR SETTINGS: Vent Mode:  [-] PSV;CPAP FiO2 (%):  [40 %] 40 % Set Rate:  [12 bmp] 12 bmp Vt Set:  [500 mL] 500 mL PEEP:  [5 cmH20] 5 cmH20 Pressure Support:  [5 cmH20] 5 cmH20 Plateau Pressure:  [14 cmH20-21 cmH20] 14 cmH20 INTAKE / OUTPUT: Intake/Output     04/12 0701 - 04/13 0700 04/13 0701 - 04/14 0700   I.V. (mL/kg) 2076.6 (28.1) 75.9 (1)   NG/GT 710 130   Total Intake(mL/kg) 2786.6 (37.7) 205.9 (2.8)   Urine (mL/kg/hr) 2480 (1.4) 480 (1.5)   Total Output 2480 480   Net +306.6 -274.1          PHYSICAL EXAMINATION: General:   Awake, agitated, wants off vent but still has sig WOB HEENT:  jvd absent cardiovascular:  s1 s2 ST no r/ m Lungs:  Prolonged exp wheeze  Abdomen:  Soft, BS wnl, no r/g Musculoskeletal:  No edema Skin:  No rash  LABS:  PULMONARY  Recent Labs Lab 01/28/14 2053 01/28/14 2300 01/29/14 0228 01/29/14 0443 01/30/14 0501  PHART 7.249* 7.284* 7.344* 7.354 7.378  PCO2ART 72.2* 74.0* 62.8* 62.2* 61.5*  PO2ART 111.0* 117.0* 113.0* 115.0* 110.0*  HCO3 30.5* 34.0* 33.3* 33.8* 35.4*  TCO2 28.8 31.5 30.5 31.0 32.3  O2SAT 97.5 98.1 98.4 98.6 98.3    CBC  Recent Labs Lab 01/28/14 0635 01/29/14 0515 01/30/14 0530  HGB 12.2 11.4* 11.5*  HCT 39.5 36.7 37.4  WBC 11.6* 9.3 8.1  PLT 177 172 162    COAGULATION No results found for this basename: INR,  in the last 168 hours  CARDIAC   Recent Labs Lab 01/28/14 0859  TROPONINI <0.30    Recent Labs Lab 01/25/14 2154  PROBNP 485.2*     CHEMISTRY  Recent Labs Lab 01/26/14 0334 01/27/14 0415 01/28/14 0635 01/29/14 0515 01/30/14 0530  NA 139 142 143 142 145  K 3.7 4.9 4.3 3.7 3.0*  CL 103 106 102 101 101  CO2 20 23 26  34* 36*  GLUCOSE 197* 173* 184* 149* 191*  BUN 14 19 23 23  35*  CREATININE 0.80 0.69 0.81 0.74 0.76  CALCIUM 8.3* 9.3  9.3 8.7 8.8   Estimated Creatinine Clearance: 58.4 ml/min (by C-G formula based on Cr of 0.76).   LIVER  Recent Labs Lab 01/29/14 0515 01/30/14 0530  AST 14 16  ALT 20 19  ALKPHOS 78 74  BILITOT <0.2* <0.2*  PROT 6.3 6.0  ALBUMIN 3.4* 3.1*     INFECTIOUS  Recent Labs Lab 01/25/14 2231  LATICACIDVEN 1.49     ENDOCRINE CBG (last 3)   Recent Labs  01/30/14 0021 01/30/14 0259 01/30/14 0818  GLUCAP 159* 171* 172*         IMAGING x48h  Dg Abd 1 View  01/28/2014   CLINICAL DATA:  Orogastric tube placement  EXAM: ABDOMEN - 1 VIEW  COMPARISON:  CT abdomen pelvis 03/21/2012  FINDINGS: An enteric tube terminates in the distal body of the stomach. Mild gaseous  distention of the stomach is noted. Bowel loops are nondistended. Rectal thermistor is present. Surgical clips noted in the inguinal regions bilaterally.  IMPRESSION: Enteric tube terminates in the stomach.   Electronically Signed   By: Curlene Dolphin M.D.   On: 01/28/2014 12:49   Dg Chest Port 1 View  01/30/2014   CLINICAL DATA:  Acute respiratory failure. On ventilator. COPD and coronary artery disease.  EXAM: PORTABLE CHEST - 1 VIEW  COMPARISON:  01/29/2014  FINDINGS: Endotracheal tube, nasogastric tube, and right subclavian central venous catheter remain in appropriate position. No pneumothorax identified. Both lungs are clear. Heart size is within normal limits. Left shoulder prosthesis again noted.  IMPRESSION: Support lines and tubes in appropriate position.  No acute findings.   Electronically Signed   By: Earle Gell M.D.   On: 01/30/2014 07:11   Dg Chest Port 1 View  01/29/2014   CLINICAL DATA:  Edema.  EXAM: PORTABLE CHEST - 1 VIEW  COMPARISON:  January 28, 2014.  FINDINGS: Endotracheal and nasogastric tubes are in grossly good position. By subclavian catheter line is again noted with tip in expected position of the SVC. Status post left shoulder arthroplasty. No pneumothorax or pleural effusion is noted. Cardiomediastinal silhouette is within normal limits. Stable interstitial densities are noted in the lungs. No acute pulmonary disease is noted.  IMPRESSION: Stable support apparatus. No acute cardiopulmonary abnormality seen.   Electronically Signed   By: Sabino Dick M.D.   On: 01/29/2014 07:16   Dg Chest Port 1 View  01/28/2014   CLINICAL DATA:  Central line placement  EXAM: PORTABLE CHEST - 1 VIEW  COMPARISON:  DG CHEST 1V PORT dated 01/28/2014; DG CHEST 1V PORT dated 01/28/2014; CT ANGIO CHEST W/CM &/OR WO/CM dated 01/25/2014  FINDINGS: An NG tube has been placed in crosses the gastroesophageal junction. Right subclavian central line has been placed with tip to the cavoatrial junction. No change  endotracheal tube. No pneumothorax. Mild interstitial change is stable.  IMPRESSION: Central line and NG tube with no pneumothorax.   Electronically Signed   By: Skipper Cliche M.D.   On: 01/28/2014 21:16       CXR: clear, ETT in good position  ASSESSMENT / PLAN:  PULMONARY A: Acute Exacerbation COPD, Acute resp failure, r/o failure contribution (admitted end Jan 2015 on vent with Flu an and MSSA pna) Neg PE Still w/ sig bronchospasm on 4/13. Not ready for extubation  P:   Resume full vent support  Repeat abg in am  Keep even volume status (2 more doses lasix) Cont systemic steroids Cont bronchodilators  Sedation protocol  See ID section  CARDIOVASCULAR A: Known systolic heart failure, CAD P:  neg balance remains as goal Tele plavix  RENAL A: hypokalemia  P: Replace K  Keep even  F/u chemistry   GASTROINTESTINAL A: no acute  Start TF  ppi  HEMATOLOGIC A:  DVT prevention, NEG PE on CT P:  lovenox for now Cbc in am   INFECTIOUS A:  NO evidence PNA      SA purulent bronchitis (suspect MRSA) P:   Levofloxacin continued Add vanc   ENDOCRINE A:  Steroids, now paralysis, high risk myopathy, now off  P:   cbg Steroids maintain   NEUROLOGIC A:   Benzo dep, PTSD from last ETT, severe dyschrony P:   Maintain current regimen Add back her klonopin and buspar  Cont diprivan, failed precedex trials last time, so will not try this hospitalization    TODAY'S NP  SUMMARY:  Still bronchospastic. Volume status appears euvolemic. Not ready to extubate. Have discussed plan of care at length w/ her daughter Caryl Pina. Recall she had a difficult time w/ agitation during last admit, did not respond to precedex. Will resume her meds that were started in January. Cont current diprivan gtt. Hope she will be closer to extubation in next 24-48hrs.    STAFF NOTE: I, Dr Ann Lions have personally reviewed patient's available data, including medical history, events of note,  physical examination and test results as part of my evaluation. I have discussed with resident/NP and other care providers such as pharmacist, RN and RRT.  In addition,  I personally evaluated patient and elicited key findings of - acute resp failuer. Likely MSSA again. Poor prognosis now this is 2nd intubation. Will check resp virus panel.  Rest per NP/medical resident whose note is outlined above and that I agree with  The patient is critically ill with multiple organ systems failure and requires high complexity decision making for assessment and support, frequent evaluation and titration of therapies, application of advanced monitoring technologies and extensive interpretation of multiple databases.   Critical Care Time devoted to patient care services described in this note is  35  Minutes.  Dr. Brand Males, M.D., [facility].C.P Pulmonary and Critical Care Medicine Staff Physician [facility] Pager: [phone], If no answer or between  15:00h - 7:00h: call [phone]  01/30/2014 12:18 PM

## 2014-01-30 NOTE — Progress Notes (Signed)
OT Cancellation Note  Patient Details Name: [name] MRN: [id number] DOB: 10-Jan-1941   Cancelled Treatment:   Patient discharged from OT services secondary to medical decline - will need to re-order OT to resume therapy services. Kari Baars, OT   [place] D Artist Bloom 01/30/2014, 2:05 PM

## 2014-01-30 NOTE — Progress Notes (Signed)
Physical Therapy Discharge Patient Details Name: [name] MRN: [id number] DOB: 1941-02-14 Today's Date: 01/30/2014 Time:  -     Patient discharged from PT services secondary to medical decline - will need to re-order PT to resume therapy services.  Please see latest therapy progress note for current level of functioning and progress toward goals.       GP     [place] PT [phone]  01/30/2014, 7:28 AM

## 2014-01-30 NOTE — Progress Notes (Signed)
CARE MANAGEMENT NOTE 01/30/2014  Patient:  [name], [name]   Account Number:  [id number]  Date Initiated:  01/26/2014  Documentation initiated by:  Mellie Buccellato  Subjective/Objective Assessment:   pt with hx of copd  now with excerbation.  abnormal abg's     Action/Plan:   recent discharge-[id number] from [facility] for resp rehab.   Anticipated DC Date:  02/02/2014   Anticipated DC Plan:  SKILLED NURSING FACILITY  In-house referral  Clinical Social Worker      DC Planning Services  NA      [facility] Choice  NA   Choice offered to / List presented to:  NA   DME arranged  NA      DME agency  NA     [place] arranged  NA      [place] agency  NA   Status of service:  In process, will continue to follow Medicare Important Message given?  NA - LOS <3 / Initial given by admissions (If response is "NO", the following Medicare IM given date fields will be blank) Date Medicare IM given:   Date Additional Medicare IM given:    Discharge Disposition:    Per UR Regulation:  Reviewed for med. necessity/level of care/duration of stay  If discussed at [place] of Stay Meetings, dates discussed:    Comments:  [id number]/Alpa Salvo Rosana Hoes RN, BSN, [place], [phone] Chart reviewed for update of needs and condition./ [id number]/pklaced on bipap, and required intubation later in day on [id number]/onvent day 2/ icu status.  [id number]/NIDPOE Rosana Hoes, RN, BSN, CCM [phone] Chart Reviewed for discharge and hospital needs. Discharge needs at time of review: None present will follow for needs. Review of patient progress due on [id number].

## 2014-01-30 NOTE — Progress Notes (Signed)
NUTRITION FOLLOW UP  Intervention:   - D/C Prostat 51m BID - Change TF to Vital AF 1.2 via OGT start at 32mhr increase by 1038mvery 4 hours to goal rate of 80m23m. Goal rate provides 1584 calories, 99g protein. TF plus calories provide by current Propofol rate will provide 1763 calories and meet 100% of estimated calorie and protein needs - RD to continue to monitor propofol rate and adjust TF rate as needed to meet estimated needs   Nutrition Dx:   Inadequate oral intake related to inability to eat as evidenced by NPO status - ongoing   Goal:   Patient will meet >/=90% of estimated nutrition needs - not met, but will meet with TF advancement to goal   Monitor:   Weights, labs, TF tolerance/advancement, vent status  Assessment:   72 y22r old female with history of COPD, recent admit requiring intubation. Admitted with worsening SOB and cough.   Patient with history of difficult sedation.   4/12 - She is currently intubated and sedated on propofol at 18.1 ml/hr, providing 478 kcal from lipids. Per RN, will likely continue to run at this rate. TF per Adult Enteral Nutrition Protocol, Vital High Protein just started this AM, currently running at 20 ml/hr. No weight loss per chart review.   4/13 - Per RN, pt tolerating TF well without any residuals. Remains intubated.   Current TF: Vital High Protein at 30ml10mwith Prostat 30ml 40mwhich provides 920 calories, 93g protein (meets 52% estimated calorie needs, 100% estimated protein needs)  Current TF plus calories from current Propofol rate provide 1099 calories, 93g protein - meeting 62% estimated calorie needs   Potassium low, getting liquid replacement via OGT  Patient is currently intubated on ventilator support MV: 9.7 L/min Temp (24hrs), Avg:99.2 F (37.3 C), Min:97.2 F (36.2 C), Max:101.7 F (38.7 C)  Propofol: 6.8 ml/hr provides 179 calories    Height:  Ht Readings from Last 1 Encounters:   01/26/14  _0  (1.626  m)      Weight Status:   Wt Readings from Last 1 Encounters:  01/30/14 162 lb 14.7 oz (73.9 kg)  Admit wt:        167 lb 1.7 oz (75.8 kg)    Re-estimated needs:  Kcal: 1760 Protein: 90-100g Fluid: 1.7L/day  Skin: +1 generalized edema, +1 RUE, LUE edema  Diet Order: NPO   Intake/Output Summary (Last 24 hours) at 01/30/14 1554 Last data filed at 01/30/14 1500  Gross per 24 hour  Intake 2880.33 ml  Output   2865 ml  Net  15.33 ml    Last BM: 4/8   Labs:   Recent Labs Lab 01/28/14 0635 01/29/14 0515 01/30/14 0530  NA 143 142 145  K 4.3 3.7 3.0*  CL 102 101 101  CO2 26 34* 36*  BUN 23 23 35*  CREATININE 0.81 0.74 0.76  CALCIUM 9.3 8.7 8.8  GLUCOSE 184* 149* 191*    CBG (last 3)   Recent Labs  01/30/14 0259 01/30/14 0818 01/30/14 1225  GLUCAP 171* 172* 144*    Scheduled Meds: . anastrozole  1 mg Oral QHS  . antiseptic oral rinse  15 mL Mouth Rinse q12n4p  . artificial tears  1 application Both Eyes 3 times per day  . aspirin  81 mg Oral QHS  . busPIRone  15 mg Per Tube BID  . chlorhexidine  15 mL Mouth Rinse BID  . clonazePAM  0.5 mg Per Tube Q12H  .  clopidogrel  75 mg Oral Daily  . docusate  100 mg Oral Daily  . enoxaparin (LOVENOX) injection  40 mg Subcutaneous Q24H  . feeding supplement (PRO-STAT SUGAR FREE 64)  30 mL Per Tube BID  . feeding supplement (VITAL HIGH PROTEIN)  1,000 mL Per Tube Q24H  . gabapentin  300 mg Oral QHS  . ipratropium-albuterol  3 mL Nebulization Q4H  . methylPREDNISolone (SOLU-MEDROL) injection  80 mg Intravenous Q12H  . metoprolol tartrate  12.5 mg Oral QHS  . multivitamin with minerals  1 tablet Oral Daily  . nystatin  5 mL Oral BID  . oxybutynin  5 mg Oral QHS  . pantoprazole sodium  40 mg Per Tube BID  . polyethylene glycol  17 g Oral Daily  . potassium chloride  40 mEq Per Tube Q4H  . potassium chloride SA  20 mEq Oral Daily  . simvastatin  40 mg Oral q1800  . [START ON 01/31/2014] vancomycin  1,000 mg  Intravenous Q12H    Continuous Infusions: . midazolam (VERSED) infusion Stopped (01/30/14 0750)  . propofol 20 mcg/kg/min (01/30/14 1543)    Mikey College MS, RD, LDN [phone] Pager [phone] After Hours Pager

## 2014-01-30 NOTE — Progress Notes (Signed)
ANTIBIOTIC CONSULT NOTE - INITIAL  Pharmacy Consult for vancomycin Indication: pneumonia  Allergies  Allergen Reactions  . Escitalopram Oxalate Shortness Of Breath, Diarrhea and Nausea And Vomiting    Hot flashes, dizziness  . Indomethacin Other (See Comments)    "made me very very dizzy and made me have vertigo" (08/31/2012)  . Nsaids     Jerking, hot flashes, nausea  . Codeine Nausea And Vomiting    "I can take codeine in my cough medicine" (08/31/2012)  . Sulfonamide Derivatives Other (See Comments)    Drug induced hepatitis in 1989    Patient Measurements: Height: 5' 1"  (154.9 cm) Weight: 162 lb 14.7 oz (73.9 kg) IBW/kg (Calculated) : 47.8 Adjusted Body Weight: 58kg  Vital Signs: Temp: 99 F (37.2 C) (04/13 1000) Temp src: Core (Comment) (04/13 0800) BP: 174/83 mmHg (04/13 1000) Pulse Rate: 119 (04/13 1000) Intake/Output from previous day: 04/12 0701 - 04/13 0700 In: 2786.6 [I.V.:2076.6; NG/GT:710] Out: 2480 [Urine:2480] Intake/Output from this shift: Total I/O In: 205.9 [I.V.:75.9; NG/GT:130] Out: 480 [Urine:480]  Labs:  Recent Labs  01/28/14 0635 01/29/14 0515 01/30/14 0530  WBC 11.6* 9.3 8.1  HGB 12.2 11.4* 11.5*  PLT 177 172 162  CREATININE 0.81 0.74 0.76   Medical History: Past Medical History  Diagnosis Date  . Other emphysema   . Unspecified arthropathy, shoulder region   . Gastric ulcer, unspecified as acute or chronic, without mention of hemorrhage, perforation, or obstruction   . Benign paroxysmal positional vertigo   . COPD (chronic obstructive pulmonary disease)   . Diverticula of colon   . Osteopenia   . Personal history of colonic polyps 2007    tubular adenoma high grade dysplasia  . Diverticulitis   . Hyperlipemia   . Fracture of ankle, bimalleolar, right, closed   . Hypoxemia   . C. difficile diarrhea   . Varicose vein   . Asthma   . GERD (gastroesophageal reflux disease)   . Hepatitis 1989  . Arthritis     "both  shoulders"   . TIA (transient ischemic attack) 04/2008; 08/21/2012    "slight droop left lower lip after 08/21/12 TIA"  . On home oxygen therapy   . Ischemic cardiomyopathy     a. EF 45-50% 2011. b. EF 35-40% by cath 09/01/12.  . Night terrors, adult     dr Leonie Man referred  for PSG in 2011, and again in 2013   . Breast cancer 04/08/13 bx    left  . Allergy   . CAD (coronary artery disease)     a. ant MI 53 tx with TPA/PTCA. b. Prior stenting procedures including RCA stent, DES to LCx 04/2011. c. [place] -> DES  to prox LAD 08/2012.   Marland Kitchen Hypertension     TAKES FOR H/O STROKE  . CVA (cerebral vascular accident)   . Stroke, acute, embolic     Nov 7681 , [place]  [facility] , was treated with TPA.   . 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 Path G1 IDC, 1.6 cm, neg margin, neg SLN     Medications:  Scheduled:  . anastrozole  1 mg Oral QHS  . antiseptic oral rinse  15 mL Mouth Rinse q12n4p  . artificial tears  1 application Both Eyes 3 times per day  . aspirin  81 mg Oral QHS  . busPIRone  15 mg Per  Tube BID  . chlorhexidine  15 mL Mouth Rinse BID  . clonazePAM  0.5 mg Per Tube Q12H  . clopidogrel  75 mg Oral Daily  . docusate  100 mg Oral Daily  . enoxaparin (LOVENOX) injection  40 mg Subcutaneous Q24H  . feeding supplement (PRO-STAT SUGAR FREE 64)  30 mL Per Tube BID  . feeding supplement (VITAL HIGH PROTEIN)  1,000 mL Per Tube Q24H  . gabapentin  300 mg Oral QHS  . ipratropium-albuterol  3 mL Nebulization Q4H  . levofloxacin  750 mg Oral Daily  . methylPREDNISolone (SOLU-MEDROL) injection  80 mg Intravenous Q12H  . metoprolol tartrate  12.5 mg Oral QHS  . multivitamin with minerals  1 tablet Oral Daily  . nystatin  5 mL Oral BID  . oxybutynin  5 mg Oral QHS  . pantoprazole sodium  40 mg Per Tube BID  . polyethylene glycol  17 g Oral Daily  . potassium chloride   40 mEq Per Tube Q4H  . potassium chloride SA  20 mEq Oral Daily  . simvastatin  40 mg Oral q1800  . vancomycin  2,000 mg Intravenous Once   Infusions:  . midazolam (VERSED) infusion Stopped (01/30/14 0750)  . propofol 20 mcg/kg/min (01/30/14 1149)   Assessment: 12 yoF admitted 4/9 with worsening SOB. Pt with Hx COPD on home O2 recently discharged from rehab facility on 4/1 after hospitalization in Jan requiring intubation for COPD exacerbation. Patient noted to have recent 10 day course of azithromycin in March. Pharmacy was originally consulted on admission for vancomycin and Zosyn for suspected pneumonia and antibiotics were narrowed on 4/10 to levofloxacin for COPD exacerbation versus pneumonia. Pharmacy has now been reconsulted to dose vancomycin for pneumonia with Staph aureus seen on sputum culture. Patient remains intubated in the the ICU  Antiinfectives 4/9 >> vancomycin >> 4/9 4/13 >> **restart vancomycin >> 4/9 >> zosyn >> 4/9 4/9 >> Levaquin >> (4/13, 5 day tx)  Labs / vitals Tmax: 101.7 WBCs: WNL (83% neutrophils) Renal: SCr 0.76 (baseline ~0.8), CrCl 58 ml/min CG, 72 ml/min normlaized, UOP great  Microbiology 4/8 blood: ngtd 4/9 Flu PCR: (-) 4/12 tracheal aspirate: abundant S aureus, pending 4/13 Resp virus panel: ordered   Goal of Therapy:  Vancomycin trough level 15-20 mcg/ml  Plan:  - vancomycin 2g IV x1 as a loading dose - vancomycin 1g IV q12h to start 4/14 at 0200 - vancomycin trough at steady state if indicated - follow-up clinical course, culture results, renal function - follow-up antibiotic de-escalation and length of therapy  Thank you for the consult.  Johny Drilling, PharmD, BCPS Pager: [phone] Pharmacy: [phone] 01/30/2014 12:23 PM

## 2014-01-31 LAB — GLUCOSE, CAPILLARY
GLUCOSE-CAPILLARY: 130 mg/dL — AB (ref 70–99)
GLUCOSE-CAPILLARY: 160 mg/dL — AB (ref 70–99)
Glucose-Capillary: 125 mg/dL — ABNORMAL HIGH (ref 70–99)
Glucose-Capillary: 153 mg/dL — ABNORMAL HIGH (ref 70–99)

## 2014-01-31 LAB — COMPREHENSIVE METABOLIC PANEL
ALT: 19 U/L (ref 0–35)
ALT: 26 U/L (ref 0–35)
AST: 16 U/L (ref 0–37)
AST: 19 U/L (ref 0–37)
Albumin: 3.1 g/dL — ABNORMAL LOW (ref 3.5–5.2)
Albumin: 2.7 g/dL — ABNORMAL LOW (ref 3.5–5.2)
Alkaline Phosphatase: 74 U/L (ref 39–117)
Alkaline Phosphatase: 65 U/L (ref 39–117)
BUN: 35 mg/dL — AB (ref 6–23)
BUN: 30 mg/dL — AB (ref 6–23)
CALCIUM: 8.8 mg/dL (ref 8.4–10.5)
CO2: 36 meq/L — AB (ref 19–32)
CO2: 34 mEq/L — ABNORMAL HIGH (ref 19–32)
Calcium: 8.6 mg/dL (ref 8.4–10.5)
Chloride: 101 mEq/L (ref 96–112)
Chloride: 106 mEq/L (ref 96–112)
Creatinine, Ser: 0.76 mg/dL (ref 0.50–1.10)
Creatinine, Ser: 0.63 mg/dL (ref 0.50–1.10)
GFR calc non Af Amer: 87 mL/min — ABNORMAL LOW (ref >90)
GFR, EST NON AFRICAN AMERICAN: 82 mL/min — AB (ref >90)
GLUCOSE: 133 mg/dL — AB (ref 70–99)
Glucose, Bld: 191 mg/dL — ABNORMAL HIGH (ref 70–99)
POTASSIUM: 4.1 meq/L (ref 3.7–5.3)
Potassium: 3 mEq/L — ABNORMAL LOW (ref 3.7–5.3)
SODIUM: 145 meq/L (ref 137–147)
Sodium: 146 mEq/L (ref 137–147)
Total Bilirubin: <0.2 mg/dL — ABNORMAL LOW (ref 0.3–1.2)
Total Bilirubin: 0.2 mg/dL — ABNORMAL LOW (ref 0.3–1.2)
Total Protein: 6 g/dL (ref 6.0–8.3)
Total Protein: 5.3 g/dL — ABNORMAL LOW (ref 6.0–8.3)

## 2014-01-31 LAB — CULTURE, RESPIRATORY W GRAM STAIN

## 2014-01-31 LAB — MAGNESIUM: MAGNESIUM: 2.5 mg/dL (ref 1.5–2.5)

## 2014-01-31 LAB — CBC
HEMATOCRIT: 35.8 % — AB (ref 36.0–46.0)
HEMOGLOBIN: 10.9 g/dL — AB (ref 12.0–15.0)
MCH: 27.1 pg (ref 26.0–34.0)
MCHC: 30.4 g/dL (ref 30.0–36.0)
MCV: 89.1 fL (ref 78.0–100.0)
Platelets: 153 10*3/uL (ref 150–400)
RBC: 4.02 MIL/uL (ref 3.87–5.11)
RDW: 15.5 % (ref 11.5–15.5)
WBC: 9.9 10*3/uL (ref 4.0–10.5)

## 2014-01-31 LAB — CULTURE, RESPIRATORY: SPECIAL REQUESTS: NORMAL

## 2014-01-31 LAB — PHOSPHORUS: PHOSPHORUS: 2.4 mg/dL (ref 2.3–4.6)

## 2014-01-31 LAB — TRIGLYCERIDES: Triglycerides: 151 mg/dL — ABNORMAL HIGH (ref <150)

## 2014-01-31 MED ORDER — POTASSIUM CHLORIDE 20 MEQ/15ML (10%) PO LIQD
40.0000 meq | Freq: Every day | ORAL | Status: DC
  Administered 2014-01-31: 40 meq
  Filled 2014-01-31 (×2): qty 30

## 2014-01-31 MED ORDER — FUROSEMIDE 10 MG/ML IJ SOLN
40.0000 mg | Freq: Three times a day (TID) | INTRAMUSCULAR | Status: DC
  Administered 2014-01-31 – 2014-02-02 (×6): 40 mg via INTRAVENOUS
  Filled 2014-01-31 (×10): qty 4

## 2014-01-31 MED ORDER — CLONAZEPAM 1 MG PO TABS
1.0000 mg | ORAL_TABLET | Freq: Two times a day (BID) | ORAL | Status: DC
  Administered 2014-01-31 (×2): 1 mg
  Filled 2014-01-31 (×3): qty 1

## 2014-01-31 MED ORDER — CEFAZOLIN SODIUM 1-5 GM-% IV SOLN
1.0000 g | Freq: Three times a day (TID) | INTRAVENOUS | Status: DC
  Administered 2014-01-31 – 2014-02-06 (×19): 1 g via INTRAVENOUS
  Filled 2014-01-31 (×24): qty 50

## 2014-01-31 NOTE — Progress Notes (Signed)
Name: [name] MRN: [id number] DOB: September 13, 1941    ADMISSION DATE:  01/25/2014 CONSULTATION DATE:  01/28/14  REFERRING MD :  Dr Jerilee Hoh PRIMARY SERVICE: [facility]>>>PCCM  CHIEF COMPLAINT:  Resp failure  BRIEF PATIENT DESCRIPTION:  73 yr old COPD (RA pt in office, recent admission requiring ETT)admitted SOB, now in distress , NIMV failed -ETT   SIGNIFICANT EVENTS / STUDIES:  4/9 - admission SOB 4/9 CT - Negative for pulmonary embolism.<BR>2. Remote left ventricular apex infarct.<BR>3. Emphysema.<BR>4. 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 Correlate with left upper extremity symptoms 4/10 - declined requiring NIMV 4/11- severe dyschrony, paralyzed  LINES / TUBES: 4/11 ETT>>> 4/11 [place]>>> 4/10 foley>>>  CULTURES: 4/8 BC>>> 4/12 sputum>>>abundant SA>>> ............ 4/13 - REsp Virus panel (staff MD note) >>  ANTIBIOTICS: Levofloxacin 4/9>>> vanc 4/13>>>  SUBJECTIVE: Increased resp effort w/ SBT. No sig improvement   VITAL SIGNS: Temp:  [98.1 F (36.7 C)-100.2 F (37.9 C)] 98.8 F (37.1 C) (04/14 0800) Pulse Rate:  [79-124] 87 (04/14 0800) Resp:  [12-25] 18 (04/14 0800) BP: (100-175)/(39-108) 175/85 mmHg (04/14 0800) SpO2:  [91 %-100 %] 95 % (04/14 0800) FiO2 (%):  [40 %] 40 % (04/14 0800) Weight:  [72.9 kg (160 lb 11.5 oz)] 72.9 kg (160 lb 11.5 oz) (04/14 0200) HEMODYNAMICS:   VENTILATOR SETTINGS: Vent Mode:  [-] PRVC FiO2 (%):  [40 %] 40 % Set Rate:  [12 bmp] 12 bmp Vt Set:  [500 mL] 500 mL PEEP:  [5 cmH20] 5 cmH20 Plateau Pressure:  [18 cmH20-29 cmH20] 18 cmH20 INTAKE / OUTPUT:  Intake/Output Summary (Last 24 hours) at 01/31/14 0859 Last data filed at 01/31/14 0800  Gross per 24 hour  Intake 1853.75 ml  Output   1690 ml  Net 163.75 ml    PHYSICAL EXAMINATION: General: easily arousable  HEENT: orally intubated  cardiovascular:  s1 s2 ST no r/ m Lungs:  Scattered rhonchi, developed prolonged exp wz and increased  resp effort almost immediately after placed on SBT Abdomen:  Soft, BS wnl, no r/g Musculoskeletal:  No edema Skin:  No rash  LABS:  PULMONARY  Recent Labs Lab 01/28/14 2053 01/28/14 2300 01/29/14 0228 01/29/14 0443 01/30/14 0501  PHART 7.249* 7.284* 7.344* 7.354 7.378  PCO2ART 72.2* 74.0* 62.8* 62.2* 61.5*  PO2ART 111.0* 117.0* 113.0* 115.0* 110.0*  HCO3 30.5* 34.0* 33.3* 33.8* 35.4*  TCO2 28.8 31.5 30.5 31.0 32.3  O2SAT 97.5 98.1 98.4 98.6 98.3    CBC  Recent Labs Lab 01/29/14 0515 01/30/14 0530 01/31/14 0552  HGB 11.4* 11.5* 10.9*  HCT 36.7 37.4 35.8*  WBC 9.3 8.1 9.9  PLT 172 162 153    COAGULATION No results found for this basename: INR,  in the last 168 hours  CARDIAC    Recent Labs Lab 01/28/14 0859  TROPONINI <0.30    Recent Labs Lab 01/25/14 2154  PROBNP 485.2*     CHEMISTRY  Recent Labs Lab 01/27/14 0415 01/28/14 0635 01/29/14 0515 01/30/14 0530 01/31/14 0552  NA 142 143 142 145 146  K 4.9 4.3 3.7 3.0* 4.1  CL 106 102 101 101 106  CO2 23 26 34* 36* 34*  GLUCOSE 173* 184* 149* 191* 133*  BUN 19 23 23  35* 30*  CREATININE 0.69 0.81 0.74 0.76 0.63  CALCIUM 9.3 9.3 8.7 8.8 8.6  MG  --   --   --   --  2.5  PHOS  --   --   --   --  2.4   Estimated Creatinine Clearance: 58 ml/min (by C-G formula based on Cr of 0.63).   LIVER  Recent Labs Lab 01/29/14 0515 01/30/14 0530 01/31/14 0552  AST 14 16 19   ALT 20 19 26   ALKPHOS 78 74 65  BILITOT <0.2* <0.2* 0.2*  PROT 6.3 6.0 5.3*  ALBUMIN 3.4* 3.1* 2.7*     INFECTIOUS  Recent Labs Lab 01/25/14 2231  LATICACIDVEN 1.49     ENDOCRINE CBG (last 3)   Recent Labs  01/30/14 1637 01/30/14 1925 01/30/14 2357  GLUCAP 96 140* 130*     IMAGING x48h  Dg Chest Port 1 View  01/30/2014   CLINICAL DATA:  Acute respiratory failure. On ventilator. COPD and coronary artery disease.  EXAM: PORTABLE CHEST - 1 VIEW  COMPARISON:  01/29/2014  FINDINGS: Endotracheal tube,  nasogastric tube, and right subclavian central venous catheter remain in appropriate position. No pneumothorax identified. Both lungs are clear. Heart size is within normal limits. Left shoulder prosthesis again noted.  IMPRESSION: Support lines and tubes in appropriate position.  No acute findings.   Electronically Signed   By: Earle Gell M.D.   On: 01/30/2014 07:11    CXR: clear, ETT in good position  ASSESSMENT / PLAN:  PULMONARY A: Acute Exacerbation COPD, Acute resp failure, r/o failure contribution (admitted end Jan 2015 on vent with Flu an and MSSA pna) Neg PE Developed increased WOB and accessory muscle use w/ SBT. Not ready for extubation.  P:   Cont weaning efforts.  Cont systemic steroids Cont bronchodilators  Sedation protocol  Neg fluid balance goal  See ID section   CARDIOVASCULAR A: Known systolic heart failure, CAD P:  neg balance remains as goal, was positive last 24hrs. 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 She still has a positive fluid balance suspect that this and her anxiety are her biggest barriers to extubation. Will escalate her diuresis, also go up on her klonopin. Still awaiting sensitivities  from SA growing in sputum. See further thoughts from attending... Marni Griffon ACNP-BC [place] Pager # [phone] OR # [phone] if no answer   01/31/2014 8:56 AM   STAFF NOTE: I, Dr Ann Lions have personally reviewed patient's available data, including medical history, events of note, physical examination and test results as part of my evaluation. I have discussed with resident/NP and other care providers such as pharmacist, RN and RRT.  In addition,  I personally evaluated patient and elicited key findings of  Acute resop failure - likely HCAP/AECOPD. Failure to improve. See NP notes for details.  Rest per NP/medical  resident whose note is outlined above and that I agree with  The patient is critically ill with multiple organ systems failure and requires high complexity decision making for assessment and support, frequent evaluation and titration of therapies, application of advanced monitoring technologies and extensive interpretation of multiple databases.   Critical Care Time devoted to patient care services described in this note is  35  Minutes.  Dr. Brand Males, M.D., [facility].C.P Pulmonary and Critical Care Medicine Staff Physician [place] [facility] Pager: [phone], If no answer or between  15:00h - 7:00h: call [phone]  01/31/2014 11:45 AM

## 2014-02-01 ENCOUNTER — Inpatient Hospital Stay (HOSPITAL_COMMUNITY): Payer: Medicare Other

## 2014-02-01 LAB — COMPREHENSIVE METABOLIC PANEL
ALBUMIN: 3.1 g/dL — AB (ref 3.5–5.2)
ALT: 30 U/L (ref 0–35)
AST: 17 U/L (ref 0–37)
Alkaline Phosphatase: 75 U/L (ref 39–117)
BUN: 39 mg/dL — AB (ref 6–23)
CO2: 38 mEq/L — ABNORMAL HIGH (ref 19–32)
CREATININE: 0.71 mg/dL (ref 0.50–1.10)
Calcium: 9.2 mg/dL (ref 8.4–10.5)
Chloride: 102 mEq/L (ref 96–112)
GFR calc Af Amer: >90 mL/min (ref >90)
GFR calc non Af Amer: 84 mL/min — ABNORMAL LOW (ref >90)
Glucose, Bld: 136 mg/dL — ABNORMAL HIGH (ref 70–99)
Potassium: 4 mEq/L (ref 3.7–5.3)
Sodium: 147 mEq/L (ref 137–147)
TOTAL PROTEIN: 6.4 g/dL (ref 6.0–8.3)
Total Bilirubin: 0.2 mg/dL — ABNORMAL LOW (ref 0.3–1.2)

## 2014-02-01 LAB — PHOSPHORUS: PHOSPHORUS: 3.9 mg/dL (ref 2.3–4.6)

## 2014-02-01 LAB — BLOOD GAS, ARTERIAL
ACID-BASE EXCESS: 11.1 mmol/L — AB (ref 0.0–2.0)
Bicarbonate: 37.7 mEq/L — ABNORMAL HIGH (ref 20.0–24.0)
Drawn by: 308601
FIO2: 0.5 %
O2 SAT: 99 %
PATIENT TEMPERATURE: 98.6
PEEP/CPAP: 5 cmH2O
RATE: 12 resp/min
TCO2: 33.8 mmol/L (ref 0–100)
VT: 500 mL
pCO2 arterial: 60.6 mmHg (ref 35.0–45.0)
pH, Arterial: 7.411 (ref 7.350–7.450)
pO2, Arterial: 149 mmHg — ABNORMAL HIGH (ref 80.0–100.0)

## 2014-02-01 LAB — CBC
HEMATOCRIT: 39.5 % (ref 36.0–46.0)
Hemoglobin: 12.2 g/dL (ref 12.0–15.0)
MCH: 26.9 pg (ref 26.0–34.0)
MCHC: 30.9 g/dL (ref 30.0–36.0)
MCV: 87.2 fL (ref 78.0–100.0)
PLATELETS: 174 10*3/uL (ref 150–400)
RBC: 4.53 MIL/uL (ref 3.87–5.11)
RDW: 15.6 % — AB (ref 11.5–15.5)
WBC: 12.6 10*3/uL — AB (ref 4.0–10.5)

## 2014-02-01 LAB — RESPIRATORY VIRUS PANEL
Adenovirus: NOT DETECTED
Influenza A H1: NOT DETECTED
Influenza A H3: NOT DETECTED
Influenza A: NOT DETECTED
Influenza B: NOT DETECTED
Metapneumovirus: NOT DETECTED
PARAINFLUENZA 3 A: NOT DETECTED
Parainfluenza 1: NOT DETECTED
Parainfluenza 2: NOT DETECTED
RHINOVIRUS: NOT DETECTED
Respiratory Syncytial Virus A: NOT DETECTED
Respiratory Syncytial Virus B: NOT DETECTED

## 2014-02-01 LAB — GLUCOSE, CAPILLARY
GLUCOSE-CAPILLARY: 144 mg/dL — AB (ref 70–99)
GLUCOSE-CAPILLARY: 122 mg/dL — AB (ref 70–99)
Glucose-Capillary: 108 mg/dL — ABNORMAL HIGH (ref 70–99)
Glucose-Capillary: 149 mg/dL — ABNORMAL HIGH (ref 70–99)
Glucose-Capillary: 156 mg/dL — ABNORMAL HIGH (ref 70–99)
Glucose-Capillary: 197 mg/dL — ABNORMAL HIGH (ref 70–99)

## 2014-02-01 LAB — CULTURE, BLOOD (ROUTINE X 2)
CULTURE: NO GROWTH
Culture: NO GROWTH

## 2014-02-01 LAB — MAGNESIUM: Magnesium: 2.8 mg/dL — ABNORMAL HIGH (ref 1.5–2.5)

## 2014-02-01 MED ORDER — LORAZEPAM 2 MG/ML IJ SOLN
0.5000 mg | INTRAMUSCULAR | Status: DC | PRN
  Administered 2014-02-01 – 2014-02-06 (×7): 0.5 mg via INTRAVENOUS
  Filled 2014-02-01 (×10): qty 1

## 2014-02-01 MED ORDER — POTASSIUM CHLORIDE CRYS ER 20 MEQ PO TBCR
40.0000 meq | EXTENDED_RELEASE_TABLET | Freq: Three times a day (TID) | ORAL | Status: DC
  Filled 2014-02-01 (×4): qty 2

## 2014-02-01 MED ORDER — INSULIN ASPART 100 UNIT/ML ~~LOC~~ SOLN
0.0000 [IU] | SUBCUTANEOUS | Status: DC
  Administered 2014-02-01: 3 [IU] via SUBCUTANEOUS
  Administered 2014-02-01 – 2014-02-02 (×5): 2 [IU] via SUBCUTANEOUS

## 2014-02-01 MED ORDER — LORAZEPAM 2 MG/ML IJ SOLN
0.5000 mg | Freq: Three times a day (TID) | INTRAMUSCULAR | Status: DC
  Administered 2014-02-01 – 2014-02-02 (×3): 0.5 mg via INTRAVENOUS
  Filled 2014-02-01 (×2): qty 1

## 2014-02-01 MED ORDER — PANTOPRAZOLE SODIUM 40 MG PO TBEC
40.0000 mg | DELAYED_RELEASE_TABLET | Freq: Two times a day (BID) | ORAL | Status: DC
  Administered 2014-02-02 – 2014-02-06 (×9): 40 mg via ORAL
  Filled 2014-02-01 (×12): qty 1

## 2014-02-01 MED ORDER — DOCUSATE SODIUM 100 MG PO CAPS
100.0000 mg | ORAL_CAPSULE | Freq: Every day | ORAL | Status: DC
  Administered 2014-02-02 – 2014-02-06 (×5): 100 mg via ORAL
  Filled 2014-02-01 (×6): qty 1

## 2014-02-01 MED ORDER — METHYLPREDNISOLONE SODIUM SUCC 40 MG IJ SOLR
40.0000 mg | Freq: Two times a day (BID) | INTRAMUSCULAR | Status: DC
  Administered 2014-02-01 – 2014-02-06 (×10): 40 mg via INTRAVENOUS
  Filled 2014-02-01 (×12): qty 1

## 2014-02-01 MED ORDER — LIDOCAINE 5 % EX PTCH
1.0000 | MEDICATED_PATCH | Freq: Every day | CUTANEOUS | Status: DC
  Administered 2014-02-01 – 2014-02-05 (×5): 1 via TRANSDERMAL
  Filled 2014-02-01 (×6): qty 1

## 2014-02-01 MED ORDER — POTASSIUM CHLORIDE 20 MEQ/15ML (10%) PO LIQD: 40.0000 meq | Freq: Three times a day (TID) | ORAL | Status: DC

## 2014-02-01 NOTE — Significant Event (Signed)
Spoke at length with the patient. Her daughter Caryl Pina, as well as her sisters were all present. The patient has requested that we do not use the ventilator again. She and her family have requested limited code status. We will continue aggressive non-invasive medical care with limited code status.  She is OK with short term BIPAP, pressors and central access. No CPR, defib, intubation or cardioversion. The orders in the chart have been changed to represent this.   Marni Griffon ACNP-BC [place] Pager # [phone] OR # [phone] if no answer

## 2014-02-01 NOTE — Progress Notes (Signed)
Patient suctioned, cuff deflatted and extubated to a 4lpm [place]. HR 106, O2 sat 94%, and RR 20. Patient able to verbalize. Will continue to monitor

## 2014-02-01 NOTE — Progress Notes (Signed)
Name: [name] MRN: [id number] DOB: 03/03/1941    ADMISSION DATE:  01/25/2014 CONSULTATION DATE:  01/28/14  REFERRING MD :  Dr Jerilee Hoh PRIMARY SERVICE: [facility]>>>PCCM  CHIEF COMPLAINT:  Resp failure  BRIEF PATIENT DESCRIPTION:  73 yr old COPD (RA pt in office, recent admission requiring ETT)admitted SOB, now in distress , NIMV failed -ETT   SIGNIFICANT EVENTS / STUDIES:  4/9 - admission SOB 4/9 CT - Negative for pulmonary embolism.<BR>2. Remote left ventricular apex infarct.<BR>3. Emphysema.<BR>4. 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 Correlate with left upper extremity symptoms 4/10 - declined requiring NIMV 4/11- severe dyschrony, paralyzed  LINES / TUBES: 4/11 ETT>>> 4/11 [place]>>> 4/10 foley>>>  CULTURES: 4/8 BC>>> 4/12 sputum>>>abundant MSSA ............ 4/13 - Resp Virus panel (staff MD note) >>  ANTIBIOTICS: Levofloxacin 4/9>>>4/14 vanc 4/13>>>4/14 Ancef 4/14>>> SUBJECTIVE: Looks better today   VITAL SIGNS: Temp:  [98.2 F (36.8 C)-100.6 F (38.1 C)] 98.8 F (37.1 C) (04/15 0418) Pulse Rate:  [83-120] 95 (04/15 0806) Resp:  [15-28] 19 (04/15 0806) BP: (117-169)/(45-86) 149/66 mmHg (04/15 0806) SpO2:  [91 %-95 %] 94 % (04/15 0806) FiO2 (%):  [40 %-50 %] 40 % (04/15 0823) Weight:  [74 kg (163 lb 2.3 oz)] 74 kg (163 lb 2.3 oz) (04/15 0428) HEMODYNAMICS:   VENTILATOR SETTINGS: Vent Mode:  [-] CPAP FiO2 (%):  [40 %-50 %] 40 % Set Rate:  [12 bmp] 12 bmp Vt Set:  [500 mL] 500 mL PEEP:  [5 cmH20] 5 cmH20 Pressure Support:  [10 cmH20] 10 cmH20 Plateau Pressure:  [18 cmH20-23 cmH20] 22 cmH20 INTAKE / OUTPUT:  Intake/Output Summary (Last 24 hours) at 02/01/14 0851 Last data filed at 02/01/14 0615  Gross per 24 hour  Intake   1538 ml  Output   2640 ml  Net  -1102 ml    PHYSICAL EXAMINATION: General: easily arousable, follows commands, a little less agitated today   HEENT: orally intubated, no JVD    cardiovascular:  s1 s2 ST no r/ m Lungs:faint exp wheeze, occ rhonchi  Abdomen:  Soft, BS wnl, no r/g Musculoskeletal:  No edema Skin:  No rash  LABS:  PULMONARY  Recent Labs Lab 01/28/14 2300 01/29/14 0228 01/29/14 0443 01/30/14 0501 02/01/14 0415  PHART 7.284* 7.344* 7.354 7.378 7.411  PCO2ART 74.0* 62.8* 62.2* 61.5* 60.6*  PO2ART 117.0* 113.0* 115.0* 110.0* 149.0*  HCO3 34.0* 33.3* 33.8* 35.4* 37.7*  TCO2 31.5 30.5 31.0 32.3 33.8  O2SAT 98.1 98.4 98.6 98.3 99.0    CBC  Recent Labs Lab 01/30/14 0530 01/31/14 0552 02/01/14 0425  HGB 11.5* 10.9* 12.2  HCT 37.4 35.8* 39.5  WBC 8.1 9.9 12.6*  PLT 162 153 174    COAGULATION No results found for this basename: INR,  in the last 168 hours  CARDIAC    Recent Labs Lab 01/28/14 0859  TROPONINI <0.30    Recent Labs Lab 01/25/14 2154  PROBNP 485.2*     CHEMISTRY  Recent Labs Lab 01/28/14 0635 01/29/14 0515 01/30/14 0530 01/31/14 0552 02/01/14 0425  NA 143 142 145 146 147  K 4.3 3.7 3.0* 4.1 4.0  CL 102 101 101 106 102  CO2 26 34* 36* 34* 38*  GLUCOSE 184* 149* 191* 133* 136*  BUN 23 23 35* 30* 39*  CREATININE 0.81 0.74 0.76 0.63 0.71  CALCIUM 9.3 8.7 8.8 8.6 9.2  MG  --   --   --  2.5 2.8*  PHOS  --   --   --  2.4 3.9   Estimated Creatinine Clearance: 58.5 ml/min (by C-G formula based on Cr of 0.71).   LIVER  Recent Labs Lab 01/29/14 0515 01/30/14 0530 01/31/14 0552 02/01/14 0425  AST 14 16 19 17   ALT 20 19 26 30   ALKPHOS 78 74 65 75  BILITOT <0.2* <0.2* 0.2* 0.2*  PROT 6.3 6.0 5.3* 6.4  ALBUMIN 3.4* 3.1* 2.7* 3.1*     INFECTIOUS  Recent Labs Lab 01/25/14 2231  LATICACIDVEN 1.49     ENDOCRINE CBG (last 3)   Recent Labs  01/31/14 2350 02/01/14 0345 02/01/14 0752  GLUCAP 197* 156* 144*     IMAGING x48h  Dg Chest Port 1 View  02/01/2014   CLINICAL DATA:  Hypoxia  EXAM: PORTABLE CHEST - 1 VIEW  COMPARISON:  January 30, 2014  FINDINGS: Endotracheal tube tip is  3.0 cm above the carina. Central catheter tip is in the superior vena cava near the cavoatrial junction. Nasogastric tube tip and side port are below the diaphragm. No pneumothorax. There is No edema or consolidation. The heart size is normal. Pulmonary vascularity is within normal limits. There is atherosclerotic change in aorta. 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  IMPRESSION: Tube and catheter positions as described without appreciable pneumothorax. No edema or consolidation.   Electronically Signed   By: Lowella Grip M.D.   On: 02/01/2014 07:00    CXR: clear, ETT in good position, no consolidations  ASSESSMENT / PLAN:  PULMONARY A: Acute Exacerbation COPD, Acute resp failure, r/o failure contribution (admitted end Jan 2015 on vent with Flu an and MSSA pna) Neg PE Looks better, Work of breathing improved. Tolerating SBT  P:   Will trial SBT and re-assess... If looks good extubate later this am vs PS trial today w/ hope to extubate soon  Cont systemic steroids, taper  Cont bronchodilators  PAD protocol  Neg fluid balance goal  See ID section   CARDIOVASCULAR A: Known systolic heart failure, CAD P:  Neg fluid balance goal as BP, BUN, creatinine allow  Tele plavix  RENAL A: intermittent hypokalemia  P: Replace K w/ diuresis  Keep even  F/u chemistry   GASTROINTESTINAL A: no acute  cont TF  ppi  HEMATOLOGIC A:  DVT prevention, NEG PE on CT P:  lovenox for now Cbc in am   INFECTIOUS A:  NO evidence PNA      MSSA Purulent bronchitis  P:   Changed IV to ancef 4/14, day 2/8   ENDOCRINE A:  Steroids, now paralysis, 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 Grew out MSSA that was resistant to levaquin in sputum. Changed her to ancef on 4/14, she is now on day 2/8 planned course. I am hopeful we will be able to extubate her soon. Currently on SBT and looks good. If her work for breathing remains acceptable will extubate later this am, if not will cont goal of negative fluid balance and weaning efforts. We are going to need to have some goals of care discussions after extubated. Given her high risk for re-intubation.   Laurey Arrow  [facility] ACNP-BC [place] Pager # [phone] OR # [phone] if no answer   02/01/2014 8:51 AM   STAFF NOTE: I, Dr Ann Lions have personally reviewed patient's available data, including medical history, events of note, physical examination and test results as part of my evaluation. I have discussed with resident/NP and other care providers such as pharmacist, RN and RRT.  In addition,  I personally evaluated patient and elicited key findings of acute resp failure related to AECOPD and extubated and MSSA.  Rest per NP/medical resident whose note is outlined above and that I agree with  The patient is critically ill with multiple organ systems failure and requires high complexity decision making for assessment and support, frequent evaluation and titration of therapies, application of advanced monitoring technologies and extensive interpretation of multiple databases.   Critical Care Time devoted to patient care services described in this note is  35  Minutes.  Dr. Brand Males, M.D., [facility].C.P Pulmonary and Critical Care Medicine Staff Physician [place] [facility] Pager: [phone], If no answer or between  15:00h - 7:00h: call [phone]  02/01/2014 11:25 AM

## 2014-02-02 LAB — BASIC METABOLIC PANEL
BUN: 45 mg/dL — ABNORMAL HIGH (ref 6–23)
CALCIUM: 9.9 mg/dL (ref 8.4–10.5)
CO2: 41 mEq/L (ref 19–32)
CREATININE: 0.68 mg/dL (ref 0.50–1.10)
Chloride: 97 mEq/L (ref 96–112)
GFR calc non Af Amer: 85 mL/min — ABNORMAL LOW (ref >90)
Glucose, Bld: 117 mg/dL — ABNORMAL HIGH (ref 70–99)
Potassium: 3.1 mEq/L — ABNORMAL LOW (ref 3.7–5.3)
Sodium: 151 mEq/L — ABNORMAL HIGH (ref 137–147)

## 2014-02-02 LAB — MAGNESIUM: Magnesium: 2.8 mg/dL — ABNORMAL HIGH (ref 1.5–2.5)

## 2014-02-02 LAB — GLUCOSE, CAPILLARY
GLUCOSE-CAPILLARY: 111 mg/dL — AB (ref 70–99)
GLUCOSE-CAPILLARY: 142 mg/dL — AB (ref 70–99)
GLUCOSE-CAPILLARY: 141 mg/dL — AB (ref 70–99)
GLUCOSE-CAPILLARY: 116 mg/dL — AB (ref 70–99)
Glucose-Capillary: 144 mg/dL — ABNORMAL HIGH (ref 70–99)
Glucose-Capillary: 178 mg/dL — ABNORMAL HIGH (ref 70–99)

## 2014-02-02 LAB — PHOSPHORUS: PHOSPHORUS: 4.6 mg/dL (ref 2.3–4.6)

## 2014-02-02 MED ORDER — ARFORMOTEROL TARTRATE 15 MCG/2ML IN NEBU
15.0000 ug | INHALATION_SOLUTION | Freq: Two times a day (BID) | RESPIRATORY_TRACT | Status: DC
  Administered 2014-02-02 – 2014-02-06 (×9): 15 ug via RESPIRATORY_TRACT
  Filled 2014-02-02 (×14): qty 2

## 2014-02-02 MED ORDER — FUROSEMIDE 40 MG PO TABS
40.0000 mg | ORAL_TABLET | Freq: Two times a day (BID) | ORAL | Status: DC
  Administered 2014-02-02 – 2014-02-06 (×8): 40 mg via ORAL
  Filled 2014-02-02 (×12): qty 1

## 2014-02-02 MED ORDER — POTASSIUM CHLORIDE CRYS ER 20 MEQ PO TBCR
40.0000 meq | EXTENDED_RELEASE_TABLET | Freq: Once | ORAL | Status: AC
  Administered 2014-02-02: 40 meq via ORAL
  Filled 2014-02-02: qty 2

## 2014-02-02 MED ORDER — BUDESONIDE 0.5 MG/2ML IN SUSP
0.5000 mg | Freq: Two times a day (BID) | RESPIRATORY_TRACT | Status: DC
  Administered 2014-02-02 – 2014-02-06 (×9): 0.5 mg via RESPIRATORY_TRACT
  Filled 2014-02-02 (×12): qty 2

## 2014-02-02 MED ORDER — BUSPIRONE HCL 15 MG PO TABS
15.0000 mg | ORAL_TABLET | Freq: Two times a day (BID) | ORAL | Status: DC
  Administered 2014-02-02 – 2014-02-06 (×9): 15 mg via ORAL
  Filled 2014-02-02 (×10): qty 1

## 2014-02-02 MED ORDER — ALBUTEROL SULFATE (2.5 MG/3ML) 0.083% IN NEBU
2.5000 mg | INHALATION_SOLUTION | RESPIRATORY_TRACT | Status: DC | PRN
  Administered 2014-02-03 – 2014-02-06 (×4): 2.5 mg via RESPIRATORY_TRACT
  Filled 2014-02-02 (×4): qty 3

## 2014-02-02 MED ORDER — BUDESONIDE 0.5 MG/2ML IN SUSP
RESPIRATORY_TRACT | Status: AC
  Filled 2014-02-02: qty 2

## 2014-02-02 MED ORDER — TIOTROPIUM BROMIDE MONOHYDRATE 18 MCG IN CAPS
18.0000 ug | ORAL_CAPSULE | Freq: Every day | RESPIRATORY_TRACT | Status: DC
  Administered 2014-02-02 – 2014-02-06 (×5): 18 ug via RESPIRATORY_TRACT
  Filled 2014-02-02 (×2): qty 5

## 2014-02-02 MED ORDER — INSULIN ASPART 100 UNIT/ML ~~LOC~~ SOLN
0.0000 [IU] | Freq: Three times a day (TID) | SUBCUTANEOUS | Status: DC
  Administered 2014-02-02 – 2014-02-03 (×2): 3 [IU] via SUBCUTANEOUS
  Administered 2014-02-03: 7 [IU] via SUBCUTANEOUS
  Administered 2014-02-04: 4 [IU] via SUBCUTANEOUS
  Administered 2014-02-05: 7 [IU] via SUBCUTANEOUS
  Administered 2014-02-06: 3 [IU] via SUBCUTANEOUS
  Administered 2014-02-06: 7 [IU] via SUBCUTANEOUS

## 2014-02-02 MED ORDER — CLONAZEPAM 1 MG PO TABS
1.0000 mg | ORAL_TABLET | Freq: Two times a day (BID) | ORAL | Status: DC
  Administered 2014-02-02 – 2014-02-06 (×9): 1 mg via ORAL
  Filled 2014-02-02 (×8): qty 1

## 2014-02-02 MED ORDER — ACETAMINOPHEN 325 MG PO TABS: 650.0000 mg | ORAL_TABLET | Freq: Four times a day (QID) | ORAL | Status: DC | PRN

## 2014-02-02 MED ORDER — ENSURE COMPLETE PO LIQD
237.0000 mL | Freq: Two times a day (BID) | ORAL | Status: DC
  Administered 2014-02-02 – 2014-02-06 (×9): 237 mL via ORAL

## 2014-02-02 NOTE — Progress Notes (Signed)
CSW received notification from [facility] that pt family wishing to speak with this CSW regarding potential disposition to [facility].  CSW met with pt daughter, Arby Barrette, her husband, and pt two sisters alone in consult room. CSW allowed pt daughter, Arby Barrette time to discuss her feelings and frustrations. Pt daughter expressed that she feels that since pt discussed that she no longer wanted to be intubated or received compressions that her mother is not seen as "giving up". Pt daughter expressed that pt still wants aggressive measures including bipap, but has expressed that she does not want to be reintubated or receive chest compressions.   Pt family had received the impression that pt was not appropriate for [facility] at this time and pt family expressed confusion regarding this as pt had been to [facility] in the past and pt family familiar with the qualifications for LTAC therefore were not sure why pt may not be appropriate at this time. CSW discussed that CSW could clarify with [place] family agreeable.  CSW clarified pt family questions and concerns regarding SNF placement and insurance coverage.   Pt family planned to meet with PCCM NP to further discuss pt care plan and disposition options.  CSW contacted [facility] liaison, Sonia Baller who discussed that pt was never stated to not be appropriate for [facility] at this time, but it was difficult from liaison's initial chart review to determine if it was reasonable to [facility] wanted to ensure that pt goals were clarified and that it would not be a situation where pt went to [facility] and then determined after arriving that she wanted to transition to comfort care. CSW discussed CSW conversation with pt family and CSW conversations with PCCM NP and discussed that pt seeking aggressive measures at this time, but after last extubation expressed wishes  to not be re-intubated or receive CPR. Sonia Baller with [facility] stated that full assessment would be completed to further explore [facility] as an option for pt. CSW notified PCCM of information and he relayed this information to the family.  CSW to continue to follow to provide continued support and assistance to pt and pt family.  Alison Murray, MSW, [place] Work [phone]

## 2014-02-02 NOTE — Progress Notes (Signed)
OT Cancellation Note  Patient Details Name: TAMINA CYPHERS MRN: [id number] DOB: [birth year]   Cancelled Treatment:    Reason Eval/Treat Not Completed: Other (comment).  RN/NT with pt and getting her back to bed; pt is fatiqued.  Will check back tomorrow.  Lesle Chris 02/02/2014, 3:12 PM Lesle Chris, OTR/L [phone] 02/02/2014

## 2014-02-02 NOTE — Progress Notes (Signed)
CARE MANAGEMENT NOTE 02/02/2014  Patient:  [name], [name]   Account Number:  [id number]  Date Initiated:  01/26/2014  Documentation initiated by:  DAVIS,RHONDA  Subjective/Objective Assessment:   pt with hx of copd  now with excerbation.  abnormal abg's     Action/Plan:   recent discharge-[id number] from [facility] for resp rehab.   Anticipated DC Date:  02/05/2014   Anticipated DC Plan:  SKILLED NURSING FACILITY  In-house referral  Clinical Social Worker      DC Planning Services  NA      [facility] Choice  NA   Choice offered to / List presented to:  NA   DME arranged  NA      DME agency  NA     [place] arranged  NA      [place] agency  NA   Status of service:  In process, will continue to follow Medicare Important Message given?  NA - LOS <3 / Initial given by admissions (If response is "NO", the following Medicare IM given date fields will be blank) Date Medicare IM given:   Date Additional Medicare IM given:    Discharge Disposition:    Per UR Regulation:  Reviewed for med. necessity/level of care/duration of stay  If discussed at [place] of Stay Meetings, dates discussed:    Comments:  [id number]/Rhonda Eldridge Dace, [place], [place] [phone] Chart Reviewed for discharge and hospital needs. Discharge needs at time of review: None present will follow for needs. will suggest palliative care consult to pccm. Review of patient progress due on [id number].   [id number]/JQBHAL Rosana Hoes, RN, BSN, CCM, [phone] Chart reviewed for update of needs and condition./ [id number]/pklaced on bipap, and required intubation later in day on [id number]/onvent day 2/ icu status.  [id number]/QASTMH Rosana Hoes, RN, BSN, CCM [phone] Chart Reviewed for discharge and hospital needs. Discharge needs at time of review: None present will follow for needs. Review of patient progress due on [id number].

## 2014-02-02 NOTE — Progress Notes (Addendum)
NUTRITION FOLLOW UP  Intervention:   - Nursing to encourage increased meal intake when pt awake/alert - Ensure Complete BID - Will continue to monitor    Nutrition Dx:   Inadequate oral intake related to inability to eat as evidenced by NPO status - ongoing but now related to lethargy as evidenced by RN report.    Goal:   Patient will meet >/=90% of estimated nutrition needs - not met  Monitor:   Weights, labs, intake  Assessment:   73 year old female with history of COPD, recent admit requiring intubation. Admitted with worsening SOB and cough.   Patient with history of difficult sedation.   4/12 - She is currently intubated and sedated on propofol at 18.1 ml/hr, providing 478 kcal from lipids. Per RN, will likely continue to run at this rate. TF per Adult Enteral Nutrition Protocol, Vital High Protein just started this AM, currently running at 20 ml/hr. No weight loss per chart review.   4/13 - Per RN, pt tolerating TF well without any residuals. Remains intubated. Current TF: Vital High Protein at 67m/hr with Prostat 3101mBID which provides 920 calories, 93g protein (meets 52% estimated calorie needs, 100% estimated protein needs). Current TF plus calories from current Propofol rate provide 1099 calories, 93g protein - meeting 62% estimated calorie needs   4/16 - Extubated yesterday. Diet advanced to regular this morning. Attempted to meet with pt, however RN said pt was using the bedpan and that she has been lethargic today.   Potassium low, getting multivitamin    Height:  Ht Readings from Last 1 Encounters:   01/26/14  5' 4"  (1.626 m)      Weight Status:   Wt Readings from Last 1 Encounters:  02/02/14 149 lb 7.6 oz (67.8 kg)  Admit wt:        167 lb 1.7 oz (75.8 kg)    Re-estimated needs:  Kcal: 1400-1600 Protein: 70-80g Fluid: 1.4-1.6L/day  Skin: +1 generalized edema, +1 RUE, LUE edema, non-pitting RLE, LLE edema  Diet Order: General   Intake/Output Summary  (Last 24 hours) at 02/02/14 1159 Last data filed at 02/02/14 1000  Gross per 24 hour  Intake    590 ml  Output   2750 ml  Net  -2160 ml    Last BM: 4/10   Labs:   Recent Labs Lab 01/31/14 0552 02/01/14 0425 02/02/14 0530  NA 146 147 151*  K 4.1 4.0 3.1*  CL 106 102 97  CO2 34* 38* 41*  BUN 30* 39* 45*  CREATININE 0.63 0.71 0.68  CALCIUM 8.6 9.2 9.9  MG 2.5 2.8* 2.8*  PHOS 2.4 3.9 4.6  GLUCOSE 133* 136* 117*    CBG (last 3)   Recent Labs  02/01/14 2338 02/02/14 0346 02/02/14 0810  GLUCAP 144* 111* 142*    Scheduled Meds: . anastrozole  1 mg Oral QHS  . antiseptic oral rinse  15 mL Mouth Rinse q12n4p  . arformoterol  15 mcg Nebulization BID  . aspirin  81 mg Oral QHS  . budesonide (PULMICORT) nebulizer solution  0.5 mg Nebulization BID  . busPIRone  15 mg Oral BID  .  ceFAZolin (ANCEF) IV  1 g Intravenous 3 times per day  . chlorhexidine  15 mL Mouth Rinse BID  . clonazePAM  1 mg Oral Q12H  . clopidogrel  75 mg Oral Daily  . docusate sodium  100 mg Oral Daily  . enoxaparin (LOVENOX) injection  40 mg Subcutaneous Q24H  .  furosemide  40 mg Oral BID  . gabapentin  300 mg Oral QHS  . insulin aspart  0-20 Units Subcutaneous TID WC  . lidocaine  1 patch Transdermal QHS  . methylPREDNISolone (SOLU-MEDROL) injection  40 mg Intravenous Q12H  . metoprolol tartrate  12.5 mg Oral QHS  . multivitamin with minerals  1 tablet Oral Daily  . nystatin  5 mL Oral BID  . oxybutynin  5 mg Oral QHS  . pantoprazole  40 mg Oral BID  . polyethylene glycol  17 g Oral Daily  . simvastatin  40 mg Oral q1800  . tiotropium  18 mcg Inhalation Daily        Mikey College MS, RD, [place] [phone] Pager [phone] After Hours Pager

## 2014-02-02 NOTE — Progress Notes (Signed)
Name: [name] MRN: [id number] DOB: Mar 16, 1941    ADMISSION DATE:  01/25/2014 CONSULTATION DATE:  01/28/14  REFERRING MD :  Dr Jerilee Hoh PRIMARY SERVICE: [facility]>>>PCCM  CHIEF COMPLAINT:  Resp failure  BRIEF PATIENT DESCRIPTION:  73 yr old COPD (RA pt in office, recent admission requiring ETT)admitted SOB, now in distress , NIMV failed -ETT  SIGNIFICANT EVENTS: 4/9 - admission SOB 4/10 - declined requiring NIMV 4/11- severe dyschrony, paralyzed 4/15 - family discussion >> no CPR, no intubation, no defibrillation/cardioversion  STUDIES: 4/9 CT chest - no PE, emphysema changes, stenosis of proximal Lt subclavian artery  LINES / TUBES: 4/11 ETT>>>4/15 4/11 [place]>>>  CULTURES: 4/8  BC>>>negative 4/12 sputum>>>abundant MSSA 4/13 Resp Virus panel >>negative  ANTIBIOTICS: Levofloxacin 4/9>>>4/14 vanc 4/13>>>4/14 Ancef 4/14>>>  SUBJECTIVE: Very sleepy.  VITAL SIGNS: Temp:  [97.5 F (36.4 C)-98.6 F (37 C)] 97.5 F (36.4 C) (04/16 0400) Pulse Rate:  [79-111] 79 (04/16 0800) Resp:  [12-27] 14 (04/16 0800) BP: (95-159)/(34-90) 122/65 mmHg (04/16 0800) SpO2:  [85 %-96 %] 93 % (04/16 0800) FiO2 (%):  [40 %] 40 % (04/15 0823) Weight:  [149 lb 7.6 oz (67.8 kg)] 149 lb 7.6 oz (67.8 kg) (04/16 0200) INTAKE / OUTPUT:  Intake/Output Summary (Last 24 hours) at 02/02/14 0808 Last data filed at 02/02/14 0800  Gross per 24 hour  Intake    639 ml  Output   2875 ml  Net  -2236 ml    PHYSICAL EXAMINATION: General: no distress HEENT: no sinus tenderness Cardiovascular: regular Lungs: decreased breath sounds, faint wheeze Abdomen:  Soft, non tender Musculoskeletal:  No edema Skin:  No rash Neuro: sleepy, opens eyes with stimulation, follows simple commands  LABS:  PULMONARY  Recent Labs Lab 01/28/14 2300 01/29/14 0228 01/29/14 0443 01/30/14 0501 02/01/14 0415  PHART 7.284* 7.344* 7.354 7.378 7.411  PCO2ART 74.0* 62.8* 62.2* 61.5* 60.6*  PO2ART 117.0*  113.0* 115.0* 110.0* 149.0*  HCO3 34.0* 33.3* 33.8* 35.4* 37.7*  TCO2 31.5 30.5 31.0 32.3 33.8  O2SAT 98.1 98.4 98.6 98.3 99.0    CBC  Recent Labs Lab 01/30/14 0530 01/31/14 0552 02/01/14 0425  HGB 11.5* 10.9* 12.2  HCT 37.4 35.8* 39.5  WBC 8.1 9.9 12.6*  PLT 162 153 174   CARDIAC    Recent Labs Lab 01/28/14 0859  TROPONINI <0.30   No results found for this basename: PROBNP,  in the last 168 hours   CHEMISTRY  Recent Labs Lab 01/29/14 0515 01/30/14 0530 01/31/14 0552 02/01/14 0425 02/02/14 0530  NA 142 145 146 147 151*  K 3.7 3.0* 4.1 4.0 3.1*  CL 101 101 106 102 97  CO2 34* 36* 34* 38* 41*  GLUCOSE 149* 191* 133* 136* 117*  BUN 23 35* 30* 39* 45*  CREATININE 0.74 0.76 0.63 0.71 0.68  CALCIUM 8.7 8.8 8.6 9.2 9.9  MG  --   --  2.5 2.8* 2.8*  PHOS  --   --  2.4 3.9 4.6   Estimated Creatinine Clearance: 56 ml/min (by C-G formula based on Cr of 0.68).   LIVER  Recent Labs Lab 01/29/14 0515 01/30/14 0530 01/31/14 0552 02/01/14 0425  AST 14 16 19 17   ALT 20 19 26 30   ALKPHOS 78 74 65 75  BILITOT <0.2* <0.2* 0.2* 0.2*  PROT 6.3 6.0 5.3* 6.4  ALBUMIN 3.4* 3.1* 2.7* 3.1*    ENDOCRINE CBG (last 3)   Recent Labs  02/01/14 2008 02/01/14 2338 02/02/14 0346  GLUCAP 108* 144*  111*     IMAGING x48h  Dg Chest Port 1 View  02/01/2014   CLINICAL DATA:  Hypoxia  EXAM: PORTABLE CHEST - 1 VIEW  COMPARISON:  January 30, 2014  FINDINGS: Endotracheal tube tip is 3.0 cm above the carina. Central catheter tip is in the superior vena cava near the cavoatrial junction. Nasogastric tube tip and side port are below the diaphragm. No pneumothorax. There is No edema or consolidation. The heart size is normal. Pulmonary vascularity is within normal limits. There is atherosclerotic change in aorta. 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  IMPRESSION: Tube and catheter positions as described without appreciable pneumothorax. No edema or consolidation.    Electronically Signed   By: Lowella Grip M.D.   On: 02/01/2014 07:00   ASSESSMENT / PLAN:  PULMONARY A:  Acute Exacerbation COPD, Acute resp failure, r/o failure contribution (admitted end Jan 2015 on vent with Flu an and MSSA pna).  P:   Oxygen to keep SpO2 > 88% F/u CXR intermittently DNI Continue solumedrol 40 mg q12h for now Change nebs to brovana, pulmicort, and spiriva with prn albuterol  CARDIOVASCULAR A: Hx of CAD, systolic CHF. P:  Continue ASA, plavix, lopressor Change lasix to 40 mg po bid on 4/16  RENAL A:  Hypernatremia. Hypokalemia. P: F/u and replace electrolytes as needed Keep foley for now  GASTROINTESTINAL A:  Nutrition. P: Advance diet as tolerated Protonix for SUP  HEMATOLOGIC A:   No acute issues. P:  F/u CBC intermittently Lovenox for DVT prevention  INFECTIOUS A:   MSSA tracheobronchitis with AECOPD. P:   Day 3/8 Ancef  ENDOCRINE A:   Steroid induced hyperglycemia. P:   SSI  NEUROLOGIC A:   Benzo dep, PTSD from last ETT. Deconditioning. P:   Continue buspar, klonopin, neurontin Change ativan to prn only PT/OT, mobilize  Chesley Mires, MD [place] 02/02/2014, 8:18 AM Pager:  [phone] After 3pm call: [phone]

## 2014-02-03 LAB — BASIC METABOLIC PANEL
BUN: 43 mg/dL — ABNORMAL HIGH (ref 6–23)
CHLORIDE: 95 meq/L — AB (ref 96–112)
CO2: 38 mEq/L — ABNORMAL HIGH (ref 19–32)
Calcium: 9.5 mg/dL (ref 8.4–10.5)
Creatinine, Ser: 0.7 mg/dL (ref 0.50–1.10)
GFR calc non Af Amer: 85 mL/min — ABNORMAL LOW (ref >90)
GLUCOSE: 112 mg/dL — AB (ref 70–99)
POTASSIUM: 3.6 meq/L — AB (ref 3.7–5.3)
SODIUM: 143 meq/L (ref 137–147)

## 2014-02-03 LAB — GLUCOSE, CAPILLARY
GLUCOSE-CAPILLARY: 117 mg/dL — AB (ref 70–99)
Glucose-Capillary: 240 mg/dL — ABNORMAL HIGH (ref 70–99)
Glucose-Capillary: 125 mg/dL — ABNORMAL HIGH (ref 70–99)

## 2014-02-03 LAB — CBC
HCT: 44.7 % (ref 36.0–46.0)
HEMOGLOBIN: 13.9 g/dL (ref 12.0–15.0)
MCH: 27.1 pg (ref 26.0–34.0)
MCHC: 31.1 g/dL (ref 30.0–36.0)
MCV: 87.3 fL (ref 78.0–100.0)
Platelets: 192 10*3/uL (ref 150–400)
RBC: 5.12 MIL/uL — ABNORMAL HIGH (ref 3.87–5.11)
RDW: 15 % (ref 11.5–15.5)
WBC: 15.2 10*3/uL — ABNORMAL HIGH (ref 4.0–10.5)

## 2014-02-03 MED ORDER — ALBUTEROL SULFATE (2.5 MG/3ML) 0.083% IN NEBU
2.5000 mg | INHALATION_SOLUTION | RESPIRATORY_TRACT | Status: DC | PRN
Start: 2014-02-03 — End: 2014-03-20

## 2014-02-03 MED ORDER — BIOTENE DRY MOUTH MT LIQD: 15.0000 mL | Freq: Two times a day (BID) | OROMUCOSAL | Status: DC

## 2014-02-03 MED ORDER — LORAZEPAM 2 MG/ML IJ SOLN: 0.5000 mg | INTRAMUSCULAR | Status: DC | PRN

## 2014-02-03 MED ORDER — CLONAZEPAM 1 MG PO TABS: 1.0000 mg | ORAL_TABLET | Freq: Two times a day (BID) | ORAL | Status: DC

## 2014-02-03 MED ORDER — ARFORMOTEROL TARTRATE 15 MCG/2ML IN NEBU: 15.0000 ug | INHALATION_SOLUTION | Freq: Two times a day (BID) | RESPIRATORY_TRACT | Status: DC

## 2014-02-03 MED ORDER — INSULIN ASPART 100 UNIT/ML ~~LOC~~ SOLN: 0.0000 [IU] | Freq: Three times a day (TID) | SUBCUTANEOUS | Status: DC

## 2014-02-03 MED ORDER — POTASSIUM CHLORIDE CRYS ER 20 MEQ PO TBCR
40.0000 meq | EXTENDED_RELEASE_TABLET | Freq: Every day | ORAL | Status: DC
  Administered 2014-02-04 – 2014-02-06 (×3): 40 meq via ORAL
  Filled 2014-02-03 (×3): qty 2

## 2014-02-03 MED ORDER — DSS 100 MG PO CAPS: 100.0000 mg | ORAL_CAPSULE | Freq: Every day | ORAL | Status: AC

## 2014-02-03 MED ORDER — FUROSEMIDE 40 MG PO TABS: 40.0000 mg | ORAL_TABLET | Freq: Two times a day (BID) | ORAL | Status: DC

## 2014-02-03 MED ORDER — ENOXAPARIN SODIUM 40 MG/0.4ML ~~LOC~~ SOLN: 40.0000 mg | SUBCUTANEOUS | Status: DC

## 2014-02-03 MED ORDER — ENSURE COMPLETE PO LIQD: 237.0000 mL | Freq: Two times a day (BID) | ORAL | Status: AC

## 2014-02-03 MED ORDER — METHYLPREDNISOLONE SODIUM SUCC 40 MG IJ SOLR: 40.0000 mg | Freq: Two times a day (BID) | INTRAMUSCULAR | Status: DC

## 2014-02-03 MED ORDER — CEFAZOLIN SODIUM 1-5 GM-% IV SOLN: 1.0000 g | Freq: Three times a day (TID) | INTRAVENOUS | Status: DC

## 2014-02-03 MED ORDER — LIDOCAINE 5 % EX PTCH: 1.0000 | MEDICATED_PATCH | Freq: Every day | CUTANEOUS | Status: DC

## 2014-02-03 MED ORDER — ACETAMINOPHEN 325 MG PO TABS: 650.0000 mg | ORAL_TABLET | Freq: Four times a day (QID) | ORAL | Status: DC | PRN

## 2014-02-03 MED ORDER — POLYETHYLENE GLYCOL 3350 17 G PO PACK: 17.0000 g | PACK | Freq: Every day | ORAL | Status: DC

## 2014-02-03 MED ORDER — BUDESONIDE 0.5 MG/2ML IN SUSP: 0.5000 mg | Freq: Two times a day (BID) | RESPIRATORY_TRACT | Status: DC

## 2014-02-03 MED ORDER — ALUM & MAG HYDROXIDE-SIMETH 200-200-20 MG/5ML PO SUSP
30.0000 mL | Freq: Four times a day (QID) | ORAL | Status: DC | PRN
  Filled 2014-02-03: qty 30

## 2014-02-03 MED ORDER — POTASSIUM CHLORIDE 10 MEQ/50ML IV SOLN
10.0000 meq | INTRAVENOUS | Status: AC
  Administered 2014-02-03: 10 meq via INTRAVENOUS
  Filled 2014-02-03: qty 50

## 2014-02-03 NOTE — Evaluation (Signed)
Physical Therapy Re-Evaluation Patient Details Name: [name] MRN: [id number] DOB: [birth year] Today's Date: 02/03/2014   History of Present Illness  Pt is a 73 yo female admitted with SOB and cough after being recently d/c'd from [facility] 5 days ago w respiratory issues.  Pt with h/o COPD and is on chronic O2.    Clinical Impression  Pt presents with min endurance and requiring assist of 2 for safe performance of mobility tasks.  Pt would benefit from follow up rehab at SNF level to maximize IND and safety for return home with limited assist.    Follow Up Recommendations SNF;Home health PT;Supervision/Assistance - 24 hour (dependent on acute stay progress and level of assist at home)    Equipment Recommendations  None recommended by PT    Recommendations for Other Services       Precautions / Restrictions Precautions Precautions: Fall Precaution Comments: monitor sats Restrictions Weight Bearing Restrictions: No      Mobility  Bed Mobility Overal bed mobility: Needs Assistance;+2 for physical assistance Bed Mobility: Supine to Sit     Supine to sit: HOB elevated;Mod assist     General bed mobility comments: Cues for sequencing and use of pad to bring pt around to EOB  Transfers Overall transfer level: Needs assistance Equipment used: Rolling walker (2 wheeled) Transfers: Sit to/from Stand Sit to Stand: +2 physical assistance;Mod assist Stand pivot transfers: +2 physical assistance;Mod assist       General transfer comment: Short shuffling steps and flexed posture with SOB and notable fatigue with min exertion  Ambulation/Gait Ambulation/Gait assistance: +2 physical assistance;Mod assist Ambulation Distance (Feet): 1 Feet Assistive device: Rolling walker (2 wheeled) Gait Pattern/deviations: Step-to pattern;Decreased step length - right;Decreased step length - left;Shuffle;Trunk flexed Gait velocity: slow,    General Gait Details: cues for posture and  position from [employer]            Wheelchair Mobility    Modified Rankin (Stroke Patients Only)       Balance Overall balance assessment: Needs assistance Sitting-balance support: Single extremity supported;Feet supported Sitting balance-Leahy Scale: Fair Sitting balance - Comments: ltd by fatigue with pt slumping to L when tired Postural control: Left lateral lean Standing balance support: Bilateral upper extremity supported Standing balance-Leahy Scale: Poor                               Pertinent Vitals/Pain     Home Living Family/patient expects to be discharged to:: Private residence Living Arrangements: Alone Available Help at Discharge: Friend(s);Available PRN/intermittently Type of Home: House Home Access: Stairs to enter Entrance Stairs-Rails: Can reach both;Left;Right Entrance Stairs-Number of Steps: 5 Home Layout: One level Home Equipment: Walker - 2 wheels;Cane - single point;Bedside commode;Shower seat;Other (comment);Walker - 4 wheels      Prior Function Level of Independence: Independent with assistive device(s)               Hand Dominance   Dominant Hand: Right    Extremity/Trunk Assessment   Upper Extremity Assessment: Generalized weakness RUE Deficits / Details: shoulder ROM limited to 120 degrees flexion.  Is supposed to get a shoulder replacement but then respiratory issues ensued.         Lower Extremity Assessment: Generalized weakness      Cervical / Trunk Assessment: Normal  Communication   Communication: No difficulties  Cognition Arousal/Alertness: Awake/alert Behavior During Therapy: Anxious Overall Cognitive Status: Within Functional  Limits for tasks assessed                      General Comments      Exercises        Assessment/Plan    PT Assessment Patient needs continued PT services  PT Diagnosis Difficulty walking;Generalized weakness   PT Problem List Decreased  strength;Decreased range of motion;Decreased activity tolerance;Decreased balance;Decreased mobility;Decreased knowledge of use of DME;Pain  PT Treatment Interventions DME instruction;Gait training;Stair training;Functional mobility training;Therapeutic activities;Therapeutic exercise;Patient/family education   PT Goals (Current goals can be found in the Care Plan section) Acute Rehab PT Goals Patient Stated Goal: to be able to breathe. PT Goal Formulation: With patient Time For Goal Achievement: 02/10/14 Potential to Achieve Goals: Good    Frequency Min 3X/week   Barriers to discharge Decreased caregiver support      Co-evaluation               End of Session Equipment Utilized During Treatment: Gait belt;Oxygen Activity Tolerance: Patient limited by fatigue Patient left: in chair;with call bell/phone within reach Nurse Communication: Mobility status         Time: 1010-1038 PT Time Calculation (min): 28 min   Charges:   PT Evaluation $PT Re-evaluation: 1 Procedure PT Treatments $Therapeutic Activity: 8-22 mins   PT G Codes:          Mathis Fare 02/03/2014, 12:54 PM

## 2014-02-03 NOTE — Progress Notes (Signed)
OT Cancellation Note  Patient Details Name: ELEASE SWARM MRN: [id number] DOB: 10/17/1941   Cancelled Treatment:    Reason Eval/Treat Not Completed: Other (comment).  Pt is getting ready to move to 3rd floor.  Will check back later if schedule permits.  Lesle Chris 02/03/2014, 11:43 AM Lesle Chris, OTR/L [phone] 02/03/2014

## 2014-02-03 NOTE — Progress Notes (Signed)
Have assisted pt with eating and drinking three times tonight.  Each time has resulted in extreme  Coughing episode.  Have kept HOB elevated and taken other precautions. Question if pt needs swallow evaluation.

## 2014-02-03 NOTE — Progress Notes (Addendum)
CSW continuing to follow.   CSW reviewed chart and noted discharge plan is for [facility] once bed becomes available.  CSW met with pt and multiple family members at bedside to follow up with information regarding pt remaining Medicare days at [facility] as SNF stay is likely needed following [place] is agreeable to SNF search in order for the family to be aware of options and tour facilities while pt is at [facility]. Pt has been to [facility] in the past, but is interested in exploring other options for potential placement following stay at [facility].  CSW initiated SNF search and CSW will follow up with pt family re: SNF bed offers, but pt family is aware that social worker from [place] will have to re-initiate search when pt is nearing transition from [facility] to SNF.  CSW to continue to follow.  Alison Murray, MSW, [place] [employer] [phone]

## 2014-02-03 NOTE — Progress Notes (Signed)
Name: [name] MRN: [id number] DOB: July 18, 1941    ADMISSION DATE:  01/25/2014 CONSULTATION DATE:  01/28/14  REFERRING MD :  Dr Jerilee Hoh PRIMARY SERVICE: [facility]>>>PCCM  CHIEF COMPLAINT:  Resp failure  BRIEF PATIENT DESCRIPTION:  73 yr old COPD (RA pt in office, recent admission requiring ETT)admitted SOB, now in distress , NIMV failed -ETT  SIGNIFICANT EVENTS: 4/9 - admission SOB 4/10 - declined requiring NIMV 4/11- severe dyschrony, paralyzed 4/15 - family discussion >> no CPR, no intubation, no defibrillation/cardioversion 4/17 - to floor bed  STUDIES: 4/9 CT chest - no PE, emphysema changes, stenosis of proximal Lt subclavian artery  LINES / TUBES: 4/11 ETT>>>4/15 4/11 [place]>>>  CULTURES: 4/8  BC>>>negative 4/12 sputum>>>abundant MSSA 4/13 Resp Virus panel >>negative  ANTIBIOTICS: Levofloxacin 4/9>>>4/14 vanc 4/13>>>4/14 Ancef 4/14>>>  SUBJECTIVE: More alert.  Feels hungry.  Has some wheeze.  Denies chest pain.  Feels weak.  VITAL SIGNS: Temp:  [96.9 F (36.1 C)-99 F (37.2 C)] 98.4 F (36.9 C) (04/17 0400) Pulse Rate:  [65-105] 84 (04/17 0700) Resp:  [10-21] 21 (04/17 0700) BP: (92-165)/(41-88) 110/63 mmHg (04/17 0700) SpO2:  [89 %-99 %] 96 % (04/17 0700) Weight:  [146 lb 9.7 oz (66.5 kg)] 146 lb 9.7 oz (66.5 kg) (04/17 0400) INTAKE / OUTPUT:  Intake/Output Summary (Last 24 hours) at 02/03/14 0725 Last data filed at 02/03/14 0700  Gross per 24 hour  Intake    870 ml  Output   1185 ml  Net   -315 ml    PHYSICAL EXAMINATION: General: no distress HEENT: no sinus tenderness Cardiovascular: regular Lungs: decreased breath sounds, faint wheeze Abdomen:  Soft, non tender Musculoskeletal:  No edema Skin:  No rash Neuro: alert, pleasant, follows commands  LABS:  PULMONARY  Recent Labs Lab 01/28/14 2300 01/29/14 0228 01/29/14 0443 01/30/14 0501 02/01/14 0415  PHART 7.284* 7.344* 7.354 7.378 7.411  PCO2ART 74.0* 62.8* 62.2* 61.5*  60.6*  PO2ART 117.0* 113.0* 115.0* 110.0* 149.0*  HCO3 34.0* 33.3* 33.8* 35.4* 37.7*  TCO2 31.5 30.5 31.0 32.3 33.8  O2SAT 98.1 98.4 98.6 98.3 99.0    CBC  Recent Labs Lab 01/31/14 0552 02/01/14 0425 02/03/14 0400  HGB 10.9* 12.2 13.9  HCT 35.8* 39.5 44.7  WBC 9.9 12.6* 15.2*  PLT 153 174 192   CARDIAC    Recent Labs Lab 01/28/14 0859  TROPONINI <0.30    CHEMISTRY  Recent Labs Lab 01/30/14 0530 01/31/14 0552 02/01/14 0425 02/02/14 0530 02/03/14 0400  NA 145 146 147 151* 143  K 3.0* 4.1 4.0 3.1* 3.6*  CL 101 106 102 97 95*  CO2 36* 34* 38* 41* 38*  GLUCOSE 191* 133* 136* 117* 112*  BUN 35* 30* 39* 45* 43*  CREATININE 0.76 0.63 0.71 0.68 0.70  CALCIUM 8.8 8.6 9.2 9.9 9.5  MG  --  2.5 2.8* 2.8*  --   PHOS  --  2.4 3.9 4.6  --    Estimated Creatinine Clearance: 55.5 ml/min (by C-G formula based on Cr of 0.7).   LIVER  Recent Labs Lab 01/29/14 0515 01/30/14 0530 01/31/14 0552 02/01/14 0425  AST 14 16 19 17   ALT 20 19 26 30   ALKPHOS 78 74 65 75  BILITOT <0.2* <0.2* 0.2* 0.2*  PROT 6.3 6.0 5.3* 6.4  ALBUMIN 3.4* 3.1* 2.7* 3.1*    ENDOCRINE CBG (last 3)   Recent Labs  02/02/14 1147 02/02/14 1727 02/02/14 2154  GLUCAP 141* 116* 178*     IMAGING x48h  No results found.  ASSESSMENT / PLAN:  PULMONARY A:  Acute Exacerbation COPD, Acute resp failure, r/o failure contribution (admitted end Jan 2015 on vent with Flu an and MSSA pna).  P:   Oxygen to keep SpO2 > 88% F/u CXR intermittently DNI Continue solumedrol 40 mg q12h for now Continue brovana, pulmicort, and spiriva with prn albuterol  CARDIOVASCULAR A: Hx of CAD, systolic CHF. P:  Continue ASA, plavix, lopressor Changed lasix to 40 mg po bid on 4/16 Keep CVL in for now  RENAL A:  Hypernatremia >> improved. Hypokalemia. P: F/u and replace electrolytes as needed Keep foley for now 4/17 Add KCL 40 meq daily while on lasix  GASTROINTESTINAL A:  Nutrition. P: Advance  diet as tolerated Protonix for SUP  HEMATOLOGIC A:   No acute issues. P:  F/u CBC intermittently Lovenox for DVT prevention  INFECTIOUS A:   MSSA tracheobronchitis with AECOPD. P:   Day 4/8 Ancef  ENDOCRINE A:   Steroid induced hyperglycemia. P:   SSI  NEUROLOGIC A:   Benzo dep, PTSD from last ETT. Deconditioning. P:   Continue buspar, klonopin, neurontin Changed ativan to prn only 4/16 due to over sedation PT/OT, mobilize  Transfer to medical floor bed 4/17 >> keep on PCCM service.  Chesley Mires, MD [facility] Pulmonary/Critical Care 02/03/2014, 7:25 AM Pager:  [phone] After 3pm call: [phone]

## 2014-02-03 NOTE — Discharge Summary (Signed)
Physician Discharge Summary       Patient ID: [name] MRN: [id number] DOB/AGE: 1941/07/27 73 y.o.  Admit date: 01/25/2014 Discharge date: 02/06/2014  Discharge Diagnoses:  Principal Problem:   Acute on chronic respiratory failure with hypoxia Active Problems:   CORONARY ARTERY DISEASE, multiple PCIs in the past, cath 11/25/13 with non obstructive disease continue medical therapy   CARDIOMYOPATHY, ISCHEMIC   HYPERTENSION, BENIGN   COPD exacerbation   Anxiety   Dysphagia   Stool incontinence   Physical deconditioning   Post traumatic stress disorder  Detailed Hospital Course:   73 yo female with h/o chronic respiratory failure in the setting of copd on (chronic oxygen at home), just d/c from rehab on April 5 after a hospitalization that occurred in January,  requiring intubation. Presented on 4/9 w/  several days of worsening sob and cough. Pt was weaned off of her steroids, was on 10mg  pred daily up until 4/5. Denied fevers. No n/v/d. No swelling. Exsmoker. She called her daughter b/c she was sob, dtr is RN in ED and noted her moms significant sob over the phone, so called 911. Pt has received iv solumedrol and hour long neb in ED and feels better but still sob. She just got home from rehab about 5 days ago. She has been coughing a lot, was on mucinex at the rehab center but this was stopped last week and her secretions have worsened since. No swelling in her legs. Was admitted to the SDU. Placed O2, nebs, steroids, abx. CT chest was negative for PE. She declined in spite of these measures requiring NIPPV on 4/10 and intubation on 4/11. She had significant trouble with vent synchrony, initially requiring neuro-muscular blockade in effort to safely ventilate her. She made slow improvement over the following days. We resumed diuresis on 4/14 which seemed to have significant improvement and we were able to begin pressure support trials on 4/15. She looked much stronger and passed her  Spontaneous breathing trial on 4/16, she was extubated that day. Following extubation the patient asked for a goals of care meeting. Her daughters were at bedside. She clearly indicated that she did not want to be intubated again in the future. She requested DNI status as well as DNR status. She did instruct [place] that she does desire aggressive care up until measures fail. She would accept NIPPV, IVFs, antibiotics, aggressive treatment, however does not want to  Go on vent and requests DNR status. Family was in agreement. The following days were spent working of strength and rehabilitation. She initially had some difficulty with swallowing but per bedside eval has improved at time of d/c. She remains very debilitated. Possibly due to combination of neuromuscular blockade and systemic steroids. She is in agreement with and from a pulmonary stand-point is appropriate for rehabilitation efforts in the [place] setting. We will transfer her to select where they will assume further care.    Discharge Plan by diagnoses  Acute on chronic respiratory failure due to AECOPD, MSSA purulent bronchitis, further complicated by element of volume overload.  Probable aspiration/Dysphagia   Plan:  Oxygen to keep SpO2 > 88%  F/u CXR intermittently  DNI  Continue solumedrol 40 mg q12h for now, with plan to taper back to off  Continue brovana, pulmicort, and spiriva with prn albuterol Complete 8 days ancef. As of 4/20 day 7/8 Needs SLP follow up at [facility] to reduce aspiration risk Please contact [facility] to assist w/ her care at [facility] (she is  a clinic pt of Dr [name])  Hx of CAD, systolic CHF.  ECHO 11/12/13: Overall LVEF appears to be mild to moderately depressed. There is akinesis of the apex  Plan:  Continue ASA, plavix, lopressor  Changed lasix to 40 mg po bid on 4/16  Keep CVL in for now  Hypernatremia >> improved.  Hypokalemia.   Plan:  F/u and replace electrolytes as needed  Keep foley for now  4/17 Add KCL 40  meq daily while on lasix  Steroid induced hyperglycemia.   Plan:  SSI   Benzo dep, PTSD from last ETT.  Deconditioning...  Muscular weakness.  > had been on steroids and neuromuscular blockade.   Plan:  Continue buspar, klonopin, neurontin  Changed ativan to prn only 4/16 due to over sedation  Needs extensive PT and OT   Stool incontinence  Plan Change Miralax to to PRN Add pro-biotic  May need C diff assessment if persists in spite of this     Skin break down in setting of stool incontinence  Plan Needs barrier cream Would recommend wound care nurse follow up  GOALS of CARE Spoke at length with the patient. Her daughter Caryl Pina, as well as her sisters were all present. The patient has requested that we do not use the ventilator again. She and her family have requested limited code status. We will continue aggressive non-invasive medical care with limited code status.   She is OK with short term BIPAP, pressors, IVFs. No CPR, defib, intubation or cardioversion. The orders in the chart have been changed to represent this.     Significant Hospital tests/ studies/ interventions and procedures   4/9 - admission SOB  4/10 - declined requiring NIMV  4/11- severe dyschrony, paralyzed  4/15 - family discussion >> no CPR, no intubation, no defibrillation/cardioversion  4/17 - to floor bed   STUDIES:  4/9 CT chest - no PE, emphysema changes, stenosis of proximal Lt subclavian artery   LINES / TUBES:  4/11 ETT>>>4/15  4/11 [place]>>>   CULTURES:  4/8 BC>>>negative  4/12 sputum>>>abundant MSSA  4/13 Resp Virus panel >>negative   ANTIBIOTICS:  Levofloxacin 4/9>>>4/14  vanc 4/13>>>4/14 Ancef 4/14>>>  Discharge Exam: BP 115/76  Pulse 67  Temp(Src) 97.6 F (36.4 C) (Oral)  Resp 18  Ht 5\' 1"  (1.549 m)  Wt 65.862 kg (145 lb 3.2 oz)  BMI 27.45 kg/m2  SpO2 99%  General: no distress  HEENT: no sinus tenderness  Cardiovascular: regular  Lungs: decreased breath sounds,  faint wheeze  Abdomen: Soft, non tender  Musculoskeletal: No edema  Skin: No rash  Neuro: alert, pleasant, follows commands   Labs at discharge Lab Results  Component Value Date   CREATININE 0.67 02/06/2014   BUN 28* 02/06/2014   NA 143 02/06/2014   K 3.6* 02/06/2014   CL 98 02/06/2014   CO2 37* 02/06/2014   Lab Results  Component Value Date   WBC 15.2* 02/03/2014   HGB 13.9 02/03/2014   HCT 44.7 02/03/2014   MCV 87.3 02/03/2014   PLT 192 02/03/2014   Lab Results  Component Value Date   ALT 30 02/01/2014   AST 17 02/01/2014   ALKPHOS 75 02/01/2014   BILITOT 0.2* 02/01/2014   Lab Results  Component Value Date   INR 1.04 11/09/2013   INR 1.01 05/31/2013   INR 1.00 08/31/2012    Current radiology studies Dg Chest 2 View  02/04/2014   CLINICAL DATA:  COPD exacerbation  EXAM: CHEST  2  VIEW  COMPARISON:  February 01, 2014  FINDINGS: Endotracheal tube is been removed. Central catheter tip is in the superior vena cava. Nasogastric tube is been removed. No pneumothorax.  There is underlying emphysema. There is no edema or consolidation. The heart size is within normal limits. Pulmonary vascularity reflects underlying emphysema. No adenopathy. 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  IMPRESSION: Central catheter tip is in the superior cava. No pneumothorax. Underlying emphysema. No edema or consolidation.   Electronically Signed   By: Lowella Grip M.D.   On: 02/04/2014 17:21    Disposition:  03-Skilled Nursing Facility      Discharge Orders   Future Appointments Provider Department Dept Phone   02/07/2014 3:00 PM Hendricks Limes, MD [facility] [phone]   03/24/2014 12:00 PM Burnell Blanks, MD [place] [facility] [phone]   05/15/2014 10:45 AM [facility] [phone]   05/15/2014 11:15 AM Amy Milda Smart, PA-C [facility] [phone]   Future Orders  Complete By Expires   Diet - low sodium heart healthy  As directed    Discharge instructions  As directed    Increase activity slowly  As directed        Medication List    STOP taking these medications       albuterol 108 (90 BASE) MCG/ACT inhaler  Commonly known as:  PROVENTIL HFA;VENTOLIN HFA  Replaced by:  albuterol (2.5 MG/3ML) 0.083% nebulizer solution     Fluticasone-Salmeterol 250-50 MCG/DOSE Aepb  Commonly known as:  ADVAIR      TAKE these medications       acetaminophen 325 MG tablet  Commonly known as:  TYLENOL  Take 2 tablets (650 mg total) by mouth every 6 (six) hours as needed for mild pain, fever or headache.     albuterol (2.5 MG/3ML) 0.083% nebulizer solution  Commonly known as:  PROVENTIL  Take 2.5 mg by nebulization every 6 (six) hours as needed for wheezing or shortness of breath.     albuterol (2.5 MG/3ML) 0.083% nebulizer solution  Commonly known as:  PROVENTIL  Take 3 mLs (2.5 mg total) by nebulization every 2 (two) hours as needed for wheezing or shortness of breath.     anastrozole 1 MG tablet  Commonly known as:  ARIMIDEX  Take 1 mg by mouth daily.     antiseptic oral rinse Liqd  15 mLs by Mouth Rinse route 2 times daily at 12 noon and 4 pm.     arformoterol 15 MCG/2ML Nebu  Commonly known as:  BROVANA  Take 2 mLs (15 mcg total) by nebulization 2 (two) times daily.     aspirin EC 81 MG tablet  Take 81 mg by mouth daily.     B-COMPLEX/B-12 PO  Take 1 tablet by mouth daily.     budesonide 0.5 MG/2ML nebulizer solution  Commonly known as:  PULMICORT  Take 2 mLs (0.5 mg total) by nebulization 2 (two) times daily.     busPIRone 15 MG tablet  Commonly known as:  BUSPAR  Take 15 mg by mouth 2 (two) times daily.     CALCIUM 600 + D PO  Take 0.5 tablets by mouth 2 (two) times daily.     ceFAZolin 1-5 GM-%  Commonly known as:  ANCEF  Inject 50 mLs (1 g total) into the vein every 8 (eight) hours.     clonazePAM 1 MG  tablet  Commonly  known as:  KLONOPIN  Take 1 tablet (1 mg total) by mouth every 12 (twelve) hours.     clopidogrel 75 MG tablet  Commonly known as:  PLAVIX  Take 1 tablet (75 mg total) by mouth daily.     CRANBERRY PO  Take 1 tablet by mouth daily.     DSS 100 MG Caps  Take 100 mg by mouth daily.     enoxaparin 40 MG/0.4ML injection  Commonly known as:  LOVENOX  Inject 0.4 mLs (40 mg total) into the skin daily.     feeding supplement (ENSURE COMPLETE) Liqd  Take 237 mLs by mouth 2 (two) times daily between meals.     Fish Oil 1200 MG Caps  Take 1 capsule by mouth daily.     furosemide 40 MG tablet  Commonly known as:  LASIX  Take 1 tablet (40 mg total) by mouth 2 (two) times daily.     gabapentin 300 MG capsule  Commonly known as:  NEURONTIN  Take 300 mg by mouth at bedtime.     HYDROcodone-homatropine 5-1.5 MG/5ML syrup  Commonly known as:  HYCODAN  Take 5 mLs by mouth 2 (two) times daily as needed for cough.     insulin aspart 100 UNIT/ML injection  Commonly known as:  novoLOG  Inject 0-20 Units into the skin 3 (three) times daily with meals.     lidocaine 5 %  Commonly known as:  LIDODERM  Place 1 patch onto the skin at bedtime. Remove & Discard patch within 12 hours or as directed by MD     LORazepam 2 MG/ML injection  Commonly known as:  ATIVAN  Inject 0.25 mLs (0.5 mg total) into the vein every 4 (four) hours as needed for anxiety.     methylPREDNISolone sodium succinate 40 mg/mL injection  Commonly known as:  SOLU-MEDROL  Inject 1 mL (40 mg total) into the vein every 12 (twelve) hours.     metoprolol tartrate 25 MG tablet  Commonly known as:  LOPRESSOR  Take 12.5 mg by mouth daily.     multivitamin with minerals Tabs tablet  Take 0.5 tablets by mouth 2 (two) times daily.     nystatin 100000 UNIT/ML suspension  Commonly known as:  MYCOSTATIN  Take 5 mLs (500,000 Units total) by mouth 2 (two) times daily.     oxybutynin 5 MG tablet  Commonly known as:  DITROPAN    Take 5 mg by mouth at bedtime.     pantoprazole 40 MG tablet  Commonly known as:  PROTONIX  Take 1 tablet (40 mg total) by mouth 2 (two) times daily.     polyethylene glycol packet  Commonly known as:  MIRALAX / GLYCOLAX  Take 17 g by mouth daily as needed.     potassium chloride SA 20 MEQ tablet  Commonly known as:  K-DUR,KLOR-CON  Take 20 mEq by mouth daily.     saccharomyces boulardii 250 MG capsule  Commonly known as:  FLORASTOR  Take 1 capsule (250 mg total) by mouth 2 (two) times daily.     simvastatin 40 MG tablet  Commonly known as:  ZOCOR  Take 40 mg by mouth daily.     tiotropium 18 MCG inhalation capsule  Commonly known as:  SPIRIVA  Place 18 mcg into inhaler and inhale daily.     traMADol 50 MG tablet  Commonly known as:  ULTRAM  Take 75 mg by mouth every 6 (six) hours as  needed for moderate pain.     VITAMIN B-6 PO  Take 1 tablet by mouth daily.     Vitamin D 2000 UNITS Caps  Take 1 capsule by mouth 2 (two) times daily.         Discharged Condition: fair  Physician Statement:   The Patient was personally examined, the discharge assessment and plan has been personally reviewed and I agree with ACNP Babcock's assessment and plan. > 30 minutes of time have been dedicated to discharge assessment, planning and discharge instructions.   Signed: Erick Colace 02/06/2014, 10:30 AM   Patient seen and examined, agree with above note.  I dictated the care and orders written for this patient under my direction.  Rush Farmer, MD [phone]

## 2014-02-03 NOTE — Evaluation (Signed)
Occupational Therapy Evaluation Patient Details Name: [name] MRN: [id number] DOB: 07-16-1941 Today's Date: 02/03/2014    History of Present Illness Pt is a 73 yo female admitted with SOB and cough after being recently d/c'd from [facility] 5 days prior to admission.     Pt with h/o COPD and is on chronic O2.     Clinical Impression   Pt was admitted for SOB/cough and was intubated in ICU.  She moved to regular floor today and was seen for bed level evaluation as she fatiques easily and was tired this afternoon.  Pt has profound weakness in bil UEs and requires total A with all adls (A x 2 for mobility/LB adls).  She will benefit from skilled OT to increase activity tolerance and independence with adls. Prior to this admission, pt needed some assistance with adls:  She had not returned to baseline per family.      Follow Up Recommendations  LTACH    Equipment Recommendations   (to be further assessed)    Recommendations for Other Services       Precautions / Restrictions Precautions Precautions: Fall Precaution Comments: monitor sats Restrictions Weight Bearing Restrictions: No      Mobility Bed Mobility                  Transfers                      Balance                                            ADL   Eating/Feeding: Bed level;Total assistance   Grooming: Total assistance;Bed level (see comments below)                                 General ADL Comments: Pt seen for bed level eval. She gets very fatiqued with transfers and needs extensive rest after them.  Pt has profound weakness in bil UEs.  Assessed for AE for self-feeding.  Sisters present and are anxious to help in any way.  Provided foam for spoon and simulated use; had to prop elbow onto pillow to position for optimal use.  Educated that they could try up to 5 bites depending upon fatique as I don't want pt to use all energy for self-feeding and  compromise nutrition due to fatique.  also tried bil cup.  Pt can loosely grip handles and place cup on chest to drink with straw.  Pt needs total A for UB adls and total A x 2 for LB adls at this time.       Vision                     Perception     Praxis      Pertinent Vitals/Pain No c/o pain except for R shoulder with movement     Hand Dominance Right   Extremity/Trunk Assessment Upper Extremity Assessment Upper Extremity Assessment: RUE deficits/detail;LUE deficits/detail RUE Deficits / Details: shoulder painful; was due for TSA.  Only lifted to 90 AAROM.  Pt has decreased AROM throughout:  strength grossly 2/5; hand 3-/5 LUE Deficits / Details: AAROM grossly 2/5; hand 3-/5.  Decreased eccentric control.  Educated family on [place] in gravity eliminated Pt is limited bil  with wrist extension to neutral           Communication Communication Communication: No difficulties   Cognition Arousal/Alertness: Awake/alert Behavior During Therapy: WFL for tasks assessed/performed Overall Cognitive Status: Within Functional Limits for tasks assessed                     General Comments       Exercises   Other Exercises Other Exercises: performed 5 reps each bilaterally of elbow flexion/extension in gravity eliminated plane, wrist flexion to neutral in gravity eliminated plane, and pt performed 5 reps of AROM finger flexion/extension. Other Exercises: 5 reps of AAROM to L shoulder flexors.  Did not perform R due to pain   Shoulder Instructions      Home Living Family/patient expects to be discharged to:: Private residence Living Arrangements: Alone                               Additional Comments: will need snf/ltach      Prior Functioning/Environment Level of Independence: Independent with assistive device(s)             OT Diagnosis: Generalized weakness   OT Problem List: Decreased strength;Decreased activity tolerance;Decreased  knowledge of use of DME or AE;Impaired UE functional use (balance NT)   OT Treatment/Interventions: Self-care/ADL training;Therapeutic exercise;DME and/or AE instruction;Therapeutic activities;Patient/family education;Balance training    OT Goals(Current goals can be found in the care plan section) Acute Rehab OT Goals Patient Stated Goal: get strength back OT Goal Formulation: With patient/family Time For Goal Achievement: 02/17/14 Potential to Achieve Goals: Good ADL Goals Pt Will Perform Eating: with min assist;with adaptive utensils;bed level;sitting (one item with elbow supported to decrease shoulder demand) Pt Will Perform Grooming: with min assist;sitting Pt Will Transfer to Toilet: with min assist;stand pivot transfer;bedside commode Additional ADL Goal #1: Pt will tolerate 10 reps of AAROM to all joints bilaterally except for R shoulder  OT Frequency: Min 2X/week   Barriers to D/C:            Co-evaluation              End of Session    Activity Tolerance: Patient tolerated treatment well Patient left: in bed;with call bell/phone within reach;with family/visitor present   Time: 0814-4818 OT Time Calculation (min): 21 min Charges:  OT General Charges $OT Visit: 1 Procedure OT Evaluation $Initial OT Evaluation Tier I: 1 Procedure OT Treatments $Self Care/Home Management : 8-22 mins G-Codes:    Lesle Chris 18-Feb-2014, 4:25 PM  Lesle Chris, OTR/L [phone] Feb 18, 2014

## 2014-02-03 NOTE — Progress Notes (Signed)
[facility] ADULT ICU REPLACEMENT PROTOCOL FOR AM LAB REPLACEMENT ONLY  The patient does apply for the [facility] Adult ICU Electrolyte Replacment Protocol based on the criteria listed below:   1. Is GFR >/= 40 ml/min? yes  Patient's GFR today is 85 2. Is urine output >/= 0.5 ml/kg/hr for the last 6 hours? yes Patient's UOP is 0.7 ml/kg/hr 3. Is BUN < 60 mg/dL? yes  Patient's BUN today is 43 4. Abnormal electrolyte(s): K 3.6 5. Ordered repletion with:  6. If a panic level lab has been reported, has the CCM MD in charge been notified? no.   Physician:    Billy Fischer 02/03/2014 5:43 AM

## 2014-02-03 NOTE — Progress Notes (Signed)
Spoke with Sonia Baller from [facility] about pt being eligible to go there. At this time she is but they do not have open beds. They will follow up with the case manager if a bed becomes available over the weekend. I have spoken with Dr Halford Chessman and made him aware of this.  Allene Dillon RN BSN  [phone]

## 2014-02-03 NOTE — Progress Notes (Addendum)
Clinical Social Work Department CLINICAL SOCIAL WORK PLACEMENT NOTE 02/03/2014  Patient:  [name], [name]  Account Number:  [id number] Admit date:  01/25/2014  Clinical Social Worker:  Ulyess Blossom  Date/time:  02/03/2014 04:20 PM  Clinical Social Work is seeking post-discharge placement for this patient at the following level of care:   SKILLED NURSING   (*CSW will update this form in Epic as items are completed)   02/03/2014  Patient/family provided with [place] Department of Clinical Social Work's list of facilities offering this level of care within the geographic area requested by the patient (or if unable, by the patient's family).  02/03/2014  Patient/family informed of their freedom to choose among providers that offer the needed level of care, that participate in Medicare, Medicaid or managed care program needed by the patient, have an available bed and are willing to accept the patient.  02/03/2014  Patient/family informed of [facility]' ownership interest in [facility], as well as of the fact that they are under no obligation to receive care at this facility.  PASARR submitted to EDS on 02/03/2014 PASARR number received from EDS on 02/03/2014  FL2 transmitted to all facilities in geographic area requested by pt/family on  02/03/2014 FL2 transmitted to all facilities within larger geographic area on   Patient informed that his/her managed care company has contracts with or will negotiate with  certain facilities, including the following:     Patient/family informed of bed offers received:   Patient chooses bed at  Physician recommends and patient chooses bed at    Patient to be transferred to  on   Patient to be transferred to facility by   The following physician request were entered in Epic:   Additional Comments:  Alison Murray, MSW, LCSW Clinical Social Work [phone]  02/06/14- Patient to DC to [facility]

## 2014-02-03 NOTE — Progress Notes (Signed)
02/03/14 1623  OT Visit Information  Last OT Received On 02/03/14  OT Time Calculation  OT Start Time 1555  OT Stop Time 1603  OT Time Calculation (min) 8 min  Precautions  Precautions Fall  Precaution Comments monitor sats  Cognition  Arousal/Alertness Awake/alert  Behavior During Therapy WFL for tasks assessed/performed  Overall Cognitive Status Within Functional Limits for tasks assessed  Other Exercises  Other Exercises provided written HEP for gravity eliminated AAROM for L shoulder, Bil elbows and wrists and AROM for hands.  Educated daughter, who practiced positioning on my arm and one of pt's sisters.  they verbalize understanding.  Recommended 3-5 reps of each exercise one to three times a day.  Sister was performing ankle pumps on pt when I arrived.  Educated to make sure that pt does the movement; they are eliminating gravity.   OT Assessment/Plan  OT Frequency Min 2X/week  Follow Up Recommendations LTACH  OT Equipment (to be further assessed)  ADL Goals              OT General Charges  $OT Visit 1 Procedure  OT Treatments  $Therapeutic Exercise 8-22 mins  Lesle Chris, OTR/L [phone] 02/03/2014

## 2014-02-04 ENCOUNTER — Inpatient Hospital Stay (HOSPITAL_COMMUNITY): Payer: Medicare Other

## 2014-02-04 LAB — GLUCOSE, CAPILLARY
GLUCOSE-CAPILLARY: 102 mg/dL — AB (ref 70–99)
GLUCOSE-CAPILLARY: 132 mg/dL — AB (ref 70–99)
Glucose-Capillary: 164 mg/dL — ABNORMAL HIGH (ref 70–99)
Glucose-Capillary: 114 mg/dL — ABNORMAL HIGH (ref 70–99)
Glucose-Capillary: 156 mg/dL — ABNORMAL HIGH (ref 70–99)

## 2014-02-04 LAB — BASIC METABOLIC PANEL
BUN: 34 mg/dL — ABNORMAL HIGH (ref 6–23)
CO2: 39 meq/L — AB (ref 19–32)
Calcium: 9.4 mg/dL (ref 8.4–10.5)
Chloride: 93 mEq/L — ABNORMAL LOW (ref 96–112)
Creatinine, Ser: 0.81 mg/dL (ref 0.50–1.10)
GFR calc Af Amer: 82 mL/min — ABNORMAL LOW (ref >90)
GFR calc non Af Amer: 71 mL/min — ABNORMAL LOW (ref >90)
GLUCOSE: 113 mg/dL — AB (ref 70–99)
POTASSIUM: 3.3 meq/L — AB (ref 3.7–5.3)
SODIUM: 141 meq/L (ref 137–147)

## 2014-02-04 MED ORDER — SODIUM CHLORIDE 0.9 % IJ SOLN
10.0000 mL | INTRAMUSCULAR | Status: DC | PRN
  Administered 2014-02-04: 30 mL
  Administered 2014-02-05: 20 mL
  Administered 2014-02-06 (×2): 10 mL
  Administered 2014-02-06: 20 mL
  Administered 2014-02-06: 10 mL

## 2014-02-04 NOTE — Progress Notes (Signed)
Name: [name] MRN: [id number] DOB: [birth year]    ADMISSION DATE:  01/25/2014 CONSULTATION DATE:  01/28/14  REFERRING MD :  Dr Jerilee Hoh PRIMARY SERVICE: [facility]>>>PCCM  CHIEF COMPLAINT:  Resp failure  BRIEF PATIENT DESCRIPTION:  73 yr old COPD (RA pt in office, recent admission requiring ETT)admitted SOB, now in distress , NIMV failed -ETT  SIGNIFICANT EVENTS: 4/9 - admission SOB 4/10 - declined requiring NIMV 4/11- severe dyschrony, paralyzed 4/15 - family discussion >> no CPR, no intubation, no defibrillation/cardioversion 4/17 - to floor bed  STUDIES: 4/9 CT chest - no PE, emphysema changes, stenosis of proximal Lt subclavian artery  LINES / TUBES: 4/11 ETT>>>4/15 4/11 [place]>>>  CULTURES: 4/8  BC>>>negative 4/12 sputum>>>abundant MSSA 4/13 Resp Virus panel >>negative  ANTIBIOTICS: Levofloxacin 4/9>>>4/14 vanc 4/13>>>4/14 Ancef 4/14>>>  SUBJECTIVE: Sisters here.  Complains room is hot Has some wheeze.  Denies chest pain.  Feels weak. Nurse observed cough repeatedly at meals.  VITAL SIGNS: Temp:  [96.8 F (36 C)-98.1 F (36.7 C)] 97.8 F (36.6 C) (04/18 0548) Pulse Rate:  [78-103] 78 (04/18 0548) Resp:  [20-22] 20 (04/18 0548) BP: (115-137)/(71-87) 123/71 mmHg (04/18 0548) SpO2:  [93 %-98 %] 93 % (04/18 0932) Weight:  [144 lb 6.4 oz (65.499 kg)-148 lb 6.4 oz (67.314 kg)] 144 lb 6.4 oz (65.499 kg) (04/18 0600) INTAKE / OUTPUT:  Intake/Output Summary (Last 24 hours) at 02/04/14 1142 Last data filed at 02/04/14 0900  Gross per 24 hour  Intake   1020 ml  Output   2401 ml  Net  -1381 ml    PHYSICAL EXAMINATION: General: no distress HEENT: no sinus tenderness. Voice weak/ hoarse, improved with distraction. No stridor Cardiovascular: regular, no murmur Lungs: decreased breath sounds. R base few rhonchi Abdomen:  Soft, non tender Musculoskeletal:  No edema. Sat in bed with assistance Skin:  No rash Neuro: alert, pleasant, follows  commands  LABS:  PULMONARY  Recent Labs Lab 01/28/14 2300 01/29/14 0228 01/29/14 0443 01/30/14 0501 02/01/14 0415  PHART 7.284* 7.344* 7.354 7.378 7.411  PCO2ART 74.0* 62.8* 62.2* 61.5* 60.6*  PO2ART 117.0* 113.0* 115.0* 110.0* 149.0*  HCO3 34.0* 33.3* 33.8* 35.4* 37.7*  TCO2 31.5 30.5 31.0 32.3 33.8  O2SAT 98.1 98.4 98.6 98.3 99.0    CBC  Recent Labs Lab 01/31/14 0552 02/01/14 0425 02/03/14 0400  HGB 10.9* 12.2 13.9  HCT 35.8* 39.5 44.7  WBC 9.9 12.6* 15.2*  PLT 153 174 192   CARDIAC   No results found for this basename: TROPONINI,  in the last 168 hours  CHEMISTRY  Recent Labs Lab 01/31/14 0552 02/01/14 0425 02/02/14 0530 02/03/14 0400 02/04/14 0740  NA 146 147 151* 143 141  K 4.1 4.0 3.1* 3.6* 3.3*  CL 106 102 97 95* 93*  CO2 34* 38* 41* 38* 39*  GLUCOSE 133* 136* 117* 112* 113*  BUN 30* 39* 45* 43* 34*  CREATININE 0.63 0.71 0.68 0.70 0.81  CALCIUM 8.6 9.2 9.9 9.5 9.4  MG 2.5 2.8* 2.8*  --   --   PHOS 2.4 3.9 4.6  --   --    Estimated Creatinine Clearance: 54.4 ml/min (by C-G formula based on Cr of 0.81).   LIVER  Recent Labs Lab 01/29/14 0515 01/30/14 0530 01/31/14 0552 02/01/14 0425  AST 14 16 19 17   ALT 20 19 26 30   ALKPHOS 78 74 65 75  BILITOT <0.2* <0.2* 0.2* 0.2*  PROT 6.3 6.0 5.3* 6.4  ALBUMIN 3.4* 3.1* 2.7* 3.1*  ENDOCRINE CBG (last 3)   Recent Labs  02/03/14 1636 02/03/14 2111 02/04/14 0729  GLUCAP 125* 164* 102*     IMAGING x48h  No results found.  ASSESSMENT / PLAN:  PULMONARY A:  Acute Exacerbation COPD, Acute resp failure, r/o failure contribution (admitted end Jan 2015 on vent with Flu an and MSSA pna). Few rhonchi R base. Cough with eating- risk aspiration. P:   Oxygen to keep SpO2 > 88% F/u CXR intermittently> 4/18 DNI Continue solumedrol 40 mg q12h for now Continue brovana, pulmicort, and spiriva with prn albuterol SLP eval and treat for possible aspiration  CARDIOVASCULAR A: Hx of CAD,  systolic CHF. P:  Continue ASA, plavix, lopressor Changed lasix to 40 mg po bid on 4/16 Keep CVL in for now  RENAL A:  Hypernatremia >> improved. Hypokalemia. P: F/u and replace electrolytes as needed Keep foley for now 4/17 Add KCL 40 meq daily while on lasix. Starts 4/18. Note 4/18 K 3.3  GASTROINTESTINAL A:  Nutrition. P: Advance diet as tolerated Protonix for SUP  HEMATOLOGIC A:   No acute issues. Leukocytosis on steroids P:  F/u CBC intermittently Lovenox for DVT prevention  INFECTIOUS A:   MSSA tracheobronchitis with AECOPD. P:   Day 5/8 Ancef  ENDOCRINE A:   Steroid induced hyperglycemia. P:   SSI  NEUROLOGIC A:   Benzo dep, PTSD from last ETT. Deconditioning. P:   Continue buspar, klonopin, neurontin Changed ativan to prn only 4/16 due to over sedation PT/OT, mobilize  Transfered to medical floor bed 4/17 >> keep on PCCM service.  CD Annamaria Boots, MD [facility] Pulmonary/Critical Care 02/04/2014, 11:42 AM Pager:  [phone] Mobile [phone] After 3pm call: [phone]

## 2014-02-05 LAB — GLUCOSE, CAPILLARY
GLUCOSE-CAPILLARY: 111 mg/dL — AB (ref 70–99)
GLUCOSE-CAPILLARY: 232 mg/dL — AB (ref 70–99)
Glucose-Capillary: 103 mg/dL — ABNORMAL HIGH (ref 70–99)
Glucose-Capillary: 187 mg/dL — ABNORMAL HIGH (ref 70–99)

## 2014-02-05 NOTE — Progress Notes (Signed)
SLP Cancellation Note  Patient Details Name: [name] MRN: [id number] DOB: November 18, 1940   Cancelled treatment:  ST to complete BSE on 02/06/14.   Sharman Crate [place], [place] Kailie Polus 02/05/2014, 5:59 PM

## 2014-02-05 NOTE — Progress Notes (Signed)
Name: [name] MRN: [id number] DOB: 1941/06/30    ADMISSION DATE:  01/25/2014 CONSULTATION DATE:  01/28/14  REFERRING MD :  Dr Jerilee Hoh PRIMARY SERVICE: [facility]>>>PCCM  CHIEF COMPLAINT:  Resp failure  BRIEF PATIENT DESCRIPTION:  73 yr old COPD (RA pt in office, recent admission requiring ETT)admitted SOB, now in distress , NIMV failed -ETT  SIGNIFICANT EVENTS: 4/9 - admission SOB 4/10 - declined requiring NIMV 4/11- severe dyschrony, paralyzed 4/15 - family discussion >> no CPR, no intubation, no defibrillation/cardioversion 4/17 - to floor bed  STUDIES: 4/9 CT chest - no PE, emphysema changes, stenosis of proximal Lt subclavian artery  LINES / TUBES: 4/11 ETT>>>4/15 4/11 [place]>>>  CULTURES: 4/8  BC>>>negative 4/12 sputum>>>abundant MSSA 4/13 Resp Virus panel >>negative  ANTIBIOTICS: Levofloxacin 4/9>>>4/14 vanc 4/13>>>4/14 Ancef 4/14>>>  SUBJECTIVE: Numerous family here. Pt spoke clearly until I complimented her on sounding better, then she began to whisper again.  VITAL SIGNS: Temp:  [97.3 F (36.3 C)-98.2 F (36.8 C)] 98.2 F (36.8 C) (04/19 0533) Pulse Rate:  [63-87] 63 (04/19 0533) Resp:  [16] 16 (04/19 0533) BP: (110-130)/(60-66) 130/60 mmHg (04/19 0533) SpO2:  [93 %-100 %] 98 % (04/19 0936) Weight:  [145 lb 3.2 oz (65.862 kg)] 145 lb 3.2 oz (65.862 kg) (04/19 0442) INTAKE / OUTPUT:  Intake/Output Summary (Last 24 hours) at 02/05/14 1237 Last data filed at 02/05/14 0725  Gross per 24 hour  Intake    480 ml  Output   2151 ml  Net  -1671 ml    PHYSICAL EXAMINATION: General: no distress HEENT: no sinus tenderness. Voice weak/ hoarse, improved with distraction. No stridor Cardiovascular: regular, no murmur Lungs: decreased breath sounds. No cough/wheeze/ rhonchi, unlabored Abdomen:  Soft, non tender Musculoskeletal:  No edema.  Skin:  No rash Neuro: alert, pleasant, follows commands  LABS:  PULMONARY  Recent Labs Lab 01/30/14 0501  02/01/14 0415  PHART 7.378 7.411  PCO2ART 61.5* 60.6*  PO2ART 110.0* 149.0*  HCO3 35.4* 37.7*  TCO2 32.3 33.8  O2SAT 98.3 99.0    CBC  Recent Labs Lab 01/31/14 0552 02/01/14 0425 02/03/14 0400  HGB 10.9* 12.2 13.9  HCT 35.8* 39.5 44.7  WBC 9.9 12.6* 15.2*  PLT 153 174 192   CARDIAC   No results found for this basename: TROPONINI,  in the last 168 hours  CHEMISTRY  Recent Labs Lab 01/31/14 0552 02/01/14 0425 02/02/14 0530 02/03/14 0400 02/04/14 0740  NA 146 147 151* 143 141  K 4.1 4.0 3.1* 3.6* 3.3*  CL 106 102 97 95* 93*  CO2 34* 38* 41* 38* 39*  GLUCOSE 133* 136* 117* 112* 113*  BUN 30* 39* 45* 43* 34*  CREATININE 0.63 0.71 0.68 0.70 0.81  CALCIUM 8.6 9.2 9.9 9.5 9.4  MG 2.5 2.8* 2.8*  --   --   PHOS 2.4 3.9 4.6  --   --    Estimated Creatinine Clearance: 54.5 ml/min (by C-G formula based on Cr of 0.81).   LIVER  Recent Labs Lab 01/30/14 0530 01/31/14 0552 02/01/14 0425  AST 16 19 17   ALT 19 26 30   ALKPHOS 74 65 75  BILITOT <0.2* 0.2* 0.2*  PROT 6.0 5.3* 6.4  ALBUMIN 3.1* 2.7* 3.1*    ENDOCRINE CBG (last 3)   Recent Labs  02/04/14 2138 02/05/14 0728 02/05/14 1127  GLUCAP 132* 111* 232*     IMAGING x48h  Dg Chest 2 View  02/04/2014   CLINICAL DATA:  COPD exacerbation  EXAM: CHEST  2 VIEW  COMPARISON:  February 01, 2014  FINDINGS: Endotracheal tube is been removed. Central catheter tip is in the superior vena cava. Nasogastric tube is been removed. No pneumothorax.  There is underlying emphysema. There is no edema or consolidation. The heart size is within normal limits. Pulmonary vascularity reflects underlying emphysema. No adenopathy. 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  IMPRESSION: Central catheter tip is in the superior cava. No pneumothorax. Underlying emphysema. No edema or consolidation.   Electronically Signed   By: Lowella Grip M.D.   On: 02/04/2014 17:21    ASSESSMENT / PLAN:  PULMONARY A:  Acute  Exacerbation COPD, Acute resp failure, r/o failure contribution (admitted end Jan 2015 on vent with Flu an and MSSA pna). Few rhonchi R base. Cough with eating- risk aspiration.. CO2 retainer P:   Oxygen to keep SpO2 > 88% F/u CXR intermittently> 4/18 COPD/ NAD DNI Continue solumedrol 40 mg q12h for now Continue brovana, pulmicort, and spiriva with prn albuterol SLP eval and treat for possible aspiration- pending  CARDIOVASCULAR A: Hx of CAD, systolic CHF. P:  Continue ASA, plavix, lopressor Changed lasix to 40 mg po bid on 4/16 Keep CVL in for now  RENAL A:  Hypernatremia >> improved. Hypokalemia. P: F/u and replace electrolytes as needed Keep foley for now 4/17 Add KCL 40 meq daily while on lasix. Starts 4/18. Note 4/18 K 3.3BMET 4/20  GASTROINTESTINAL A:  Nutrition. P: Advance diet as tolerated Protonix for SUP  HEMATOLOGIC A:   No acute issues. Leukocytosis on steroids P:  F/u CBC intermittently Lovenox for DVT prevention  INFECTIOUS A:   MSSA tracheobronchitis with AECOPD. P:   Day 6/8 Ancef  ENDOCRINE A:   Steroid induced hyperglycemia. P:   SSI  NEUROLOGIC A:   Benzo dep, PTSD from last ETT. Deconditioning. P:   Continue buspar, klonopin, neurontin Changed ativan to prn only 4/16 due to over sedation PT/OT, mobilize  Transfered to medical floor bed 4/17 >> keep on PCCM service.  CD Annamaria Boots, MD [facility] Pulmonary/Critical Care 02/05/2014, 12:37 PM Pager:  [phone] Mobile [phone] After 3pm call: [phone]

## 2014-02-06 ENCOUNTER — Inpatient Hospital Stay
Admission: AD | Admit: 2014-02-06 | Discharge: 2014-02-27 | Disposition: A | Discharge status: Skilled Nursing Facility | Payer: Self-pay | Source: Ambulatory Visit | Attending: Internal Medicine | Admitting: Internal Medicine

## 2014-02-06 DIAGNOSIS — J4489 Other specified chronic obstructive pulmonary disease: Secondary | ICD-10-CM

## 2014-02-06 DIAGNOSIS — H8110 Benign paroxysmal vertigo, unspecified ear: Secondary | ICD-10-CM

## 2014-02-06 DIAGNOSIS — R1310 Dysphagia, unspecified: Secondary | ICD-10-CM

## 2014-02-06 DIAGNOSIS — R5381 Other malaise: Secondary | ICD-10-CM

## 2014-02-06 DIAGNOSIS — F419 Anxiety disorder, unspecified: Secondary | ICD-10-CM

## 2014-02-06 DIAGNOSIS — J9621 Acute and chronic respiratory failure with hypoxia: Secondary | ICD-10-CM

## 2014-02-06 DIAGNOSIS — M549 Dorsalgia, unspecified: Secondary | ICD-10-CM

## 2014-02-06 DIAGNOSIS — R0902 Hypoxemia: Secondary | ICD-10-CM

## 2014-02-06 DIAGNOSIS — F4310 Post-traumatic stress disorder, unspecified: Secondary | ICD-10-CM

## 2014-02-06 DIAGNOSIS — J449 Chronic obstructive pulmonary disease, unspecified: Secondary | ICD-10-CM

## 2014-02-06 DIAGNOSIS — J441 Chronic obstructive pulmonary disease with (acute) exacerbation: Secondary | ICD-10-CM

## 2014-02-06 DIAGNOSIS — R159 Full incontinence of feces: Secondary | ICD-10-CM

## 2014-02-06 DIAGNOSIS — K251 Acute gastric ulcer with perforation: Secondary | ICD-10-CM

## 2014-02-06 LAB — BASIC METABOLIC PANEL
BUN: 28 mg/dL — AB (ref 6–23)
CO2: 37 meq/L — AB (ref 19–32)
CREATININE: 0.67 mg/dL (ref 0.50–1.10)
Calcium: 8.9 mg/dL (ref 8.4–10.5)
Chloride: 98 mEq/L (ref 96–112)
GFR calc Af Amer: >90 mL/min (ref >90)
GFR, EST NON AFRICAN AMERICAN: 86 mL/min — AB (ref >90)
GLUCOSE: 138 mg/dL — AB (ref 70–99)
Potassium: 3.6 mEq/L — ABNORMAL LOW (ref 3.7–5.3)
SODIUM: 143 meq/L (ref 137–147)

## 2014-02-06 LAB — GLUCOSE, CAPILLARY
Glucose-Capillary: 125 mg/dL — ABNORMAL HIGH (ref 70–99)
Glucose-Capillary: 210 mg/dL — ABNORMAL HIGH (ref 70–99)

## 2014-02-06 MED ORDER — HEPARIN SOD (PORK) LOCK FLUSH 100 UNIT/ML IV SOLN
250.0000 [IU] | INTRAVENOUS | Status: AC | PRN
  Administered 2014-02-06: 250 [IU]

## 2014-02-06 MED ORDER — HEPARIN SOD (PORK) LOCK FLUSH 100 UNIT/ML IV SOLN
250.0000 [IU] | INTRAVENOUS | Status: DC | PRN
  Administered 2014-02-06: 250 [IU]

## 2014-02-06 MED ORDER — SACCHAROMYCES BOULARDII 250 MG PO CAPS: 250.0000 mg | ORAL_CAPSULE | Freq: Two times a day (BID) | ORAL | Status: DC

## 2014-02-06 MED ORDER — POLYETHYLENE GLYCOL 3350 17 G PO PACK: 17.0000 g | PACK | Freq: Every day | ORAL | Status: AC | PRN

## 2014-02-06 MED ORDER — HEPARIN SOD (PORK) LOCK FLUSH 100 UNIT/ML IV SOLN
250.0000 [IU] | Freq: Every day | INTRAVENOUS | Status: DC
Start: 2014-02-06 — End: 2014-02-06
  Filled 2014-02-06: qty 3

## 2014-02-06 NOTE — Progress Notes (Signed)
Gave report to [employer], Therapist, sports at [employer]	[place]. Left number if she had additional questions.

## 2014-02-06 NOTE — Progress Notes (Signed)
Physical Therapy Treatment Patient Details Name: [name] MRN: [id number] DOB: 01-13-1941 Today's Date: 02/06/2014    History of Present Illness Pt is a 73 yo female admitted with SOB and cough after being recently d/c'd from [facility] 5 days ago w respiratory issues.  Pt with h/o COPD and is on chronic O2.      PT Comments    Pt required increased time to arouse.  Assisted from  Supine to EOB + 2 assist.  Poor sitting tolerance EOB x 3 min with MAX c/o fatigue.  Assisted from bed to [facility] for BM.  Assisted with hygiene.  Amb limited distance using + 2 assist and EVA walker for increased support.  Positioned in recliner with multiple pillows.  Pt progressing slowly.   Follow Up Recommendations  LTACH (per chart review)     Equipment Recommendations       Recommendations for Other Services       Precautions / Restrictions Precautions Precautions: Fall Precaution Comments: monitor sats Restrictions Weight Bearing Restrictions: No    Mobility  Bed Mobility Overal bed mobility: Needs Assistance;+2 for physical assistance Bed Mobility: Supine to Sit     Supine to sit: HOB elevated;Mod assist     General bed mobility comments: Cues for sequencing and use of pad to bring pt around to EOB  Poor sitting balance with forward flex and max c/o fatigue.  Transfers Overall transfer level: Needs assistance Equipment used: Rolling walker (2 wheeled) Transfers: Sit to/from [facility] Sit to Stand: +2 physical assistance;Mod assist Stand pivot transfers: +2 physical assistance;Mod assist       General transfer comment: Assisted from elevated bed to [facility].  Sit to stand + 2 assist pt 25% but to pivot 1/4 turn pt 5%.  MAX c/o B LE weakness and fear of falling.   Ambulation/Gait Ambulation/Gait assistance: +2 physical assistance;+2 safety/equipment;Max assist Ambulation Distance (Feet): 8 Feet Assistive device:  (EVA walker) Gait Pattern/deviations: Step-to  pattern;Decreased step length - right;Decreased step length - left;Trunk flexed Gait velocity: decreased   General Gait Details: Used EVA walker for increased support and + 2 assist for safety.  Very limited activity tolerance and weak B LE's.  HIGH FALL RISK.   Stairs            Wheelchair Mobility    Modified Rankin (Stroke Patients Only)       Balance                                    Cognition                            Exercises      General Comments        Pertinent Vitals/Pain     Home Living                      Prior Function            PT Goals (current goals can now be found in the care plan section) Progress towards PT goals: Progressing toward goals    Frequency  Min 3X/week    PT Plan      Co-evaluation             End of Session Equipment Utilized During Treatment: Gait belt;Oxygen (4 lts) Activity Tolerance: Patient limited by fatigue  Patient left: in chair;with call bell/phone within reach     Time: 1145-1225 PT Time Calculation (min): 40 min  Charges:  $Gait Training: 8-22 mins $Therapeutic Activity: 23-37 mins                    G Codes:      Rica Koyanagi  PTA [facility]  Acute  Rehab Pager      [phone]

## 2014-02-06 NOTE — Progress Notes (Signed)
Clinical Social Work  Per [employer], patient to DC to [facility] today. CSW is signing off but available if further needs arise.  [place], [place] [phone]

## 2014-02-06 NOTE — Progress Notes (Signed)
Pt will discharge to [facility] today. They will call nurse with bed assignment and the accepting physician. Nurse to call report and then coordinate transportation via care-link once pt is ready for transfer.  Allene Dillon RN BSN  [phone]

## 2014-02-06 NOTE — Progress Notes (Signed)
Per review of chart, pt to dc to [facility] today.  MD please order swallow evaluation at [facility] if you find indicated. Luanna Salk, [facility] SLP [phone]

## 2014-02-06 NOTE — Progress Notes (Signed)
Name: [name] MRN: [id number] DOB: May 19, 1941    ADMISSION DATE:  01/25/2014 CONSULTATION DATE:  01/28/14  REFERRING MD :  Dr Jerilee Hoh PRIMARY SERVICE: [facility]>>>PCCM  CHIEF COMPLAINT:  Resp failure  BRIEF PATIENT DESCRIPTION:  73 yr old COPD (RA pt in office, recent admission requiring ETT)admitted SOB, now in distress , NIMV failed -ETT  SIGNIFICANT EVENTS: 4/9 - admission SOB 4/10 - declined requiring NIMV 4/11- severe dyschrony, paralyzed 4/15 - family discussion >> no CPR, no intubation, no defibrillation/cardioversion 4/17 - to floor bed  STUDIES: 4/9 CT chest - no PE, emphysema changes, stenosis of proximal Lt subclavian artery  LINES / TUBES: 4/11 ETT>>>4/15 4/11 [place]>>>  CULTURES: 4/8  BC>>>negative 4/12 sputum>>>abundant MSSA 4/13 Resp Virus panel >>negative  ANTIBIOTICS: Levofloxacin 4/9>>>4/14 vanc 4/13>>>4/14 Ancef 4/14>>>  SUBJECTIVE: Numerous family here. Pt spoke clearly until I complimented her on sounding better, then she began to whisper again.  VITAL SIGNS: Temp:  [97.6 F (36.4 C)-97.9 F (36.6 C)] 97.6 F (36.4 C) (04/20 0531) Pulse Rate:  [67-92] 67 (04/20 0531) Resp:  [16-18] 18 (04/20 0531) BP: (114-123)/(41-76) 115/76 mmHg (04/20 0531) SpO2:  [93 %-100 %] 99 % (04/20 0815) INTAKE / OUTPUT:  Intake/Output Summary (Last 24 hours) at 02/06/14 0913 Last data filed at 02/05/14 2033  Gross per 24 hour  Intake    640 ml  Output   1100 ml  Net   -460 ml    PHYSICAL EXAMINATION: General: no distress HEENT: no sinus tenderness. Voice weak/ hoarse, improved with distraction. No stridor Cardiovascular: regular, no murmur Lungs: exp wheeze, rhonchi that cl w/ cough  Abdomen:  Soft, non tender Musculoskeletal:  No edema.  Skin:  No rash Neuro: alert, pleasant, follows commands  LABS:  CBC  Recent Labs Lab 01/31/14 0552 02/01/14 0425 02/03/14 0400  HGB 10.9* 12.2 13.9  HCT 35.8* 39.5 44.7  WBC 9.9 12.6* 15.2*  PLT  153 174 192   CHEMISTRY  Recent Labs Lab 01/31/14 0552 02/01/14 0425 02/02/14 0530 02/03/14 0400 02/04/14 0740 02/06/14 0410  NA 146 147 151* 143 141 143  K 4.1 4.0 3.1* 3.6* 3.3* 3.6*  CL 106 102 97 95* 93* 98  CO2 34* 38* 41* 38* 39* 37*  GLUCOSE 133* 136* 117* 112* 113* 138*  BUN 30* 39* 45* 43* 34* 28*  CREATININE 0.63 0.71 0.68 0.70 0.81 0.67  CALCIUM 8.6 9.2 9.9 9.5 9.4 8.9  MG 2.5 2.8* 2.8*  --   --   --   PHOS 2.4 3.9 4.6  --   --   --    Estimated Creatinine Clearance: 55.2 ml/min (by C-G formula based on Cr of 0.67).   ENDOCRINE CBG (last 3)   Recent Labs  02/05/14 1614 02/05/14 2217 02/06/14 0744  GLUCAP 103* 187* 125*     IMAGING x48h  Dg Chest 2 View  02/04/2014   CLINICAL DATA:  COPD exacerbation  EXAM: CHEST  2 VIEW  COMPARISON:  February 01, 2014  FINDINGS: Endotracheal tube is been removed. Central catheter tip is in the superior vena cava. Nasogastric tube is been removed. No pneumothorax.  There is underlying emphysema. There is no edema or consolidation. The heart size is within normal limits. Pulmonary vascularity reflects underlying emphysema. No adenopathy. 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  IMPRESSION: Central catheter tip is in the superior cava. No pneumothorax. Underlying emphysema. No edema or consolidation.   Electronically Signed   By: Gwyndolyn Saxon  Jasmine December M.D.   On: 02/04/2014 17:21    ASSESSMENT / PLAN:   Acute on chronic respiratory failure due to AECOPD, MSSA purulent bronchitis, further complicated by element of volume overload.  Seems to be penetrating w/ clears and other consistencies  P:   Oxygen to keep SpO2 > 88% F/u CXR intermittently> 4/18 COPD/ NAD DNI Continue solumedrol 40 mg q12h for now Continue brovana, pulmicort, and spiriva with prn albuterol SLP eval and treat for possible aspiration- pending, this could be done at [facility] IF not done here  Day 7/8 ancef   Hx of CAD, systolic CHF. P:  Continue ASA,  plavix, lopressor Changed lasix to 40 mg po bid on 4/16 Keep CVL in for now  No acute issues. Leukocytosis on steroids P:  F/u CBC intermittently Lovenox for DVT prevention   Steroid induced hyperglycemia. P:   SSI   Benzo dep, PTSD from last ETT. Deconditioning. P:   Continue buspar, klonopin, neurontin Changed ativan to prn only 4/16 due to over sedation PT/OT, mobilize

## 2014-02-07 ENCOUNTER — Ambulatory Visit: Payer: Medicare Other | Admitting: Internal Medicine

## 2014-02-07 ENCOUNTER — Other Ambulatory Visit (HOSPITAL_COMMUNITY): Payer: Self-pay

## 2014-02-07 DIAGNOSIS — Z0289 Encounter for other administrative examinations: Secondary | ICD-10-CM

## 2014-02-07 LAB — CBC
HEMATOCRIT: 42.5 % (ref 36.0–46.0)
Hemoglobin: 13.4 g/dL (ref 12.0–15.0)
MCH: 27.3 pg (ref 26.0–34.0)
MCHC: 31.5 g/dL (ref 30.0–36.0)
MCV: 86.7 fL (ref 78.0–100.0)
Platelets: 191 10*3/uL (ref 150–400)
RBC: 4.9 MIL/uL (ref 3.87–5.11)
RDW: 14.7 % (ref 11.5–15.5)
WBC: 16.8 10*3/uL — AB (ref 4.0–10.5)

## 2014-02-07 LAB — HEPATIC FUNCTION PANEL
ALT: 8 U/L (ref 0–35)
AST: 15 U/L (ref 0–37)
Albumin: 3 g/dL — ABNORMAL LOW (ref 3.5–5.2)
Alkaline Phosphatase: 84 U/L (ref 39–117)
BILIRUBIN TOTAL: 0.3 mg/dL (ref 0.3–1.2)
Total Protein: 6.4 g/dL (ref 6.0–8.3)

## 2014-02-07 LAB — TSH: TSH: 1.09 u[IU]/mL (ref 0.350–4.500)

## 2014-02-07 NOTE — Consult Note (Signed)
Name: [name] MRN: [id number] DOB: Dec 03, 1940    ADMISSION DATE:  02/06/2014 CONSULTATION DATE:  4/21  REFERRING MD :  Laren Everts  PRIMARY SERVICE:  Hijazi   CHIEF COMPLAINT:  COPD  BRIEF PATIENT DESCRIPTION:   This is a 73 year old female w/ chronic resp failure transferred to [facility] s/p hospitalization at [facility] from 4/8 to 4/20 for acute on chronic resp failure requiring mechanical ventilation in due to AECOPD in setting of MSSA purulent bronchitis +/- element of volume excess. PCCM asked to see for ongoing pulm support.     LINES / TUBES:  4/11 ETT>>>4/15  4/11 [place]>>>   CULTURES:  4/8 BC>>>negative  4/12 sputum>>>abundant MSSA  4/13 Resp Virus panel >>negative   ANTIBIOTICS:  Levofloxacin 4/9>>>4/14  vanc 4/13>>>4/14 Ancef 4/14>>>  CODE STATUS She is OK with short term BIPAP, pressors, IVFs. No CPR, defib, intubation or cardioversion.   HPI   73 yo female with h/o chronic respiratory failure in the setting of copd on (chronic oxygen at home), just d/c from rehab on April 5 after a hospitalization that occurred in January, requiring intubation. Presented on 4/9 w/ several days of worsening sob and cough. Pt was weaned off of her steroids, was on 78m pred daily up until 4/5. Denied fevers. No n/v/d. No swelling. Exsmoker. She called her daughter b/c she was sob, dtr is RN in ED and noted her moms significant sob over the phone, so called 911. Pt has received iv solumedrol and hour long neb in ED and feels better but still sob. She just got home from rehab about 5 days ago. She has been coughing a lot, was on mucinex at the rehab center but this was stopped last week and her secretions have worsened since. No swelling in her legs. Was admitted to the SDU. Placed O2, nebs, steroids, abx. CT chest was negative for PE. She declined in spite of these measures requiring NIPPV on 4/10 and intubation on 4/11. She had significant trouble with vent synchrony, initially requiring  neuro-muscular blockade in effort to safely ventilate her. She made slow improvement over the following days. We resumed diuresis on 4/14 which seemed to have significant improvement and we were able to begin pressure support trials on 4/15. She looked much stronger and passed her Spontaneous breathing trial on 4/16, she was extubated that day. Following extubation the patient asked for a goals of care meeting. Her daughters were at bedside. She clearly indicated that she did not want to be intubated again in the future. She requested DNI status as well as DNR status. She did instruct uKoreathat she does desire aggressive care up until measures fail. She would accept NIPPV, IVFs, antibiotics, aggressive treatment, however does not want to Go on vent and requests DNR status. Family was in agreement. The following days were spent working of strength and rehabilitation. She initially had some difficulty with swallowing but per bedside eval has improved at time of d/c. She remains very debilitated. Possibly due to combination of neuromuscular blockade and systemic steroids. She is in agreement with and from a pulmonary stand-point is appropriate [place]     PAST MEDICAL HISTORY :  Past Medical History  Diagnosis Date  . Other emphysema   . Unspecified arthropathy, shoulder region   . Gastric ulcer, unspecified as acute or chronic, without mention of hemorrhage, perforation, or obstruction   . Benign paroxysmal positional vertigo   . COPD (chronic obstructive pulmonary  disease)   . Diverticula of colon   . Osteopenia   . Personal history of colonic polyps 2007    tubular adenoma high grade dysplasia  . Diverticulitis   . Hyperlipemia   . Fracture of ankle, bimalleolar, right, closed   . Hypoxemia   . C. difficile diarrhea   . Varicose vein   . Asthma   . GERD (gastroesophageal reflux disease)   . Hepatitis 1989  . Arthritis     "both shoulders"   . TIA  (transient ischemic attack) 04/2008; 08/21/2012    "slight droop left lower lip after 08/21/12 TIA"  . On home oxygen therapy   . Ischemic cardiomyopathy     a. EF 45-50% 2011. b. EF 35-40% by cath 09/01/12.  . Night terrors, adult     dr Leonie Man referred  for PSG in 2011, and again in 2013   . Breast cancer 04/08/13 bx    left  . Allergy   . CAD (coronary artery disease)     a. ant MI 68 tx with TPA/PTCA. b. Prior stenting procedures including RCA stent, DES to LCx 04/2011. c. [place] -> DES  to prox LAD 08/2012.   Marland Kitchen Hypertension     TAKES FOR H/O STROKE  . CVA (cerebral vascular accident)   . Stroke, acute, embolic     Nov 1962 , [place]  [facility] , was treated with TPA.   . 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 Path G1 IDC, 1.6 cm, neg margin, neg SLN    Past Surgical History  Procedure Laterality Date  . Total shoulder arthroplasty  2012    left  . Orif ankle fracture  2007    right  . Martial megeti  2297'L    "bladder operation; not successful " (08/31/2012)  . Varicose vein surgery  1983; 1985    "both legs both times" (08/31/2012)  . Total shoulder replacement  12/05/2010    Dr Onnie Graham  . Abdominal hysterectomy      TAH w/BSO  . Augmentation mammaplasty  1975  . Coronary angioplasty with stent placement  2010  . Coronary angioplasty  1990    "3 times" (08/31/2012)  . Colon surgery  07/07/2011    Dr. Hassell Done  . Appendectomy  1960  . Nose surgery      1977  . Breast augmenttation    . Catarct  right      RIGHT EYE  . Breast lumpectomy with needle localization and axillary sentinel lymph node bx Left 06/02/2013    Procedure: BREAST LUMPECTOMY WITH NEEDLE LOCALIZATION AND AXILLARY SENTINEL LYMPH NODE BX;  Surgeon: Haywood Lasso, MD;  Location: [facility];  Service: General;  Laterality: Left;  NEEDLE LOC BCG 7:30 NUC MED 9:30 RECONSTRUCTION TO FOLLOW 1  HOUR added   . Breast implant removal Left 06/02/2013    Procedure: REMOVAL BREAST IMPLANTS; LEFT BREAST CAPSULECTOMY WITH PLACEMENT OF TISSUE EXPANDER AND USE OF FLEX HD;  Surgeon: Crissie Reese, MD;  Location: [place] [place];  Service: Plastics;  Laterality: Left;   Prior to Admission medications   Medication Sig Start Date End Date Taking? Authorizing Provider  acetaminophen (TYLENOL) 325 MG tablet Take 2 tablets (650 mg total) by mouth every 6 (six) hours as needed for mild pain, fever or headache. 02/03/14   Erick Colace, NP  albuterol (PROVENTIL) (2.5 MG/3ML) 0.083%  nebulizer solution Take 2.5 mg by nebulization every 6 (six) hours as needed for wheezing or shortness of breath.    Historical Provider, MD  albuterol (PROVENTIL) (2.5 MG/3ML) 0.083% nebulizer solution Take 3 mLs (2.5 mg total) by nebulization every 2 (two) hours as needed for wheezing or shortness of breath. 02/03/14   Erick Colace, NP  anastrozole (ARIMIDEX) 1 MG tablet Take 1 mg by mouth daily.    Historical Provider, MD  antiseptic oral rinse (BIOTENE) LIQD 15 mLs by Mouth Rinse route 2 times daily at 12 noon and 4 pm. 02/03/14   Erick Colace, NP  arformoterol (BROVANA) 15 MCG/2ML NEBU Take 2 mLs (15 mcg total) by nebulization 2 (two) times daily. 02/03/14   Erick Colace, NP  aspirin EC 81 MG tablet Take 81 mg by mouth daily.    Historical Provider, MD  B Complex Vitamins (B-COMPLEX/B-12 PO) Take 1 tablet by mouth daily.    Historical Provider, MD  budesonide (PULMICORT) 0.5 MG/2ML nebulizer solution Take 2 mLs (0.5 mg total) by nebulization 2 (two) times daily. 02/03/14   Erick Colace, NP  busPIRone (BUSPAR) 15 MG tablet Take 15 mg by mouth 2 (two) times daily.    Historical Provider, MD  Calcium Carb-Cholecalciferol (CALCIUM 600 + D PO) Take 0.5 tablets by mouth 2 (two) times daily.    Historical Provider, MD  ceFAZolin (ANCEF) 1-5 GM-% Inject 50 mLs (1 g total) into the vein every 8 (eight) hours. 02/03/14   Erick Colace,  NP  Cholecalciferol (VITAMIN D) 2000 UNITS CAPS Take 1 capsule by mouth 2 (two) times daily.    Historical Provider, MD  clonazePAM (KLONOPIN) 1 MG tablet Take 1 tablet (1 mg total) by mouth every 12 (twelve) hours. 02/03/14   Erick Colace, NP  clopidogrel (PLAVIX) 75 MG tablet Take 1 tablet (75 mg total) by mouth daily. 01/20/14   Burnell Blanks, MD  CRANBERRY PO Take 1 tablet by mouth daily.    Historical Provider, MD  docusate sodium 100 MG CAPS Take 100 mg by mouth daily. 02/03/14   Erick Colace, NP  enoxaparin (LOVENOX) 40 MG/0.4ML injection Inject 0.4 mLs (40 mg total) into the skin daily. 02/03/14   Erick Colace, NP  feeding supplement, ENSURE COMPLETE, (ENSURE COMPLETE) LIQD Take 237 mLs by mouth 2 (two) times daily between meals. 02/03/14   Erick Colace, NP  furosemide (LASIX) 40 MG tablet Take 1 tablet (40 mg total) by mouth 2 (two) times daily. 02/03/14   Erick Colace, NP  gabapentin (NEURONTIN) 300 MG capsule Take 300 mg by mouth at bedtime.    Historical Provider, MD  HYDROcodone-homatropine (HYCODAN) 5-1.5 MG/5ML syrup Take 5 mLs by mouth 2 (two) times daily as needed for cough. 01/25/14   Rigoberto Noel, MD  insulin aspart (NOVOLOG) 100 UNIT/ML injection Inject 0-20 Units into the skin 3 (three) times daily with meals. 02/03/14   Erick Colace, NP  lidocaine (LIDODERM) 5 % Place 1 patch onto the skin at bedtime. Remove & Discard patch within 12 hours or as directed by MD 02/03/14   Erick Colace, NP  LORazepam (ATIVAN) 2 MG/ML injection Inject 0.25 mLs (0.5 mg total) into the vein every 4 (four) hours as needed for anxiety. 02/03/14   Erick Colace, NP  methylPREDNISolone sodium succinate (SOLU-MEDROL) 40 mg/mL injection Inject 1 mL (40 mg total) into the vein every 12 (twelve) hours. 02/03/14   Erick Colace,  NP  metoprolol tartrate (LOPRESSOR) 25 MG tablet Take 12.5 mg by mouth daily.    Historical Provider, MD  Multiple Vitamin (MULTIVITAMIN WITH MINERALS) TABS  tablet Take 0.5 tablets by mouth 2 (two) times daily.    Historical Provider, MD  nystatin (MYCOSTATIN) 100000 UNIT/ML suspension Take 5 mLs (500,000 Units total) by mouth 2 (two) times daily. 01/23/14   Rigoberto Noel, MD  Omega-3 Fatty Acids (FISH OIL) 1200 MG CAPS Take 1 capsule by mouth daily.    Historical Provider, MD  oxybutynin (DITROPAN) 5 MG tablet Take 5 mg by mouth at bedtime.     Historical Provider, MD  pantoprazole (PROTONIX) 40 MG tablet Take 1 tablet (40 mg total) by mouth 2 (two) times daily. 12/23/13   Burnell Blanks, MD  polyethylene glycol (MIRALAX / Floria Raveling) packet Take 17 g by mouth daily as needed. 02/06/14   Erick Colace, NP  potassium chloride SA (K-DUR,KLOR-CON) 20 MEQ tablet Take 20 mEq by mouth daily.    Historical Provider, MD  Pyridoxine HCl (VITAMIN B-6 PO) Take 1 tablet by mouth daily.    Historical Provider, MD  saccharomyces boulardii (FLORASTOR) 250 MG capsule Take 1 capsule (250 mg total) by mouth 2 (two) times daily. 02/06/14   Erick Colace, NP  simvastatin (ZOCOR) 40 MG tablet Take 40 mg by mouth daily.    Historical Provider, MD  tiotropium (SPIRIVA) 18 MCG inhalation capsule Place 18 mcg into inhaler and inhale daily.    Historical Provider, MD  traMADol (ULTRAM) 50 MG tablet Take 75 mg by mouth every 6 (six) hours as needed for moderate pain.    Historical Provider, MD   Allergies  Allergen Reactions  . Escitalopram Oxalate Shortness Of Breath, Diarrhea and Nausea And Vomiting    Hot flashes, dizziness  . Indomethacin Other (See Comments)    "made me very very dizzy and made me have vertigo" (08/31/2012)  . Nsaids     Jerking, hot flashes, nausea  . Codeine Nausea And Vomiting    "I can take codeine in my cough medicine" (08/31/2012)  . Sulfonamide Derivatives Other (See Comments)    Drug induced hepatitis in 1989    FAMILY HISTORY:  Family History  Problem Relation Age of Onset  . Breast cancer Sister     x 2  . Colon cancer Neg Hx     . Diabetes Paternal Grandmother   . Uterine cancer Paternal Grandmother   . Lung cancer Paternal Grandmother   . Heart disease Mother   . Alcohol abuse Mother   . Heart disease Father   . Pancreatic cancer Cousin   . Colon polyps Sister   . Diabetes Maternal Uncle   . Heart disease Maternal Grandfather   . Heart disease Paternal Grandfather   . Stroke Paternal Grandfather    SOCIAL HISTORY:  reports that she quit smoking about 16 years ago. Her smoking use included Cigarettes. She has a 40 pack-year smoking history. She has never used smokeless tobacco. She reports that she drinks about 1.8 ounces of alcohol per week. She reports that she does not use illicit drugs.  Review of Systems:   Bolds are positive  Constitutional: weight loss, gain, night sweats, Fevers, chills, fatigue .  HEENT: headaches, Sore throat, sneezing, nasal congestion, post nasal drip, Difficulty swallowing, Tooth/dental problems, visual complaints visual changes, ear ache CV:  chest pain, radiates: ,Orthopnea, PND, swelling in lower extremities, dizziness, palpitations, syncope.  GI  heartburn, indigestion, abdominal pain, nausea,  vomiting, diarrhea, change in bowel habits, loss of appetite, bloody stools.  Resp: cough, still present after intake of POs, not productive: , hemoptysis, dyspnea, associated w/ coughing episodes after eating, often improved w/ pursed lip breathing, still has DOE, chest pain, pleuritic.  Skin: rash or itching or icterus GU: dysuria, change in color of urine, urgency or frequency. flank pain, hematuria  MS: joint pain or swelling. decreased range of motion, strength remains weak in UEs, but improving daily.  Psych: change in mood or affect. depression or anxiety.  Neuro: difficulty with speech, weakness, numbness, ataxia   SUBJECTIVE:  No distress  VITAL SIGNS: Temp:  [98.2 F (36.8 C)] 98.2 F (36.8 C) (04/20 1348) Pulse Rate:  [82] 82 (04/20 1348) Resp:  [18] 18 (04/20  1348) BP: (150)/(84) 150/84 mmHg (04/20 1348) SpO2:  [98 %] 98 % (04/20 1718) 3 liters  PHYSICAL EXAMINATION: General:  No acute distress.  Neuro:  Awake, alert w/ no focal def.  HEENT:  [place], voice quality cont to improve  Cardiovascular:  rrr Lungs:  Exp wheeze post bases  Abdomen:  Soft, non-tender + bowel sounds  Musculoskeletal:  Intact  Skin:  Intact    Recent Labs Lab 02/03/14 0400 02/04/14 0740 02/06/14 0410  NA 143 141 143  K 3.6* 3.3* 3.6*  CL 95* 93* 98  CO2 38* 39* 37*  BUN 43* 34* 28*  CREATININE 0.70 0.81 0.67  GLUCOSE 112* 113* 138*    Recent Labs Lab 02/01/14 0425 02/03/14 0400 02/07/14 0500  HGB 12.2 13.9 13.4  HCT 39.5 44.7 42.5  WBC 12.6* 15.2* 16.8*  PLT 174 192 191   Dg Chest Port 1 View  02/07/2014   CLINICAL DATA:  Respiratory failure  EXAM: PORTABLE CHEST - 1 VIEW  COMPARISON:  02/04/2014  FINDINGS: Right arm PICC tip in the SVC. The lungs are clear. Negative for infiltrate effusion or heart failure.  IMPRESSION: No active disease.   Electronically Signed   By: Franchot Gallo M.D.   On: 02/07/2014 07:58    ASSESSMENT / PLAN:  Acute on chronic respiratory failure due to AECOPD, MSSA purulent bronchitis, further complicated by element of volume overload.  Probable aspiration/Dysphagia  Plan:  Oxygen to keep SpO2 > 88%  F/u CXR intermittently  DNI  Continue solumedrol 40 mg q12h for now, with plan to taper back to off  Continue brovana (performist), pulmicort, and spiriva with prn albuterol  Complete 8 days ancef. As of 4/20 day 8/8  F/u MBS ordered for 4/21. Will likely need diet modifications   Hx of CAD, systolic CHF.  ECHO 11/12/13: Overall LVEF appears to be mild to moderately depressed. There is akinesis of the apex  Plan:  Continue ASA, plavix, lopressor and lasix  Keep CVL in for now   Benzo dep, PTSD from last ETT.  Deconditioning...  Muscular weakness.  > had been on steroids and neuromuscular blockade.  Plan:  Continue  buspar, klonopin, neurontin  Changed ativan to prn only 4/16 due to over sedation  Needs extensive PT and OT    GOALS of CARE  Spoke at length with the patient. Her daughter Caryl Pina, as well as her sisters were all present. The patient has requested that we do not use the ventilator again. She and her family have requested limited code status. We will continue aggressive non-invasive medical care with limited code status.  She is OK with short term BIPAP, pressors, IVFs. No CPR, defib, intubation or cardioversion.   She  is a pt of [facility] Dr [name]. We will see weekly while in [facility].    Marni Griffon ACNP-BC [place] Pager # [phone] OR # [phone] if no answer  Will order swallow evaluation, DNI, continue respiratory medication as above.  Will follow with you on a weekly bases.  Patient seen and examined, agree with above note.  I dictated the care and orders written for this patient under my direction.  Rush Farmer, MD [phone]

## 2014-02-08 LAB — CBC WITH DIFFERENTIAL/PLATELET
BASOS ABS: 0 10*3/uL (ref 0.0–0.1)
Basophils Relative: 0 % (ref 0–1)
EOS ABS: 0 10*3/uL (ref 0.0–0.7)
EOS PCT: 0 % (ref 0–5)
HCT: 41.3 % (ref 36.0–46.0)
Hemoglobin: 13.1 g/dL (ref 12.0–15.0)
Lymphocytes Relative: 5 % — ABNORMAL LOW (ref 12–46)
Lymphs Abs: 0.8 10*3/uL (ref 0.7–4.0)
MCH: 27.5 pg (ref 26.0–34.0)
MCHC: 31.7 g/dL (ref 30.0–36.0)
MCV: 86.8 fL (ref 78.0–100.0)
Monocytes Absolute: 0.4 10*3/uL (ref 0.1–1.0)
Monocytes Relative: 3 % (ref 3–12)
Neutro Abs: 16.3 10*3/uL — ABNORMAL HIGH (ref 1.7–7.7)
Neutrophils Relative %: 92 % — ABNORMAL HIGH (ref 43–77)
PLATELETS: 185 10*3/uL (ref 150–400)
RBC: 4.76 MIL/uL (ref 3.87–5.11)
RDW: 14.7 % (ref 11.5–15.5)
WBC: 17.6 10*3/uL — AB (ref 4.0–10.5)

## 2014-02-08 LAB — BASIC METABOLIC PANEL
BUN: 29 mg/dL — ABNORMAL HIGH (ref 6–23)
CALCIUM: 9.4 mg/dL (ref 8.4–10.5)
CO2: 34 mEq/L — ABNORMAL HIGH (ref 19–32)
Chloride: 93 mEq/L — ABNORMAL LOW (ref 96–112)
Creatinine, Ser: 0.74 mg/dL (ref 0.50–1.10)
GFR calc Af Amer: >90 mL/min (ref >90)
GFR, EST NON AFRICAN AMERICAN: 83 mL/min — AB (ref >90)
Glucose, Bld: 177 mg/dL — ABNORMAL HIGH (ref 70–99)
Potassium: 4.1 mEq/L (ref 3.7–5.3)
Sodium: 138 mEq/L (ref 137–147)

## 2014-02-09 LAB — CBC
HCT: 39.9 % (ref 36.0–46.0)
Hemoglobin: 12.6 g/dL (ref 12.0–15.0)
MCH: 27.5 pg (ref 26.0–34.0)
MCHC: 31.6 g/dL (ref 30.0–36.0)
MCV: 87.1 fL (ref 78.0–100.0)
PLATELETS: 191 10*3/uL (ref 150–400)
RBC: 4.58 MIL/uL (ref 3.87–5.11)
RDW: 14.9 % (ref 11.5–15.5)
WBC: 17.9 10*3/uL — AB (ref 4.0–10.5)

## 2014-02-09 LAB — PROCALCITONIN: Procalcitonin: <0.1 ng/mL

## 2014-02-09 NOTE — Telephone Encounter (Signed)
Closing encounter

## 2014-02-10 LAB — BASIC METABOLIC PANEL
BUN: 36 mg/dL — ABNORMAL HIGH (ref 6–23)
CO2: 32 meq/L (ref 19–32)
Calcium: 9.2 mg/dL (ref 8.4–10.5)
Chloride: 92 mEq/L — ABNORMAL LOW (ref 96–112)
Creatinine, Ser: 0.7 mg/dL (ref 0.50–1.10)
GFR calc Af Amer: >90 mL/min (ref >90)
GFR calc non Af Amer: 85 mL/min — ABNORMAL LOW (ref >90)
GLUCOSE: 178 mg/dL — AB (ref 70–99)
POTASSIUM: 4.6 meq/L (ref 3.7–5.3)
Sodium: 138 mEq/L (ref 137–147)

## 2014-02-10 LAB — CBC
HCT: 41.7 % (ref 36.0–46.0)
HEMOGLOBIN: 13.2 g/dL (ref 12.0–15.0)
MCH: 27.2 pg (ref 26.0–34.0)
MCHC: 31.7 g/dL (ref 30.0–36.0)
MCV: 86 fL (ref 78.0–100.0)
PLATELETS: 192 10*3/uL (ref 150–400)
RBC: 4.85 MIL/uL (ref 3.87–5.11)
RDW: 14.9 % (ref 11.5–15.5)
WBC: 15.2 10*3/uL — ABNORMAL HIGH (ref 4.0–10.5)

## 2014-02-11 LAB — BASIC METABOLIC PANEL
BUN: 37 mg/dL — AB (ref 6–23)
CALCIUM: 9.4 mg/dL (ref 8.4–10.5)
CO2: 34 meq/L — AB (ref 19–32)
CREATININE: 0.72 mg/dL (ref 0.50–1.10)
Chloride: 93 mEq/L — ABNORMAL LOW (ref 96–112)
GFR calc Af Amer: >90 mL/min (ref >90)
GFR, EST NON AFRICAN AMERICAN: 84 mL/min — AB (ref >90)
GLUCOSE: 106 mg/dL — AB (ref 70–99)
Potassium: 4.5 mEq/L (ref 3.7–5.3)
Sodium: 140 mEq/L (ref 137–147)

## 2014-02-11 LAB — CBC
HEMATOCRIT: 40.3 % (ref 36.0–46.0)
HEMOGLOBIN: 12.9 g/dL (ref 12.0–15.0)
MCH: 27.4 pg (ref 26.0–34.0)
MCHC: 32 g/dL (ref 30.0–36.0)
MCV: 85.7 fL (ref 78.0–100.0)
Platelets: 194 10*3/uL (ref 150–400)
RBC: 4.7 MIL/uL (ref 3.87–5.11)
RDW: 15.2 % (ref 11.5–15.5)
WBC: 17.3 10*3/uL — ABNORMAL HIGH (ref 4.0–10.5)

## 2014-02-13 ENCOUNTER — Other Ambulatory Visit (HOSPITAL_COMMUNITY): Payer: Self-pay

## 2014-02-13 LAB — BASIC METABOLIC PANEL
BUN: 35 mg/dL — AB (ref 6–23)
CALCIUM: 8.6 mg/dL (ref 8.4–10.5)
CO2: 32 mEq/L (ref 19–32)
Chloride: 93 mEq/L — ABNORMAL LOW (ref 96–112)
Creatinine, Ser: 0.66 mg/dL (ref 0.50–1.10)
GFR calc Af Amer: >90 mL/min (ref >90)
GFR calc non Af Amer: 86 mL/min — ABNORMAL LOW (ref >90)
GLUCOSE: 186 mg/dL — AB (ref 70–99)
POTASSIUM: 4.9 meq/L (ref 3.7–5.3)
SODIUM: 139 meq/L (ref 137–147)

## 2014-02-13 LAB — CBC WITH DIFFERENTIAL/PLATELET
BASOS PCT: 0 % (ref 0–1)
Basophils Absolute: 0 10*3/uL (ref 0.0–0.1)
Eosinophils Absolute: 0 10*3/uL (ref 0.0–0.7)
Eosinophils Relative: 0 % (ref 0–5)
HCT: 43.6 % (ref 36.0–46.0)
Hemoglobin: 13.7 g/dL (ref 12.0–15.0)
LYMPHS ABS: 0.9 10*3/uL (ref 0.7–4.0)
Lymphocytes Relative: 6 % — ABNORMAL LOW (ref 12–46)
MCH: 27.2 pg (ref 26.0–34.0)
MCHC: 31.4 g/dL (ref 30.0–36.0)
MCV: 86.7 fL (ref 78.0–100.0)
Monocytes Absolute: 0.4 10*3/uL (ref 0.1–1.0)
Monocytes Relative: 3 % (ref 3–12)
NEUTROS PCT: 91 % — AB (ref 43–77)
Neutro Abs: 12.8 10*3/uL — ABNORMAL HIGH (ref 1.7–7.7)
PLATELETS: 197 10*3/uL (ref 150–400)
RBC: 5.03 MIL/uL (ref 3.87–5.11)
RDW: 15.3 % (ref 11.5–15.5)
WBC: 14 10*3/uL — ABNORMAL HIGH (ref 4.0–10.5)

## 2014-02-13 LAB — MAGNESIUM: Magnesium: 2.1 mg/dL (ref 1.5–2.5)

## 2014-02-13 LAB — PHOSPHORUS: PHOSPHORUS: 3.5 mg/dL (ref 2.3–4.6)

## 2014-02-13 NOTE — Progress Notes (Signed)
Name: EVELINE SAUVE MRN: [id number] DOB: [birth year]    ADMISSION DATE:  02/06/2014 CONSULTATION DATE:  4/21  REFERRING MD :  Laren Everts  PRIMARY SERVICE:  Hijazi   CHIEF COMPLAINT:  COPD  BRIEF PATIENT DESCRIPTION:   This is a 73 year old female w/ chronic resp failure transferred to [facility] s/p hospitalization at [facility] from 4/8 to 4/20 for acute on chronic resp failure requiring mechanical ventilation in due to AECOPD in setting of MSSA purulent bronchitis +/- element of volume excess. PCCM asked to see for ongoing pulm support.     LINES / TUBES:  4/11 ETT>>>4/15  4/11 [place]>>>   CULTURES:  4/8 BC>>>negative  4/12 sputum>>>abundant MSSA  4/13 Resp Virus panel >>negative   ANTIBIOTICS:  Levofloxacin 4/9>>>4/14  vanc 4/13>>>4/14 Ancef 4/14>>>  CODE STATUS She is OK with short term BIPAP, pressors, IVFs. No CPR, defib, intubation or cardioversion.   SUBJECTIVE:  No distress  VITAL SIGNS:   3 liters  PHYSICAL EXAMINATION: General:  No acute distress.  Neuro:  Awake, alert w/ no focal def.  HEENT:  [place], voice quality cont to improve  Cardiovascular:  rrr Lungs:  Exp wheeze post bases  Abdomen:  Soft, non-tender + bowel sounds  Musculoskeletal:  Intact  Skin:  Intact    Recent Labs Lab 02/08/14 1210 02/10/14 0639 02/11/14 0600  NA 138 138 140  K 4.1 4.6 4.5  CL 93* 92* 93*  CO2 34* 32 34*  BUN 29* 36* 37*  CREATININE 0.74 0.70 0.72  GLUCOSE 177* 178* 106*    Recent Labs Lab 02/09/14 0500 02/10/14 0639 02/11/14 0600  HGB 12.6 13.2 12.9  HCT 39.9 41.7 40.3  WBC 17.9* 15.2* 17.3*  PLT 191 192 194   Dg Chest Port 1 View  02/13/2014   CLINICAL DATA:  Pneumonia/bronchitis.  EXAM: PORTABLE CHEST - 1 VIEW  COMPARISON:  DG CHEST 1V PORT dated 02/07/2014  FINDINGS: Cardiac silhouette is unremarkable and unchanged. Mildly calcified aortic knob. Diffuse mild interstitial prominence, slightly increased without pleural effusions or focal consolidations. Strandy densities  at left costophrenic angle. No pneumothorax.  Right subclavian central venous catheter with distal tip projecting in mid superior vena cava. No pneumothorax. Status post left humeral arthroplasty.  IMPRESSION: Mildly increasing interstitial prominence could reflect bronchiolitis without focal consolidation. Strandy densities in left lower costophrenic angle favor atelectasis.   Electronically Signed   By: Elon Alas   On: 02/13/2014 06:41  agree, slight increase in bilateral interstitial infiltrates. Favor mix of atx and edema  ASSESSMENT / PLAN:  Acute on chronic respiratory failure due to AECOPD, MSSA purulent bronchitis, further complicated by element of volume overload.  aspiration/Dysphagia  Hx of CAD, systolic CHF: ECHO 3/41/96: Overall LVEF appears to be mild to moderately depressed. There is akinesis of the apex  Benzo dep, PTSD from last ETT.  Deconditioning...  Muscular weakness.> had been on steroids and neuromuscular blockade.  Discussion Looking about the same. Slowly progressing  Plan:  Oxygen to keep SpO2 > 88%  F/u CXR intermittently  Cont dysphagia precautions DNI  Taper steroids, would transition to pred , she has no wheezing, moves air well Continue brovana (performist), pulmicort, and spiriva with prn albuterol  Continue ASA, plavix, lopressor and lasix  Continue buspar, klonopin, neurontin, PRN ativan Needs extensive PT and OT  She is OK with short term BIPAP, pressors, IVFs. No CPR, defib, intubation or cardioversion. See initial consult pT active  She is a pt of [facility]  Dr [name]. We will see weekly while in [facility].   Erick Colace, NP [phone]  I have fully examined this patient and agree with above findings.    And edited infull  Lavon Paganini. Titus Mould, MD, [place] Pgr: [facility]

## 2014-02-14 ENCOUNTER — Other Ambulatory Visit (HOSPITAL_COMMUNITY): Payer: Medicare Other

## 2014-02-14 LAB — PREALBUMIN: PREALBUMIN: 36.1 mg/dL — AB (ref 17.0–34.0)

## 2014-02-15 LAB — CBC WITH DIFFERENTIAL/PLATELET
Basophils Absolute: 0 10*3/uL (ref 0.0–0.1)
Basophils Relative: 0 % (ref 0–1)
EOS ABS: 0 10*3/uL (ref 0.0–0.7)
Eosinophils Relative: 0 % (ref 0–5)
HEMATOCRIT: 39.5 % (ref 36.0–46.0)
HEMOGLOBIN: 12.4 g/dL (ref 12.0–15.0)
Lymphocytes Relative: 10 % — ABNORMAL LOW (ref 12–46)
Lymphs Abs: 1.3 10*3/uL (ref 0.7–4.0)
MCH: 27.1 pg (ref 26.0–34.0)
MCHC: 31.4 g/dL (ref 30.0–36.0)
MCV: 86.4 fL (ref 78.0–100.0)
MONO ABS: 0.4 10*3/uL (ref 0.1–1.0)
MONOS PCT: 3 % (ref 3–12)
Neutro Abs: 11.6 10*3/uL — ABNORMAL HIGH (ref 1.7–7.7)
Neutrophils Relative %: 87 % — ABNORMAL HIGH (ref 43–77)
Platelets: 188 10*3/uL (ref 150–400)
RBC: 4.57 MIL/uL (ref 3.87–5.11)
RDW: 15.4 % (ref 11.5–15.5)
WBC: 13.3 10*3/uL — ABNORMAL HIGH (ref 4.0–10.5)

## 2014-02-15 LAB — BASIC METABOLIC PANEL
BUN: 34 mg/dL — AB (ref 6–23)
CALCIUM: 9 mg/dL (ref 8.4–10.5)
CO2: 37 mEq/L — ABNORMAL HIGH (ref 19–32)
CREATININE: 0.63 mg/dL (ref 0.50–1.10)
Chloride: 99 mEq/L (ref 96–112)
GFR calc Af Amer: >90 mL/min (ref >90)
GFR, EST NON AFRICAN AMERICAN: 87 mL/min — AB (ref >90)
Glucose, Bld: 143 mg/dL — ABNORMAL HIGH (ref 70–99)
Potassium: 4.4 mEq/L (ref 3.7–5.3)
Sodium: 145 mEq/L (ref 137–147)

## 2014-02-18 LAB — BASIC METABOLIC PANEL
BUN: 33 mg/dL — AB (ref 6–23)
CO2: 36 mEq/L — ABNORMAL HIGH (ref 19–32)
Calcium: 9.3 mg/dL (ref 8.4–10.5)
Chloride: 95 mEq/L — ABNORMAL LOW (ref 96–112)
Creatinine, Ser: 0.69 mg/dL (ref 0.50–1.10)
GFR, EST NON AFRICAN AMERICAN: 85 mL/min — AB (ref >90)
Glucose, Bld: 95 mg/dL (ref 70–99)
POTASSIUM: 3.8 meq/L (ref 3.7–5.3)
SODIUM: 144 meq/L (ref 137–147)

## 2014-02-18 LAB — CBC
HEMATOCRIT: 40.9 % (ref 36.0–46.0)
Hemoglobin: 12.8 g/dL (ref 12.0–15.0)
MCH: 27.1 pg (ref 26.0–34.0)
MCHC: 31.3 g/dL (ref 30.0–36.0)
MCV: 86.7 fL (ref 78.0–100.0)
PLATELETS: 185 10*3/uL (ref 150–400)
RBC: 4.72 MIL/uL (ref 3.87–5.11)
RDW: 15.6 % — AB (ref 11.5–15.5)
WBC: 14.4 10*3/uL — AB (ref 4.0–10.5)

## 2014-02-20 LAB — COMPREHENSIVE METABOLIC PANEL
ALT: 39 U/L — AB (ref 0–35)
AST: 28 U/L (ref 0–37)
Albumin: 2.8 g/dL — ABNORMAL LOW (ref 3.5–5.2)
Alkaline Phosphatase: 77 U/L (ref 39–117)
BUN: 29 mg/dL — ABNORMAL HIGH (ref 6–23)
CHLORIDE: 93 meq/L — AB (ref 96–112)
CO2: 38 mEq/L — ABNORMAL HIGH (ref 19–32)
Calcium: 9.3 mg/dL (ref 8.4–10.5)
Creatinine, Ser: 0.65 mg/dL (ref 0.50–1.10)
GFR calc non Af Amer: 87 mL/min — ABNORMAL LOW (ref >90)
GLUCOSE: 120 mg/dL — AB (ref 70–99)
POTASSIUM: 3.8 meq/L (ref 3.7–5.3)
SODIUM: 141 meq/L (ref 137–147)
TOTAL PROTEIN: 5.8 g/dL — AB (ref 6.0–8.3)
Total Bilirubin: 0.3 mg/dL (ref 0.3–1.2)

## 2014-02-20 LAB — CBC
HCT: 40.7 % (ref 36.0–46.0)
HEMOGLOBIN: 12.6 g/dL (ref 12.0–15.0)
MCH: 26.8 pg (ref 26.0–34.0)
MCHC: 31 g/dL (ref 30.0–36.0)
MCV: 86.6 fL (ref 78.0–100.0)
Platelets: 165 10*3/uL (ref 150–400)
RBC: 4.7 MIL/uL (ref 3.87–5.11)
RDW: 15.8 % — ABNORMAL HIGH (ref 11.5–15.5)
WBC: 12.2 10*3/uL — AB (ref 4.0–10.5)

## 2014-02-20 LAB — PREALBUMIN: Prealbumin: 31.2 mg/dL (ref 17.0–34.0)

## 2014-02-20 NOTE — Progress Notes (Signed)
   Name: [name] MRN: [id number] DOB: 02-19-1941    ADMISSION DATE:  02/06/2014 CONSULTATION DATE:  4/21  REFERRING MD :  Laren Everts  PRIMARY SERVICE:  Hijazi   CHIEF COMPLAINT:  COPD  BRIEF PATIENT DESCRIPTION:   This is a 73 year old female w/ chronic resp failure transferred to [facility] s/p hospitalization at [facility] from 4/8 to 4/20 for acute on chronic resp failure requiring mechanical ventilation in due to AECOPD in setting of MSSA purulent bronchitis +/- element of volume excess. PCCM asked to see for ongoing pulm support.     LINES / TUBES:  4/11 ETT>>>4/15  4/11 [place]>>>   CULTURES:  4/8 BC>>>negative  4/12 sputum>>>abundant MSSA  4/13 Resp Virus panel >>negative   ANTIBIOTICS:  Levofloxacin 4/9>>>4/14  vanc 4/13>>>4/14 Ancef 4/14>>>  CODE STATUS She is OK with short term BIPAP, pressors, IVFs. No CPR, defib, intubation or cardioversion.   SUBJECTIVE:  No distress  VITAL SIGNS:   3 liters  PHYSICAL EXAMINATION: General:  No acute distress.  Neuro:  Awake, alert w/ no focal def.  HEENT:  [place], voice quality cont to improve  Cardiovascular:  rrr Lungs:  Exp wheeze post bases  Abdomen:  Soft, non-tender + bowel sounds  Musculoskeletal:  Intact  Skin:  Intact    Recent Labs Lab 02/15/14 0600 02/18/14 0420 02/20/14 0500  NA 145 144 141  K 4.4 3.8 3.8  CL 99 95* 93*  CO2 37* 36* 38*  BUN 34* 33* 29*  CREATININE 0.63 0.69 0.65  GLUCOSE 143* 95 120*    Recent Labs Lab 02/15/14 0600 02/18/14 0420 02/20/14 0500  HGB 12.4 12.8 12.6  HCT 39.5 40.9 40.7  WBC 13.3* 14.4* 12.2*  PLT 188 185 165   No results found.  ASSESSMENT / PLAN:  Acute on chronic respiratory failure due to AECOPD, MSSA purulent bronchitis, further complicated by element of volume overload.  aspiration/Dysphagia  Hx of CAD, systolic CHF: ECHO 01/31/23: Overall LVEF appears to be mild to moderately depressed. There is akinesis of the apex  Benzo dep, PTSD from last ETT.    Deconditioning...  Muscular weakness.> had been on steroids and neuromuscular blockade.  Discussion She is looking stronger Has significant esophageal dysmotility. SLP thinks that this is a major contributing factor to her aspiration risk. Does best w/ D 1/thin liquids. She does not like eating pureed foods. We discussed quality she's not ready to switch to comfort feeds alone  Plan:  Oxygen to keep SpO2 > 88%  F/u CXR intermittently  Cont dysphagia precautions-->will need on-going SLP support DNI  Taper steroids as tolerated  Continue brovana (performist), pulmicort, and spiriva with prn albuterol  Continue ASA, plavix, lopressor and lasix  Continue buspar, klonopin, neurontin, PRN ativan Needs extensive PT and OT  She is OK with short term BIPAP, pressors, IVFs. No CPR, defib, intubation or cardioversion. See initial consult pT active  She is a pt of [facility]./ Dr [name]. We will see weekly while in [facility].   Erick Colace, NP  Patient seen and examined, agree with above note.  I dictated the care and orders written for this patient under my direction.  Rush Farmer, MD [phone]

## 2014-02-22 LAB — BASIC METABOLIC PANEL
BUN: 28 mg/dL — AB (ref 6–23)
CO2: 38 mEq/L — ABNORMAL HIGH (ref 19–32)
Calcium: 9.5 mg/dL (ref 8.4–10.5)
Chloride: 97 mEq/L (ref 96–112)
Creatinine, Ser: 0.78 mg/dL (ref 0.50–1.10)
GFR calc Af Amer: >90 mL/min (ref >90)
GFR, EST NON AFRICAN AMERICAN: 82 mL/min — AB (ref >90)
GLUCOSE: 94 mg/dL (ref 70–99)
Potassium: 4 mEq/L (ref 3.7–5.3)
Sodium: 145 mEq/L (ref 137–147)

## 2014-02-22 LAB — CBC
HCT: 39.7 % (ref 36.0–46.0)
HEMOGLOBIN: 12.6 g/dL (ref 12.0–15.0)
MCH: 27.2 pg (ref 26.0–34.0)
MCHC: 31.7 g/dL (ref 30.0–36.0)
MCV: 85.6 fL (ref 78.0–100.0)
Platelets: 158 10*3/uL (ref 150–400)
RBC: 4.64 MIL/uL (ref 3.87–5.11)
RDW: 15.9 % — ABNORMAL HIGH (ref 11.5–15.5)
WBC: 12.4 10*3/uL — ABNORMAL HIGH (ref 4.0–10.5)

## 2014-02-23 NOTE — Progress Notes (Signed)
   Name: [name] MRN: [id number] DOB: 24-Jun-1941    ADMISSION DATE:  02/06/2014 CONSULTATION DATE:  4/21  REFERRING MD :  Laren Everts  PRIMARY SERVICE:  Hijazi   CHIEF COMPLAINT:  COPD  BRIEF PATIENT DESCRIPTION:   This is a 73 year old female w/ chronic resp failure transferred to [facility] s/p hospitalization at [facility] from 4/8 to 4/20 for acute on chronic resp failure requiring mechanical ventilation in due to AECOPD in setting of MSSA purulent bronchitis +/- element of volume excess. PCCM asked to see for ongoing pulm support.     LINES / TUBES:  4/11 ETT>>>4/15  4/11 [place]>>>   CULTURES:  4/8 BC>>>negative  4/12 sputum>>>abundant MSSA  4/13 Resp Virus panel >>negative   ANTIBIOTICS:  Levofloxacin 4/9>>>4/14  vanc 4/13>>>4/14 Ancef 4/14>>>  CODE STATUS She is OK with short term BIPAP, pressors, IVFs. No CPR, defib, intubation or cardioversion.   SUBJECTIVE:  No distress  VITAL SIGNS:   3 liters  PHYSICAL EXAMINATION: General:  No acute distress.  Neuro:  Awake, alert w/ no focal def.  HEENT:  [place], voice quality cont to improve  Cardiovascular:  rrr Lungs:  Exp wheeze post bases  Abdomen:  Soft, non-tender + bowel sounds  Musculoskeletal:  Intact  Skin:  Intact    Recent Labs Lab 02/18/14 0420 02/20/14 0500 02/22/14 0625  NA 144 141 145  K 3.8 3.8 4.0  CL 95* 93* 97  CO2 36* 38* 38*  BUN 33* 29* 28*  CREATININE 0.69 0.65 0.78  GLUCOSE 95 120* 94    Recent Labs Lab 02/18/14 0420 02/20/14 0500 02/22/14 0625  HGB 12.8 12.6 12.6  HCT 40.9 40.7 39.7  WBC 14.4* 12.2* 12.4*  PLT 185 165 158   No results found.  ASSESSMENT / PLAN:  Acute on chronic respiratory failure due to AECOPD, MSSA purulent bronchitis, further complicated by element of volume overload.  aspiration/Dysphagia  Hx of CAD, systolic CHF: ECHO 01/29/86: Overall LVEF appears to be mild to moderately depressed. There is akinesis of the apex  Benzo dep, PTSD from last ETT.    Deconditioning...  Muscular weakness.> had been on steroids and neuromuscular blockade.  Discussion She is looking stronger day by day Has significant esophageal dysmotility. SLP thinks that this is a major contributing factor to her aspiration risk. Does best w/ D 1/thin liquids. She does not like eating pureed foods. We discussed quality she's not ready to switch to comfort feeds alone, her main concern remains what will happen if I go to a restaurant and have to eat puree food. Plan:  Oxygen to keep SpO2 > 88%  F/u CXR intermittently  Cont dysphagia precautions-->will need on-going SLP support DNI  Taper steroids as tolerated  Continue brovana (performist), pulmicort, and spiriva with prn albuterol  Continue ASA, plavix, lopressor and lasix  Continue buspar, klonopin, neurontin, PRN ativan Needs extensive PT and OT  She is OK with short term BIPAP, pressors, IVFs. No CPR, defib, intubation or cardioversion. See initial consult If deteriorates then will need discussion to transition to comfort care.  Rush Farmer, M.D. [facility] Pulmonary/Critical Care Medicine. Pager: [phone]. After hours pager: [phone].

## 2014-02-27 LAB — CBC WITH DIFFERENTIAL/PLATELET
BASOS PCT: 0 % (ref 0–1)
Basophils Absolute: 0 10*3/uL (ref 0.0–0.1)
Eosinophils Absolute: 0.1 10*3/uL (ref 0.0–0.7)
Eosinophils Relative: 1 % (ref 0–5)
HCT: 36.8 % (ref 36.0–46.0)
Hemoglobin: 11.5 g/dL — ABNORMAL LOW (ref 12.0–15.0)
Lymphocytes Relative: 26 % (ref 12–46)
Lymphs Abs: 2.6 10*3/uL (ref 0.7–4.0)
MCH: 26.9 pg (ref 26.0–34.0)
MCHC: 31.3 g/dL (ref 30.0–36.0)
MCV: 86.2 fL (ref 78.0–100.0)
Monocytes Absolute: 0.6 10*3/uL (ref 0.1–1.0)
Monocytes Relative: 6 % (ref 3–12)
NEUTROS PCT: 67 % (ref 43–77)
Neutro Abs: 6.5 10*3/uL (ref 1.7–7.7)
PLATELETS: 124 10*3/uL — AB (ref 150–400)
RBC: 4.27 MIL/uL (ref 3.87–5.11)
RDW: 16.3 % — ABNORMAL HIGH (ref 11.5–15.5)
WBC: 9.9 10*3/uL (ref 4.0–10.5)

## 2014-02-27 LAB — HEMOGLOBIN A1C
HEMOGLOBIN A1C: 6.7 % — AB (ref <5.7)
MEAN PLASMA GLUCOSE: 146 mg/dL — AB (ref <117)

## 2014-02-27 LAB — PREALBUMIN: Prealbumin: 25.4 mg/dL (ref 17.0–34.0)

## 2014-02-27 LAB — MAGNESIUM: MAGNESIUM: 2.3 mg/dL (ref 1.5–2.5)

## 2014-02-27 LAB — BASIC METABOLIC PANEL
BUN: 20 mg/dL (ref 6–23)
CALCIUM: 9.3 mg/dL (ref 8.4–10.5)
CHLORIDE: 98 meq/L (ref 96–112)
CO2: 33 meq/L — AB (ref 19–32)
Creatinine, Ser: 0.7 mg/dL (ref 0.50–1.10)
GFR calc Af Amer: >90 mL/min (ref >90)
GFR calc non Af Amer: 84 mL/min — ABNORMAL LOW (ref >90)
GLUCOSE: 92 mg/dL (ref 70–99)
Potassium: 3.3 mEq/L — ABNORMAL LOW (ref 3.7–5.3)
Sodium: 143 mEq/L (ref 137–147)

## 2014-02-27 LAB — PHOSPHORUS: Phosphorus: 4.6 mg/dL (ref 2.3–4.6)

## 2014-02-27 NOTE — Progress Notes (Signed)
   Name: [name] MRN: [id number] DOB: 1941-01-10    ADMISSION DATE:  02/06/2014 CONSULTATION DATE:  4/21  REFERRING MD :  Laren Everts  PRIMARY SERVICE:  Hijazi   CHIEF COMPLAINT:  COPD  BRIEF PATIENT DESCRIPTION:   This is a 73 year old female w/ chronic resp failure transferred to [facility] s/p hospitalization at [facility] from 4/8 to 4/20 for acute on chronic resp failure requiring mechanical ventilation in due to AECOPD in setting of MSSA purulent bronchitis +/- element of volume excess. PCCM asked to see for ongoing pulm support.     LINES / TUBES:  4/11 ETT>>>4/15  4/11 [place]>>>   CULTURES:  4/8 BC>>>negative  4/12 sputum>>>abundant MSSA  4/13 Resp Virus panel >>negative   ANTIBIOTICS:  Levofloxacin 4/9>>>4/14  vanc 4/13>>>4/14 Ancef 4/14>>>  CODE STATUS She is OK with short term BIPAP, pressors, IVFs. No CPR, defib, intubation or cardioversion.   SUBJECTIVE:  No distress  VITAL SIGNS:   3 liters  PHYSICAL EXAMINATION: General:  No acute distress.  Neuro:  Awake, alert w/ no focal def.  HEENT:  [place], voice quality cont to improve  Cardiovascular:  rrr Lungs:  Exp wheeze post bases  Abdomen:  Soft, non-tender + bowel sounds  Musculoskeletal:  Intact  Skin:  Intact    Recent Labs Lab 02/22/14 0625 02/27/14 0500  NA 145 143  K 4.0 3.3*  CL 97 98  CO2 38* 33*  BUN 28* 20  CREATININE 0.78 0.70  GLUCOSE 94 92    Recent Labs Lab 02/22/14 0625 02/27/14 0500  HGB 12.6 11.5*  HCT 39.7 36.8  WBC 12.4* 9.9  PLT 158 124*   No results found.  ASSESSMENT / PLAN:  Acute on chronic respiratory failure due to AECOPD, MSSA purulent bronchitis, further complicated by element of volume overload.  aspiration/Dysphagia w severe esophageal dysmotility  Hx of CAD, systolic CHF: ECHO 9/38/10: Overall LVEF appears to be mild to moderately depressed. There is akinesis of the apex  Benzo dep, PTSD from last ETT.  Deconditioning...  Muscular weakness.> had been on steroids and  neuromuscular blockade.  Discussion Ready for d/c  Plan:  Home on O2 Cont dysphagia precautions-->will need on-going SLP support DNI  Taper steroids as tolerated, consider dropping to 10mg /day  Continue brovana (or performist), pulmicort , and spiriva with prn albuterol  Alternatives that would also be acceptable: Spiriva and advair or symbicort w/ PRN albuterol OR Anoro-Ellipta w/ scheduled budesonide and PRN albuterol  Continue ASA, plavix, lopressor and lasix  Continue buspar, klonopin, neurontin, PRN ativan She needs to see Dr [name] in 3-4 weeks    Lavon Paganini. Titus Mould, MD, [place] Pgr: [facility]

## 2014-03-14 ENCOUNTER — Telehealth: Payer: Self-pay | Admitting: Pulmonary Disease

## 2014-03-14 NOTE — Telephone Encounter (Signed)
Spoke with pt. She is requesting a refill on Hycodan cough syrup.  Last OV 01/23/14 Last fill 01/25/14 #172mL  RA - please advise on refill. Thanks.

## 2014-03-15 MED ORDER — HYDROCODONE-HOMATROPINE 5-1.5 MG/5ML PO SYRP: 5.0000 mL | ORAL_SOLUTION | Freq: Two times a day (BID) | ORAL | Status: DC | PRN

## 2014-03-15 NOTE — Telephone Encounter (Signed)
Pt called back-she has recall in EPIC to set up appt with RA. Alos, she is aware that RA approved the Rx refill and KC to sign for RA as he is not in the office setting this afternoon. Pt will send daughter to pick up Rx this afternoon. Nothing more needed at this time.

## 2014-03-15 NOTE — Telephone Encounter (Signed)
Ok to refill Pl ensure FU appt with me or TP

## 2014-03-16 ENCOUNTER — Telehealth: Payer: Self-pay | Admitting: Pulmonary Disease

## 2014-03-16 MED ORDER — AMOXICILLIN-POT CLAVULANATE 500-125 MG PO TABS: 1.0000 | ORAL_TABLET | Freq: Two times a day (BID) | ORAL | Status: DC

## 2014-03-16 NOTE — Telephone Encounter (Signed)
Per CY-lets give patient Augmentin 500mg  #14 take 1 po BID no refills.

## 2014-03-16 NOTE — Telephone Encounter (Signed)
Spoke with [facility] per CY Requests that rx be printed and faxed to them at [phone]   Nothing further needed.

## 2014-03-16 NOTE — Telephone Encounter (Signed)
Spoke with Mingo Amber-- Pt had cxr today at facility and daughter is requesting that this is addressed today Need to know if patient is to be started on any medications according to results. Aware that RA not in office until Monday 03/20/14, will not be able to view these faxed results-- will have CDY look at the results and advise.  Results are being faxed to triage fax #  Allergies  Allergen Reactions  . Escitalopram Oxalate Shortness Of Breath, Diarrhea and Nausea And Vomiting    Hot flashes, dizziness  . Indomethacin Other (See Comments)    "made me very very dizzy and made me have vertigo" (08/31/2012)  . Nsaids     Jerking, hot flashes, nausea  . Codeine Nausea And Vomiting    "I can take codeine in my cough medicine" (08/31/2012)  . Sulfonamide Derivatives Other (See Comments)    Drug induced hepatitis in 1989   Please advise Dr Annamaria Boots as Dr [name] is not in office to view these results today. Thanks.

## 2014-03-17 ENCOUNTER — Telehealth: Payer: Self-pay | Admitting: Pulmonary Disease

## 2014-03-17 MED ORDER — LEVOFLOXACIN 500 MG PO TABS: 500.0000 mg | ORAL_TABLET | Freq: Every day | ORAL | Status: DC

## 2014-03-17 NOTE — Telephone Encounter (Signed)
ATC pt x 3 Mailbox full Contacted pt on facility number [phone] Spoke with patient--aware that RA did not answer message, she will need to schedule with another  RA only in office in [place] 6/1, 6/10, 6/15 Next time in [place] is 6/11 Pt still refused to schedule appt with TP or any other physcian  Gave patient first available with RA--Pt scheduled for appt with RA 03/20/14 @ 11:30 per patient request.  Meds/orders and notes have been received per Mingo Amber and will be started this weekend.   Nothing further needed.

## 2014-03-17 NOTE — Telephone Encounter (Signed)
abx and phone note faxed to [facility] @ [phone] for new orders/meds to be filled.

## 2014-03-17 NOTE — Telephone Encounter (Addendum)
LMOM x 1 

## 2014-03-17 NOTE — Telephone Encounter (Signed)
Called and spoke with pt and she stated that she lives at [facility] and she had a cxr done yesterday.  Told that she has PNA and CY called in augmentin for her.  Pt stated that she is on pred 20 mg daily.  She stated that RA knows how fast she gets into trouble when she gets sick.  Pt stated that her daughter is a Marine scientist and told her that the augmentin is not strong enough for her.  Pt wanted to see if a stronger abx is needed and is she needs to increase her prednisone to 40 mg daily until this clears up.  Pt is aware that we will send this to RA for further recs.  Please advise. Thanks  Allergies  Allergen Reactions  . Escitalopram Oxalate Shortness Of Breath, Diarrhea and Nausea And Vomiting    Hot flashes, dizziness  . Indomethacin Other (See Comments)    "made me very very dizzy and made me have vertigo" (08/31/2012)  . Nsaids     Jerking, hot flashes, nausea  . Codeine Nausea And Vomiting    "I can take codeine in my cough medicine" (08/31/2012)  . Sulfonamide Derivatives Other (See Comments)    Drug induced hepatitis in 1989    Current Outpatient Prescriptions on File Prior to Visit  Medication Sig Dispense Refill  . acetaminophen (TYLENOL) 325 MG tablet Take 2 tablets (650 mg total) by mouth every 6 (six) hours as needed for mild pain, fever or headache.      . albuterol (PROVENTIL) (2.5 MG/3ML) 0.083% nebulizer solution Take 2.5 mg by nebulization every 6 (six) hours as needed for wheezing or shortness of breath.      Marland Kitchen albuterol (PROVENTIL) (2.5 MG/3ML) 0.083% nebulizer solution Take 3 mLs (2.5 mg total) by nebulization every 2 (two) hours as needed for wheezing or shortness of breath.  75 mL  12  . amoxicillin-clavulanate (AUGMENTIN) 500-125 MG per tablet Take 1 tablet (500 mg total) by mouth 2 (two) times daily.  14 tablet  0  . anastrozole (ARIMIDEX) 1 MG tablet Take 1 mg by mouth daily.      Marland Kitchen antiseptic oral rinse (BIOTENE) LIQD 15 mLs by Mouth Rinse route 2 times daily at 12  noon and 4 pm.      . arformoterol (BROVANA) 15 MCG/2ML NEBU Take 2 mLs (15 mcg total) by nebulization 2 (two) times daily.  120 mL    . aspirin EC 81 MG tablet Take 81 mg by mouth daily.      . B Complex Vitamins (B-COMPLEX/B-12 PO) Take 1 tablet by mouth daily.      . budesonide (PULMICORT) 0.5 MG/2ML nebulizer solution Take 2 mLs (0.5 mg total) by nebulization 2 (two) times daily.    12  . busPIRone (BUSPAR) 15 MG tablet Take 15 mg by mouth 2 (two) times daily.      . Calcium Carb-Cholecalciferol (CALCIUM 600 + D PO) Take 0.5 tablets by mouth 2 (two) times daily.      Marland Kitchen ceFAZolin (ANCEF) 1-5 GM-% Inject 50 mLs (1 g total) into the vein every 8 (eight) hours.  50 mL    . Cholecalciferol (VITAMIN D) 2000 UNITS CAPS Take 1 capsule by mouth 2 (two) times daily.      . clonazePAM (KLONOPIN) 1 MG tablet Take 1 tablet (1 mg total) by mouth every 12 (twelve) hours.  30 tablet  0  . clopidogrel (PLAVIX) 75 MG tablet Take 1 tablet (75 mg total)  by mouth daily.  30 tablet  3  . CRANBERRY PO Take 1 tablet by mouth daily.      Marland Kitchen docusate sodium 100 MG CAPS Take 100 mg by mouth daily.  10 capsule  0  . enoxaparin (LOVENOX) 40 MG/0.4ML injection Inject 0.4 mLs (40 mg total) into the skin daily.  0 Syringe    . feeding supplement, ENSURE COMPLETE, (ENSURE COMPLETE) LIQD Take 237 mLs by mouth 2 (two) times daily between meals.      . furosemide (LASIX) 40 MG tablet Take 1 tablet (40 mg total) by mouth 2 (two) times daily.  30 tablet    . gabapentin (NEURONTIN) 300 MG capsule Take 300 mg by mouth at bedtime.      Marland Kitchen HYDROcodone-homatropine (HYCODAN) 5-1.5 MG/5ML syrup Take 5 mLs by mouth 2 (two) times daily as needed for cough.  120 mL  0  . insulin aspart (NOVOLOG) 100 UNIT/ML injection Inject 0-20 Units into the skin 3 (three) times daily with meals.  10 mL  11  . lidocaine (LIDODERM) 5 % Place 1 patch onto the skin at bedtime. Remove & Discard patch within 12 hours or as directed by MD  30 patch  0  .  LORazepam (ATIVAN) 2 MG/ML injection Inject 0.25 mLs (0.5 mg total) into the vein every 4 (four) hours as needed for anxiety.  1 mL  0  . methylPREDNISolone sodium succinate (SOLU-MEDROL) 40 mg/mL injection Inject 1 mL (40 mg total) into the vein every 12 (twelve) hours.  1 each  0  . metoprolol tartrate (LOPRESSOR) 25 MG tablet Take 12.5 mg by mouth daily.      . Multiple Vitamin (MULTIVITAMIN WITH MINERALS) TABS tablet Take 0.5 tablets by mouth 2 (two) times daily.      Marland Kitchen nystatin (MYCOSTATIN) 100000 UNIT/ML suspension Take 5 mLs (500,000 Units total) by mouth 2 (two) times daily.  120 mL  0  . Omega-3 Fatty Acids (FISH OIL) 1200 MG CAPS Take 1 capsule by mouth daily.      Marland Kitchen oxybutynin (DITROPAN) 5 MG tablet Take 5 mg by mouth at bedtime.       . pantoprazole (PROTONIX) 40 MG tablet Take 1 tablet (40 mg total) by mouth 2 (two) times daily.  60 tablet  0  . polyethylene glycol (MIRALAX / GLYCOLAX) packet Take 17 g by mouth daily as needed.  14 each  0  . potassium chloride SA (K-DUR,KLOR-CON) 20 MEQ tablet Take 20 mEq by mouth daily.      . Pyridoxine HCl (VITAMIN B-6 PO) Take 1 tablet by mouth daily.      Marland Kitchen saccharomyces boulardii (FLORASTOR) 250 MG capsule Take 1 capsule (250 mg total) by mouth 2 (two) times daily.      . simvastatin (ZOCOR) 40 MG tablet Take 40 mg by mouth daily.      Marland Kitchen tiotropium (SPIRIVA) 18 MCG inhalation capsule Place 18 mcg into inhaler and inhale daily.      . traMADol (ULTRAM) 50 MG tablet Take 75 mg by mouth every 6 (six) hours as needed for moderate pain.       No current facility-administered medications on file prior to visit.

## 2014-03-17 NOTE — Telephone Encounter (Signed)
Please advise Dr [name], patient is refusing to see any other physician other than you. Please advise if patient can be seen Monday 03/20/14 double book in PM?? ( RA paged)

## 2014-03-17 NOTE — Telephone Encounter (Signed)
Can change to levaquin 500 daily x 7days OK to increase prednisone to 40 mg x 3 days if increased wheezing, otherwise stay at 20 mg Can she come in for OV? -see TP or other provider next week to ensure she is responding to Rx

## 2014-03-17 NOTE — Telephone Encounter (Signed)
Rx signed and returned to triage with phone note  Thanks

## 2014-03-17 NOTE — Telephone Encounter (Signed)
Pt aware of medications to be faxed to facility [facility] Phone note to be faxed as well with instructions to increase Prednisone. Per shandra yesterday, any new orders or change in medications has to be sent over via fax so that they have a hard copy. They do not take verbals. Will fax everything to [phone]   Will need Dr Melvyn Novas to sign Rx as Dr [name] is not in office today and this is needing to be faxed. ----------------- Please advise Dr [name], patient is refusing to see any other physician other than you. Please advise if patient can be seen Monday 03/20/14 double book in PM??

## 2014-03-20 ENCOUNTER — Encounter: Payer: Self-pay | Admitting: Pulmonary Disease

## 2014-03-20 ENCOUNTER — Ambulatory Visit: Payer: Medicare Other | Admitting: Pulmonary Disease

## 2014-03-20 VITALS — BP 112/70 | HR 80 | Temp 97.0°F | Wt 172.2 lb

## 2014-03-20 DIAGNOSIS — I2589 Other forms of chronic ischemic heart disease: Secondary | ICD-10-CM

## 2014-03-20 DIAGNOSIS — J9621 Acute and chronic respiratory failure with hypoxia: Secondary | ICD-10-CM

## 2014-03-20 DIAGNOSIS — J441 Chronic obstructive pulmonary disease with (acute) exacerbation: Secondary | ICD-10-CM

## 2014-03-20 NOTE — Assessment & Plan Note (Signed)
There seems to be some duplication of therapy. On have asked her to stop Anoro. She can continue on Pulmicort,brovana and Spiriva Levaquin course can be taken for 7 days Prednisone can be tapered by 10 mg every 2 days to off

## 2014-03-20 NOTE — Patient Instructions (Signed)
Orders given for change of meds I will speak to social worker & let you know

## 2014-03-20 NOTE — Assessment & Plan Note (Signed)
It does seem like she may benefit from some more physical therapy Clearly her rehabilitation therapists out of the best position to determine this. I should for the patient and her daughter that I would provide whatever documentation required to support this. I made to attempt to contact the social worker at [facility] and left messages

## 2014-03-20 NOTE — Telephone Encounter (Signed)
OK to overbook today?

## 2014-03-20 NOTE — Assessment & Plan Note (Signed)
Lasix can be increased to 80 mg every morning and 40 mg every afternoon for 5 days with potassium supplementation

## 2014-03-20 NOTE — Telephone Encounter (Signed)
Pt scheduled for appt with RA 03/20/14 @ 11:30 per patient request. Nothing further needed.

## 2014-03-20 NOTE — Progress Notes (Signed)
Subjective:    Patient ID: [name], female    DOB: [birth year], 73 y.o.   MRN: [id number]  HPI  PCP - Linna Darner   73/F, ex smoker, realtor with Gold C COPD , on nocturnal O2 since 10/10 for FU.  12/2009 FEV1 1.0 L (48%)  She smoked for 40 years and quit in 1999. Advair worked better than symbicort.  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  Cardiac cath 08/31/12 with severe stenosis proximal LADs/p drug eluting stent proximal LAD  She went to [facility] for a second opinion - rehab & Theophylline advised    09/2013 Underwent surgery Left breast for breast cancer  Developed post op infectious complications   04/176 hospitalization for a severe flare of COPD  She was initially admitted to [facility] ICU and required intubation and mechanical ventilation for several days (Flu + MSSA pneumonia) [facility] Then to a nursing facility for the last 3 weeks. ([facility]).  Breo was not helping. She was given duo neb 4 times a day.  >>> was on pulmicort/ performist X 2 months - has supply of advair at home  Stopped lasix , c/o pedal edema  Now on 24h o2  Getting HHPT  She has questions about timing of right shoulder surgery ('bone on bone') & breast surgery  03/20/2014  transferred to [facility] s/p hospitalization at [facility] from 4/8 to 02/06/14 for acute on chronic resp failure requiring mechanical ventilation in due to AECOPD in setting of MSSA purulent bronchitis  She requested DNI status as well as DNR status. Severe esophageal dysmotility was noted on swallow eval >> improved Now at [facility] Per phone note -she had a cxr done 5/28. Told that she has PNA and CY called in  augmentin for her. Pt stated that she is on pred 20 mg daily. She stated that RA knows how fast she gets into trouble when she gets sick. Pt stated that her daughter is a Marine scientist and told her that the augmentin is not strong enough for her>> changed to levaquin + prednisone Review of medications shows Pulmicort, brovana, Spiriva, and Anoro She is on Lasix 40 twice a day  She is being discharged from [facility] to home since she is received maximum benefit of physical therapy -most of the visit today was spent in discussing this. Some conflict between the patient and her daughter was apparent  Labs showed negative C. Difficile, white count of 10.7 and potassium of 2.5  Past Medical History  Diagnosis Date  . Other emphysema   . Unspecified arthropathy, shoulder region   . Gastric ulcer, unspecified as acute or chronic, without mention of hemorrhage, perforation, or obstruction   . Benign paroxysmal positional vertigo   . COPD (chronic obstructive pulmonary disease)   . Diverticula of colon   . Osteopenia   . Personal history of colonic polyps 2007    tubular adenoma high grade dysplasia  . Diverticulitis   . Hyperlipemia   . Fracture of ankle, bimalleolar, right, closed   . Hypoxemia   . C. difficile diarrhea   . Varicose vein   . Asthma   . GERD (gastroesophageal reflux disease)   .  Hepatitis 1989  . Arthritis     "both shoulders"   . TIA (transient ischemic attack) 04/2008; 08/21/2012    "slight droop left lower lip after 08/21/12 TIA"  . On home oxygen therapy   . Ischemic cardiomyopathy     a. EF 45-50% 2011. b. EF 35-40% by cath 09/01/12.  . Night terrors, adult     dr Leonie Man referred  for PSG in 2011, and again in 2013   . Breast cancer 04/08/13 bx    left  . Allergy   . CAD (coronary artery disease)     a. ant MI 40 tx with TPA/PTCA. b. Prior stenting procedures including RCA stent, DES to LCx 04/2011. c. [place] -> DES  to prox LAD 08/2012.   Marland Kitchen Hypertension     TAKES FOR  H/O STROKE  . CVA (cerebral vascular accident)   . Stroke, acute, embolic     Nov 8657 , [facility] , was treated with TPA.   . 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 Path G1 IDC, 1.6 cm, neg margin, neg SLN      Review of Systems neg for any significant sore throat, dysphagia, itching, sneezing, nasal congestion or excess/ purulent secretions, fever, chills, sweats, unintended wt loss, pleuritic or exertional cp, hempoptysis, orthopnea pnd or change in chronic leg swelling. Also denies presyncope, palpitations, heartburn, abdominal pain, nausea, vomiting, diarrhea or change in bowel or urinary habits, dysuria,hematuria, rash, arthralgias, visual complaints, headache, numbness weakness or ataxia.     Objective:   Physical Exam  Gen. Pleasant, chronically ill, in no distress, anxious affect, in a wheelchair ENT - no lesions, no post nasal drip, no thrush Neck: No JVD, no thyromegaly, no carotid bruits Lungs: no use of accessory muscles, no dullness to percussion, decreased without rales or rhonchi  Cardiovascular: Rhythm regular, heart sounds  normal, no murmurs or gallops, 1+ peripheral edema Abdomen: soft and non-tender, no hepatosplenomegaly, BS normal. Musculoskeletal: No deformities, no cyanosis or clubbing Neuro:  alert, non focal       Assessment & Plan:

## 2014-03-24 ENCOUNTER — Ambulatory Visit: Payer: Medicare Other | Admitting: Cardiovascular Disease

## 2014-03-28 ENCOUNTER — Telehealth: Payer: Self-pay | Admitting: Pulmonary Disease

## 2014-03-28 NOTE — Telephone Encounter (Signed)
Ok to approve 

## 2014-03-28 NOTE — Telephone Encounter (Signed)
Called spoke with Radovan. He is requesting VO for OT twice a week x 4 weeks for right shoulder pain management, ROM and strengthening, home exercise, ADL/IDL adaptive. Please advise RA thanks

## 2014-03-29 NOTE — Telephone Encounter (Signed)
I called gave VO. Nothing further needed

## 2014-03-30 ENCOUNTER — Telehealth: Payer: Self-pay

## 2014-03-30 ENCOUNTER — Other Ambulatory Visit: Payer: Self-pay | Admitting: Internal Medicine

## 2014-03-30 DIAGNOSIS — R1319 Other dysphagia: Secondary | ICD-10-CM

## 2014-03-30 NOTE — Telephone Encounter (Signed)
Gregary Cromer a speech therapist with [facility] requests an order for dysphagia therapy for this pt 2 times weekly for 4 weeks. Please advise

## 2014-04-13 ENCOUNTER — Ambulatory Visit (INDEPENDENT_AMBULATORY_CARE_PROVIDER_SITE_OTHER): Payer: Medicare Other | Admitting: Internal Medicine

## 2014-04-13 ENCOUNTER — Encounter: Payer: Self-pay | Admitting: Internal Medicine

## 2014-04-13 VITALS — BP 130/76 | HR 104 | Temp 97.9°F | Wt 164.8 lb

## 2014-04-13 DIAGNOSIS — Z8719 Personal history of other diseases of the digestive system: Secondary | ICD-10-CM

## 2014-04-13 DIAGNOSIS — M19019 Primary osteoarthritis, unspecified shoulder: Secondary | ICD-10-CM

## 2014-04-13 DIAGNOSIS — I2510 Atherosclerotic heart disease of native coronary artery without angina pectoris: Secondary | ICD-10-CM

## 2014-04-13 DIAGNOSIS — R5381 Other malaise: Secondary | ICD-10-CM

## 2014-04-13 DIAGNOSIS — J449 Chronic obstructive pulmonary disease, unspecified: Secondary | ICD-10-CM

## 2014-04-13 MED ORDER — OXYCODONE HCL 5 MG PO TABS: 5.0000 mg | ORAL_TABLET | Freq: Four times a day (QID) | ORAL | Status: DC | PRN

## 2014-04-13 MED ORDER — LIDOCAINE 5 % EX PTCH: 1.0000 | MEDICATED_PATCH | Freq: Every day | CUTANEOUS | Status: DC

## 2014-04-13 MED ORDER — HYDROCODONE-HOMATROPINE 5-1.5 MG/5ML PO SYRP: 5.0000 mL | ORAL_SOLUTION | Freq: Two times a day (BID) | ORAL | Status: DC | PRN

## 2014-04-13 MED ORDER — GUAIFENESIN 400 MG PO TABS: ORAL_TABLET | ORAL | Status: DC

## 2014-04-13 NOTE — Progress Notes (Signed)
Scripts for Lidoderm and Humibid have been faxed to [facility] [phone]

## 2014-04-13 NOTE — Telephone Encounter (Signed)
Diagnostic code needed for referral

## 2014-04-13 NOTE — Patient Instructions (Addendum)
Please take a probiotic , Florastor every day until the bowels are normal. This will replace the normal bacteria which  are necessary for formation of normal stool and processing of food.   Please discuss with Dr.Alva the optimal management of your pulmonary secretions. He should determine whether you need a cough suppressant long-term. The guaifenesin and the cough suppressant potentially could work against each other. If your shoulder pain is not controlled by the lidocaine and infrequent narcotic pain medication; I will refer you to the chronic pain clinic. The narcotic pain medicines have potential for suppressing respiratory function.

## 2014-04-13 NOTE — Progress Notes (Signed)
Pre visit review using our clinic review tool, if applicable. No additional management support is needed unless otherwise documented below in the visit note. 

## 2014-04-14 NOTE — Progress Notes (Signed)
   Subjective:    Patient ID: [name], female    DOB: Dec 27, 1940, 73 y.o.   MRN: [id number]  HPI   She is here with her daughter who is a nurse to have her medications refilled; she has not been seen for over two years. In the interval she's been hospitalized with acute on chronic respiratory failure on two occasions in January and April of this year (records reviewed). She was ventilator dependent during both admissions. She continues to have " wet cough "which is productive of yellow sputum. During the last hospitalization cultures revealed methicillin sensitive staph aureus. She been using a guaifenesin product to help mobilize secretions but then uses a cough suppressant syrup at night so she can rest. She is followed by Dr [name].Past history includes diverticulitis with associated resection of1 foot of her colon.      Review of Systems  Additionally she has been using Imodium A/D for frank diarrhea. Stools are described as loose to watery. She had been prescribed a probiotic at the last hospitalization but this has not been continued. She also has been on a narcotic pain medicine. She states that she takes less than two pills a week. The potential risk of  respiratory suppression was discussed with her. She's also using lidocaine patches.      Objective:   Physical Exam  Significant or distinguishing  findings on physical exam are documented first.  Below that are other systems examined & findings.  She appears chronically ill; she is in no acute distress. She's wearing nasal oxygen.  Upper and lower partials.  Weight excess is present. Breath sounds are generally decreased. There's respiratory variation in her heart rhythm and rate. Abdomen is protuberant but nontender. No organomegaly amasses present. She has one half plus edema. Clubbing is present w/o cyanosis.  Eyes: No conjunctival inflammation or scleral icterus is present. Oral exam:  lips and gums are healthy  appearing.There is no oropharyngeal erythema or exudate noted.  Heart:  S1 and S2 normal without gallop, murmur, click, rub or other extra sounds   Abdomen: No tenderness over the flanks to percussion. Musculoskeletal: Using rolling walker Skin:Warm & dry.  Intact without suspicious lesions or rashes ; no jaundice. Slight tenting Lymphatic: No lymphadenopathy is noted about the head, neck, axilla              Assessment & Plan:  See  Diagnoses, orders & plan

## 2014-04-15 ENCOUNTER — Other Ambulatory Visit: Payer: Self-pay | Admitting: Cardiovascular Disease

## 2014-04-17 ENCOUNTER — Telehealth: Payer: Self-pay

## 2014-04-17 NOTE — Telephone Encounter (Signed)
Prior authorization is required for Lidocaine. Form has been submitted via covermymeds. Waiting for insurance response.

## 2014-04-18 NOTE — Telephone Encounter (Signed)
Insurance denied coverage for Lidocaine

## 2014-04-22 ENCOUNTER — Encounter: Payer: Self-pay | Admitting: Internal Medicine

## 2014-04-24 ENCOUNTER — Telehealth: Payer: Self-pay | Admitting: Pulmonary Disease

## 2014-04-24 DIAGNOSIS — J449 Chronic obstructive pulmonary disease, unspecified: Secondary | ICD-10-CM

## 2014-04-24 NOTE — Telephone Encounter (Signed)
Spoke with the pt  She is requesting that we send order to [facility] for pulsed POC  Please advise if this is okay to send, thanks!

## 2014-04-24 NOTE — Telephone Encounter (Signed)
Okay to evaluate for portable concentrator

## 2014-04-25 NOTE — Telephone Encounter (Signed)
Order placed, Nothing further needed

## 2014-04-25 NOTE — Telephone Encounter (Signed)
[facility] that they do not supply portable O2 concentrators due to medicare rate cuts. According to them she already has the smallest tank they carry.  I called pt and made her aware. She reports her contract with them expires in October and will see then. Nothing further needed

## 2014-05-03 ENCOUNTER — Telehealth: Payer: Self-pay | Admitting: Pulmonary Disease

## 2014-05-03 NOTE — Telephone Encounter (Signed)
lmomtcb x1 

## 2014-05-04 NOTE — Telephone Encounter (Signed)
Pt states nothing is needed at this time. [place] Bing, CMA

## 2014-05-08 ENCOUNTER — Telehealth: Payer: Self-pay | Admitting: Oncology

## 2014-05-08 ENCOUNTER — Ambulatory Visit: Payer: Medicare Other | Admitting: Internal Medicine

## 2014-05-08 ENCOUNTER — Encounter: Payer: Self-pay | Admitting: Internal Medicine

## 2014-05-08 ENCOUNTER — Ambulatory Visit (INDEPENDENT_AMBULATORY_CARE_PROVIDER_SITE_OTHER)
Admission: RE | Admit: 2014-05-08 | Discharge: 2014-05-08 | Disposition: A | Discharge status: Home / Self Care | Payer: Medicare Other | Source: Ambulatory Visit | Attending: Internal Medicine | Admitting: Internal Medicine

## 2014-05-08 VITALS — BP 126/72 | HR 100 | Temp 98.4°F | Ht 64.0 in | Wt 164.0 lb

## 2014-05-08 DIAGNOSIS — J9612 Chronic respiratory failure with hypercapnia: Secondary | ICD-10-CM | POA: Insufficient documentation

## 2014-05-08 DIAGNOSIS — J449 Chronic obstructive pulmonary disease, unspecified: Secondary | ICD-10-CM

## 2014-05-08 MED ORDER — PREDNISONE 10 MG PO TABS: ORAL_TABLET | ORAL | Status: DC

## 2014-05-08 MED ORDER — FUROSEMIDE 40 MG PO TABS: 40.0000 mg | ORAL_TABLET | Freq: Two times a day (BID) | ORAL | Status: DC

## 2014-05-08 MED ORDER — OXYCODONE HCL 5 MG PO TABS: 5.0000 mg | ORAL_TABLET | ORAL | Status: AC | PRN

## 2014-05-08 MED ORDER — BUSPIRONE HCL 15 MG PO TABS: 15.0000 mg | ORAL_TABLET | Freq: Two times a day (BID) | ORAL | Status: AC

## 2014-05-08 MED ORDER — BUSPIRONE HCL 15 MG PO TABS: 15.0000 mg | ORAL_TABLET | Freq: Two times a day (BID) | ORAL | Status: DC

## 2014-05-08 NOTE — Patient Instructions (Addendum)
Doxycycline  one twice x 7-10 days before eating with glass of water   Prednisone 10 mg take  4 each am x 2 days,   2 each am x 2 days,  1 each am x 2 days and stop   Protonix 40 mg Take 30- 60 min before your first and last meals of the day   For cough> use flutter valve as mucha as possible and add oxycodone  5mg   1-2 every 4 hours if needed but goal is not needing at all (for cough)  For breathing> albuterol up to every 4 hours if needed but goal is not needing at all   GERD (REFLUX)  is an extremely common cause of respiratory symptoms, many times with no significant heartburn at all.    It can be treated with medication, but also with lifestyle changes including avoidance of late meals, excessive alcohol, smoking cessation, and avoid fatty foods, chocolate, peppermint, colas, red wine, and acidic juices such as orange juice.  NO MINT OR MENTHOL PRODUCTS SO NO COUGH DROPS  USE SUGARLESS CANDY INSTEAD (jolley ranchers or Stover's)  NO OIL BASED VITAMINS - use powdered substitutes.   Please remember to go to the  x-ray department downstairs for your tests - we will call you with the results when they are available.  Keep follow up appt to see Dr [name] or in 2 weeks see our NP Tammy

## 2014-05-08 NOTE — Telephone Encounter (Signed)
moved from 7/27 to 7/22 w/KC - s/w pt she is aware

## 2014-05-08 NOTE — Progress Notes (Signed)
Subjective:    Patient ID: [name], female    DOB: September 10, 1941   MRN: [id number]  HPI  PCP - Linna Darner   73/F, ex smoker, realtor with Gold C COPD , on nocturnal O2 since 10/10 for FU.  12/2009 FEV1 1.0 L (48%)  She smoked for 40 years and quit in 1999. Advair worked better than symbicort.  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  Cardiac cath 08/31/12 with severe stenosis proximal LADs/p drug eluting stent proximal LAD  She went to [facility] for a second opinion - rehab & Theophylline advised    09/2013 Underwent surgery Left breast for breast cancer  Developed post op infectious complications   10/4429 hospitalization for a severe flare of COPD  She was initially admitted to [facility] ICU and required intubation and mechanical ventilation for several days (Flu + MSSA pneumonia) [facility]. Then to a nursing facility for the last 3 weeks. ([facility]).  Breo was not helping. She was given duo neb 4 times a day.  >>> was on pulmicort/ performist X 2 months - has supply of advair at home  Stopped lasix , c/o pedal edema  Now on 24h o2  Getting HHPT  She has questions about timing of right shoulder surgery ('bone on bone') & breast surgery  03/20/2014  transferred to [facility] s/p hospitalization at [facility] from 4/8 to 02/06/14 for acute on chronic resp failure requiring mechanical ventilation in due to AECOPD in setting of MSSA purulent bronchitis  She requested DNI status as well as DNR status. Severe esophageal dysmotility was noted on swallow eval >> improved Now at [facility] Per phone note -she had a cxr done 5/28. Told that she has PNA and CY called in augmentin for  her. Pt stated that she is on pred 20 mg daily. She stated that RA knows how fast she gets into trouble when she gets sick. Pt stated that her daughter is a Marine scientist and told her that the augmentin is not strong enough for her>> changed to levaquin + prednisone Review of medications shows Pulmicort, brovana, Spiriva, and Anoro She is on Lasix 40 twice a day rec No change rx   05/08/2014 acute extended   ov/Rodrigo Mcgranahan re: acute on chronic cough/ on advair and spiriva "at noon" "I'm never going back to hosp" Chief Complaint  Patient presents with  . Acute Visit    Pt c/o cough since Jan 2015, worse x 5 days. Cough is non prod and esp worse when she lies down.   using neb twice daily x months for sob with minimal activity  but then July 16 acutely much worse assoc with resting sob, increased neb saba up to to 3 x daily.  No purulent sputum. Not really clear she's taking meds as listed. Says advair better than brovana/bud neb   No obvious patterns in day to day or daytime variabilty or assoc c cp or chest tightness, subjective wheeze overt sinus or hb symptoms. No unusual exp hx or h/o childhood pna/ asthma or knowledge of premature birth.  Sleeping ok without nocturnal  or early am exacerbation  of respiratory  c/o's or need for noct saba. Also  denies any obvious fluctuation of symptoms with weather or environmental changes or other aggravating or alleviating factors except as outlined above   Current Medications, Allergies, Complete Past Medical History, Past Surgical History, Family History, and Social History were reviewed in [employer] record.  ROS  The following are not active complaints unless bolded sore throat, dysphagia, dental problems, itching, sneezing,  nasal congestion or excess/ purulent secretions, ear ache,   fever, chills, sweats, unintended wt loss, pleuritic or exertional cp, hemoptysis,  orthopnea pnd or leg swelling, presyncope, palpitations, heartburn, abdominal  pain, anorexia, nausea, vomiting, diarrhea  or change in bowel or urinary habits, change in stools or urine, dysuria,hematuria,  rash, arthralgias, visual complaints, headache, numbness weakness or ataxia or problems with walking or coordination,  change in mood/affect or memory.                Past Medical History  Diagnosis Date  . Other emphysema   . Unspecified arthropathy, shoulder region   . Gastric ulcer, unspecified as acute or chronic, without mention of hemorrhage, perforation, or obstruction   . Benign paroxysmal positional vertigo   . COPD (chronic obstructive pulmonary disease)   . Diverticula of colon   . Osteopenia   . Personal history of colonic polyps 2007    tubular adenoma high grade dysplasia  . Diverticulitis   . Hyperlipemia   . Fracture of ankle, bimalleolar, right, closed   . Hypoxemia   . C. difficile diarrhea   . Varicose vein   . Asthma   . GERD (gastroesophageal reflux disease)   . Hepatitis 1989  . Arthritis     "both shoulders"   . TIA (transient ischemic attack) 04/2008; 08/21/2012    "slight droop left lower lip after 08/21/12 TIA"  . On home oxygen therapy   . Ischemic cardiomyopathy     a. EF 45-50% 2011. b. EF 35-40% by cath 09/01/12.  . Night terrors, adult     dr Leonie Man referred  for PSG in 2011, and again in 2013   . Breast cancer 04/08/13 bx    left  . Allergy   . CAD (coronary artery disease)     a. ant MI 64 tx with TPA/PTCA. b. Prior stenting procedures including RCA stent, DES to LCx 04/2011. c. [place] -> DES  to prox LAD 08/2012.   Marland Kitchen Hypertension     TAKES FOR H/O STROKE  . CVA (cerebral vascular accident)   . Stroke, acute, embolic     Nov 0211 , [place]  [facility] , was treated with TPA.   . 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 Path G1 IDC, 1.6 cm, neg margin, neg SLN            Objective:   Physical  Exam  Wt Readings from Last 3 Encounters:  05/08/14 164 lb (74.39 kg)  04/13/14 164 lb 12.8 oz (74.753 kg)  03/20/14 172 lb 3.2 oz (78.109 kg)      Gen. Cankerous, argumentative "are you sure you know what you're doing" , chronically ill, in no distress, anxious affect, in a wheelchair/ congested/ rattling cough  ENT - no lesions, no post nasal drip, no thrush Neck: No JVD, no thyromegaly, no carotid bruits Lungs: no use of accessory muscles, no dullness to percussion, decreased without rales or rhonchi - no wheezing  Cardiovascular: Rhythm regular,  heart sounds  normal, no murmurs or gallops, 1+ peripheral edema Abdomen: soft and non-tender, no hepatosplenomegaly, BS normal. Musculoskeletal: No deformities, no cyanosis or clubbing Neuro:  alert, no focal weakness Psych Patient failed to answer a single question asked in a straightforward manner, tending to go off on tangents or answer questions with ambiguous medical terms or diagnoses and seemed aggravated  when asked the same question more than once for clarification.    CXR  05/08/2014 :  No active cardiopulmonary disease.     Assessment & Plan:

## 2014-05-08 NOTE — Assessment & Plan Note (Signed)
HCO3 33-38 range 02/2014 c/w well compensated hypercarbia  Adequate control on present rx, reviewed > no change in rx needed      Each maintenance medication was reviewed in detail including most importantly the difference between maintenance and as needed and under what circumstances the prns are to be used.  Please see instructions for details which were reviewed in writing and the patient given a copy.

## 2014-05-08 NOTE — Assessment & Plan Note (Signed)
Symptoms are markedly disproportionate to objective findings and not clear this is a lung problem but pt does appear to have difficult airway management issues. DDX of  difficult airways management all start with A and  include Adherence, Ace Inhibitors, Acid Reflux, Active Sinus Disease, Alpha 1 Antitripsin deficiency, Anxiety masquerading as Airways dz,  ABPA,  allergy(esp in young), Aspiration (esp in elderly), Adverse effects of DPI,  Active smokers, plus two Bs  = Bronchiectasis and Beta blocker use..and one C= CHF  Adherence is always the initial "prime suspect" and is a multilayered concern that requires a "trust but verify" approach in every patient - starting with knowing how to use medications, especially inhalers, correctly, keeping up with refills and understanding the fundamental difference between maintenance and prns vs those medications only taken for a very short course and then stopped and not refilled.  - needs to use spiriva each am along with am advair  ? Acid (or non-acid) GERD > always difficult to exclude as up to 75% of pts in some series report no assoc GI/ Heartburn symptoms> rec max (24h)  acid suppression and diet restrictions/ reviewed and instructions given in writing.   ? Adverse effect of dpi > on two dpi's with chronic cough and no active wheeze > defer change in maint rx to Dr [name] if not better  ? Allergy > Prednisone 10 mg take  4 each am x 2 days,   2 each am x 2 days,  1 each am x 2 days and stop  ? Active sinus / bronchitis > doxy x 7-10 d (already has rx at home)

## 2014-05-09 ENCOUNTER — Telehealth: Payer: Self-pay

## 2014-05-09 NOTE — Telephone Encounter (Signed)
Per APP request, spoke with patient to move appt to earlier in the day.  Patient said she will try to get here by 12.  Appt moved to 1215 lab and [place].

## 2014-05-10 ENCOUNTER — Other Ambulatory Visit (HOSPITAL_BASED_OUTPATIENT_CLINIC_OR_DEPARTMENT_OTHER): Payer: Medicare Other

## 2014-05-10 ENCOUNTER — Telehealth: Payer: Self-pay | Admitting: Oncology

## 2014-05-10 ENCOUNTER — Ambulatory Visit: Payer: Medicare Other | Admitting: Oncology

## 2014-05-10 ENCOUNTER — Ambulatory Visit (HOSPITAL_BASED_OUTPATIENT_CLINIC_OR_DEPARTMENT_OTHER): Payer: Medicare Other | Admitting: Oncology

## 2014-05-10 ENCOUNTER — Encounter: Payer: Self-pay | Admitting: Oncology

## 2014-05-10 ENCOUNTER — Other Ambulatory Visit: Payer: Medicare Other

## 2014-05-10 ENCOUNTER — Ambulatory Visit: Payer: Medicare Other | Admitting: Pulmonary Disease

## 2014-05-10 VITALS — BP 116/42 | HR 95 | Temp 97.7°F | Resp 20 | Ht 64.0 in | Wt 164.9 lb

## 2014-05-10 DIAGNOSIS — Z170 Estrogen receptor positive status [ER+]: Secondary | ICD-10-CM

## 2014-05-10 DIAGNOSIS — C50312 Malignant neoplasm of lower-inner quadrant of left female breast: Secondary | ICD-10-CM

## 2014-05-10 DIAGNOSIS — R5381 Other malaise: Secondary | ICD-10-CM

## 2014-05-10 DIAGNOSIS — B379 Candidiasis, unspecified: Secondary | ICD-10-CM

## 2014-05-10 DIAGNOSIS — C50319 Malignant neoplasm of lower-inner quadrant of unspecified female breast: Secondary | ICD-10-CM

## 2014-05-10 DIAGNOSIS — R05 Cough: Secondary | ICD-10-CM

## 2014-05-10 DIAGNOSIS — R5383 Other fatigue: Secondary | ICD-10-CM

## 2014-05-10 DIAGNOSIS — M899 Disorder of bone, unspecified: Secondary | ICD-10-CM

## 2014-05-10 DIAGNOSIS — E876 Hypokalemia: Secondary | ICD-10-CM

## 2014-05-10 DIAGNOSIS — N649 Disorder of breast, unspecified: Secondary | ICD-10-CM

## 2014-05-10 DIAGNOSIS — M25519 Pain in unspecified shoulder: Secondary | ICD-10-CM

## 2014-05-10 DIAGNOSIS — M949 Disorder of cartilage, unspecified: Secondary | ICD-10-CM

## 2014-05-10 DIAGNOSIS — R059 Cough, unspecified: Secondary | ICD-10-CM

## 2014-05-10 LAB — CBC WITH DIFFERENTIAL/PLATELET
BASO%: 0.7 % (ref 0.0–2.0)
Basophils Absolute: 0.1 10*3/uL (ref 0.0–0.1)
EOS%: 3.6 % (ref 0.0–7.0)
Eosinophils Absolute: 0.3 10*3/uL (ref 0.0–0.5)
HEMATOCRIT: 33.3 % — AB (ref 34.8–46.6)
HGB: 10.6 g/dL — ABNORMAL LOW (ref 11.6–15.9)
LYMPH%: 24.6 % (ref 14.0–49.7)
MCH: 26.5 pg (ref 25.1–34.0)
MCHC: 31.8 g/dL (ref 31.5–36.0)
MCV: 83.2 fL (ref 79.5–101.0)
MONO#: 0.6 10*3/uL (ref 0.1–0.9)
MONO%: 6.9 % (ref 0.0–14.0)
NEUT#: 5.5 10*3/uL (ref 1.5–6.5)
NEUT%: 64.2 % (ref 38.4–76.8)
Platelets: 268 10*3/uL (ref 145–400)
RBC: 4 10*6/uL (ref 3.70–5.45)
RDW: 17 % — ABNORMAL HIGH (ref 11.2–14.5)
WBC: 8.6 10*3/uL (ref 3.9–10.3)
lymph#: 2.1 10*3/uL (ref 0.9–3.3)

## 2014-05-10 LAB — COMPREHENSIVE METABOLIC PANEL (CC13)
ALT: 20 U/L (ref 0–55)
ANION GAP: 10 meq/L (ref 3–11)
AST: 18 U/L (ref 5–34)
Albumin: 3.1 g/dL — ABNORMAL LOW (ref 3.5–5.0)
Alkaline Phosphatase: 80 U/L (ref 40–150)
BUN: 18.6 mg/dL (ref 7.0–26.0)
CALCIUM: 9.3 mg/dL (ref 8.4–10.4)
CO2: 29 meq/L (ref 22–29)
CREATININE: 1.1 mg/dL (ref 0.6–1.1)
Chloride: 104 mEq/L (ref 98–109)
Glucose: 151 mg/dl — ABNORMAL HIGH (ref 70–140)
Potassium: 2.9 mEq/L — CL (ref 3.5–5.1)
Sodium: 143 mEq/L (ref 136–145)
Total Bilirubin: 0.47 mg/dL (ref 0.20–1.20)
Total Protein: 6.4 g/dL (ref 6.4–8.3)

## 2014-05-10 MED ORDER — ANASTROZOLE 1 MG PO TABS: 1.0000 mg | ORAL_TABLET | Freq: Every day | ORAL | Status: AC

## 2014-05-10 NOTE — Telephone Encounter (Signed)
Spk wNoelia schd MM/BD for 08/11 @1 :30, lft msg for pt concerning this matter, mailed out updated schedule...KJ

## 2014-05-10 NOTE — Progress Notes (Signed)
ID: [name] OB: 07/19/1941  MR#: [id number]  OYD#:[id number]  PCP: Unice Cobble, MD GYN:  Molli Posey SU: Star Age OTHER MD: Royal Hawthorn, [place], [place]   HISTORY OF PRESENT ILLNESS: [name] had routine screening mammography 03/22/2013 showing a possible mass in the left breast. Left diagnostic mammography 04/07/2013 confirmed an irregular mass in the lower inner quadrant of the left breast, associated with her sub-glandular implant. Biopsy of this mass 04/08/2013 showed ([id number]) an invasive ductal carcinoma, grade 1, estrogen receptor 100% positive, progesterone receptor 86% positive, with an MIB-1 of 7% and no HER-2 amplification.  Bilateral breast MRIs 04/17/2013 showed no abnormal lymphadenopathy. The mass in question measured 1.7 cm by MRI.  Her subsequent history is as detailed below  INTERVAL HISTORY: [name] returns today for followup of her breast cancer. Her visit with [place] has been delayed due to several hospitalizations. Most recently in April 2015 for acute respiratory failure.    REVIEW OF SYSTEMS: [name] is tolerating her anastrozole well. She has not noticed any increase in her arthralgias. Denies hot flashes. Denies vaginal dryness. She has not noticed any lumps or bumps in her breast. She reports a yeast infection under her breast is currently using nystatin powder. She is moderately fatigued. She has an aide that helps her with her ADLs. She was seen by pulmonology earlier this week due to cough. She is now on doxycycline and prednisone. The patient is O2 dependent. She thinks that her cough is getting better. Denies fevers. She is having more pain in the right shoulder. She is hoping to have surgery in January 2016.Marland Kitchen Otherwise she is doing well and a detailed review of systems was noncontributory except as noted.  PAST MEDICAL HISTORY: Past Medical History  Diagnosis Date  . Other emphysema   . Unspecified arthropathy, shoulder region   .  Gastric ulcer, unspecified as acute or chronic, without mention of hemorrhage, perforation, or obstruction   . Benign paroxysmal positional vertigo   . COPD (chronic obstructive pulmonary disease)   . Diverticula of colon   . Osteopenia   . Personal history of colonic polyps 2007    tubular adenoma high grade dysplasia  . Diverticulitis   . Hyperlipemia   . Fracture of ankle, bimalleolar, right, closed   . Hypoxemia   . C. difficile diarrhea   . Varicose vein   . Asthma   . GERD (gastroesophageal reflux disease)   . Hepatitis 1989  . Arthritis     "both shoulders"   . TIA (transient ischemic attack) 04/2008; 08/21/2012    "slight droop left lower lip after 08/21/12 TIA"  . On home oxygen therapy   . Ischemic cardiomyopathy     a. EF 45-50% 2011. b. EF 35-40% by cath 09/01/12.  . Night terrors, adult     dr Leonie Man referred  for PSG in 2011, and again in 2013   . Breast cancer 04/08/13 bx    left  . Allergy   . CAD (coronary artery disease)     a. ant MI 41 tx with TPA/PTCA. b. Prior stenting procedures including RCA stent, DES to LCx 04/2011. c. [place] -> DES  to prox LAD 08/2012.   Marland Kitchen Hypertension     TAKES FOR H/O STROKE  . CVA (cerebral vascular accident)   . Stroke, acute, embolic     Nov 0962 , [place]  [facility] , was treated with TPA.   . Breast cancer, left, LIQ, Stage  1, receptor+, her2 neg 04/14/2013    June 2014 invasive ductal cancer, rx lumpectomy, removal of implant and capsuel, SLN on 81414. Path G1 IDC, 1.6 cm, neg margin, neg SLN     PAST SURGICAL HISTORY: Past Surgical History  Procedure Laterality Date  . Total shoulder arthroplasty  2012    left  . Orif ankle fracture  2007    right  . Martial megeti  2841'L    "bladder operation; not successful " (08/31/2012)  . Varicose vein surgery  1983; 1985    "both legs both times" (08/31/2012)  . Total shoulder replacement  12/05/2010    Dr Onnie Graham  . Abdominal hysterectomy      TAH w/BSO  . Augmentation  mammaplasty  1975  . Coronary angioplasty with stent placement  2010  . Coronary angioplasty  1990    "3 times" (08/31/2012)  . Colon surgery  07/07/2011    Dr. Hassell Done  . Appendectomy  1960  . Nose surgery      1977  . Breast augmenttation    . Catarct  right      RIGHT EYE  . Breast lumpectomy with needle localization and axillary sentinel lymph node bx Left 06/02/2013    Procedure: BREAST LUMPECTOMY WITH NEEDLE LOCALIZATION AND AXILLARY SENTINEL LYMPH NODE BX;  Surgeon: Haywood Lasso, MD;  Location: [facility];  Service: General;  Laterality: Left;  NEEDLE LOC BCG 7:30 NUC MED 9:30 RECONSTRUCTION TO FOLLOW 1 HOUR added   . Breast implant removal Left 06/02/2013    Procedure: REMOVAL BREAST IMPLANTS; LEFT BREAST CAPSULECTOMY WITH PLACEMENT OF TISSUE EXPANDER AND USE OF FLEX HD;  Surgeon: Crissie Reese, MD;  Location: [facility];  Service: Plastics;  Laterality: Left;    FAMILY HISTORY Family History  Problem Relation Age of Onset  . Breast cancer Sister     x 2  . Colon cancer Neg Hx   . Diabetes Paternal Grandmother   . Uterine cancer Paternal Grandmother   . Lung cancer Paternal Grandmother   . Heart disease Mother   . Alcohol abuse Mother   . Heart disease Father   . Pancreatic cancer Cousin   . Colon polyps Sister   . Diabetes Maternal Uncle   . Heart disease Maternal Grandfather   . Heart disease Paternal Grandfather   . Stroke Paternal Grandfather    the patient's father died at age 68 from heart disease. The patient's mother died at 76 from heart disease and COPD. The patient had no brothers, 3 sisters. Sister and has no history of cancer. Sister [name] was diagnosed with breast cancer at the age of 56. She is doing well. Sister [name] was diagnosed with breast cancer at the age of 65. She has had a more rocky course, but survives. There is no history of ovarian cancer in the family  GYNECOLOGIC HISTORY:  Menarche age 69, first live birth age 64, the patient is [place] P3 she had  a total abdominal hysterectomy with bilateral salpingo-oophorectomy at the age of 47. She took hormone replacement approximately 5 years. She also took birth control remotely, with no complications.  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 She is divorced and lives by herself, with no pets.    ADVANCED DIRECTIVES: In place   HEALTH MAINTENANCE: History  Substance Use Topics  . Smoking status: Former Smoker -- 1.00 packs/day for 40 years    Types: Cigarettes    Quit date: 10/20/1997  .  Smokeless tobacco: Never Used  . Alcohol Use: 1.8 oz/week    3 Glasses of wine per week     Comment: occasionally     Colonoscopy: [facility]  PAP:  Bone density:  Lipid panel:  Allergies  Allergen Reactions  . Escitalopram Oxalate Shortness Of Breath, Diarrhea and Nausea And Vomiting    Hot flashes, dizziness  . Indomethacin Other (See Comments)    "made me very very dizzy and made me have vertigo" (08/31/2012)  . Nsaids     Jerking, hot flashes, nausea  . Codeine Nausea And Vomiting    "I can take codeine in my cough medicine" (08/31/2012)  . Sulfonamide Derivatives Other (See Comments)    Drug induced hepatitis in 1989    Current Outpatient Prescriptions  Medication Sig Dispense Refill  . albuterol (PROVENTIL) (2.5 MG/3ML) 0.083% nebulizer solution Take 2.5 mg by nebulization every 6 (six) hours as needed for wheezing or shortness of breath.      . anastrozole (ARIMIDEX) 1 MG tablet Take 1 tablet (1 mg total) by mouth daily.  90 tablet  3  . aspirin EC 81 MG tablet Take 81 mg by mouth daily.      . busPIRone (BUSPAR) 15 MG tablet Take 1 tablet (15 mg total) by mouth 2 (two) times daily.  180 tablet  3  . clonazePAM (KLONOPIN) 1 MG tablet Take 1 tablet (1 mg total) by mouth every 12 (twelve) hours.  30 tablet  0  . clopidogrel (PLAVIX) 75 MG tablet TAKE 1 TABLET EVERY DAY  30 tablet  0  . Dentifrices (BIOTENE DRY MOUTH) GEL Place onto teeth 2 (two)  times daily.      Marland Kitchen docusate sodium 100 MG CAPS Take 100 mg by mouth daily.  10 capsule  0  . Doxycycline Hyclate 20 MG CAPS Take 20 mg by mouth 2 (two) times daily at 10 AM and 5 PM.      . feeding supplement, ENSURE COMPLETE, (ENSURE COMPLETE) LIQD Take 237 mLs by mouth 2 (two) times daily between meals.      . Fluticasone-Salmeterol (ADVAIR) 250-50 MCG/DOSE AEPB Inhale 1 puff into the lungs 2 (two) times daily.      . furosemide (LASIX) 40 MG tablet Take 1 tablet (40 mg total) by mouth 2 (two) times daily.  180 tablet  3  . gabapentin (NEURONTIN) 300 MG capsule Take 300 mg by mouth at bedtime.      . lidocaine (LIDODERM) 5 % Place 1 patch onto the skin at bedtime. Remove & Discard patch within 12 hours or as directed by MD  90 patch  0  . loratadine (CLARITIN) 10 MG tablet Take 10 mg by mouth daily.      Marland Kitchen Lysine 500 MG TABS Take by mouth daily.      . metoprolol tartrate (LOPRESSOR) 25 MG tablet Take 12.5 mg by mouth daily.      . Multiple Vitamins-Minerals (THERA-TABS M PO) Take by mouth daily.      Marland Kitchen oxybutynin (DITROPAN) 5 MG tablet Take 5 mg by mouth at bedtime.       Marland Kitchen oxyCODONE (OXY IR/ROXICODONE) 5 MG immediate release tablet Take 1 tablet (5 mg total) by mouth every 4 (four) hours as needed for severe pain (ok to take for ocugh also).  40 tablet  0  . pantoprazole (PROTONIX) 40 MG tablet Take 1 tablet (40 mg total) by mouth 2 (two) times daily.  60 tablet  0  .  polyethylene glycol (MIRALAX / GLYCOLAX) packet Take 17 g by mouth daily as needed.  14 each  0  . potassium chloride SA (K-DUR,KLOR-CON) 20 MEQ tablet Take 20 mEq by mouth 2 (two) times daily.       . predniSONE (DELTASONE) 10 MG tablet Take  4 each am x 2 days,   2 each am x 2 days,  1 each am x 2 days and stop  14 tablet  0  . simvastatin (ZOCOR) 40 MG tablet TAKE 1 TABLET AT BEDTIME  30 tablet  00  . sucralfate (CARAFATE) 1 G tablet Take 1 g by mouth 4 (four) times daily.      Marland Kitchen tiotropium (SPIRIVA) 18 MCG inhalation  capsule Place 18 mcg into inhaler and inhale daily.       No current facility-administered medications for this visit.    OBJECTIVE: Middle-aged white woman with a postoperative drain still in place in the left chest wall Filed Vitals:   05/10/14 1251  BP: 116/42  Pulse: 95  Temp: 97.7 F (36.5 C)  Resp: 20     Body mass index is 28.29 kg/(m^2).    ECOG FS: 2  Sclerae unicteric, pupils equal round and reactive, dentition is fair,Oropharynx clear No cervical or supraclavicular adenopathy Lungs no rales or rhonchi, fair excursion bilaterally, Heart regular rate and rhythm, no murmur appreciated Abd soft, nontender, positive bowel sounds MSK no focal spinal tenderness, no peripheral edema Neuro: non-focal, well-oriented, positive affect Breasts: The right breast is unremarkable. The left breast is status post lumpectomy. The lumpectomy and axillary sentinel lymph node sampling scars are healing very nicely. She has candidiasis under her left breast.  LAB RESULTS:  CMP     Component Value Date/Time   NA 143 05/10/2014 1216   NA 143 02/27/2014 0500   K 2.9* 05/10/2014 1216   K 3.3* 02/27/2014 0500   CL 98 02/27/2014 0500   CO2 29 05/10/2014 1216   CO2 33* 02/27/2014 0500   GLUCOSE 151* 05/10/2014 1216   GLUCOSE 92 02/27/2014 0500   GLUCOSE 101* 08/04/2006 1026   BUN 18.6 05/10/2014 1216   BUN 20 02/27/2014 0500   CREATININE 1.1 05/10/2014 1216   CREATININE 0.70 02/27/2014 0500   CREATININE 0.85 07/23/2011 1635   CALCIUM 9.3 05/10/2014 1216   CALCIUM 9.3 02/27/2014 0500   PROT 6.4 05/10/2014 1216   PROT 5.8* 02/20/2014 0500   ALBUMIN 3.1* 05/10/2014 1216   ALBUMIN 2.8* 02/20/2014 0500   AST 18 05/10/2014 1216   AST 28 02/20/2014 0500   ALT 20 05/10/2014 1216   ALT 39* 02/20/2014 0500   ALKPHOS 80 05/10/2014 1216   ALKPHOS 77 02/20/2014 0500   BILITOT 0.47 05/10/2014 1216   BILITOT 0.3 02/20/2014 0500   GFRNONAA 84* 02/27/2014 0500   GFRAA >90 02/27/2014 0500    I No results found for this  basename: SPEP,  UPEP,   kappa and lambda light chains    Lab Results  Component Value Date   WBC 8.6 05/10/2014   NEUTROABS 5.5 05/10/2014   HGB 10.6* 05/10/2014   HCT 33.3* 05/10/2014   MCV 83.2 05/10/2014   PLT 268 05/10/2014      Chemistry      Component Value Date/Time   NA 143 05/10/2014 1216   NA 143 02/27/2014 0500   K 2.9* 05/10/2014 1216   K 3.3* 02/27/2014 0500   CL 98 02/27/2014 0500   CO2 29 05/10/2014 1216   CO2 33* 02/27/2014  0500   BUN 18.6 05/10/2014 1216   BUN 20 02/27/2014 0500   CREATININE 1.1 05/10/2014 1216   CREATININE 0.70 02/27/2014 0500   CREATININE 0.85 07/23/2011 1635      Component Value Date/Time   CALCIUM 9.3 05/10/2014 1216   CALCIUM 9.3 02/27/2014 0500   ALKPHOS 80 05/10/2014 1216   ALKPHOS 77 02/20/2014 0500   AST 18 05/10/2014 1216   AST 28 02/20/2014 0500   ALT 20 05/10/2014 1216   ALT 39* 02/20/2014 0500   BILITOT 0.47 05/10/2014 1216   BILITOT 0.3 02/20/2014 0500       No results found for this basename: LABCA2    No components found with this basename: LABCA125    No results found for this basename: INR,  in the last 168 hours  Urinalysis    Component Value Date/Time   COLORURINE YELLOW 10/03/2013 [place] 10/03/2013 1531   LABSPEC 1.017 10/03/2013 1531   PHURINE 5.5 10/03/2013 1531   GLUCOSEU NEG 10/03/2013 1531   GLUCOSEU NEGATIVE 06/11/2011 1458   HGBUR NEG 10/03/2013 1531   BILIRUBINUR NEG 10/03/2013 1531   BILIRUBINUR Neg 02/05/2012 1602   KETONESUR NEG 10/03/2013 1531   PROTEINUR NEG 10/03/2013 1531   PROTEINUR Trace 02/05/2012 1602   UROBILINOGEN 0.2 10/03/2013 1531   UROBILINOGEN 0.2 02/05/2012 1602   NITRITE NEG 10/03/2013 1531   NITRITE Neg 02/05/2012 1602   LEUKOCYTESUR TRACE* 10/03/2013 1531    STUDIES: No results found.   ASSESSMENT: 73 y.o. [place] woman status post left breast biopsy 04/08/2013 for a clinical T1c N0, stage IA invasive ductal carcinoma, grade 1, estrogen and progesterone receptor  positive, with no HER-2 amplification, and an MIB-1 of 7%  (1) status post left lumpectomy and sentinel lymph node sampling age 31 2014 for a pT1c pN0, stage IA invasive ductal carcinoma, grade 1, with repeat HER-2 again negative  (2) Oncotype score of 6 predicts a distant disease recurrent rate of 5% within 10 years if the patient's only systemic treatment is tamoxifen for 5 years.  (3) genetics results pending  (4) started anastrozole August 2014  PLAN: Donia Guiles is tolerating the anastrozole without any significant side effects. The plan is going to be to continue that for 5 years. I sent a refill to her mail order pharmacy for 90 day supply with 3 additional refills.  She is overdue for her mammogram and is unsure what she had her last DEXA bone scan, but states it has been over 2 years. I have placed orders for both a mammogram and a DEXA bone scan to be done in the next few weeks.  She does have candidiasis under her left breast. I have recommended that she continue nystatin powder she is already doing. If this has not resolved in the next 7-10 days, she may use the antifungal cream that she has at home. She may also call us for a prescription medication if over-the-counter treatments are not working.  Her labs today demonstrates hypokalemia. The patient is to be taking K-Dur 20 milliequivalents twice a day. However, she states that she has not been taking this on a regular basis as directed. I have again stressed the importance of taking K-Dur 20 milliequivalents twice a day. Her potassium will be rechecked by her primary care provider the next few weeks.   I have made her a return appointment in 6 months. She knows to call for any problems that may develop before that.  Mikey Bussing, NP  05/10/2014 1:53 PM

## 2014-05-15 ENCOUNTER — Other Ambulatory Visit: Payer: Medicare Other

## 2014-05-15 ENCOUNTER — Ambulatory Visit: Payer: Medicare Other | Admitting: Nurse Practitioner

## 2014-05-16 ENCOUNTER — Telehealth: Payer: Self-pay | Admitting: Pulmonary Disease

## 2014-05-16 DIAGNOSIS — R627 Adult failure to thrive: Secondary | ICD-10-CM

## 2014-05-16 DIAGNOSIS — R5381 Other malaise: Secondary | ICD-10-CM

## 2014-05-16 DIAGNOSIS — I2589 Other forms of chronic ischemic heart disease: Secondary | ICD-10-CM

## 2014-05-16 DIAGNOSIS — J9621 Acute and chronic respiratory failure with hypoxia: Secondary | ICD-10-CM

## 2014-05-16 NOTE — Telephone Encounter (Signed)
Pl refer to PCP - I have forwarded to  Dr Linna Darner

## 2014-05-16 NOTE — Telephone Encounter (Signed)
lmomtcb x1 for debbie to make aware

## 2014-05-16 NOTE — Telephone Encounter (Signed)
Please advise Dr. [name] thanks thanks

## 2014-05-16 NOTE — Telephone Encounter (Signed)
Can you put in order to continue [facility] please? Thanks, [employer]

## 2014-05-16 NOTE — Telephone Encounter (Signed)
Spoke with [name] with home health and gave Dr Bari Mantis recommendations.  She verbalized understanding and will contact Dr Linna Darner.

## 2014-05-17 ENCOUNTER — Encounter: Payer: Self-pay | Admitting: Internal Medicine

## 2014-05-17 DIAGNOSIS — R627 Adult failure to thrive: Secondary | ICD-10-CM | POA: Insufficient documentation

## 2014-05-17 NOTE — Telephone Encounter (Signed)
Called [name] no answer LMOM md ok to continue [facility]. Will faxed order to fax # that was given,,,/lmb

## 2014-05-17 NOTE — Telephone Encounter (Signed)
I need diagnoses.Marland KitchenJohny Chess

## 2014-05-17 NOTE — Addendum Note (Signed)
Addended by: Earnstine Regal on: 05/17/2014 09:40 AM   Modules accepted: Orders

## 2014-05-17 NOTE — Telephone Encounter (Signed)
   Adult failure to thrive; ischemic cardiomyopathy; physical deconditioning (799.3) and chronic respiratory insufficiency.  Thanks for doing this; [employer]

## 2014-05-25 ENCOUNTER — Ambulatory Visit: Payer: Medicare Other | Admitting: Pulmonary Disease

## 2014-05-25 ENCOUNTER — Encounter: Payer: Self-pay | Admitting: Pulmonary Disease

## 2014-05-25 VITALS — BP 104/60 | HR 92 | Temp 98.3°F | Ht 64.0 in | Wt 159.0 lb

## 2014-05-25 DIAGNOSIS — J449 Chronic obstructive pulmonary disease, unspecified: Secondary | ICD-10-CM

## 2014-05-25 DIAGNOSIS — I2589 Other forms of chronic ischemic heart disease: Secondary | ICD-10-CM

## 2014-05-25 MED ORDER — POTASSIUM CHLORIDE CRYS ER 20 MEQ PO TBCR: 20.0000 meq | EXTENDED_RELEASE_TABLET | Freq: Every day | ORAL | Status: DC

## 2014-05-25 NOTE — Progress Notes (Signed)
   Subjective:    Patient ID: [name], female    DOB: 1941/04/26, 73 y.o.   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  12/2009 FEV1 1.0 L (48%)  She smoked for 40 years and quit in 1999. Advair worked better than symbicort.  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  Cardiac cath 08/31/12 with severe stenosis proximal LADs/p drug eluting stent proximal LAD  09/2013 Underwent surgery Left breast for breast cancer  Developed post op infectious complications  02/353 severe flare of COPD >>mechanical ventilation for several days (Flu + MSSA pneumonia) >>[facility]. Placed on 24h o2     01/2014 >> mechanical ventilation due to AECOPD in setting of MSSA purulent bronchitis >>  [place] >> son's home She requested DNI status as well as DNR status.    05/25/2014  Chief Complaint  Patient presents with  . Follow-up    still needs some medication for the cough, coug is non productive but sounds very deep   She required mechanical ventilation twice this year and has been on 24-hour oxygen since January 2015, she now lives with her son and obtains [place] home care- [facility] She is keen on going to her own home in September and getting back to drive.  Says advair better than brovana/bud neb , back to Advair and Spiriva regimen. Takes Lasix 40 mg twicedaily , urinating a lot,was found to  be hypokalemic She wants a prescription sent to [place] 2.9 7/22    Review of Systems neg for any significant sore throat, dysphagia, itching, sneezing, nasal congestion or excess/ purulent secretions, fever, chills,  sweats, unintended wt loss, pleuritic or exertional cp, hempoptysis, orthopnea pnd or change in chronic leg swelling. Also denies presyncope, palpitations, heartburn, abdominal pain, nausea, vomiting, diarrhea or change in bowel or urinary habits, dysuria,hematuria, rash, arthralgias, visual complaints, headache, numbness weakness or ataxia.     Objective:   Physical Exam  Gen. Pleasant, well-nourished, chronically  Ill,in no distress, anxious affect ENT - no lesions, no post nasal drip Neck: No JVD, no thyromegaly, no carotid bruits Lungs: no use of accessory muscles, no dullness to percussion, decreased without rales or rhonchi  Cardiovascular: Rhythm regular, heart sounds  normal, no murmurs or gallops, no peripheral edema Abdomen: soft and non-tender, no hepatosplenomegaly, BS normal. Musculoskeletal: No deformities, no cyanosis or clubbing Neuro:  alert, non focal       Assessment & Plan:

## 2014-05-25 NOTE — Patient Instructions (Signed)
Decrease lasix & potassium to once daily Potassium Rx x 3 months x 3 re fills Take mucinex twice daily for cough

## 2014-05-26 NOTE — Assessment & Plan Note (Signed)
Decrease lasix & potassium to once daily Potassium Rx x 3 months x 3 re fills

## 2014-05-26 NOTE — Assessment & Plan Note (Signed)
Take mucinex twice daily for cough We discussed long-term use of oxygen, she has been on 24-hour O2 since January, and only time will tell if she will improve enough to come off daytime oxygen-will check again on next visit. She wants me to weigh on her children, to let her go home and living independently. I explained to her that this would depend on her ability and the resources available at home. She is keen to start driving again and would want a letter from me by the end of this month, I think this would be reasonable if oxygen requirements stay stable.

## 2014-05-30 ENCOUNTER — Ambulatory Visit
Admission: RE | Admit: 2014-05-30 | Discharge: 2014-05-30 | Disposition: A | Discharge status: Home / Self Care | Payer: Medicare Other | Source: Ambulatory Visit | Attending: Oncology | Admitting: Oncology

## 2014-05-30 DIAGNOSIS — M949 Disorder of cartilage, unspecified: Principal | ICD-10-CM

## 2014-05-30 DIAGNOSIS — C50312 Malignant neoplasm of lower-inner quadrant of left female breast: Secondary | ICD-10-CM

## 2014-05-30 DIAGNOSIS — M899 Disorder of bone, unspecified: Secondary | ICD-10-CM

## 2014-06-01 ENCOUNTER — Encounter: Payer: Self-pay | Admitting: Internal Medicine

## 2014-06-01 DIAGNOSIS — M810 Age-related osteoporosis without current pathological fracture: Secondary | ICD-10-CM | POA: Insufficient documentation

## 2014-06-07 ENCOUNTER — Encounter: Payer: Medicare Other | Admitting: Internal Medicine

## 2014-06-12 ENCOUNTER — Telehealth: Payer: Self-pay | Admitting: Internal Medicine

## 2014-06-12 ENCOUNTER — Telehealth: Payer: Self-pay | Admitting: Pulmonary Disease

## 2014-06-12 MED ORDER — TIOTROPIUM BROMIDE MONOHYDRATE 18 MCG IN CAPS: 18.0000 ug | ORAL_CAPSULE | Freq: Every day | RESPIRATORY_TRACT | Status: AC

## 2014-06-12 MED ORDER — FLUTICASONE-SALMETEROL 250-50 MCG/DOSE IN AEPB: 1.0000 | INHALATION_SPRAY | Freq: Two times a day (BID) | RESPIRATORY_TRACT | Status: AC

## 2014-06-12 MED ORDER — FLUTICASONE-SALMETEROL 250-50 MCG/DOSE IN AEPB: 1.0000 | INHALATION_SPRAY | Freq: Two times a day (BID) | RESPIRATORY_TRACT | Status: DC

## 2014-06-12 NOTE — Telephone Encounter (Signed)
Pt states she needs samples on the below medication.  She is out.  She is waiting on [facility] to send the re-fills and she states it will arrive next week.  Pt did not want local pharmacy due to cost being expensive.   Fluticasone-Salmeterol (ADVAIR) 250-50 MCG/DOSE AEPB tiotropium (SPIRIVA) 18 MCG inhalation capsule

## 2014-06-12 NOTE — Telephone Encounter (Signed)
Samples for Spiriva and Advair 250-50 Pt states that she will contact our office back if she needs refills sent to [facility] Pt states that she is waiting on a call from [facility] to let her know if Rx on file is still good.  Nothing further needed.

## 2014-06-12 NOTE — Telephone Encounter (Signed)
Went out and spoke with pt and she stated that the mail order pharmacy has shipped her meds and they told her it would take up to 2 weeks to get her meds.  Pt needed 1 more sample of the advair. She is ware that we do not get the samples of the rescue inhaler anymore.  Nothing further is needed.

## 2014-06-12 NOTE — Telephone Encounter (Signed)
Patient has been advised that [facility] office does not have the samples she is requesting

## 2014-06-13 ENCOUNTER — Other Ambulatory Visit: Payer: Self-pay | Admitting: Neurology

## 2014-06-13 MED ORDER — GABAPENTIN 300 MG PO CAPS: 300.0000 mg | ORAL_CAPSULE | Freq: Every day | ORAL | Status: AC

## 2014-06-13 MED ORDER — CLONAZEPAM 0.5 MG PO TABS: 0.5000 mg | ORAL_TABLET | Freq: Every day | ORAL | Status: AC

## 2014-06-13 NOTE — Telephone Encounter (Signed)
Pt requesting Rx refill for clonazePAM (KLONOPIN) 1 MG tablet and Gabapentin.  Please send to [facility] for 90 day supply with 3 refills.  Please call anytime and leave detailed message if not available.  Patient leaving to go out of town Saturday for 3 weeks and will call [facility] to give mailing address in [place].

## 2014-06-13 NOTE — Telephone Encounter (Signed)
Request forwarded to provider for approval.  I spoke with patient who will call back to schedule annual appt later today after her therapist leaves.

## 2014-06-15 ENCOUNTER — Encounter: Payer: Self-pay | Admitting: Cardiovascular Disease

## 2014-06-15 ENCOUNTER — Ambulatory Visit (INDEPENDENT_AMBULATORY_CARE_PROVIDER_SITE_OTHER): Payer: Medicare Other | Admitting: Cardiovascular Disease

## 2014-06-15 VITALS — BP 110/80 | HR 84 | Ht 64.0 in | Wt 156.0 lb

## 2014-06-15 DIAGNOSIS — E785 Hyperlipidemia, unspecified: Secondary | ICD-10-CM

## 2014-06-15 DIAGNOSIS — I2589 Other forms of chronic ischemic heart disease: Secondary | ICD-10-CM

## 2014-06-15 DIAGNOSIS — I2510 Atherosclerotic heart disease of native coronary artery without angina pectoris: Secondary | ICD-10-CM

## 2014-06-15 DIAGNOSIS — I255 Ischemic cardiomyopathy: Secondary | ICD-10-CM

## 2014-06-15 DIAGNOSIS — I10 Essential (primary) hypertension: Secondary | ICD-10-CM

## 2014-06-15 MED ORDER — CLOPIDOGREL BISULFATE 75 MG PO TABS
ORAL_TABLET | ORAL | Status: DC
Start: 2014-06-15 — End: 2014-07-24

## 2014-06-15 MED ORDER — SIMVASTATIN 40 MG PO TABS
ORAL_TABLET | ORAL | Status: DC
Start: 2014-06-15 — End: 2014-07-27

## 2014-06-15 NOTE — Progress Notes (Signed)
History of Present Illness: 73 y.o. female with history of CAD, s/p post multiple prior stenting procedures, ischemic cardiomyopathy, severe COPD, prior strokes, HTN, HL, GERD, breast cancer here today for cardiac follow up. [name] suffered an anterior MI in 1990 treated with TPA followed by angioplasty. [name] then had a DES placed in the circumflex in 04/2009. [name] suffered a stroke in 2009 treated with TPA. [name] [place]. 2 weeks later, [name] was treated again with TPA for a stroke in the hospital in [place], [place]. [name] was discharged on Plavix. LHC 08/2012 done for chest pain: Proximal LAD 90%, diagonal 30%, circumflex stent patent, mid RCA 50%, EF 35-40%. PCI: Promus DES to proximal LAD. Echo 12/13: EF 40-45% apical hypo-to akinesis. Stress myoview July 2014 with large anterior wall scar but no ischemia. Left lumpectomy August 2014 with infection of spacer and repeat surgery. Admitted 11/08/13-11/18/13 with respiratory failure in setting of influenza and COPD flare. [name] was intubated and managed by PCCM. After extubation c/o CP but troponins negative. Echo with poor windows. EF "mild to moderately depressed". Cardiac cath 11/25/13 with stable disease. Admitted again in April 2015 with respiratory failure requiring mechanical ventilation. [name] is now DNR.   [name] is here today for follow up. [name] continues to occasional chest pains but mostly after meals but Maalox helps. Her breathing has been much better while using supplemental O2 daily. No syncope. No orthopnea, PND, edema.   Primary Care Physician: Dr. Linna Darner Pulmonary: Dr. [name]  Last Lipid Profile:Lipid Panel     Component Value Date/Time   CHOL 137 08/31/2013 1213   TRIG 151* 01/31/2014 0814   HDL 43.00 08/31/2013 1213   CHOLHDL 3 08/31/2013 1213   VLDL 14.0 08/31/2013 1213   [place] 80 08/31/2013 1213    Past Medical History  Diagnosis Date  . Other emphysema   . Unspecified arthropathy, shoulder  region   . Gastric ulcer, unspecified as acute or chronic, without mention of hemorrhage, perforation, or obstruction   . Benign paroxysmal positional vertigo   . COPD (chronic obstructive pulmonary disease)   . Diverticula of colon   . Osteopenia   . Personal history of colonic polyps 2007    tubular adenoma high grade dysplasia  . Diverticulitis   . Hyperlipemia   . Fracture of ankle, bimalleolar, right, closed   . Hypoxemia   . C. difficile diarrhea   . Varicose vein   . Asthma   . GERD (gastroesophageal reflux disease)   . Hepatitis 1989  . Arthritis     "both shoulders"   . TIA (transient ischemic attack) 04/2008; 08/21/2012    "slight droop left lower lip after 08/21/12 TIA"  . On home oxygen therapy   . Ischemic cardiomyopathy     a. EF 45-50% 2011. b. EF 35-40% by cath 09/01/12.  . Night terrors, adult     dr Leonie Man referred  for PSG in 2011, and again in 2013   . Breast cancer 04/08/13 bx    left  . Allergy   . CAD (coronary artery disease)     a. ant MI 71 tx with TPA/PTCA. b. Prior stenting procedures including RCA stent, DES to LCx 04/2011. c. [place] -> DES  to prox LAD 08/2012.   Marland Kitchen Hypertension     TAKES FOR H/O STROKE  . CVA (cerebral vascular accident)   . Stroke, acute, embolic     Nov 8676 , [place]  [facility] ,  was treated with TPA.   . 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 Path G1 IDC, 1.6 cm, neg margin, neg SLN     Past Surgical History  Procedure Laterality Date  . Total shoulder arthroplasty  2012    left  . Orif ankle fracture  2007    right  . Martial megeti  5885'O    "bladder operation; not successful " (08/31/2012)  . Varicose vein surgery  1983; 1985    "both legs both times" (08/31/2012)  . Total shoulder replacement  12/05/2010    Dr Onnie Graham  . Abdominal hysterectomy      TAH w/BSO  . Augmentation mammaplasty  1975  .  Coronary angioplasty with stent placement  2010  . Coronary angioplasty  1990    "3 times" (08/31/2012)  . Colon surgery  07/07/2011    Dr. Hassell Done  . Appendectomy  1960  . Nose surgery      1977  . Breast augmenttation    . Catarct  right      RIGHT EYE  . Breast lumpectomy with needle localization and axillary sentinel lymph node bx Left 06/02/2013    Procedure: BREAST LUMPECTOMY WITH NEEDLE LOCALIZATION AND AXILLARY SENTINEL LYMPH NODE BX;  Surgeon: Haywood Lasso, MD;  Location: [facility];  Service: General;  Laterality: Left;  NEEDLE LOC BCG 7:30 NUC MED 9:30 RECONSTRUCTION TO FOLLOW 1 HOUR added   . Breast implant removal Left 06/02/2013    Procedure: REMOVAL BREAST IMPLANTS; LEFT BREAST CAPSULECTOMY WITH PLACEMENT OF TISSUE EXPANDER AND USE OF FLEX HD;  Surgeon: Crissie Reese, MD;  Location: [facility];  Service: Plastics;  Laterality: Left;    Current Outpatient Prescriptions  Medication Sig Dispense Refill  . albuterol (PROVENTIL) (2.5 MG/3ML) 0.083% nebulizer solution Take 2.5 mg by nebulization every 6 (six) hours as needed for wheezing or shortness of breath.      . anastrozole (ARIMIDEX) 1 MG tablet Take 1 tablet (1 mg total) by mouth daily.  90 tablet  3  . aspirin EC 81 MG tablet Take 81 mg by mouth daily.      . busPIRone (BUSPAR) 15 MG tablet Take 1 tablet (15 mg total) by mouth 2 (two) times daily.  180 tablet  3  . clonazePAM (KLONOPIN) 0.5 MG tablet Take 1 tablet (0.5 mg total) by mouth at bedtime.  90 tablet  0  . clopidogrel (PLAVIX) 75 MG tablet TAKE 1 TABLET EVERY DAY  30 tablet  0  . Dentifrices (BIOTENE DRY MOUTH) GEL Place onto teeth 2 (two) times daily.      Marland Kitchen docusate sodium 100 MG CAPS Take 100 mg by mouth daily.  10 capsule  0  . feeding supplement, ENSURE COMPLETE, (ENSURE COMPLETE) LIQD Take 237 mLs by mouth 2 (two) times daily between meals.      . Fluticasone-Salmeterol (ADVAIR) 250-50 MCG/DOSE AEPB Inhale 1 puff into the lungs 2 (two) times daily.  20 each  0    . furosemide (LASIX) 40 MG tablet Take 40 mg by mouth daily.      Marland Kitchen gabapentin (NEURONTIN) 300 MG capsule Take 1 capsule (300 mg total) by mouth at bedtime.  90 capsule  0  . loratadine (CLARITIN) 10 MG tablet Take 10 mg by mouth daily.      Marland Kitchen Lysine 500 MG TABS Take by mouth daily.      Marland Kitchen  metoprolol tartrate (LOPRESSOR) 25 MG tablet Take 12.5 mg by mouth daily.      . Multiple Vitamins-Minerals (THERA-TABS M PO) Take by mouth daily.      Marland Kitchen oxybutynin (DITROPAN) 5 MG tablet Take 5 mg by mouth at bedtime.       Marland Kitchen oxyCODONE (OXY IR/ROXICODONE) 5 MG immediate release tablet Take 1 tablet (5 mg total) by mouth every 4 (four) hours as needed for severe pain (ok to take for ocugh also).  40 tablet  0  . pantoprazole (PROTONIX) 40 MG tablet Take 1 tablet (40 mg total) by mouth 2 (two) times daily.  60 tablet  0  . polyethylene glycol (MIRALAX / GLYCOLAX) packet Take 17 g by mouth daily as needed.  14 each  0  . potassium chloride SA (K-DUR,KLOR-CON) 20 MEQ tablet Take 1 tablet (20 mEq total) by mouth daily.  90 tablet  3  . simvastatin (ZOCOR) 40 MG tablet TAKE 1 TABLET AT BEDTIME  30 tablet  00  . sucralfate (CARAFATE) 1 G tablet Take 1 g by mouth 4 (four) times daily.      Marland Kitchen tiotropium (SPIRIVA) 18 MCG inhalation capsule Place 1 capsule (18 mcg total) into inhaler and inhale daily.  14 capsule  0   No current facility-administered medications for this visit.    Allergies  Allergen Reactions  . Escitalopram Oxalate Shortness Of Breath, Diarrhea and Nausea And Vomiting    Hot flashes, dizziness  . Indomethacin Other (See Comments)    "made me very very dizzy and made me have vertigo" (08/31/2012)  . Nsaids     Jerking, hot flashes, nausea  . Codeine Nausea And Vomiting    "I can take codeine in my cough medicine" (08/31/2012)  . Sulfonamide Derivatives Other (See Comments)    Drug induced hepatitis in 1989    History   Social History  . Marital Status: Divorced    Spouse Name: N/A     Number of Children: 3  . 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 Smoking status: Former Smoker -- 1.00 packs/day for 40 years    Types: Cigarettes    Quit date: 10/20/1997  . Smokeless tobacco: Never Used  . Alcohol Use: 1.8 oz/week    3 Glasses of wine per week     Comment: occasionally  . Drug Use: No  . Sexual Activity: Not Currently   Other Topics Concern  . Not on file   Social History Narrative   Patient is single and lives at home alone.   Patient is retired.   Patient has some college .   Patient has 3 children.     Family History  Problem Relation Age of Onset  . Breast cancer Sister     x 2  . Colon cancer Neg Hx   . Diabetes Paternal Grandmother   . Uterine cancer Paternal Grandmother   . Lung cancer Paternal Grandmother   . Heart disease Mother   . Alcohol abuse Mother   . Heart disease Father   . Pancreatic cancer Cousin   . Colon polyps Sister   . Diabetes Maternal Uncle   . Heart disease Maternal Grandfather   . Heart disease Paternal Grandfather   . Stroke Paternal Grandfather     Review of Systems:  As stated in the HPI and otherwise negative.   BP 110/80  Pulse 84  Ht 5' 4"  (1.626 m)  Wt 156 lb (70.761 kg)  BMI 26.76 kg/m2  SpO2 95%  Physical Examination: General: Well developed, well nourished, NAD HEENT: OP clear, mucus membranes moist SKIN: warm, dry. No rashes. Neuro: No focal deficits Musculoskeletal: Muscle strength 5/5 all ext Psychiatric: Mood and affect normal Neck: No JVD, no carotid bruits, no thyromegaly, no lymphadenopathy. Lungs:Clear bilaterally, no wheezes, rhonci, crackles Cardiovascular: Regular rate and rhythm. No murmurs, gallops or rubs. Abdomen:Soft. Bowel sounds present. Non-tender.  Extremities: No lower extremity edema. Pulses are 2 + in the bilateral DP/PT.  Stress myoview 05/09/13: Stress Procedure: The patient received IV  Lexiscan 0.4 mg over 15-seconds with concurrent low level exercise and then Technetium 34mSestamibi was injected at 30-seconds while the patient continued walking one more minute. This patient got sob and chest tightness with the Lexiscan injection. Quantitative spect images were obtained after a 45-minute delay.  Stress ECG: No significant ST segment change suggestive of ischemia.  QPS  Raw Data Images: Acquisition technically good; normal left ventricular size.  Stress Images: There is decreased uptake in the anterior wall, septum and apex.  Rest Images: There is decreased uptake in the anterior wall, septum and apex.  Subtraction (SDS): There is a fixed defect that is most consistent with a previous infarction.  Transient Ischemic Dilatation (Normal <1.22): n/a  Lung/Heart Ratio (Normal <0.45): 0.26  Quantitative Gated Spect Images  QGS EDV: 94 ml  QGS ESV: 39 ml  Impression  Exercise Capacity: Lexiscan with no exercise.  BP Response: Normal blood pressure response.  Clinical Symptoms: There is chest tightness and dyspnea.  ECG Impression: No significant ST segment change suggestive of ischemia.  Comparison with Prior Nuclear Study: No images to compare  Overall Impression: Intermediate risk stress nuclear study with large, severe, fixed defect in the anterior wall, septum and apex consistent with prior infarct; no ischemia..  LV Ejection Fraction: 59%. LV Wall Motion: Akinesis of the anterior wall and apex; calculated EF appears to overestimate LV function; suggest echocardiogram to better assess.  Cardiac cath 11/25/13: Left mainstem: Normal  Left anterior descending (LAD): The LAD is widely patent at the site of the proximal stent. There is focal 40% disease in the mid vessel. The first diagonal has 30-40% disease.  Left circumflex (LCx): The LCx gives off 3 small OM branches. There is a stent in the proximal vessel. The vessel is widely patent.  Right coronary artery (RCA): The RCA has  diffuse calcified plaque in the proximal to mid vessel up to 20-30%.  Left ventriculography: Left ventricular systolic function is abnormal, there is distal anteroapical and inferoapical akinesis. LVEF is estimated at 40%, there is no significant mitral regurgitation  Final Conclusions:  1. Nonobstructive CAD  2. Moderate LV dysfunction  Assessment and Plan:   1. CAD: Stable. Known CAD. Recent cath February 2015 with stable disease.  Continue Plavix, statin and beta blocker.  Will stop ASA due to bruising.   2. Ischemic Cardiomyopathy: Volume stable. ACE inhibitor was stopped due to hypotension. [name] is on beta blocker.    3.  Hypertension: Controlled. No changes.   4.  Hyperlipidemia: Continue statin.   5. COPD: [name] is followed by Dr. AElsworth Soho   6. GERD: Continue Protonix to 40 mg po BID.

## 2014-06-15 NOTE — Patient Instructions (Signed)
Your physician wants you to follow-up in:  6 months. You will receive a reminder letter in the mail two months in advance. If you don't receive a letter, please call our office to schedule the follow-up appointment.   

## 2014-06-16 ENCOUNTER — Encounter: Payer: Self-pay | Admitting: Internal Medicine

## 2014-06-16 ENCOUNTER — Other Ambulatory Visit (INDEPENDENT_AMBULATORY_CARE_PROVIDER_SITE_OTHER): Payer: Medicare Other

## 2014-06-16 ENCOUNTER — Ambulatory Visit (INDEPENDENT_AMBULATORY_CARE_PROVIDER_SITE_OTHER): Payer: Medicare Other | Admitting: Internal Medicine

## 2014-06-16 VITALS — BP 130/90 | HR 90 | Temp 98.1°F | Ht 64.5 in | Wt 156.5 lb

## 2014-06-16 DIAGNOSIS — I2510 Atherosclerotic heart disease of native coronary artery without angina pectoris: Secondary | ICD-10-CM

## 2014-06-16 DIAGNOSIS — B369 Superficial mycosis, unspecified: Secondary | ICD-10-CM

## 2014-06-16 DIAGNOSIS — E876 Hypokalemia: Secondary | ICD-10-CM

## 2014-06-16 DIAGNOSIS — G479 Sleep disorder, unspecified: Secondary | ICD-10-CM | POA: Insufficient documentation

## 2014-06-16 DIAGNOSIS — E1159 Type 2 diabetes mellitus with other circulatory complications: Secondary | ICD-10-CM

## 2014-06-16 LAB — POTASSIUM: POTASSIUM: 3.1 meq/L — AB (ref 3.5–5.1)

## 2014-06-16 LAB — HEMOGLOBIN A1C: Hgb A1c MFr Bld: 6 % (ref 4.6–6.5)

## 2014-06-16 MED ORDER — KETOCONAZOLE 2 % EX CREA: 1.0000 "application " | TOPICAL_CREAM | Freq: Two times a day (BID) | CUTANEOUS | Status: AC

## 2014-06-16 NOTE — Patient Instructions (Addendum)
Your next office appointment will be determined based upon review of your pending labs. Those instructions will be transmitted to you through My Chart  OR  by mail;whichever process is your choice to receive results & recommendations .   Daughter's address:  Georgiann Mohs [address]  [address]  [place] , [place] [address]

## 2014-06-16 NOTE — Progress Notes (Signed)
Pre visit review using our clinic review tool, if applicable. No additional management support is needed unless otherwise documented below in the visit note. 

## 2014-06-16 NOTE — Progress Notes (Signed)
   Subjective:    Patient ID: [name], female    DOB: 1941-03-14, 73 y.o.   MRN: [id number]  HPI  The appointment was originally scheduled for annual evaluation; she has been evaluated extensively in the last 6+ months and hospitalized for a prolonged period time with subsequent rehabilitation stay.  Her family has told her that she exhibits "night terrors" manifested as violent behavior and yelling. She is unaware of any of this.  She does snore but has no history of apnea. Apparently official sleep testing previously done by Dr Beacher May did not reveal any apnea  Her sleep architecture was reviewed. She typically goes to bed at 9 PM but will watch TV. She has difficulty getting to sleep and sometimes will still be awake as of 3 AM.  She denies any frequent awakenings after she has gone to sleep  She denies daytime naps or excess daytime somnulence.  She is involved in physical therapy 2 times per week  She denies excess intake of stimulants or alcohol. Alcohol intake is minimal.   Review of Systems     She has constant pain in the right shoulder; elective surgery could never be completed. She will be following up with her orthopedist. At this time she takes oxycodone 5 mg in the morning and evening. This does not completely control the pain  She was recently found to have hypokalemia with a value of 2.9. She had not been taking her potassium twice a day with her twice a day Lasix. Subsequently she's been decreased to one Lasix and one potassium daily Random glucose was 151; her last A1c was 6.7% on 02/27/14.  She does describe polydipsia but she is on oxybutynin. She does not have polyphagia, or polyuria.     Objective:   Physical Exam  Positive or pertinent findings include: Upper and lower partials. There is a minimal rumble suggested in the right carotid. Breath sounds are markedly decreased. She is wearing nasal O2 She has trace pedal edema She does have varicosities  of the lower extremities. There is a 1.5 x 1.5 cm fungal dermatitis over the dorsum of the left foot. She does have some chronic fungal toenail changes.   General appearance :somewhat chronically ill but adequately nourished; in no distress. Eyes: No conjunctival inflammation or scleral icterus is present. Oral exam:  Lips and gums are healthy appearing.There is no oropharyngeal erythema or exudate noted.  Heart:  Normal rate and regular rhythm. S1 and S2 normal without gallop, murmur, click, rub or other extra sounds   Abdomen: bowel sounds normal, soft and non-tender without masses, organomegaly or hernias noted.  No guarding or rebound. No flank tenderness to percussion. Skin:Warm & dry.  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

## 2014-06-16 NOTE — Assessment & Plan Note (Signed)
Refer to Dr Beacher May

## 2014-06-20 ENCOUNTER — Other Ambulatory Visit: Payer: Self-pay | Admitting: Internal Medicine

## 2014-06-20 ENCOUNTER — Telehealth: Payer: Self-pay | Admitting: *Deleted

## 2014-06-20 DIAGNOSIS — G479 Sleep disorder, unspecified: Secondary | ICD-10-CM

## 2014-06-20 NOTE — Telephone Encounter (Signed)
Left msg on triage stating md was going to refer her back to see Dr. Brett Fairy concerning her sleep disorder. Pt states she rather see some one else. Dr. Brett Fairy had several years to help her would like to see some else...[name]

## 2014-07-07 ENCOUNTER — Telehealth: Payer: Self-pay | Admitting: Pulmonary Disease

## 2014-07-07 NOTE — Telephone Encounter (Signed)
Spoke with the pt  She states that she is needing RA to review her recent hospital records from [facility]  She is in [place] now and will not be able to come in to see [place] again in Nov She is feeling better, but wants RA to review records  They are in care everywhere  Please review and advise if we have any recs

## 2014-07-11 NOTE — Telephone Encounter (Signed)
Please advise RA. Thanks.

## 2014-07-11 NOTE — Telephone Encounter (Signed)
lmomtcb x1 

## 2014-07-11 NOTE — Telephone Encounter (Signed)
Pt returned call. Please call back at [phone]. Please dial area code to get pt

## 2014-07-11 NOTE — Telephone Encounter (Signed)
Called spoke with pt. She reports she fell and broke her wrist yesterday. She is going to see hand specialists tomorrow and thinks she may have to have surgery done. FYI for Dr. [name].

## 2014-07-11 NOTE — Telephone Encounter (Signed)
I reviewed what I could see -reports of CXR No new recommendations Pl keep [place] updated

## 2014-07-13 ENCOUNTER — Ambulatory Visit: Payer: Medicare Other | Admitting: Pulmonary Disease

## 2014-07-14 ENCOUNTER — Telehealth: Payer: Self-pay | Admitting: Pulmonary Disease

## 2014-07-14 MED ORDER — GUAIFENESIN-CODEINE 100-10 MG/5ML PO SYRP: 5.0000 mL | ORAL_SOLUTION | Freq: Three times a day (TID) | ORAL | Status: DC | PRN

## 2014-07-14 NOTE — Telephone Encounter (Signed)
lmtcb x1 

## 2014-07-14 NOTE — Telephone Encounter (Signed)
Did review what was available thru care everywhere Do not have a recommendation but would be happy to speak to anyone that she chooses

## 2014-07-14 NOTE — Telephone Encounter (Signed)
Ok x 4 oz and will need to come here or see doctor there as this is narcotic containing and will need to be monitored going forward

## 2014-07-14 NOTE — Telephone Encounter (Signed)
Called and spoke with pt and she stated that she has been at her daughters house and she recently was taken to the hospital in [place].  She stated that they kept her for 9 days and treated her with abx for bronchitis.  She stated that she is doing some better but is still having to use the cough syrup to help control her cough.  She is requesting a refill of the cherry tussin ac and is aware that this will have to be mailed to her.    RA is not in the office today.  Pt is trying to get set up with a pulmonologist there in [place].  MW please advise. If ok to refill and mail the rx for the cough med.  Thanks  Allergies  Allergen Reactions  . Escitalopram Oxalate Shortness Of Breath, Diarrhea and Nausea And Vomiting    Hot flashes, dizziness  . Indomethacin Other (See Comments)    "made me very very dizzy and made me have vertigo" (08/31/2012)  . Nsaids     Jerking, hot flashes, nausea  . Codeine Nausea And Vomiting    "I can take codeine in my cough medicine" (08/31/2012)  . Sulfonamide Derivatives Other (See Comments)    Drug induced hepatitis in 1989    Current Outpatient Prescriptions on File Prior to Visit  Medication Sig Dispense Refill  . albuterol (PROVENTIL) (2.5 MG/3ML) 0.083% nebulizer solution Take 2.5 mg by nebulization every 6 (six) hours as needed for wheezing or shortness of breath.      . anastrozole (ARIMIDEX) 1 MG tablet Take 1 tablet (1 mg total) by mouth daily.  90 tablet  3  . aspirin EC 81 MG tablet Take 81 mg by mouth daily.      . busPIRone (BUSPAR) 15 MG tablet Take 1 tablet (15 mg total) by mouth 2 (two) times daily.  180 tablet  3  . clonazePAM (KLONOPIN) 0.5 MG tablet Take 1 tablet (0.5 mg total) by mouth at bedtime.  90 tablet  0  . clopidogrel (PLAVIX) 75 MG tablet TAKE 1 TABLET EVERY DAY  90 tablet  3  . Dentifrices (BIOTENE DRY MOUTH) GEL Place onto teeth 2 (two) times daily.      Marland Kitchen docusate sodium 100 MG CAPS Take 100 mg by mouth daily.  10 capsule  0  .  feeding supplement, ENSURE COMPLETE, (ENSURE COMPLETE) LIQD Take 237 mLs by mouth 2 (two) times daily between meals.      . Fluticasone-Salmeterol (ADVAIR) 250-50 MCG/DOSE AEPB Inhale 1 puff into the lungs 2 (two) times daily.  20 each  0  . furosemide (LASIX) 40 MG tablet Take 40 mg by mouth daily.      Marland Kitchen gabapentin (NEURONTIN) 300 MG capsule Take 1 capsule (300 mg total) by mouth at bedtime.  90 capsule  0  . ketoconazole (NIZORAL) 2 % cream Apply 1 application topically 2 (two) times daily.  15 g  0  . loratadine (CLARITIN) 10 MG tablet Take 10 mg by mouth daily.      Marland Kitchen Lysine 500 MG TABS Take by mouth daily.      . metoprolol tartrate (LOPRESSOR) 25 MG tablet Take 12.5 mg by mouth daily.      . Multiple Vitamins-Minerals (THERA-TABS M PO) Take by mouth daily.      Marland Kitchen oxybutynin (DITROPAN) 5 MG tablet Take 5 mg by mouth at bedtime.       Marland Kitchen oxyCODONE (OXY IR/ROXICODONE) 5 MG immediate release tablet  Take 1 tablet (5 mg total) by mouth every 4 (four) hours as needed for severe pain (ok to take for ocugh also).  40 tablet  0  . pantoprazole (PROTONIX) 40 MG tablet Take 1 tablet (40 mg total) by mouth 2 (two) times daily.  60 tablet  0  . polyethylene glycol (MIRALAX / GLYCOLAX) packet Take 17 g by mouth daily as needed.  14 each  0  . potassium chloride SA (K-DUR,KLOR-CON) 20 MEQ tablet Take 1 tablet (20 mEq total) by mouth daily.  90 tablet  3  . simvastatin (ZOCOR) 40 MG tablet TAKE 1 TABLET AT BEDTIME  90 tablet  3  . sucralfate (CARAFATE) 1 G tablet Take 1 g by mouth 4 (four) times daily.      Marland Kitchen tiotropium (SPIRIVA) 18 MCG inhalation capsule Place 1 capsule (18 mcg total) into inhaler and inhale daily.  14 capsule  0   No current facility-administered medications on file prior to visit.

## 2014-07-14 NOTE — Telephone Encounter (Signed)
Called and spoke with pt and she is aware of the rx being mailed to her.    RA pt is wanting to know if you can review her records from when she was in the hospital.  She also wanted to know if you could rec a pulmonologist in [place].  Please advise. thanks

## 2014-07-17 NOTE — Telephone Encounter (Signed)
i called and spoke with pt and she stated that she will be back home the week of Oct 12th  For 2 weeks and is asking to be worked in to see RA.  This schedule is booked those 2 weeks, but pt would rather see RA.  RA please advise. Thanks

## 2014-07-17 NOTE — Telephone Encounter (Signed)
Called pt. appt scheduled for 10/20 at 4:15 to see RA. Nothing further needed

## 2014-07-17 NOTE — Telephone Encounter (Signed)
Mindy, lets discuss & fnd a doub le book slot for her

## 2014-07-18 ENCOUNTER — Other Ambulatory Visit: Payer: Self-pay

## 2014-07-18 ENCOUNTER — Telehealth: Payer: Self-pay

## 2014-07-18 ENCOUNTER — Telehealth: Payer: Self-pay | Admitting: Internal Medicine

## 2014-07-18 ENCOUNTER — Other Ambulatory Visit: Payer: Self-pay | Admitting: *Deleted

## 2014-07-18 NOTE — Telephone Encounter (Signed)
This is Dr Camillia Herter patient.

## 2014-07-18 NOTE — Telephone Encounter (Signed)
[name] from [facility] called to tell you the patient had a hospital stay at [facility] in [place] and they prescribed home health physical therapy, occupational therapy and skill nursing.  The patient will have 6-8 visits.

## 2014-07-18 NOTE — Telephone Encounter (Signed)
Request from pharmacy and chart reviewed. Eliquis and Cardizem  were started at [facility] during recent hospitalization. Per Dr. Angelena Form OK to refill for one month but pt will need office visit. I spoke with pt and gave her this information. She reports she is currently in [place] with family and needs 7 day supply of Eliquis and Cardizem sent to [facility] in [place].  A prescription was sent by [facility] to her mail order and she is waiting for this to arrive.  Pt will be back in [place] from October 10-26 and is requesting to see Dr. Angelena Form.  Appt made for pt to see Dr. Angelena Form on August 09, 2014 at 2:00. I called prescriptions to [facility].

## 2014-07-18 NOTE — Telephone Encounter (Signed)
OK to refill the meds and set up f/u in our office within next month. cdm

## 2014-07-19 ENCOUNTER — Other Ambulatory Visit: Payer: Self-pay

## 2014-07-21 ENCOUNTER — Telehealth: Payer: Self-pay | Admitting: *Deleted

## 2014-07-21 ENCOUNTER — Telehealth: Payer: Self-pay | Admitting: Pulmonary Disease

## 2014-07-21 NOTE — Telephone Encounter (Signed)
Received direct call from [facility] at [facility] [phone]. Pt is being scheduled for open reduction and internal fixation of right distal radius fracture on July 24, 2014.  Fracture happened on July 10, 2014. They are requesting surgical clearance and instructions regarding Eliquis.  Pt has been hospitalized at [facility] since last office visit but does not know name of MD at [facility] to contact regarding clearance.  Pt present at [facility] and is asking if Dr. Angelena Form will clear.  Per Barnett Applebaum they have not requested pulmonary clearance. Will review with Dr. Angelena Form

## 2014-07-21 NOTE — Telephone Encounter (Signed)
Called spoke with pt. She broke her wrist 9 days ago and cast was placed on. Pt was told today wrist is shattered. Pt now needs to have surgery on this. Surgery will be done in [place].  Surgery is not scheduled until RA approves this. She is scheduled to see her cardiologists on Monday.  Called pt. She is scheduled to see RA Thursday 10/8 in [place] at 3. Nothing further needed

## 2014-07-21 NOTE — Telephone Encounter (Signed)
She needs to be seen in our office next week for pre-op evaluation before we can discuss her Eliquis or cardiac risk. cdm

## 2014-07-21 NOTE — Telephone Encounter (Signed)
Spoke with Barnett Applebaum at [facility] and told her pt would need office visit prior to being cleared. I placed call to pt and left information on her identified voicemail to contact our office to schedule appt.

## 2014-07-21 NOTE — Telephone Encounter (Signed)
Follow up  ° ° ° °Returning call back to nurse  °

## 2014-07-21 NOTE — Telephone Encounter (Signed)
Spoke with pt and appointment made for her to see Kathrene Alu on July 24, 2014 at 9:00

## 2014-07-24 ENCOUNTER — Ambulatory Visit (INDEPENDENT_AMBULATORY_CARE_PROVIDER_SITE_OTHER): Payer: Medicare Other | Admitting: Nurse Practitioner

## 2014-07-24 ENCOUNTER — Telehealth: Payer: Self-pay | Admitting: Nurse Practitioner

## 2014-07-24 ENCOUNTER — Other Ambulatory Visit: Payer: Self-pay | Admitting: Nurse Practitioner

## 2014-07-24 ENCOUNTER — Encounter: Payer: Self-pay | Admitting: Nurse Practitioner

## 2014-07-24 VITALS — BP 108/62 | HR 71 | Ht 64.5 in | Wt 157.4 lb

## 2014-07-24 DIAGNOSIS — I255 Ischemic cardiomyopathy: Secondary | ICD-10-CM

## 2014-07-24 DIAGNOSIS — K251 Acute gastric ulcer with perforation: Secondary | ICD-10-CM

## 2014-07-24 DIAGNOSIS — I359 Nonrheumatic aortic valve disorder, unspecified: Secondary | ICD-10-CM

## 2014-07-24 DIAGNOSIS — I10 Essential (primary) hypertension: Secondary | ICD-10-CM

## 2014-07-24 DIAGNOSIS — Z01818 Encounter for other preprocedural examination: Secondary | ICD-10-CM

## 2014-07-24 DIAGNOSIS — I2510 Atherosclerotic heart disease of native coronary artery without angina pectoris: Secondary | ICD-10-CM

## 2014-07-24 DIAGNOSIS — I2589 Other forms of chronic ischemic heart disease: Secondary | ICD-10-CM

## 2014-07-24 DIAGNOSIS — I259 Chronic ischemic heart disease, unspecified: Secondary | ICD-10-CM

## 2014-07-24 LAB — CBC
HCT: 34.1 % — ABNORMAL LOW (ref 36.0–46.0)
Hemoglobin: 10.9 g/dL — ABNORMAL LOW (ref 12.0–15.0)
MCHC: 32.1 g/dL (ref 30.0–36.0)
MCV: 79.6 fl (ref 78.0–100.0)
Platelets: 272 10*3/uL (ref 150.0–400.0)
RBC: 4.29 Mil/uL (ref 3.87–5.11)
RDW: 18 % — ABNORMAL HIGH (ref 11.5–15.5)
WBC: 8.6 10*3/uL (ref 4.0–10.5)

## 2014-07-24 LAB — BASIC METABOLIC PANEL
BUN: 22 mg/dL (ref 6–23)
CO2: 30 mEq/L (ref 19–32)
Calcium: 8.8 mg/dL (ref 8.4–10.5)
Chloride: 98 mEq/L (ref 96–112)
Creatinine, Ser: 1.2 mg/dL (ref 0.4–1.2)
GFR: 48.14 mL/min — ABNORMAL LOW (ref >60.00)
Glucose, Bld: 117 mg/dL — ABNORMAL HIGH (ref 70–99)
Potassium: 3.1 mEq/L — ABNORMAL LOW (ref 3.5–5.1)
Sodium: 137 mEq/L (ref 135–145)

## 2014-07-24 MED ORDER — FUROSEMIDE 40 MG PO TABS: 60.0000 mg | ORAL_TABLET | Freq: Every day | ORAL | Status: AC

## 2014-07-24 NOTE — Addendum Note (Signed)
Addended by: Burtis Junes on: 07/24/2014 10:05 AM   Modules accepted: Orders, Medications

## 2014-07-24 NOTE — Telephone Encounter (Signed)
Spoke with pt. She was started on Cardizem when recently hospitalized. Pharmacy has notified her of interaction with Simvastatin 40 mg daily she is currently taking. I have advised her to stop Simvastatin for now and will forward to Dr. Angelena Form for further dosing instructions.

## 2014-07-24 NOTE — Telephone Encounter (Signed)
New problem:   Pt called to report the pharm called to day some of her medications are inter acting and need some clarification.   Pt would like a call back to speak to some one about this please.   Pt kept asking for [name] to take a look at this.

## 2014-07-24 NOTE — Patient Instructions (Addendum)
We will be checking the following labs today BMET and CBC  Stay on your current medicines but I am stopping the Plavix.  Stop your Eliquis 48 hours prior to your surgery and then restart the night after your surgery  Use support stockings - knee high - wear each day and elevate your legs. Avoid salt  Increase your Lasix to 60 mg each day.  See Dr. Angelena Form  I will send a note to the [facility].   Call the [place] office at [phone] if you have any questions, problems or concerns.

## 2014-07-24 NOTE — Progress Notes (Signed)
[name] Date of Birth: 02/18/1941 Medical Record #[id number]  History of Present Illness: Ms. [name] is seen back today for a pre op visit. Seen for Dr. Angelena Form. She is a 73 year old female with CAD, s/p multiple prior stenting procedures as well as an ischemic CM. She suffered an anterior MI in 1990 treated with TPA followed by angioplasty. She then had a DES placed in the circumflex in 04/2009. She suffered a stroke in 2009 treated with TPA. [place]. 2 weeks later, she was treated again with TPA for a stroke in the hospital in [place], [place]. She was discharged on Plavix. LHC 08/2012 done for chest pain: Proximal LAD 90%, diagonal 30%, circumflex stent patent, mid RCA 50%, EF 35-40%. PCI: Promus DES to proximal LAD. Echo 12/13: EF 40-45% apical hypo-to akinesis. Stress myoview July 2014 with large anterior wall scar but no ischemia. She has had prior breast cancer with left lumpectomy August 2014 with infection of spacer and repeat surgery. Admitted 11/08/13-11/18/13 with respiratory failure in setting of influenza and COPD flare. She was intubated and managed by PCCM. After extubation c/o CP but troponins negative. Echo with poor windows. EF "mild to moderately depressed". Cardiac cath 11/25/13 with stable disease. Admitted again in April 2015 with respiratory failure requiring mechanical ventilation. She is now DNR.   Her other issues include severe COPD - on chronic oxygen, prior strokes, HTN, HLD, and GERD.  Called here on Friday. Needing preop clearance - and wanting Eliquis and Cardizem refilled. We did not have her on these medicines. Apparently has been hospitalized at [facility] since her last visit here which was in August of 2015. She now needs open reduction and internal fixation of right distal radius. Seeing ortho in [place]. Dr. Angelena Form wanted her seen.   Comes in today. Here alone.  [place] over some weights while in [place] two  weeks ago. Now needing surgery. No chest pain. With her last admission to [facility] just a month ago she had AF with RVR - converted back to NSR - placed on Eliquis. This is in addition to the Plavix. Her last coronary stent was in November of 2013. She has really been in poor health this year. Denies chest pain now. Breathing is "ok". Congested cough. More swelling in her legs - hard to say how much salt she gets. No palpitations. No more falls. Says she is not really using her walker.    Current Outpatient Prescriptions  Medication Sig Dispense Refill  . albuterol (PROVENTIL) (2.5 MG/3ML) 0.083% nebulizer solution Take 2.5 mg by nebulization every 6 (six) hours as needed for wheezing or shortness of breath.      . anastrozole (ARIMIDEX) 1 MG tablet Take 1 tablet (1 mg total) by mouth daily.  90 tablet  3  . apixaban (ELIQUIS) 5 MG TABS tablet Take 5 mg by mouth 2 (two) times daily.       . busPIRone (BUSPAR) 15 MG tablet Take 1 tablet (15 mg total) by mouth 2 (two) times daily.  180 tablet  3  . clonazePAM (KLONOPIN) 0.5 MG tablet Take 1 tablet (0.5 mg total) by mouth at bedtime.  90 tablet  0  . clopidogrel (PLAVIX) 75 MG tablet TAKE 1 TABLET EVERY DAY  90 tablet  3  . Dentifrices (BIOTENE DRY MOUTH) GEL Place onto teeth 2 (two) times daily.      Marland Kitchen diltiazem (CARDIZEM CD) 180 MG 24 hr  capsule Take by mouth.      . docusate sodium 100 MG CAPS Take 100 mg by mouth daily.  10 capsule  0  . feeding supplement, ENSURE COMPLETE, (ENSURE COMPLETE) LIQD Take 237 mLs by mouth 2 (two) times daily between meals.      . Fluticasone-Salmeterol (ADVAIR) 250-50 MCG/DOSE AEPB Inhale 1 puff into the lungs 2 (two) times daily.  20 each  0  . furosemide (LASIX) 40 MG tablet Take 40 mg by mouth daily.      Marland Kitchen gabapentin (NEURONTIN) 300 MG capsule Take 1 capsule (300 mg total) by mouth at bedtime.  90 capsule  0  . guaiFENesin-codeine (ROBITUSSIN AC) 100-10 MG/5ML syrup Take 5 mLs by mouth 3 (three) times daily as needed  for cough.  120 mL  0  . ketoconazole (NIZORAL) 2 % cream Apply 1 application topically 2 (two) times daily.  15 g  0  . loratadine (CLARITIN) 10 MG tablet Take 10 mg by mouth daily.      Marland Kitchen Lysine 500 MG TABS Take by mouth daily.      . metoprolol tartrate (LOPRESSOR) 25 MG tablet Take 12.5 mg by mouth daily.      . Multiple Vitamins-Minerals (THERA-TABS M PO) Take by mouth daily.      Marland Kitchen oxybutynin (DITROPAN) 5 MG tablet Take 5 mg by mouth at bedtime.       Marland Kitchen oxyCODONE (OXY IR/ROXICODONE) 5 MG immediate release tablet Take 1 tablet (5 mg total) by mouth every 4 (four) hours as needed for severe pain (ok to take for ocugh also).  40 tablet  0  . pantoprazole (PROTONIX) 40 MG tablet Take 1 tablet (40 mg total) by mouth 2 (two) times daily.  60 tablet  0  . polyethylene glycol (MIRALAX / GLYCOLAX) packet Take 17 g by mouth daily as needed.  14 each  0  . potassium chloride SA (K-DUR,KLOR-CON) 20 MEQ tablet Take 1 tablet (20 mEq total) by mouth daily.  90 tablet  3  . simvastatin (ZOCOR) 40 MG tablet TAKE 1 TABLET AT BEDTIME  90 tablet  3  . sucralfate (CARAFATE) 1 G tablet Take 1 g by mouth 4 (four) times daily.      Marland Kitchen tiotropium (SPIRIVA) 18 MCG inhalation capsule Place 1 capsule (18 mcg total) into inhaler and inhale daily.  14 capsule  0   No current facility-administered medications for this visit.    Allergies  Allergen Reactions  . Escitalopram Oxalate Shortness Of Breath, Diarrhea and Nausea And Vomiting    Hot flashes, dizziness  . Indomethacin Other (See Comments)    "made me very very dizzy and made me have vertigo" (08/31/2012)  . Nsaids     Jerking, hot flashes, nausea  . Codeine Nausea And Vomiting    "I can take codeine in my cough medicine" (08/31/2012)  . Sulfonamide Derivatives Other (See Comments)    Drug induced hepatitis in 1989    Past Medical History  Diagnosis Date  . Other emphysema   . Unspecified arthropathy, shoulder region   . Gastric ulcer, unspecified as  acute or chronic, without mention of hemorrhage, perforation, or obstruction   . Benign paroxysmal positional vertigo   . COPD (chronic obstructive pulmonary disease)   . Diverticula of colon   . Osteopenia   . Personal history of colonic polyps 2007    tubular adenoma high grade dysplasia  . Diverticulitis   . Hyperlipemia   . Fracture of ankle, bimalleolar,  right, closed   . Hypoxemia   . C. difficile diarrhea   . Varicose vein   . Asthma   . GERD (gastroesophageal reflux disease)   . Hepatitis 1989  . Arthritis     "both shoulders"   . TIA (transient ischemic attack) 04/2008; 08/21/2012    "slight droop left lower lip after 08/21/12 TIA"  . On home oxygen therapy   . Ischemic cardiomyopathy     a. EF 45-50% 2011. b. EF 35-40% by cath 09/01/12.  . Night terrors, adult     dr Leonie Man referred  for PSG in 2011, and again in 2013   . Breast cancer 04/08/13 bx    left  . Allergy   . CAD (coronary artery disease)     a. ant MI 79 tx with TPA/PTCA. b. Prior stenting procedures including RCA stent, DES to LCx 04/2011. c. [place] -> DES  to prox LAD 08/2012.   Marland Kitchen Hypertension     TAKES FOR H/O STROKE  . CVA (cerebral vascular accident)   . Stroke, acute, embolic     Nov 6213 , [facility] , was treated with TPA.   . 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 Path G1 IDC, 1.6 cm, neg margin, neg SLN     Past Surgical History  Procedure Laterality Date  . Total shoulder arthroplasty  2012    left  . Orif ankle fracture  2007    right  . Martial megeti  0865'H    "bladder operation; not successful " (08/31/2012)  . Varicose vein surgery  1983; 1985    "both legs both times" (08/31/2012)  . Total shoulder replacement  12/05/2010    Dr Onnie Graham  . Abdominal hysterectomy      TAH w/BSO  . Augmentation mammaplasty  1975  . Coronary angioplasty with stent placement   2010  . Coronary angioplasty  1990    "3 times" (08/31/2012)  . Colon surgery  07/07/2011    Dr. Hassell Done  . Appendectomy  1960  . Nose surgery      1977  . Breast augmenttation    . Catarct  right      RIGHT EYE  . Breast lumpectomy with needle localization and axillary sentinel lymph node bx Left 06/02/2013    Procedure: BREAST LUMPECTOMY WITH NEEDLE LOCALIZATION AND AXILLARY SENTINEL LYMPH NODE BX;  Surgeon: Haywood Lasso, MD;  Location: [facility];  Service: General;  Laterality: Left;  NEEDLE LOC BCG 7:30 NUC MED 9:30 RECONSTRUCTION TO FOLLOW 1 HOUR added   . Breast implant removal Left 06/02/2013    Procedure: REMOVAL BREAST IMPLANTS; LEFT BREAST CAPSULECTOMY WITH PLACEMENT OF TISSUE EXPANDER AND USE OF FLEX HD;  Surgeon: Crissie Reese, MD;  Location: [facility];  Service: Plastics;  Laterality: Left;    History  Smoking status  . Former Smoker -- 1.00 packs/day for 40 years  . Types: Cigarettes  . Quit date: 10/20/1997  Smokeless tobacco  . Never Used    History  Alcohol Use  . 1.8 oz/week  . 3 Glasses of wine per week    Comment: occasionally    Family History  Problem Relation Age of Onset  . Breast cancer Sister     x 2  . Colon cancer Neg Hx   . Diabetes Paternal Grandmother   . Uterine cancer Paternal Grandmother   .  Lung cancer Paternal Grandmother   . Heart disease Mother   . Alcohol abuse Mother   . Heart disease Father   . Pancreatic cancer Cousin   . Colon polyps Sister   . Diabetes Maternal Uncle   . Heart disease Maternal Grandfather   . Heart disease Paternal Grandfather   . Stroke Paternal Grandfather     Review of Systems: The review of systems is per the HPI.  All other systems were reviewed and are negative.  Physical Exam: BP 108/62  Pulse 71  Ht 5' 4.5" (1.638 m)  Wt 157 lb 6.4 oz (71.396 kg)  BMI 26.61 kg/m2  SpO2 92% Patient is very pleasant and in no acute distress but she looks chronically ill. Congested cough. Her weight is stable.  She has oxygen in place. Skin is warm and dry. Color is normal.  HEENT is unremarkable. Normocephalic/atraumatic. PERRL. Sclera are nonicteric. Neck is supple. No masses. No JVD. Lungs are clear. Cardiac exam shows a regular rate and rhythm. Abdomen is soft. Extremities are with 1+edema. Gait and ROM are intact. She is using a walker. Her right arm is in a soft cast. No gross neurologic deficits noted.  Wt Readings from Last 3 Encounters:  07/24/14 157 lb 6.4 oz (71.396 kg)  06/16/14 156 lb 8 oz (70.988 kg)  06/15/14 156 lb (70.761 kg)    LABORATORY DATA/PROCEDURES:  EKG today shows sinus rhythm - reviewed with Dr. Acie Fredrickson (DOD)  Lab Results  Component Value Date   WBC 8.6 05/10/2014   HGB 10.6* 05/10/2014   HCT 33.3* 05/10/2014   PLT 268 05/10/2014   GLUCOSE 151* 05/10/2014   CHOL 137 08/31/2013   TRIG 151* 01/31/2014   HDL 43.00 08/31/2013   LDLCALC 80 08/31/2013   ALT 20 05/10/2014   AST 18 05/10/2014   NA 143 05/10/2014   K 3.1* 06/16/2014   CL 98 02/27/2014   CREATININE 1.1 05/10/2014   BUN 18.6 05/10/2014   CO2 29 05/10/2014   TSH 1.090 02/07/2014   INR 1.04 11/09/2013   HGBA1C 6.0 06/16/2014    BNP (last 3 results)  Recent Labs  11/15/13 0545 11/18/13 0400 01/25/14 2154  PROBNP 969.8* 241.2* 485.2*   Procedure: Left Heart Cath, Selective Coronary Angiography, LV angiography  Indication: 73 yo WF with history of CAD and prior stents is seen after prolonged hospitalization with respiratory failure and the flu. She has refractory chest pain. Enzymes are negative.  Procedural Details: The right wrist was prepped, draped, and anesthetized with 1% lidocaine. Using the modified Seldinger technique, a 5 French sheath was introduced into the right radial artery. 3 mg of verapamil was administered through the sheath, weight-based unfractionated heparin was administered intravenously. Standard Judkins catheters were used for selective coronary angiography and left ventriculography. Catheter  exchanges were performed over an exchange length guidewire. There were no immediate procedural complications. A TR band was used for radial hemostasis at the completion of the procedure. The patient was transferred to the post catheterization recovery area for further monitoring.  Procedural Findings:  Hemodynamics:  AO 114/61 mean 83 mm Hg  LV 114/15 mm Hg  Coronary angiography:  Coronary dominance: right  Left mainstem: Normal  Left anterior descending (LAD): The LAD is widely patent at the site of the proximal stent. There is focal 40% disease in the mid vessel. The first diagonal has 30-40% disease.  Left circumflex (LCx): The LCx gives off 3 small OM branches. There is a stent in the proximal  vessel. The vessel is widely patent.  Right coronary artery (RCA): The RCA has diffuse calcified plaque in the proximal to mid vessel up to 20-30%.  Left ventriculography: Left ventricular systolic function is abnormal, there is distal anteroapical and inferoapical akinesis. LVEF is estimated at 40%, there is no significant mitral regurgitation  Final Conclusions:  1. Nonobstructive CAD  2. Moderate LV dysfunction.  Recommendations: Continue medical Rx. Will observe overnight. If stable can [facility] to [facility] in the am.  Collier [facility]  11/25/2013, 6:34 PM     Echo Study Conclusions from January 2015  Left ventricle: Extremely poor acoustic windows limit study LV is difficult to see. Overall LVEF appears to be mild to moderately depressed. There is akinesis of the apex. Other walls are difficult to see well. The cavity size was normal. Wall thickness was normal. Doppler parameters are consistent with abnormal left ventricular relaxation (grade 1 diastolic dysfunction).   Echo Study from [facility] 06/2014 Summary:  1. The study quality is poor.  2. The apex appears aneurysmal, can not exclude small apical pseudo  aneurysm. Anteroseptal hypokinesis noted.  3. The left ventricle  mass index and the relative wall thickness values  indicate normal geometry.  4. There is moderate sclerosis of the aortic valve cusps.  5. There is moderate mitral regurgitation observed.  6. There is trivial tricuspid regurgitation.  7. The pericardial effusion is localized anteriorally.  8. No evidence of thrombus.  9. The ejection fraction is estimated to be 45%.  10. The inferior vena cava appears dilated suggesting elevated central  venous pressure.  11. The estimated RA pressure is severely elevated based on IVC without  any inspiratory collapse and IVC is greater than 1.7 cm in diameter.  Recommendation:  1. Bacterial endocarditis prophylaxis is not recommended.  Findings  Technical Comments:  The study quality is poor.   Assessment / Plan: 1. Pre op clearance - needs wrist surgery from prior fall - felt to be at least moderate to high risk given her health issues. Will need to stop Plavix. Stop Eliquis 48 hours prior to procedure and restart the night following her surgery. She will be done in the hospital. She has cardiology care from [facility] available to her.   2. CAD - last cath in 11/2013 with stable disease -  No active symptoms. Her last stent was in November of 2013 - will go ahead and stop the Plavix altogether. She is already off of aspirin.   3. ICM - EF of 45% by recent echo in September from [place]. More swelling. Weight stable - will let her increase her Lasix to 60 mg. Rechecking her labs today.   4. COPD - on oxygen  Felt to be at least moderate to high risk given her multiple comorbidities. Will need to proceed with the surgery to have her wrist fixed. Her cardiac cath from earlier this year is reviewed.   See back as planned later this month.  Patient is agreeable to this plan and will call if any problems develop in the interim.   Burtis Junes, RN, [place] [address] [place], [place]  [phone]

## 2014-07-24 NOTE — Telephone Encounter (Signed)
Left message to call back  

## 2014-07-25 ENCOUNTER — Telehealth: Payer: Self-pay | Admitting: *Deleted

## 2014-07-25 ENCOUNTER — Other Ambulatory Visit: Payer: Self-pay | Admitting: *Deleted

## 2014-07-25 MED ORDER — POTASSIUM CHLORIDE CRYS ER 20 MEQ PO TBCR: 20.0000 meq | EXTENDED_RELEASE_TABLET | Freq: Three times a day (TID) | ORAL | Status: DC

## 2014-07-25 NOTE — Telephone Encounter (Signed)
Left msg on triage stating wanting to inform md will be placing pt on hold for home health. The patient has travel from [place] to [facility] to see her cardiologist & pulmonologist to get medical clearance for hand surgery...Johny Chess

## 2014-07-25 NOTE — Telephone Encounter (Signed)
Left message for pt to call back  °

## 2014-07-25 NOTE — Telephone Encounter (Signed)
Pat, we can change her to atorvastatin 20 mg daily if she is willing to change. Gerald Stabs

## 2014-07-27 ENCOUNTER — Ambulatory Visit (INDEPENDENT_AMBULATORY_CARE_PROVIDER_SITE_OTHER): Payer: Medicare Other | Admitting: Pulmonary Disease

## 2014-07-27 ENCOUNTER — Encounter: Payer: Self-pay | Admitting: Pulmonary Disease

## 2014-07-27 VITALS — BP 127/72 | HR 96 | Temp 97.7°F | Ht 64.5 in | Wt 159.0 lb

## 2014-07-27 DIAGNOSIS — J9612 Chronic respiratory failure with hypercapnia: Secondary | ICD-10-CM

## 2014-07-27 DIAGNOSIS — J441 Chronic obstructive pulmonary disease with (acute) exacerbation: Secondary | ICD-10-CM

## 2014-07-27 DIAGNOSIS — I4891 Unspecified atrial fibrillation: Secondary | ICD-10-CM | POA: Insufficient documentation

## 2014-07-27 DIAGNOSIS — I2510 Atherosclerotic heart disease of native coronary artery without angina pectoris: Secondary | ICD-10-CM

## 2014-07-27 NOTE — Assessment & Plan Note (Signed)
It does seem that she requires oxygen on exertion. She can stay off oxygen while at rest. She is hopeful that she will come off oxygen in the future, this remains to be seen

## 2014-07-27 NOTE — Assessment & Plan Note (Signed)
We discussed, that with her severe COPD, and several exacerbations this year, she is certainly high risk to undergo general anesthesia. I subsequently spoke to her surgeon Dr Robley Fries - I do agree that regional block would be the safest anesthesia for her. This would certainly decrease her risk for mechanical ventilation should there a complicated course. 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 Her Advair and Spiriva can be continued until the day of surgery. Early mobilization and DVT prophylaxis as would be usual. If she has bronchospasm on day of surgery, duonebs every 6 hours and Solu-Medrol IV 40 every 8 hours in the hospital can be used. I will be available for phone consultation as needed

## 2014-07-27 NOTE — Assessment & Plan Note (Signed)
Recommendations per cardiology- apixaban should be stopped at least 48 hours prior to surgery. Given her recent issues with hypokalemia, would suggest potassium recheck on the morning of surgery and replete as needed

## 2014-07-27 NOTE — Progress Notes (Signed)
Subjective:    Patient ID: [name], female    DOB: 1941/08/21, 73 y.o.   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  12/2009 FEV1 1.0 L (48%)  She smoked for 40 years and quit in 1999. Advair worked better than symbicort.  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  Cardiac cath 08/31/12 with severe stenosis proximal LADs/p drug eluting stent proximal LAD  09/2013 Underwent surgery Left breast for breast cancer, Developed post op infectious complications  03/8615 severe flare of COPD >>mechanical ventilation for several days (Flu + MSSA pneumonia) [facility]. Placed on 24h o2  01/2014 >> mechanical ventilation due to AECOPD in setting of MSSA purulent bronchitis >> [facility] >> son's home  She requested DNI status as well as DNR status.     07/27/2014  Chief Complaint  Patient presents with  . Follow-up    Pt is needing surgical clearance for wrist surgery to be done by Dr. Sarita Bottom. Pt states she is not having  any increased SOB from normal.    hospital records from [place] x 7ds - copd flare- bronchitis - abx, course complicated by AF-rvr - dilzem + apixaban Soon after returning home, she had a Fall - wrist fracture  She is considering having wrist surgery and is here for clearance She was seen by cardiology, labs showed low potassium of 3.1-Lasix was also increased to 60 mg, potassium supplements increased. She reports a chronic wet cough, clear sputum, she is able to go without oxygen for short periods. Today her saturation was 93%-off  oxygen for about 10 minutes, but she did desaturate to 88% after the first nap.  Past Medical History  Diagnosis Date  . Other emphysema   . Unspecified arthropathy, shoulder region   . Gastric ulcer, unspecified as acute or chronic, without mention of hemorrhage, perforation, or obstruction   . Benign paroxysmal positional vertigo   . COPD (chronic obstructive pulmonary disease)   . Diverticula of colon   . Osteopenia   . Personal history of colonic polyps 2007    tubular adenoma high grade dysplasia  . Diverticulitis   . Hyperlipemia   . Fracture of ankle, bimalleolar, right, closed   . Hypoxemia   . C. difficile diarrhea   . Varicose vein   . Asthma   . GERD (gastroesophageal reflux disease)   . Hepatitis 1989  . Arthritis     "both shoulders"   . TIA (transient ischemic attack) 04/2008; 08/21/2012    "slight droop left lower lip after 08/21/12 TIA"  . On home oxygen therapy   . Ischemic cardiomyopathy     a. EF 45-50% 2011. b. EF 35-40% by cath 09/01/12.  . Night terrors, adult     dr Leonie Man referred  for PSG in 2011, and again in 2013   . Breast cancer 04/08/13 bx    left  . Allergy   . CAD (coronary artery disease)     a. ant MI 41 tx with TPA/PTCA. b. Prior stenting procedures including  RCA stent, DES to LCx 04/2011. c. [place] -> DES  to prox LAD 08/2012.   Marland Kitchen Hypertension     TAKES FOR H/O STROKE  . CVA (cerebral vascular accident)   . Stroke, acute, embolic     Nov 7342 , [place]  [facility] , was treated with TPA.   . 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 Path G1 IDC, 1.6 cm, neg margin, neg SLN      Review of Systems neg for any significant sore throat, dysphagia, itching, sneezing, nasal congestion or excess/ purulent secretions, fever, chills, sweats, unintended wt loss, pleuritic or exertional cp, hempoptysis, orthopnea pnd or change in  chronic leg swelling. Also denies presyncope, palpitations, heartburn, abdominal pain, nausea, vomiting, diarrhea or change in bowel or urinary habits, dysuria,hematuria, rash, arthralgias, visual complaints, headache, numbness weakness or ataxia.      Objective:   Physical Exam Gen. Pleasant, well-nourished, in no distress, anxious affect ENT - no lesions, no post nasal drip Neck: No JVD, no thyromegaly, no carotid bruits Lungs: no use of accessory muscles, no dullness to percussion, decreased without rales or rhonchi  Cardiovascular: Rhythm regular, heart sounds  normal, no murmurs or gallops, 2+ peripheral edema Abdomen: soft and non-tender, no hepatosplenomegaly, BS normal. Musculoskeletal: No deformities, no cyanosis or clubbing Neuro:  alert, non focal        Assessment & Plan:

## 2014-07-27 NOTE — Patient Instructions (Addendum)
You are at moderate risk for general anesthesia I will speak to your surgeon & advise them

## 2014-07-28 ENCOUNTER — Telehealth: Payer: Self-pay

## 2014-07-28 NOTE — Telephone Encounter (Signed)
Phone call from [employer] with [facility]. She wants to inform you that patient is being placed on hold for occupational therapy until she comes back from being out of town.

## 2014-07-28 NOTE — Telephone Encounter (Signed)
Juliann Pulse from the [facility] called stating pt had came in for mammogram and bone density, and the pt believes she never had the bone density done, results are in imaging folder. Juliann Pulse states she has been trying to reach the pt for the last several weeks and was unable to reach her. Informed Juliann Pulse i would try calling pt. Called pt and was informed she had fallen and is having to have surgery on Monday on her wrist. States she will contact the breast center when she gets done with post op surgery. States "those are not her bone density results that she never had a bone density done." But will call and speak with Juliann Pulse when she can. Informed Hinda Lenis, RN with Dr Jana Hakim of issue. Called and informed Juliann Pulse the patient would contact her when she was ready.Marland Kitchen

## 2014-08-01 ENCOUNTER — Telehealth: Payer: Self-pay | Admitting: *Deleted

## 2014-08-01 NOTE — Telephone Encounter (Signed)
OK 

## 2014-08-01 NOTE — Telephone Encounter (Signed)
Left msg on triage stating pt has had her hand surgery spoke with her daughter on yesterday, and they are wanting to start back with home care services. Requesting verbal order to resume HH, along with PY/OT, and nursing aide. Is this ok....Johny Chess

## 2014-08-01 NOTE — Telephone Encounter (Signed)
Notified nurse with md response...Johny Chess

## 2014-08-02 ENCOUNTER — Telehealth: Payer: Self-pay | Admitting: *Deleted

## 2014-08-02 ENCOUNTER — Telehealth: Payer: Self-pay | Admitting: Internal Medicine

## 2014-08-02 NOTE — Telephone Encounter (Signed)
Z. Bodie w/[facility] called requesting a verbal ok to send an order to put occupational therapy on hold this week at patient's request.  Patient doesn't want to because of recent surgery.  Please call ASAP

## 2014-08-02 NOTE — Telephone Encounter (Signed)
Left msg on triage stating pt have a small open sore on her (L) lower buttock area. Not sure what happen, but wanting to see if its ok to use Calcium AG dressing or Tanderum dressing on wound...Johny Chess

## 2014-08-02 NOTE — Telephone Encounter (Signed)
OK 

## 2014-08-02 NOTE — Telephone Encounter (Signed)
Notified nurse with md response...Johny Chess

## 2014-08-02 NOTE — Telephone Encounter (Signed)
Dip gauze in  sterile saline and applied to the wound twice a day. Cover the wound with Telfa , non stick dressing  without any antibiotic ointment. The saline can be purchased at the drugstore or you can make your own .Boil cup of salt in a gallon of water. Store mixture  in a clean container.Report Warning  signs as discussed (red streaks, pus, fever, increasing pain).Be seen in no better

## 2014-08-03 ENCOUNTER — Telehealth: Payer: Self-pay | Admitting: *Deleted

## 2014-08-03 NOTE — Telephone Encounter (Signed)
Peggie from [facility] called requesting to increase OT & home health aid to 2 times/week. Verbal order given.

## 2014-08-04 NOTE — Telephone Encounter (Signed)
Notified nurse with md response...Johny Chess

## 2014-08-08 ENCOUNTER — Ambulatory Visit: Payer: Medicare Other | Admitting: Pulmonary Disease

## 2014-08-09 ENCOUNTER — Ambulatory Visit: Payer: Medicare Other | Admitting: Cardiovascular Disease

## 2014-08-10 ENCOUNTER — Telehealth: Payer: Self-pay | Admitting: Cardiovascular Disease

## 2014-08-10 MED ORDER — ATORVASTATIN CALCIUM 20 MG PO TABS: 20.0000 mg | ORAL_TABLET | Freq: Every day | ORAL | Status: AC

## 2014-08-10 NOTE — Telephone Encounter (Signed)
See phone note dated 07/24/14 for documentation related to this call.

## 2014-08-10 NOTE — Telephone Encounter (Signed)
Spoke with pt and gave her instructions from Dr. Angelena Form. She would like to start atorvastatin 20 mg daily. Medication needs to be sent to [facility] order. She is not currently at home and requests medication be mailed to where she is currently living.  Address is [address] [place], [place] [address].  I spoke with [facility] and they cannot accept address changes from provider's office. Pt will need to call to update address. Number for pt to call at [facility] is [phone]. I spoke with pt and gave her this information. She will contact [employer] and then let me know when address is updated.

## 2014-08-10 NOTE — Telephone Encounter (Signed)
Follow up ° °Pt returned call//sr  °

## 2014-08-10 NOTE — Telephone Encounter (Signed)
Spoke with pt. She has contacted pharmacy and updated information. Will send prescription electronically.

## 2014-08-16 ENCOUNTER — Telehealth: Payer: Self-pay | Admitting: Internal Medicine

## 2014-08-16 ENCOUNTER — Telehealth: Payer: Self-pay | Admitting: *Deleted

## 2014-08-16 DIAGNOSIS — R5381 Other malaise: Secondary | ICD-10-CM

## 2014-08-16 DIAGNOSIS — J9612 Chronic respiratory failure with hypercapnia: Secondary | ICD-10-CM

## 2014-08-16 DIAGNOSIS — R627 Adult failure to thrive: Secondary | ICD-10-CM

## 2014-08-16 NOTE — Telephone Encounter (Signed)
Left msg on triage stating pt is going to be moving back to [place] . She is requesting referral to [facility] care she was using them before coming back to [place]. On the order md can put continuing nursing, PT, OT and home health aide...[name]

## 2014-08-16 NOTE — Telephone Encounter (Signed)
[name] with [facility] faxed a suplemental order form for skilled nursing on 08/11/14 to be filled out, and she would like to know the status of her request. Please advise

## 2014-08-16 NOTE — Telephone Encounter (Signed)
[facility] telephone number is [phone] and the Fax number is [phone]

## 2014-08-16 NOTE — Telephone Encounter (Signed)
OK Dx on file

## 2014-08-17 ENCOUNTER — Telehealth: Payer: Self-pay | Admitting: Pulmonary Disease

## 2014-08-17 MED ORDER — GUAIFENESIN-CODEINE 100-10 MG/5ML PO SYRP
5.0000 mL | ORAL_SOLUTION | Freq: Three times a day (TID) | ORAL | Status: DC | PRN
Start: 2014-08-17 — End: 2014-08-29

## 2014-08-17 NOTE — Telephone Encounter (Signed)
Ok to refill 

## 2014-08-17 NOTE — Telephone Encounter (Signed)
rx has been printed out and placed on CY cart and will be mailed to the pt.  Nothing further is needed.

## 2014-08-17 NOTE — Telephone Encounter (Signed)
Called and spoke with pt and she stated that she is out of her cough meds and is requesting that this be refilled--robitissin AC.   Pt is wanting to know if this can be mailed to her at her daughters address. RA is out of the office today and will forward to CY to see if he is ok to refill.  Pt has a pending appt with RA on 08/29/14.  CY please advise. thanks  [address] [place], [place]  [address]  Allergies  Allergen Reactions  . Escitalopram Oxalate Shortness Of Breath, Diarrhea and Nausea And Vomiting    Hot flashes, dizziness  . Indomethacin Other (See Comments)    "made me very very dizzy and made me have vertigo" (08/31/2012)  . Nsaids     Jerking, hot flashes, nausea  . Codeine Nausea And Vomiting    "I can take codeine in my cough medicine" (08/31/2012)  . Sulfonamide Derivatives Other (See Comments)    Drug induced hepatitis in 1989    Current Outpatient Prescriptions on File Prior to Visit  Medication Sig Dispense Refill  . albuterol (PROVENTIL) (2.5 MG/3ML) 0.083% nebulizer solution Take 2.5 mg by nebulization 3 (three) times daily.       Marland Kitchen anastrozole (ARIMIDEX) 1 MG tablet Take 1 tablet (1 mg total) by mouth daily.  90 tablet  3  . apixaban (ELIQUIS) 5 MG TABS tablet Take 5 mg by mouth 2 (two) times daily.       Marland Kitchen atorvastatin (LIPITOR) 20 MG tablet Take 1 tablet (20 mg total) by mouth daily.  90 tablet  3  . busPIRone (BUSPAR) 15 MG tablet Take 1 tablet (15 mg total) by mouth 2 (two) times daily.  180 tablet  3  . clonazePAM (KLONOPIN) 0.5 MG tablet Take 1 tablet (0.5 mg total) by mouth at bedtime.  90 tablet  0  . Dentifrices (BIOTENE DRY MOUTH) GEL Place onto teeth 2 (two) times daily.      Marland Kitchen diltiazem (CARDIZEM CD) 180 MG 24 hr capsule Take by mouth.      . docusate sodium 100 MG CAPS Take 100 mg by mouth daily.  10 capsule  0  . feeding supplement, ENSURE COMPLETE, (ENSURE COMPLETE) LIQD Take 237 mLs by mouth 2 (two) times daily between meals.      .  Fluticasone-Salmeterol (ADVAIR) 250-50 MCG/DOSE AEPB Inhale 1 puff into the lungs 2 (two) times daily.  20 each  0  . furosemide (LASIX) 40 MG tablet Take 1.5 tablets (60 mg total) by mouth daily.  135 tablet  3  . gabapentin (NEURONTIN) 300 MG capsule Take 1 capsule (300 mg total) by mouth at bedtime.  90 capsule  0  . guaiFENesin-codeine (ROBITUSSIN AC) 100-10 MG/5ML syrup Take 5 mLs by mouth 3 (three) times daily as needed for cough.  120 mL  0  . ketoconazole (NIZORAL) 2 % cream Apply 1 application topically 2 (two) times daily.  15 g  0  . loratadine (CLARITIN) 10 MG tablet Take 10 mg by mouth daily.      Marland Kitchen Lysine 500 MG TABS Take by mouth daily.      . metoprolol succinate (TOPROL-XL) 25 MG 24 hr tablet Take 25 mg by mouth daily.      . Multiple Vitamins-Minerals (THERA-TABS M PO) Take by mouth daily.      Marland Kitchen oxybutynin (DITROPAN) 5 MG tablet Take 5 mg by mouth at bedtime.       Marland Kitchen oxyCODONE (OXY  IR/ROXICODONE) 5 MG immediate release tablet Take 1 tablet (5 mg total) by mouth every 4 (four) hours as needed for severe pain (ok to take for ocugh also).  40 tablet  0  . pantoprazole (PROTONIX) 40 MG tablet Take 1 tablet (40 mg total) by mouth 2 (two) times daily.  60 tablet  0  . polyethylene glycol (MIRALAX / GLYCOLAX) packet Take 17 g by mouth daily as needed.  14 each  0  . potassium chloride SA (K-DUR,KLOR-CON) 20 MEQ tablet Take 1 tablet (20 mEq total) by mouth 3 (three) times daily.  90 tablet  3  . sucralfate (CARAFATE) 1 G tablet Take 1 g by mouth 4 (four) times daily.      Marland Kitchen tiotropium (SPIRIVA) 18 MCG inhalation capsule Place 1 capsule (18 mcg total) into inhaler and inhale daily.  14 capsule  0   No current facility-administered medications on file prior to visit.

## 2014-08-21 ENCOUNTER — Telehealth: Payer: Self-pay

## 2014-08-21 NOTE — Telephone Encounter (Signed)
Phone call from physical therapist with [facility] [phone] requesting verbal order to continue home health physical therapy twice this week.

## 2014-08-21 NOTE — Telephone Encounter (Signed)
OK  See Certification Form

## 2014-08-21 NOTE — Telephone Encounter (Signed)
[name] has bee notified of verbal order okay.

## 2014-08-29 ENCOUNTER — Ambulatory Visit (INDEPENDENT_AMBULATORY_CARE_PROVIDER_SITE_OTHER): Payer: Medicare Other | Admitting: Pulmonary Disease

## 2014-08-29 DIAGNOSIS — I2510 Atherosclerotic heart disease of native coronary artery without angina pectoris: Secondary | ICD-10-CM

## 2014-08-29 DIAGNOSIS — J449 Chronic obstructive pulmonary disease, unspecified: Secondary | ICD-10-CM

## 2014-08-29 MED ORDER — PREDNISONE 20 MG PO TABS: ORAL_TABLET | ORAL | Status: AC

## 2014-08-29 MED ORDER — OXYCODONE HCL 5 MG PO TABS: 5.0000 mg | ORAL_TABLET | Freq: Two times a day (BID) | ORAL | Status: AC

## 2014-08-29 MED ORDER — POTASSIUM CHLORIDE CRYS ER 20 MEQ PO TBCR: 20.0000 meq | EXTENDED_RELEASE_TABLET | Freq: Two times a day (BID) | ORAL | Status: AC

## 2014-08-29 NOTE — Assessment & Plan Note (Signed)
Decrease lasix to 40 mg daily Rx for Potassium 20 meq twice daily x 2 months x 1

## 2014-08-29 NOTE — Progress Notes (Signed)
Subjective:    Patient ID: [name], female    DOB: 11-Aug-1941, 73 y.o.   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  12/2009 FEV1 1.0 L (48%)  She smoked for 40 years and quit in 1999. Advair worked better than symbicort.  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  Cardiac cath 08/31/12 with severe stenosis proximal LADs/p drug eluting stent proximal LAD  09/2013 Underwent surgery Left breast for breast cancer, Developed post op infectious complications  01/3328 severe flare of COPD >>mechanical ventilation for several days (Flu + MSSA pneumonia) [facility]. Placed on 24h o2  01/2014 >> mechanical ventilation due to AECOPD in setting of MSSA purulent bronchitis >> [facility] >> son's home  She requested DNI status as well as DNR status.   08/30/14  Chief Complaint  Patient presents with  . Follow-up    f/u resp failure; discuss medications; DOE; needs prescriptions for prednisone, needs 30 day supply of potassium     Was admitted [place] x 7ds - copd flare- bronchitis - abx, course complicated by AF-rvr - dilzem + apixaban Soon after returning home, she had a Fall - wrist fracture  07/2014 She underwent wrist surgery with regional block (after much resistance) She has recovered well & is finally back home, Numerous complaints today She reports a chronic wet cough, clear sputum, - denies heartburn  , reports sinus drip she is able to go without oxygen for 30 mins She wants refill on hydrocodone  - given to her for wrist & earlier for her  shoulder She wants to have shoulder operated on next year Pedal edema has improved   Past Medical History  Diagnosis Date  . Other emphysema   . Unspecified arthropathy, shoulder region   . Gastric ulcer, unspecified as acute or chronic, without mention of hemorrhage, perforation, or obstruction   . Benign paroxysmal positional vertigo   . COPD (chronic obstructive pulmonary disease)   . Diverticula of colon   . Osteopenia   . Personal history of colonic polyps 2007    tubular adenoma high grade dysplasia  . Diverticulitis   . Hyperlipemia   . Fracture of ankle, bimalleolar, right, closed   . Hypoxemia   . C. difficile diarrhea   . Varicose vein   . Asthma   . GERD (gastroesophageal reflux disease)   . Hepatitis 1989  . Arthritis     "both shoulders"   . TIA (transient ischemic attack) 04/2008; 08/21/2012    "slight droop left lower lip after 08/21/12 TIA"  . On home oxygen therapy   . Ischemic cardiomyopathy     a. EF 45-50% 2011. b. EF 35-40% by cath 09/01/12.  . Night terrors, adult     dr Leonie Man referred  for PSG in 2011, and again in 2013   . Breast cancer 04/08/13 bx    left  . Allergy   . CAD (coronary artery disease)     a. ant MI 19 tx with TPA/PTCA. b. Prior stenting procedures including RCA stent,  DES to LCx 04/2011. c. [place] -> DES  to prox LAD 08/2012.   Marland Kitchen Hypertension     TAKES FOR H/O STROKE  . CVA (cerebral vascular accident)   . Stroke, acute, embolic     Nov 4627 , [place]  [facility] , was treated with TPA.   . 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 Path G1 IDC, 1.6 cm, neg margin, neg SLN        Review of Systems neg for any significant sore throat, dysphagia, itching, sneezing, nasal congestion or excess/ purulent secretions, fever, chills, sweats, unintended wt loss, pleuritic or exertional cp, hempoptysis, orthopnea pnd or change  in chronic leg swelling. Also denies presyncope, palpitations, heartburn, abdominal pain, nausea, vomiting, diarrhea or change in bowel or urinary habits, dysuria,hematuria, rash, arthralgias, visual complaints, headache, numbness weakness or ataxia.     Objective:   Physical Exam  Gen. Pleasant, well-nourished, in no distress, normal affect ENT - no lesions, no post nasal drip Neck: No JVD, no thyromegaly, no carotid bruits Lungs: no use of accessory muscles, no dullness to percussion,decreased without rales or rhonchi  Cardiovascular: Rhythm regular, heart sounds  normal, no murmurs or gallops, no peripheral edema Abdomen: soft and non-tender, no hepatosplenomegaly, BS normal. Musculoskeletal: No deformities, no cyanosis or clubbing Neuro:  alert, non focal        Assessment & Plan:

## 2014-08-29 NOTE — Patient Instructions (Signed)
Rx for Oxycodone IR q 12h as needed #60   Decrease lasix to 40 mg daily Rx for Potassium 20 meq twice daily x 2 months x 1 refill Stay on advair, spiriva & oxygen Take decongestant (either mucinexD  Or store brand sudafed -walphed PE x 2 weeks ) for sinus drip Call if cough no better Ok to take generic protonix

## 2014-08-29 NOTE — Assessment & Plan Note (Signed)
Stay on advair, spiriva & oxygen Take decongestant (either mucinexD  Or store brand sudafed -walphed PE x 2 weeks ) for sinus drip Call if cough no better Ok to take generic protonix

## 2014-08-29 NOTE — Assessment & Plan Note (Signed)
Rx for Oxycodone IR q 12h as needed #60   Long conversation about dangers of long term narcotics - I will fill this until she can see her ortho MD on dec4

## 2014-08-30 ENCOUNTER — Telehealth: Payer: Self-pay

## 2014-08-30 ENCOUNTER — Encounter: Payer: Self-pay | Admitting: Pulmonary Disease

## 2014-08-30 NOTE — Telephone Encounter (Signed)
Dr Linna Darner gave verbal okay. Message left on voicemail

## 2014-08-30 NOTE — Telephone Encounter (Signed)
Phone call from occupational therapist with [facility] requesting verbal order to see patient three times a week for four weeks.

## 2014-09-13 DIAGNOSIS — S62101D Fracture of unspecified carpal bone, right wrist, subsequent encounter for fracture with routine healing: Secondary | ICD-10-CM

## 2014-09-13 DIAGNOSIS — J9612 Chronic respiratory failure with hypercapnia: Secondary | ICD-10-CM

## 2014-09-13 DIAGNOSIS — J449 Chronic obstructive pulmonary disease, unspecified: Secondary | ICD-10-CM | POA: Diagnosis not present

## 2014-09-28 ENCOUNTER — Encounter (HOSPITAL_COMMUNITY): Payer: Self-pay | Admitting: Cardiovascular Disease

## 2014-10-05 ENCOUNTER — Telehealth: Payer: Self-pay | Admitting: Pulmonary Disease

## 2014-10-05 MED ORDER — PREDNISONE 10 MG PO TABS: ORAL_TABLET | ORAL | Status: AC

## 2014-10-05 NOTE — Telephone Encounter (Signed)
Prednisone 10 mg tabs  Take 1 tab daily with food x 2 wks, then 1/2 tab daily until OV

## 2014-10-05 NOTE — Telephone Encounter (Signed)
Called spoke with pt. Aware of recs. RX sent in. Nothing further needed 

## 2014-10-05 NOTE — Telephone Encounter (Signed)
Spoke with the pt She is c/o increased SOB for the past 3-4 days  She is SOB with any exertion  She states "it is b/c I have not had any prednisone in 3 wks" She is tearful, and requesting refill on pred b/c she is spending time with her family for the holidays and does not want to be sick  Please advise, thanks!

## 2014-10-16 ENCOUNTER — Telehealth: Payer: Self-pay | Admitting: Oncology

## 2014-10-16 NOTE — Telephone Encounter (Signed)
Lvm advising appt chg from 1/25 to 4/4 with HF @ 12.45pm (April appt with HF ok per md). Also mailed appt calendar.

## 2014-10-26 ENCOUNTER — Ambulatory Visit: Payer: Medicare Other | Admitting: Pulmonary Disease

## 2014-10-31 ENCOUNTER — Telehealth: Payer: Self-pay

## 2014-10-31 NOTE — Telephone Encounter (Signed)
Patient died @ 3 in [place] per [place]

## 2014-11-01 ENCOUNTER — Telehealth: Payer: Self-pay | Admitting: Oncology

## 2014-11-01 NOTE — Telephone Encounter (Signed)
pt daughter Blinda Leatherwood cld stating to CX all pt appts still getting calls about appts-consulted w/Melissa

## 2014-11-02 ENCOUNTER — Encounter (HOSPITAL_COMMUNITY): Payer: Self-pay | Admitting: Cardiology

## 2014-11-13 ENCOUNTER — Ambulatory Visit: Payer: Medicare Other | Admitting: Oncology

## 2014-11-13 ENCOUNTER — Other Ambulatory Visit: Payer: Medicare Other

## 2014-11-20 DEATH — deceased

## 2014-12-07 ENCOUNTER — Ambulatory Visit: Payer: Medicare Other | Admitting: Cardiovascular Disease

## 2015-01-22 ENCOUNTER — Other Ambulatory Visit: Payer: Medicare Other

## 2015-01-22 ENCOUNTER — Ambulatory Visit: Payer: Medicare Other | Admitting: Nurse Practitioner

## 2015-11-06 IMAGING — CR DG CHEST 2V
2 series · 2 of 2 positions shown · non-contrast
Comparison: DG CHEST 1V PORT dated 11/30/2013

CLINICAL DATA: Pneumonia.

EXAM:
CHEST  2 VIEW

[view not recorded (1 of 2)]
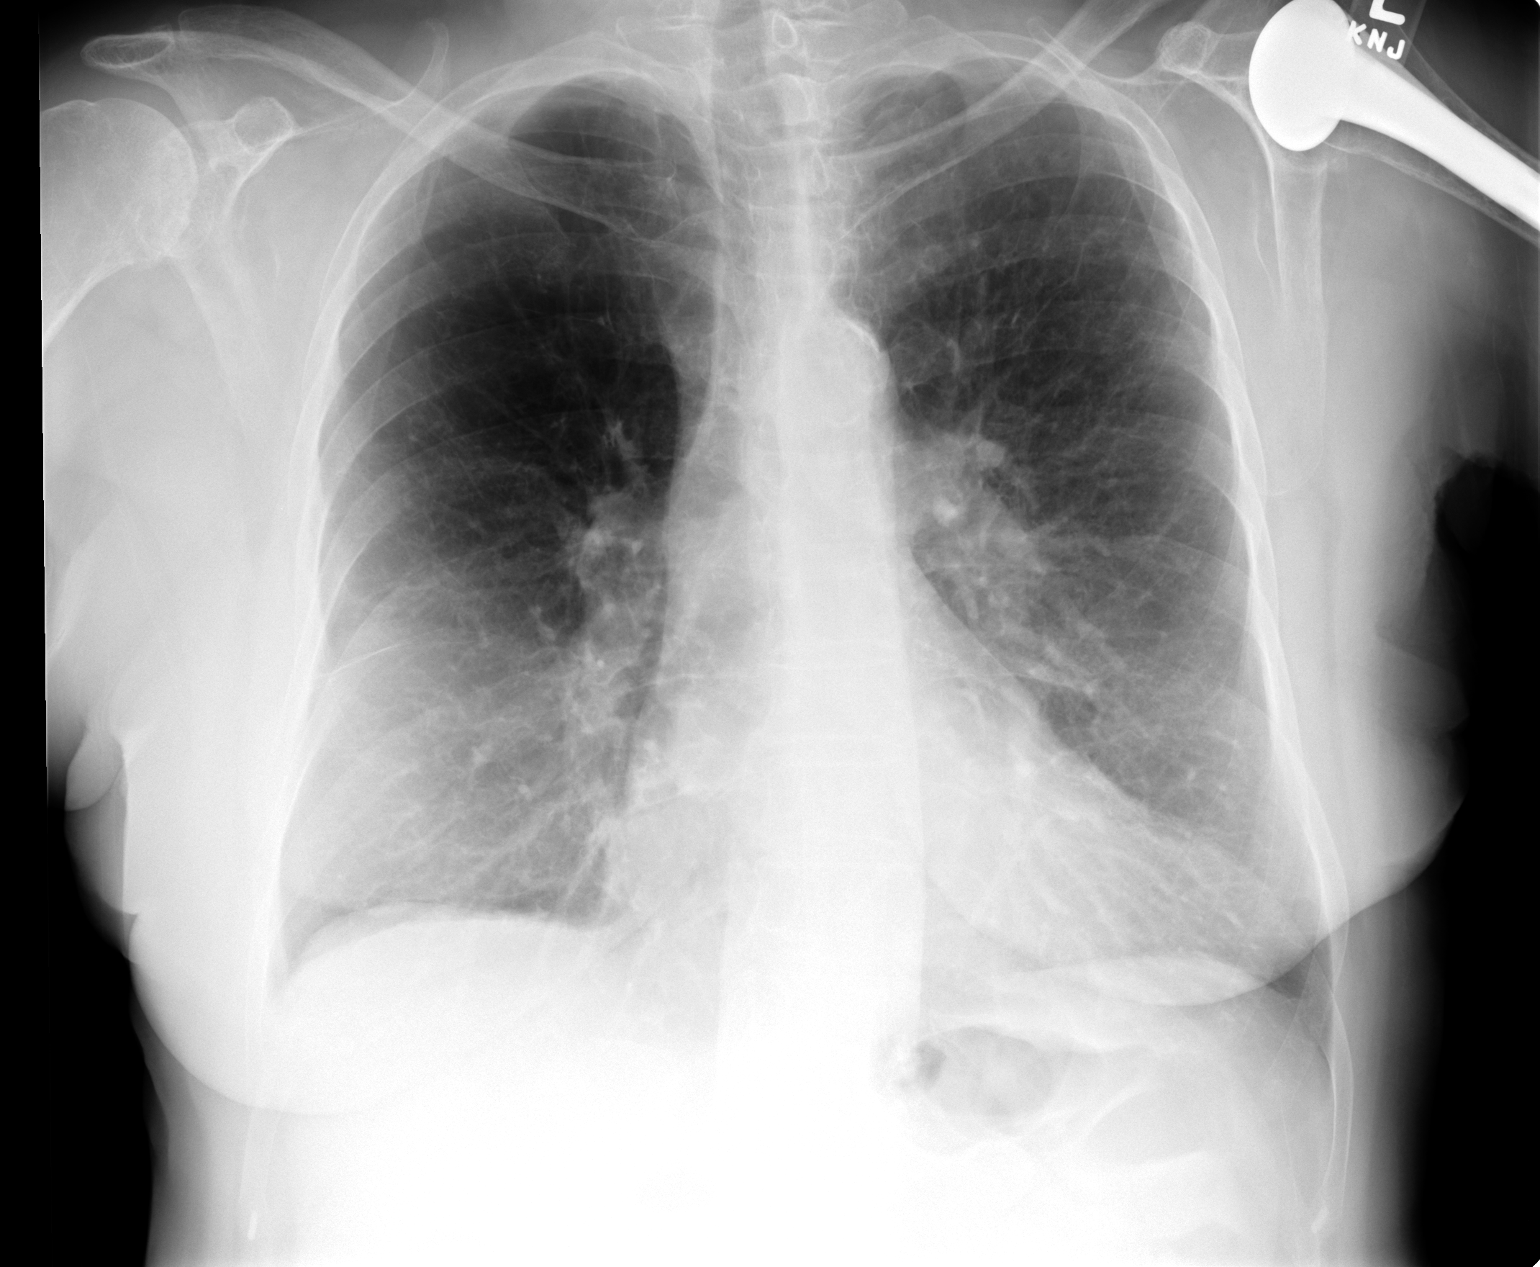

[view not recorded (2 of 2)]
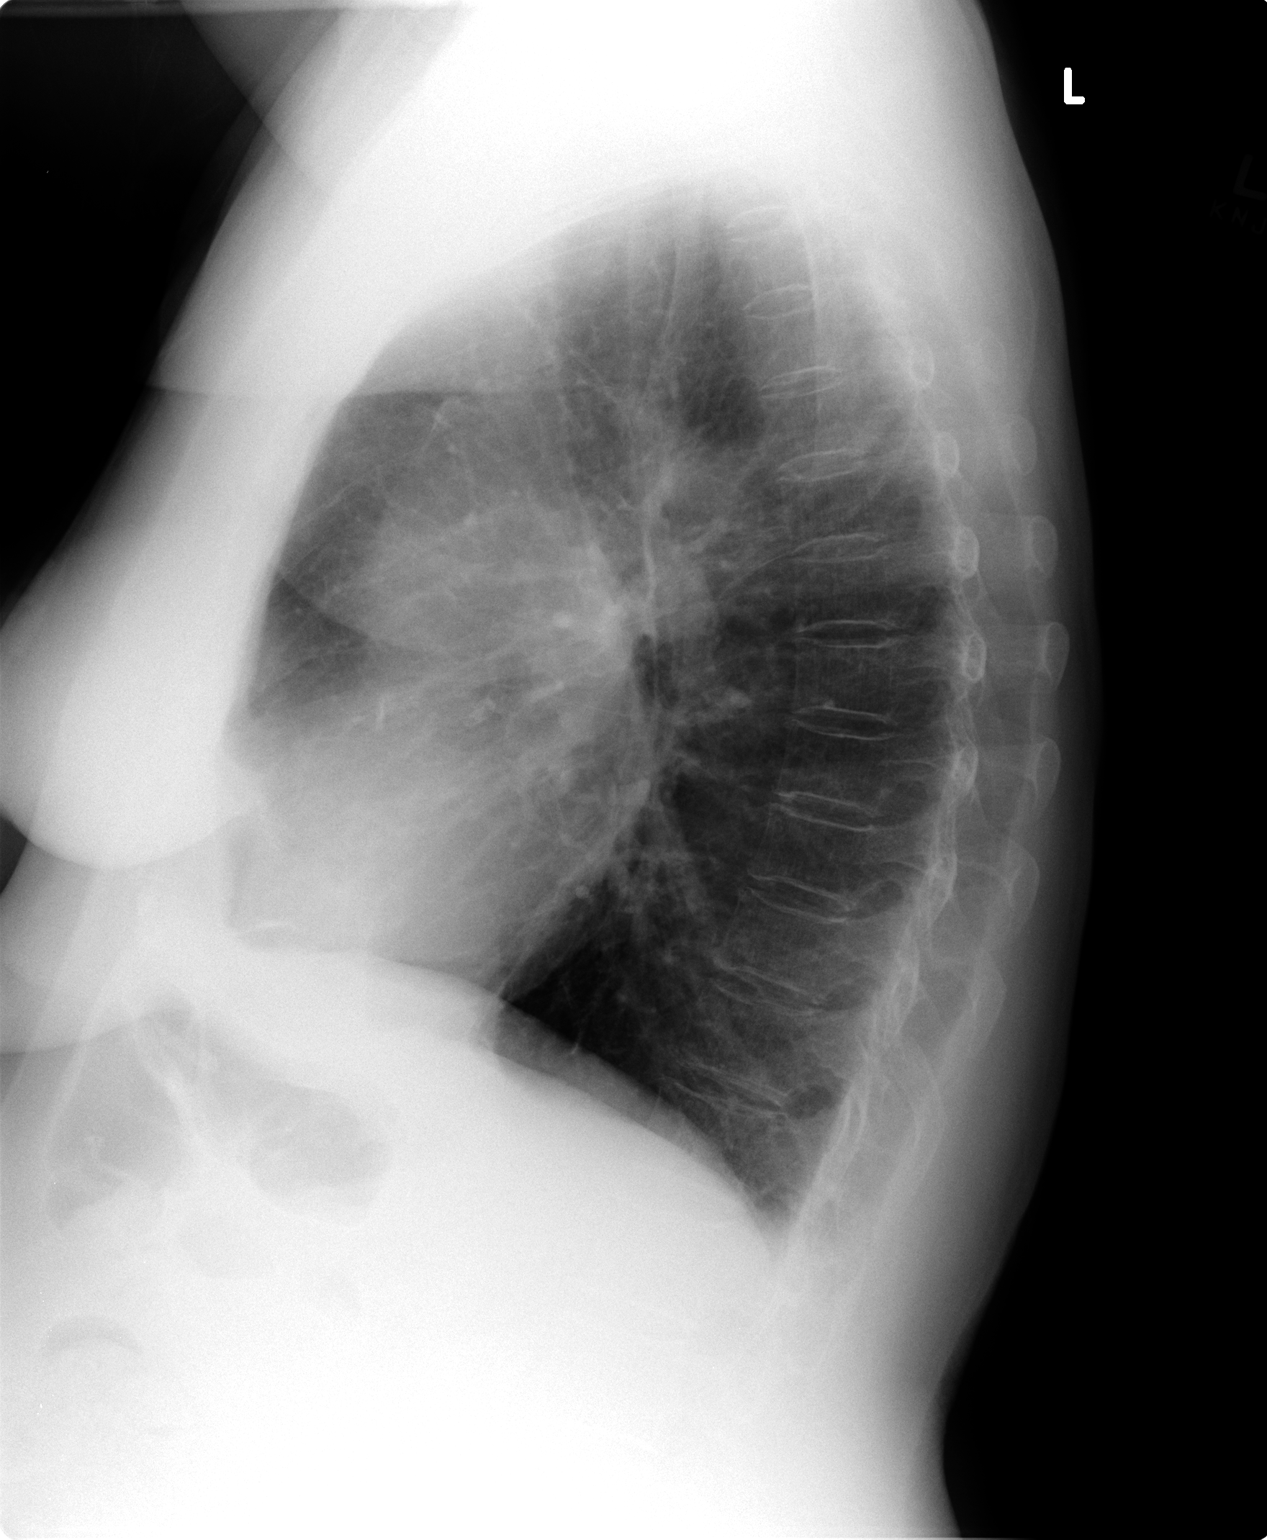

[2 of 2 positions shown; findings below may reference images not displayed]

FINDINGS: Mediastinum and hilar structures are normal. The lungs are clear of
acute infiltrates. No acute cardiopulmonary disease identified. No
pleural effusion or pneumothorax. Left shoulder replacement present.
IMPRESSION: No acute abnormality.

## 2015-11-11 IMAGING — CR DG ABDOMEN 1V
1 series · 1 of 1 positions shown · non-contrast
Comparison: CT abdomen pelvis 03/21/2012

CLINICAL DATA: Orogastric tube placement

EXAM:
ABDOMEN - 1 VIEW

[ap (kub)]
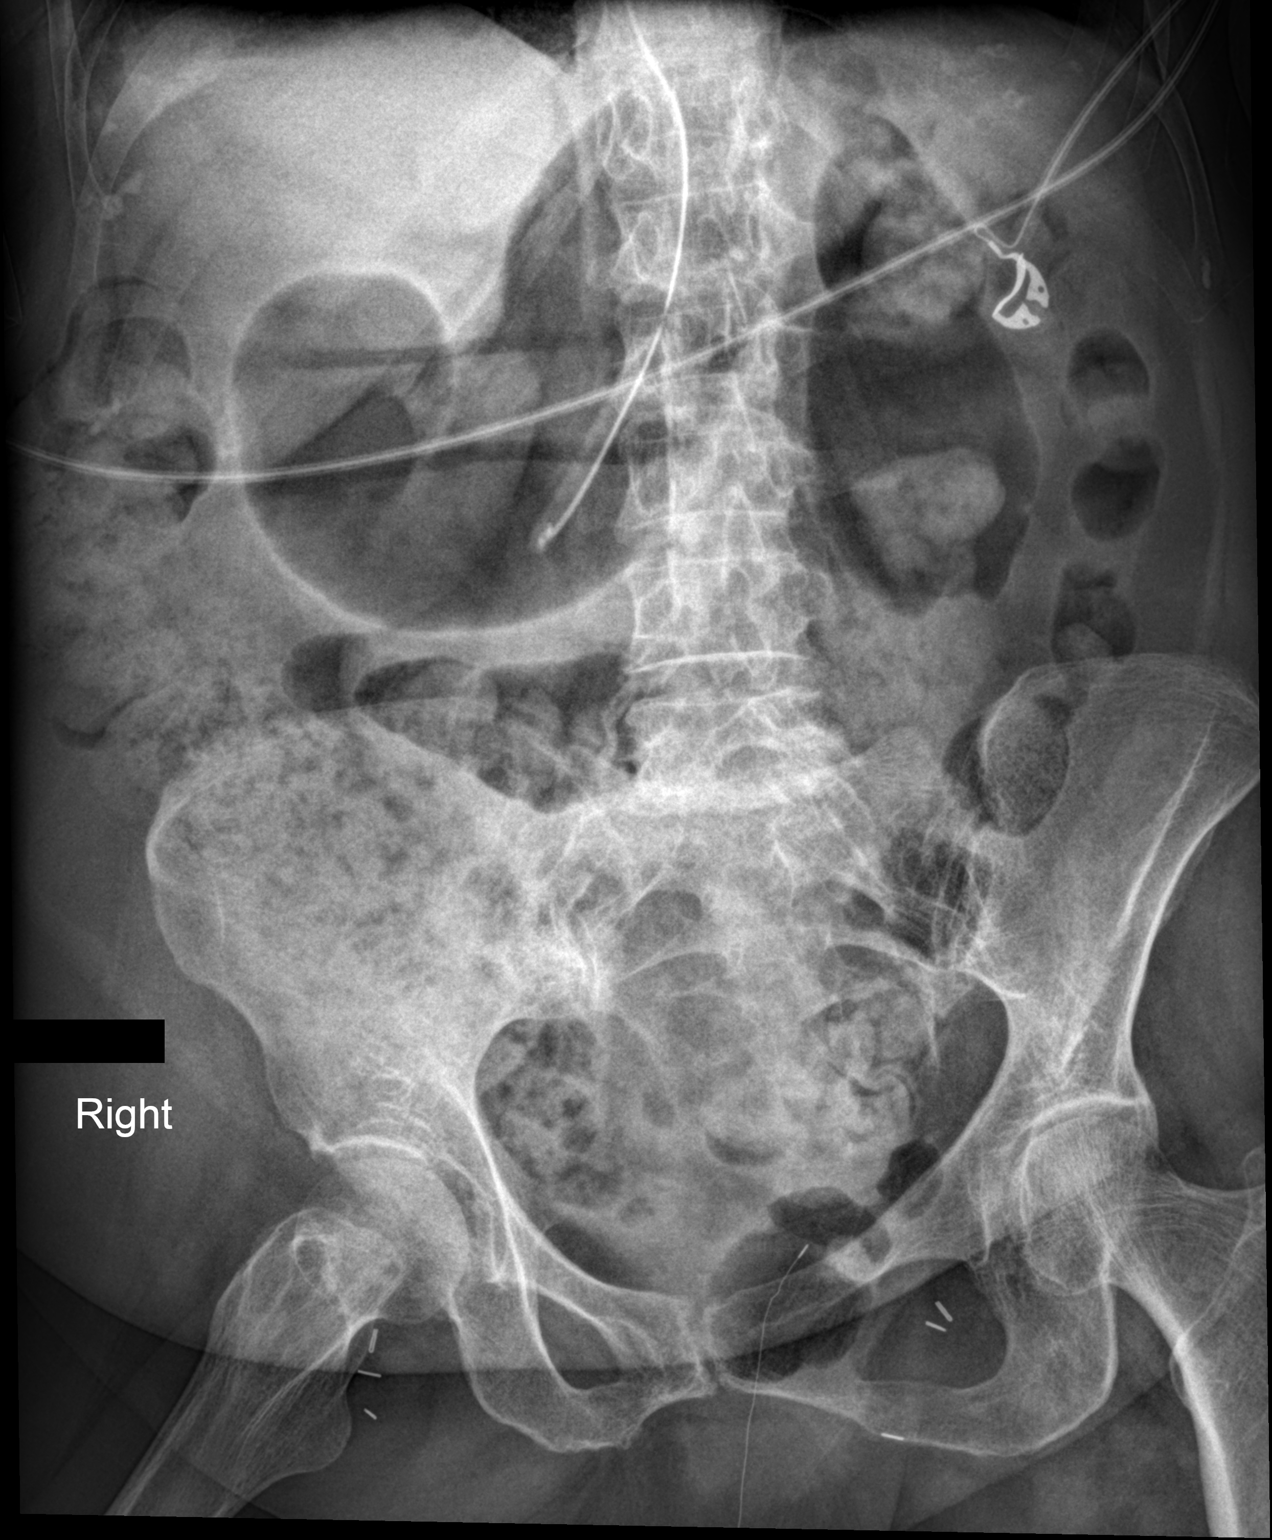

[1 of 1 positions shown; findings below may reference images not displayed]

FINDINGS: An enteric tube terminates in the distal body of the stomach. Mild
gaseous distention of the stomach is noted. Bowel loops are
nondistended. Rectal thermistor is present. Surgical clips noted in
the inguinal regions bilaterally.
IMPRESSION: Enteric tube terminates in the stomach.

## 2015-11-11 IMAGING — CR DG CHEST 1V PORT
1 series · 1 of 1 positions shown · non-contrast
Comparison: DG CHEST 1V PORT dated 01/28/2014;

CLINICAL DATA: Central line placement

EXAM:
PORTABLE CHEST - 1 VIEW

[AP]
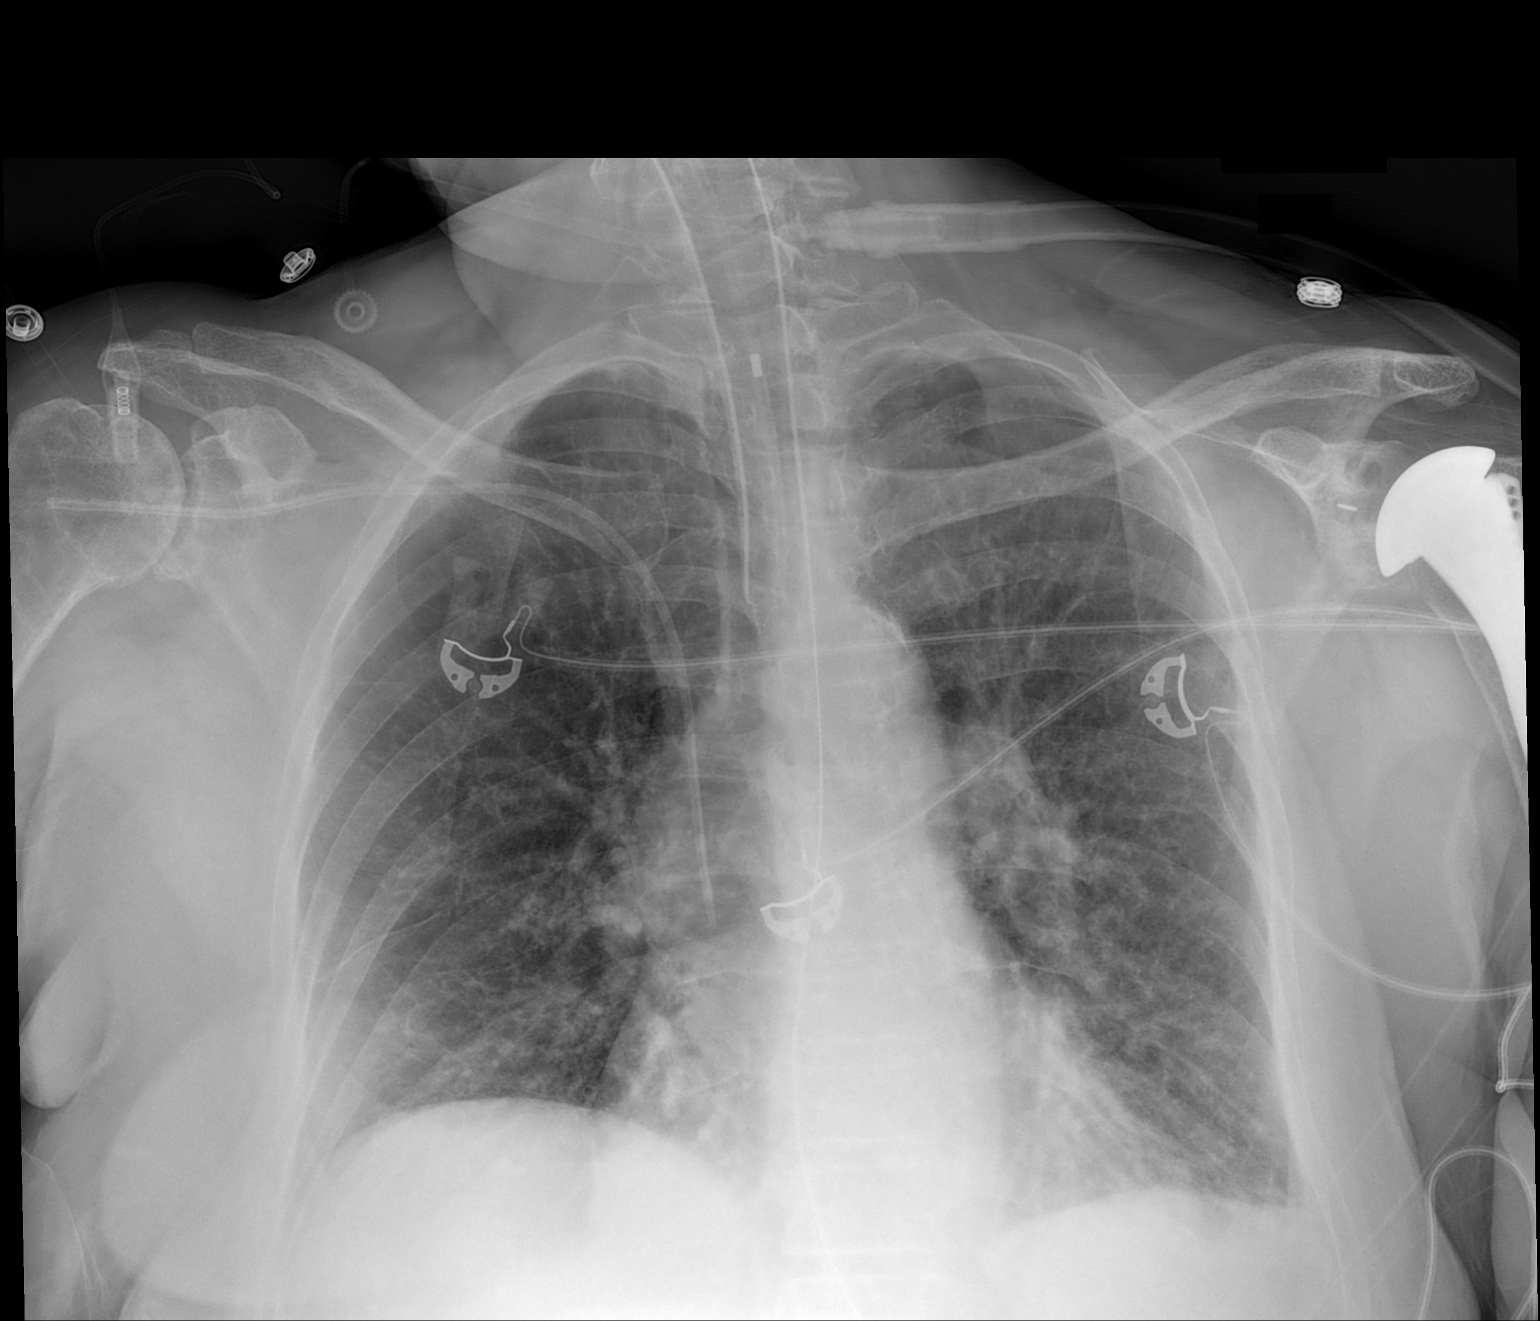

[1 of 1 positions shown; findings below may reference images not displayed]

DG CHEST 1V PORT
dated 01/28/2014; CT ANGIO CHEST W/CM &/OR WO/CM dated 01/25/2014
FINDINGS: An NG tube has been placed in crosses the gastroesophageal junction.
Right subclavian central line has been placed with tip to the
cavoatrial junction. No change endotracheal tube. No pneumothorax.
Mild interstitial change is stable.
IMPRESSION: Central line and NG tube with no pneumothorax.

## 2015-11-12 IMAGING — CR DG CHEST 1V PORT
1 series · 1 of 1 positions shown · non-contrast
Comparison: January 28, 2014.

CLINICAL DATA: Edema.

EXAM:
PORTABLE CHEST - 1 VIEW

[AP]
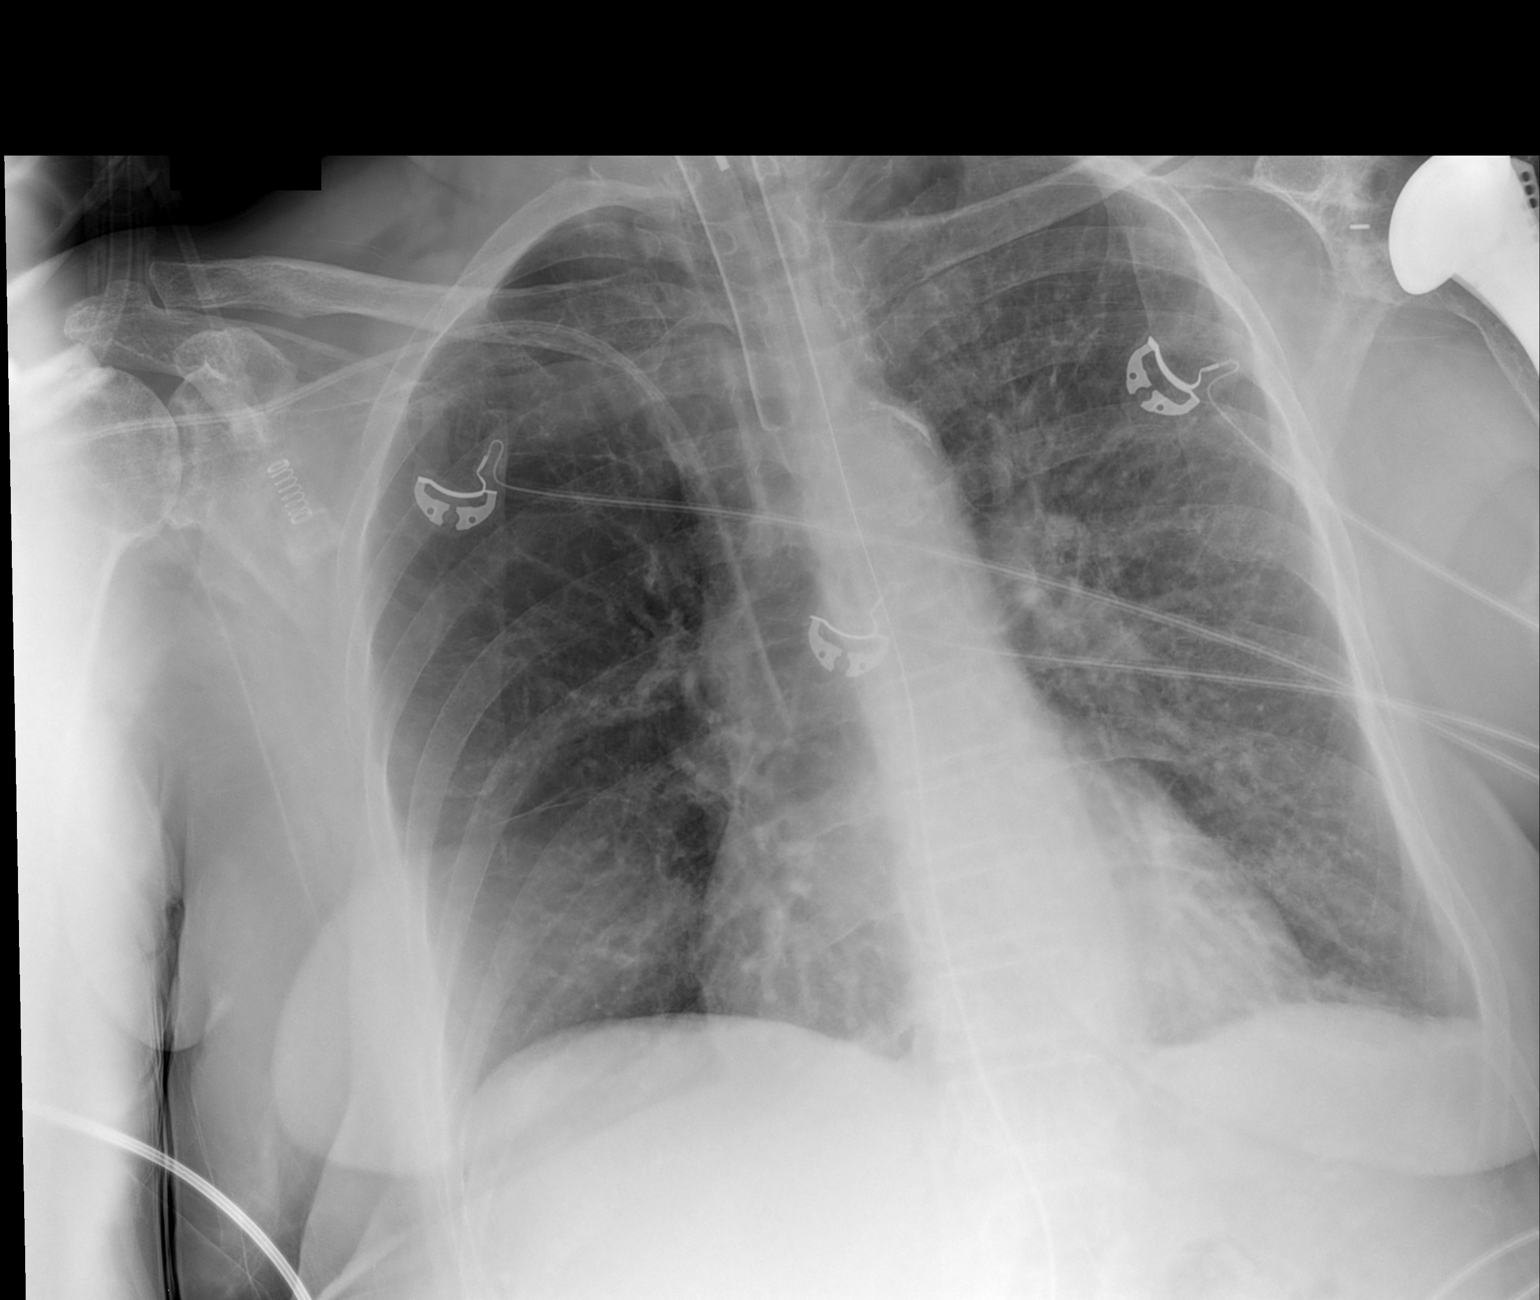

[1 of 1 positions shown; findings below may reference images not displayed]

FINDINGS: Endotracheal and nasogastric tubes are in grossly good position. By
subclavian catheter line is again noted with tip in expected
position of the SVC. Status post left shoulder arthroplasty. No
pneumothorax or pleural effusion is noted. Cardiomediastinal
silhouette is within normal limits. Stable interstitial densities
are noted in the lungs. No acute pulmonary disease is noted.
IMPRESSION: Stable support apparatus. No acute cardiopulmonary abnormality seen.
